# Patient Record
Sex: Male | Born: 1955
Health system: Southern US, Community
[De-identification: ages and names within clinical notes are randomized; demographics above are authoritative.]

## PROBLEM LIST (undated history)

## (undated) DIAGNOSIS — Z298 Encounter for other specified prophylactic measures: Secondary | ICD-10-CM

## (undated) DIAGNOSIS — M5416 Radiculopathy, lumbar region: Secondary | ICD-10-CM

## (undated) DIAGNOSIS — Z944 Liver transplant status: Secondary | ICD-10-CM

## (undated) DIAGNOSIS — R0602 Shortness of breath: Secondary | ICD-10-CM

## (undated) DIAGNOSIS — M5136 Other intervertebral disc degeneration, lumbar region: Secondary | ICD-10-CM

## (undated) DIAGNOSIS — E119 Type 2 diabetes mellitus without complications: Secondary | ICD-10-CM

## (undated) DIAGNOSIS — M4326 Fusion of spine, lumbar region: Secondary | ICD-10-CM

## (undated) DIAGNOSIS — G894 Chronic pain syndrome: Secondary | ICD-10-CM

## (undated) DIAGNOSIS — I10 Essential (primary) hypertension: Secondary | ICD-10-CM

## (undated) DIAGNOSIS — R609 Edema, unspecified: Secondary | ICD-10-CM

## (undated) DIAGNOSIS — E663 Overweight: Secondary | ICD-10-CM

## (undated) DIAGNOSIS — C220 Liver cell carcinoma: Secondary | ICD-10-CM

## (undated) DIAGNOSIS — E876 Hypokalemia: Secondary | ICD-10-CM

## (undated) DIAGNOSIS — H9190 Unspecified hearing loss, unspecified ear: Secondary | ICD-10-CM

## (undated) DIAGNOSIS — K769 Liver disease, unspecified: Secondary | ICD-10-CM

## (undated) DIAGNOSIS — M5410 Radiculopathy, site unspecified: Secondary | ICD-10-CM

## (undated) HISTORY — DX: Chronic pain syndrome: G89.4

## (undated) HISTORY — DX: Type 2 diabetes mellitus without complications: E11.9

## (undated) HISTORY — PX: OTHER SURGICAL HISTORY: SHX169

## (undated) HISTORY — PX: LIVER SURGERY: SHX698

## (undated) HISTORY — PX: HERNIA REPAIR: SHX51

## (undated) HISTORY — PX: EYE SURGERY: SHX253

## (undated) HISTORY — DX: Fusion of spine, lumbar region: M43.26

## (undated) HISTORY — DX: Liver transplant status: Z94.4

## (undated) HISTORY — DX: Essential (primary) hypertension: I10

## (undated) HISTORY — DX: Hypokalemia: E87.6

## (undated) HISTORY — DX: Radiculopathy, site unspecified: M54.10

## (undated) HISTORY — DX: Encounter for other specified prophylactic measures: Z29.8

## (undated) HISTORY — DX: Shortness of breath: R06.02

## (undated) HISTORY — DX: Radiculopathy, lumbar region: M54.16

## (undated) HISTORY — PX: LIVER TRANSPLANT: SHX410

## (undated) HISTORY — DX: Liver cell carcinoma: C22.0

## (undated) HISTORY — PX: TONSILLECTOMY: SUR1361

## (undated) HISTORY — DX: Overweight: E66.3

## (undated) HISTORY — DX: Other intervertebral disc degeneration, lumbar region: M51.36

## (undated) HISTORY — DX: Edema, unspecified: R60.9

## (undated) HISTORY — DX: Unspecified hearing loss, unspecified ear: H91.90

## (undated) HISTORY — PX: BACK SURGERY: SHX140

## (undated) HISTORY — DX: Liver disease, unspecified: K76.9

---

## 2003-07-30 ENCOUNTER — Ambulatory Visit (HOSPITAL_COMMUNITY): Admission: RE | Admit: 2003-07-30 | Discharge: 2003-07-30 | Payer: Self-pay | Admitting: Orthopedic Surgery

## 2003-07-30 ENCOUNTER — Encounter (INDEPENDENT_AMBULATORY_CARE_PROVIDER_SITE_OTHER): Payer: Self-pay | Admitting: Specialist

## 2003-07-30 ENCOUNTER — Ambulatory Visit (HOSPITAL_BASED_OUTPATIENT_CLINIC_OR_DEPARTMENT_OTHER): Admission: RE | Admit: 2003-07-30 | Discharge: 2003-07-30 | Payer: Self-pay | Admitting: Orthopedic Surgery

## 2007-09-20 ENCOUNTER — Other Ambulatory Visit: Admission: RE | Admit: 2007-09-20 | Discharge: 2007-09-20 | Payer: Self-pay | Admitting: Oncology

## 2007-09-20 ENCOUNTER — Encounter (INDEPENDENT_AMBULATORY_CARE_PROVIDER_SITE_OTHER): Payer: Self-pay | Admitting: Oncology

## 2010-07-02 NOTE — Op Note (Signed)
NAME:  Gregory Tanner, HASMAN NO.:  [id number]   MEDICAL RECORD NO.:  [id number]                   PATIENT TYPE:  AMB   LOCATION:  [facility]                                  FACILITY:  [facility]   PHYSICIAN:  Kathalene Frames. Mayer Camel, M.D.                DATE OF BIRTH:  1955-03-19   DATE OF PROCEDURE:  07/30/2003  DATE OF DISCHARGE:                                 OPERATIVE REPORT   PREOPERATIVE DIAGNOSES:  Right knee chondromalacia, lateral meniscal tear  and Baker's cyst.   POSTOPERATIVE DIAGNOSES:  Right knee chondromalacia, lateral meniscal tear  and Baker's cyst.   OPERATION PERFORMED:  Right knee arthroscopic debridement of chondromalacia  from the patella, grade 3 flap tears, trochlea grade 3 flap tears, medial  femoral condyle grade 3 flap tears and debridement of lateral meniscal tear.  This was done in a supine position.  He was then placed prone and we did an  open excision of the Baker's cyst.   SURGEON:  Kathalene Frames. Mayer Camel, M.D.   ASSISTANT:  1. Jodi Geralds, PA student.  2. Lowell Guitar. Mancel Bale, P.A.-C.   ANESTHESIA:  General endotracheal.   ESTIMATED BLOOD LOSS:  Minimal.   FLUIDS REPLACED:  1L crystalloids.   DRAINS:  None.   TOURNIQUET TIME:  30 minutes.   INDICATIONS FOR PROCEDURE:  The patient is a 55 year old man with a rather  large Baker's cyst tracking half way down his calf on MRI scan that has been  symptomatic for pressure and pain for over a year.  He also has some  crepitus and pain in his knee and the MRI scan did show some chondromalacia.  He has failed conservative treatment and desires elective debridement of the  knee and removal of the Baker's cyst open.  He is well aware of the risks  and benefits of surgery.   DESCRIPTION OF PROCEDURE:  The patient was identified by arm band and taken  to the operating room at [facility].  Appropriate  anesthetic monitors were attached.  General endotracheal was induced with  the  patient in the supine position.  Lateral post applied to the table and  the right lower extremity prepped and draped in the usual sterile fashion  from the ankle to the midthigh where a tourniquet was placed.  We began the  procedure by making a standard set of portals inferomedial and inferolateral  to the patella allowing introduction of the arthroscope through the  inferolateral portal and the outflow through the inferomedial portal.  The  pressure was set between 60 and 80 mmHg and diagnostic arthroscopy revealed  grade 3 chondromalacia of the patella with flap tears and trochlea.  It was  debrided with a 3.5 Gator sucker shaver as was chondromalacia of the medial  femoral condyle, likewise fairly global grade 3.  The medial meniscus was  thoroughly probed, no significant tears were noted.  Some small incidental  tears were debrided.  The ACL and the PCL were intact.  Moving to the  lateral side, the patient did have complex tearing of the posterior horn of  the lateral meniscus and this was debrided.  The articular cartilage on the  lateral side was in relatively good shape.  Satisfied with the decompression  of the knee, the knee was irrigated out with normal saline solution and the  arthroscopic instruments were removed.  A temporary dressing of 4 x 4s and  an Ace wrap was then applied.  The patient was undraped, transferred to a  gurney and then placed in a prone position with the tourniquet still in  place and the right lower extremity prepped and draped in the usual sterile  fashion from the ankle to the tourniquet.  The limb was wrapped with an  Esmarch bandage and the tourniquet inflated to 300 mmHg.  We then made a  hockey stick shaped incision starting out a the knee flexion crease  centrally going medially and then distally following the tract of the  Baker's cyst that was clearly seen on the MRI scan.  The incision was  accurate.  We came down right on top of the Baker's cyst  as we cut down  through the subcutaneous tissue and the fascia that invested the medial  gastroc.  We then carefully dissected around the cyst although it did  rupture in a couple of places during the dissection and traced it back to  the hiatus between the semimembranosus and the medial  head of the gastroc.  It was then cut off at its origin and sent as a specimen because there was  some particulate matter inside of the Baker's cyst.  Distally the cyst was  also decompressed and all debris was removed.  The wound was then irrigated  out with normal saline solution and the interval between the semimembranosus  and the posterior capsule was closed with 2-0 Vicryl suture sealing off the  port that the Baker's cyst came out of.  The subcutaneous tissue  was then  closed with running 2-0 Vicryl suture, the skin with running interlocking 3-  0 nylon suture and a dressing of Xeroform, 4 x 4 dressing sponges, Webril  and an Ace wrap applied.  The patient was then rolled supine, awakened and  taken to recovery room without difficulty.   Subcutaneous tissues were closed with 0 and 2-0 undyed Vicryl suture and the  skin with skin staples.  A dressing of Xeroform, 4 x 4 dressing sponges,  Webril and Ace wrap was applied.  The patient was awakened and taken to the  recovery room without difficulty.                                               Kathalene Frames. Mayer Camel, M.D.    Alphonsus Sias  D:  07/30/2003  T:  07/30/2003  Job:  [id number]

## 2010-12-27 DIAGNOSIS — C220 Liver cell carcinoma: Secondary | ICD-10-CM

## 2010-12-27 DIAGNOSIS — E119 Type 2 diabetes mellitus without complications: Secondary | ICD-10-CM

## 2010-12-27 DIAGNOSIS — I10 Essential (primary) hypertension: Secondary | ICD-10-CM

## 2010-12-27 HISTORY — DX: Type 2 diabetes mellitus without complications: E11.9

## 2010-12-27 HISTORY — DX: Liver cell carcinoma: C22.0

## 2010-12-27 HISTORY — DX: Essential (primary) hypertension: I10

## 2011-07-20 DIAGNOSIS — Z944 Liver transplant status: Secondary | ICD-10-CM | POA: Insufficient documentation

## 2011-07-20 HISTORY — DX: Liver transplant status: Z94.4

## 2011-10-10 DIAGNOSIS — Z7189 Other specified counseling: Secondary | ICD-10-CM | POA: Insufficient documentation

## 2011-10-10 DIAGNOSIS — E663 Overweight: Secondary | ICD-10-CM | POA: Insufficient documentation

## 2011-10-10 DIAGNOSIS — Z298 Encounter for other specified prophylactic measures: Secondary | ICD-10-CM | POA: Insufficient documentation

## 2011-10-10 HISTORY — DX: Overweight: E66.3

## 2011-10-10 HISTORY — DX: Encounter for other specified prophylactic measures: Z29.8

## 2012-07-26 DIAGNOSIS — M51369 Other intervertebral disc degeneration, lumbar region without mention of lumbar back pain or lower extremity pain: Secondary | ICD-10-CM | POA: Insufficient documentation

## 2012-07-26 DIAGNOSIS — M5136 Other intervertebral disc degeneration, lumbar region: Secondary | ICD-10-CM

## 2012-07-26 HISTORY — DX: Other intervertebral disc degeneration, lumbar region: M51.36

## 2012-10-10 DIAGNOSIS — M5416 Radiculopathy, lumbar region: Secondary | ICD-10-CM | POA: Insufficient documentation

## 2012-10-10 HISTORY — DX: Radiculopathy, lumbar region: M54.16

## 2013-07-05 DIAGNOSIS — M51369 Other intervertebral disc degeneration, lumbar region without mention of lumbar back pain or lower extremity pain: Secondary | ICD-10-CM

## 2013-07-05 DIAGNOSIS — M5136 Other intervertebral disc degeneration, lumbar region: Secondary | ICD-10-CM | POA: Insufficient documentation

## 2013-07-05 HISTORY — DX: Other intervertebral disc degeneration, lumbar region: M51.36

## 2013-07-05 HISTORY — DX: Other intervertebral disc degeneration, lumbar region without mention of lumbar back pain or lower extremity pain: M51.369

## 2013-08-13 ENCOUNTER — Encounter: Payer: Self-pay | Admitting: Podiatrist

## 2013-08-13 ENCOUNTER — Ambulatory Visit (INDEPENDENT_AMBULATORY_CARE_PROVIDER_SITE_OTHER): Payer: Medicare Other | Admitting: Podiatrist

## 2013-08-13 VITALS — BP 127/76 | HR 76 | Resp 18

## 2013-08-13 DIAGNOSIS — L600 Ingrowing nail: Secondary | ICD-10-CM

## 2013-08-13 MED ORDER — CEPHALEXIN 500 MG PO CAPS: 500.0000 mg | ORAL_CAPSULE | Freq: Three times a day (TID) | ORAL | Status: DC

## 2013-08-13 NOTE — Patient Instructions (Signed)
Soak Instructions    THE DAY AFTER THE PROCEDURE  Place 1/4 cup of epsom salts in a quart of warm tap water.  Submerge your foot or feet with outer bandage intact for the initial soak; this will allow the bandage to become moist and wet for easy lift off.  Once you remove your bandage, continue to soak in the solution for 20 minutes.  This soak should be done twice a day.  Next, remove your foot or feet from solution, blot dry the affected area and cover.  You may use a band aid large enough to cover the area or use gauze and tape.  Apply other medications to the area as directed by the doctor such as polysporin neosporin.  IF YOUR SKIN BECOMES IRRITATED WHILE USING THESE INSTRUCTIONS, IT IS OKAY TO SWITCH TO  WHITE VINEGAR AND WATER. Or you may use antibacterial soap and water to keep the toe clean     ANTIBACTERIAL SOAP INSTRUCTIONS  THE DAY AFTER PROCEDURE   For the first dressing change (soak) Place 3-4 drops of antibacterial liquid soap in a quart of warm tap water.  Submerge foot into water for 20 minutes.  If bandage was applied after your procedure, leave on to allow for easy lift off, then remove and continue with soak for the remaining time.  Next, blot area dry with a soft cloth and apply antibiotic ointment such as polysporin, neosporin, or triple antibiotic ointment.  You may then switch to the instructions below    Shower as usual. Before getting out, place a drop of antibacterial liquid soap (Dial) on a wet, clean washcloth.  Gently wipe washcloth over affected area.  Afterward, rinse the area with warm water.  Blot the area dry with a soft cloth and cover with antibiotic ointment (neosporin, polysporin, bacitracin) and band aid or gauze and tape    Long Term Care Instructions-Post Nail Surgery  You have had your ingrown toenail and root treated with a chemical.  This chemical causes a burn that will drain and ooze like a blister.  1-2 weeks after the procedure you may  leave the area open to air at night to help dry it up.  During the day,  It is important to keep this area clean and covered until the toe dries out and forms a scab. Once the scab forms you no longer need to soak or apply a dressing.  If at any time you experience an increase in pain, redness, swelling, or drainage, you should contact the office as soon as possible.

## 2013-08-13 NOTE — Progress Notes (Signed)
   Subjective:    Patient ID: Gregory Tanner, male    DOB: [birth year], 58 y.o.   MRN: [id number]  HPI I HAVE SOME INGROWNS ON BOTH BIG TOENAILS AND THE ONE ON THE RIGHT I MAY HAVE HIT IT ON SOMETHING DUE TO IT BEING THICK AND DISCOLORED AND THE LEFT BIG TOENAIL MAY BE INGROWN AND HAS BEEN GOING ON FOR A WHILE AND NERVE DAMAGE IN MY LEFT LEG AND SOME NUMBNESS AND I HAVE HAD A LIVER TRANSPLANT IN 2013 AND THERE IS NO DRAINING AND HAVE SOAKED IN EPSOM SALT    Review of Systems  HENT:       RINGING IN EARS   Cardiovascular: Positive for leg swelling.  Musculoskeletal: Positive for back pain.  Neurological: Positive for numbness.  All other systems reviewed and are negative.      Objective:   Physical Exam Patient is awake, alert, and oriented x 3.  In no acute distress.  Vascular status is intact with palpable pedal pulses at 2/4 DP and PT bilateral and capillary refill time within normal limits. Neurological sensation is also intact bilaterally via Semmes Weinstein monofilament at 5/5 sites. Light touch, vibratory sensation, Achilles tendon reflex is intact. Dermatological exam reveals skin color, turger and texture as normal. No open lesions present.  Musculature intact with dorsiflexion, plantarflexion, inversion, eversion.  Right hallux nail is ingrown on the lateral side.  Medial side of the nail is severely thick and has a tubular tooth like appearance to the toenail when excised.  Left hallus nail is slightly ingrown on the medial nail border.  Mildly tender today as well.     Assessment & Plan:  Ingrowing right great toenail,  Left slight paronychia medial side.  Plan:  Treatment options and alternatives discussed.  Recommended permanent phenol matrixectomy and patient agreed.  Right hallux was prepped with alcohol and a 1 to 1 mix of 0.5% marcaine plain and 2% lidocaine plain was administered in a digital block fashion.  The toe was then prepped with betadine solution and  exsanguinated.  The offending nail border was then excised and matrix tissue exposed-- lateral side of the nail is very thick and in a tubular pattern of growth.  Phenol was then applied to the matrix tissue followed by an alcohol wash.  Antibiotic ointment and a dry sterile dressing was applied.  The patient was dispensed instructions for aftercare and antibiotics.  Discussed the lateral part of the nail may grow back some as it is so thick the phenol may not have treated it completely after this procedure.  If that part of the nail starts to grow back he will call.

## 2013-08-14 ENCOUNTER — Ambulatory Visit: Payer: Self-pay

## 2014-02-21 DIAGNOSIS — M4326 Fusion of spine, lumbar region: Secondary | ICD-10-CM | POA: Insufficient documentation

## 2014-02-21 HISTORY — DX: Fusion of spine, lumbar region: M43.26

## 2014-06-05 ENCOUNTER — Encounter: Payer: Self-pay | Admitting: Podiatrist

## 2014-06-05 ENCOUNTER — Ambulatory Visit (INDEPENDENT_AMBULATORY_CARE_PROVIDER_SITE_OTHER): Payer: Medicare Other | Admitting: Podiatrist

## 2014-06-05 VITALS — BP 131/69 | HR 72 | Resp 12

## 2014-06-05 DIAGNOSIS — L600 Ingrowing nail: Secondary | ICD-10-CM

## 2014-06-05 MED ORDER — CEPHALEXIN 500 MG PO CAPS: 500.0000 mg | ORAL_CAPSULE | Freq: Three times a day (TID) | ORAL | Status: DC

## 2014-06-05 NOTE — Patient Instructions (Signed)
Soak Instructions    THE DAY AFTER THE PROCEDURE  Place 1/4 cup of epsom salts in a quart of warm tap water.  Submerge your foot or feet with outer bandage intact for the initial soak; this will allow the bandage to become moist and wet for easy lift off.  Once you remove your bandage, continue to soak in the solution for 20 minutes.  This soak should be done twice a day.  Next, remove your foot or feet from solution, blot dry the affected area and cover.   Apply polysporin then You may use a band aid large enough to cover the area or use gauze and tape.   After the first 3 days of epsom salt soaks, you may switch to the directions below.    ANTIBACTERIAL SOAP INSTRUCTIONS   TShower as usual. Before getting out, place a drop of antibacterial liquid soap (Dial) on a wet, clean washcloth.  Gently wipe washcloth over affected area.  Afterward, rinse the area with warm water.  Blot the area dry with a soft cloth and cover with antibiotic ointment (neosporin, polysporin, bacitracin) and band aid or gauze and tape    Long Term Care Instructions-Post Nail Surgery  You have had your ingrown toenail and root treated with a chemical.  This chemical causes a burn that will drain and ooze like a blister.  1-2 weeks after the procedure you may leave the area open to air at night to help dry it up.  During the day,  It is important to keep this area clean and covered until the toe dries out and forms a scab. Once the scab forms you no longer need to soak or apply a dressing.  If at any time you experience an increase in pain, redness, swelling, or drainage, you should contact the office as soon as possible.

## 2014-06-05 NOTE — Progress Notes (Signed)
   Subjective:    Patient ID: Gregory Tanner, male    DOB: 22-Oct-1955, 59 y.o.   MRN: [id number]  HPI PT STATED RT FOOT 1ST AND LT FOOT 2ND TOENAIL BEEN SORE FOR LESS THAN 1 YEAR. THE TOENAILS ARE WORSE AND GET AGGRAVATED BY PRESSURE. TRIED TO SOAK WITH EPSON SALT.   Review of Systems  Cardiovascular: Positive for leg swelling.  All other systems reviewed and are negative.      Objective:   Physical Exam  Neurovascular status is intact and unchanged. Pedal pulses are palpable at 2/4 DP and PT bilateral neurological sensation also and continues to be intact with some evidence of peripheral neuropathy at the very distal digits. Patient's right hallux nail on the lateral nail border is inflamed and irritated. Medial nail border appears to have healed well from the previous permanent phenol matrixectomy. Left second toe lateral nail border is inflamed and irritated as well. Sign of redness and inflammation and ingrown toenail deformity is noted.    Assessment & Plan:  Ingrown toenail right first lateral nail border; left second lateral nail border  Plan:Treatment options and alternatives discussed.  Recommended permanent phenol matrixectomy and patient agreed.  right hallux and left second toe was prepped with alcohol and a 1 to 1 mix of 0.5% marcaine plain and 2% lidocaine plain was administered in a digital block fashion.  The toe was then prepped with betadine solution and exsanguinated.  The offending nail border was then excised and matrix tissue exposed.  Phenol was then applied to the matrix tissue followed by an alcohol wash.  Antibiotic ointment and a dry sterile dressing was applied.  The patient was dispensed instructions for aftercare.  Epsom salt soaks as well as antibiotics soak instructions were dispensed and a prescription for Keflex was also called in. He'll call if any concerns arise otherwise he'll be seen back as needed.

## 2014-06-12 ENCOUNTER — Encounter: Payer: Self-pay | Admitting: Podiatrist

## 2014-06-19 ENCOUNTER — Encounter: Payer: Self-pay | Admitting: Podiatrist

## 2014-06-19 ENCOUNTER — Ambulatory Visit (INDEPENDENT_AMBULATORY_CARE_PROVIDER_SITE_OTHER): Payer: Medicare Other | Admitting: Podiatrist

## 2014-06-19 ENCOUNTER — Telehealth: Payer: Self-pay | Admitting: Podiatrist

## 2014-06-19 VITALS — BP 135/66 | HR 76 | Resp 18

## 2014-06-19 DIAGNOSIS — L600 Ingrowing nail: Secondary | ICD-10-CM

## 2014-06-19 NOTE — Telephone Encounter (Signed)
I scheduled him to come in at 1:15 today Dr. Valentina Lucks.

## 2014-06-19 NOTE — Telephone Encounter (Signed)
Make him an appt.  I'd like to see his toe.  Thanks!  E

## 2014-06-19 NOTE — Progress Notes (Signed)
Subjective: Patient presents today for follow-up of left second toenail status post permanent phenol matrixectomy on 06/05/2014. He states overall it is doing pretty good however it still bleeding on the lateral side. Denies any pain or drainage to the toe. Denies any pus or purulence.  Objective: Neurovascular status is intact and unchanged with some peripheral neuropathy noted distally. Lateral aspect of the left second toenail is a very superficial area of redness present no active bleeding is noted. No pus or purulence is seen. No redness or swelling is noted. Normal postoperative appearance status post phenol matrixectomy is noted. Right hallux however does have a nail spicule on the medial aspect which was unresolved.  Assessment: Status post phenol matrixectomy  Plan: Applied salinocaine to lateral aspect left second toe and recommended that he keep open to air and allow the toe to dry out. Also removed the spicule on the medial side of the right hallux without complication. He'll call if any concerns or problems arise otherwise he'll be seen back as needed.

## 2014-06-19 NOTE — Telephone Encounter (Signed)
Patient stated he had an ingrown removed a few weeks ago and that it is still seeping blood. He also stated that it has never started to form a scab. Patient is diabetic.

## 2014-09-23 ENCOUNTER — Other Ambulatory Visit: Payer: Self-pay | Admitting: *Deleted

## 2014-09-23 DIAGNOSIS — I83893 Varicose veins of bilateral lower extremities with other complications: Secondary | ICD-10-CM

## 2014-11-13 ENCOUNTER — Encounter: Payer: Self-pay | Admitting: Surgery

## 2014-11-17 ENCOUNTER — Ambulatory Visit (HOSPITAL_COMMUNITY)
Admission: RE | Admit: 2014-11-17 | Discharge: 2014-11-17 | Disposition: A | Discharge status: Home / Self Care | Payer: Medicare Other | Source: Ambulatory Visit | Attending: Surgery | Admitting: Surgery

## 2014-11-17 ENCOUNTER — Encounter: Payer: Self-pay | Admitting: Surgery

## 2014-11-17 ENCOUNTER — Ambulatory Visit (INDEPENDENT_AMBULATORY_CARE_PROVIDER_SITE_OTHER): Payer: Medicare Other | Admitting: Surgery

## 2014-11-17 VITALS — BP 134/69 | HR 68 | Ht 70.0 in | Wt 232.9 lb

## 2014-11-17 DIAGNOSIS — E119 Type 2 diabetes mellitus without complications: Secondary | ICD-10-CM | POA: Diagnosis not present

## 2014-11-17 DIAGNOSIS — I83893 Varicose veins of bilateral lower extremities with other complications: Secondary | ICD-10-CM

## 2014-11-17 DIAGNOSIS — I872 Venous insufficiency (chronic) (peripheral): Secondary | ICD-10-CM

## 2014-11-17 DIAGNOSIS — I10 Essential (primary) hypertension: Secondary | ICD-10-CM | POA: Insufficient documentation

## 2014-11-17 DIAGNOSIS — I8393 Asymptomatic varicose veins of bilateral lower extremities: Secondary | ICD-10-CM | POA: Diagnosis not present

## 2014-11-17 NOTE — Progress Notes (Signed)
Referred by:  Ronita Hipps, MD [address],[place] [id number],  Reason for referral: Bilateral swollen legs.   History of Present Illness  Gregory Tanner is a 59 y.o. (Apr 29, 1955) male who presents with chief complaint: bilateral swollen leg.  Patient notes, onset of swelling for "years." He has noticed progressive worsening of his swelling in the past 2 years. The swelling is worse at the end of the day. The patient denies any pain in his legs. He denies any wounds or ulcers.  The patient has had no history of DVT. He recently underwent DVT and cardiac workup, both of which were negative. He has no known family history of venous disorders. He has worn compression stockings intermittently in the past.   He has a pat medical history of diabetes managed on insulin. He is s/p liver transplant for liver cancer and cirrhosis. He has hypertension managed on a beta blocker. He takes a daily diuretic.   Past Medical History  Diagnosis Date  . Hearing loss   . Swelling   . Diabetes mellitus without complication ([place])   . Hypertension   . Liver disease     Past Surgical History  Procedure Laterality Date  . Liver surgery      IMPLANT  . Back surgery    . Baker cyst surgery      RIGHT LEG  . Hernia repair    . Tonsill surgery    . Liver transplant      Social History   Social History  . Marital Status: Married    Spouse Name: N/A  . Number of Children: N/A  . Years of Education: N/A   Occupational History  . Not on file.   Social History Main Topics  . Smoking status: Never Smoker   . Smokeless tobacco: Never Used  . Alcohol Use: No  . Drug Use: No  . Sexual Activity: Not on file   Other Topics Concern  . Not on file   Social History Narrative    Family History  Problem Relation Age of Onset  . Cancer Mother   . Diabetes Father   . Heart attack Father   . Hypertension Brother     Current Outpatient Prescriptions on File Prior to Visit    Medication Sig Dispense Refill  . aspirin 81 MG tablet Take 81 mg by mouth daily.    . calcium citrate-vitamin D (CITRACAL+D) 315-200 MG-UNIT per tablet Take 1 tablet by mouth 2 (two) times daily. MG IS 250-200MG  UNIT TABLET. LISA    . furosemide (LASIX) 40 MG tablet Take 60 mg by mouth.    Marland Kitchen HUMALOG KWIKPEN 100 UNIT/ML KiwkPen   4  . Insulin Glargine (LANTUS SOLOSTAR) 100 UNIT/ML Solostar Pen Inject into the skin daily at 10 pm.    . magnesium oxide (MAG-OX) 400 MG tablet Take 400 mg by mouth daily.    . Multiple Vitamin (MULTIVITAMIN) tablet Take 1 tablet by mouth daily.    Marland Kitchen omeprazole (PRILOSEC) 20 MG capsule Take 20 mg by mouth daily.    . cephALEXin (KEFLEX) 500 MG capsule Take 1 capsule (500 mg total) by mouth 3 (three) times daily. (Patient not taking: Reported on 11/17/2014) 21 capsule 0  . insulin aspart (NOVOLOG) 100 UNIT/ML injection Inject 100 Units into the skin 3 (three) times daily before meals.    . metFORMIN (GLUCOPHAGE) 500 MG tablet   0  . metoprolol succinate (TOPROL-XL) 25 MG 24 hr tablet  0  . metoprolol tartrate (LOPRESSOR) 25 MG tablet Take 25 mg by mouth 2 (two) times daily.     No current facility-administered medications on file prior to visit.    Allergies  Allergen Reactions  . Iodinated Diagnostic Agents     Contrast dye used before liver transplant cause hives, not sure which dye this was but is able to use others   REVIEW OF SYSTEMS:  (Positives checked otherwise negative)  CARDIOVASCULAR:  []  chest pain, []  chest pressure, []  palpitations, []  shortness of breath when laying flat, []  shortness of breath with exertion,  []  pain in feet when walking, []  pain in feet when laying flat, []  history of blood clot in veins (DVT), []  history of phlebitis, [x]  swelling in legs, [x]  varicose veins  PULMONARY:  []  productive cough, []  asthma, []  wheezing  NEUROLOGIC:  []  weakness in arms or legs, []  numbness in arms or legs, []  difficulty speaking or slurred  speech, []  temporary loss of vision in one eye, []  dizziness  HEMATOLOGIC:  []  bleeding problems, []  problems with blood clotting too easily  MUSCULOSKEL:  []  joint pain, []  joint swelling  GASTROINTEST:  []  vomiting blood, []  blood in stool     GENITOURINARY:  []  burning with urination, []  blood in urine  PSYCHIATRIC:  []  history of major depression  INTEGUMENTARY:  []  rashes, []  ulcers  CONSTITUTIONAL:  []  fever, []  chills   Physical Examination Filed Vitals:   11/17/14 1412  BP: 134/69  Pulse: 68  Height: 5\' 10"  (1.778 m)  Weight: 232 lb 14.4 oz (105.643 kg)  SpO2: 95%   Body mass index is 33.42 kg/(m^2).  General: A&O x 3, WDWN male in NAD  Head: Home/AT  Neck: Supple  Pulmonary: Sym exp, good air movt, CTAB, no rales, rhonchi, & wheezing  Cardiac: RRR, Nl S1, S2, no Murmurs, rubs or gallops  Vascular: 3+ dorsalis pedis pulses bilaterally, 3+ pitting edema lower legs bilaterally, mild hemosiderin staining of lower legs b/l, diffuse tortuous varicosities of legs bilaterally, no ulcers.   Gastrointestinal: soft, NTND  Musculoskeletal: Extremities without ischemic changes  Neurologic: Pain and light touch intact in extremities   Psychiatric: Judgment intact, Mood & affect appropriate for pt's clinical situation  Dermatologic: See M/S exam for extremity exam, no rashes otherwise noted  Non-Invasive Vascular Imaging  BLE Venous Insufficiency Duplex (Date: 11/17/2014):   RLE: negative for  DVT, positive deep venous reflux, positive great saphenous reflux with diameters of 0.58 cm at knee to 1.3 cm at SFJ  LLE: negative for DVT, positive deep venous reflux, positive great saphenous reflux with diameter of 0.57 cm at knee and  1.1 at [facility].   Outside Studies/Documentation 3 pages of outside documents were reviewed including: medical records from PCP.  Medical Decision Making  Gregory Tanner is a 59 y.o. male who presents with: bilateral lower extremity chronic  venous insufficiency    Recommended leg elevation and compression therapy. Believe he is a candidate for laser ablation bilaterally. Given that he does have deep venous reflux as well, ablation would not be curative, but could significantly improve his symptoms.   I discussed with the patient the use of his 20-30 mm thigh high compression stockings and need for 3 month trial of such.  The patient will follow up in 3 months with Dr. Kellie Simmering or Dr. Donnetta Hutching for evaluation for endovenous laser ablation.    Virgina Jock, PA-C [facility] Office: [phone] Pager: [phone]  11/17/2014, 3:21 PM  This patient was seen and examined in conjunction with Dr. Trula Slade   I agree with the above.  I have seen and evaluated the patient.  The patient was placed in 20-30 thigh-high compression stockings with a follow-up appointment in 3 months to discuss possible endovenous laser ablation.  Annamarie Major

## 2015-02-17 ENCOUNTER — Ambulatory Visit: Payer: Medicare Other | Admitting: Vascular Surgery

## 2015-03-05 ENCOUNTER — Telehealth: Payer: Self-pay | Admitting: *Deleted

## 2015-03-05 ENCOUNTER — Encounter: Payer: Self-pay | Admitting: Sports Medicine

## 2015-03-05 ENCOUNTER — Ambulatory Visit (INDEPENDENT_AMBULATORY_CARE_PROVIDER_SITE_OTHER): Payer: Medicare Other | Admitting: Sports Medicine

## 2015-03-05 DIAGNOSIS — L97529 Non-pressure chronic ulcer of other part of left foot with unspecified severity: Secondary | ICD-10-CM | POA: Diagnosis not present

## 2015-03-05 DIAGNOSIS — L03039 Cellulitis of unspecified toe: Secondary | ICD-10-CM | POA: Diagnosis not present

## 2015-03-05 DIAGNOSIS — E11621 Type 2 diabetes mellitus with foot ulcer: Secondary | ICD-10-CM | POA: Diagnosis not present

## 2015-03-05 DIAGNOSIS — E1142 Type 2 diabetes mellitus with diabetic polyneuropathy: Secondary | ICD-10-CM | POA: Diagnosis not present

## 2015-03-05 DIAGNOSIS — L600 Ingrowing nail: Secondary | ICD-10-CM | POA: Diagnosis not present

## 2015-03-05 DIAGNOSIS — IMO0002 Reserved for concepts with insufficient information to code with codable children: Secondary | ICD-10-CM

## 2015-03-05 DIAGNOSIS — M79674 Pain in right toe(s): Secondary | ICD-10-CM

## 2015-03-05 DIAGNOSIS — M79675 Pain in left toe(s): Secondary | ICD-10-CM | POA: Diagnosis not present

## 2015-03-05 MED ORDER — CEPHALEXIN 500 MG PO CAPS: 500.0000 mg | ORAL_CAPSULE | Freq: Three times a day (TID) | ORAL | Status: DC

## 2015-03-05 NOTE — Progress Notes (Signed)
Patient ID: Gregory Tanner, male   DOB: [birth year], 60 y.o.   MRN: [id number] Subjective: Gregory Tanner is a 60 y.o. diabetic male patient presents to office today complaining of a painful incurvated, swollen left medial hallux nail border that he has tried to trim out. Patient also complains of  pus draining from the right medial hallux nail border at site of previous PNA that he try to clear by trimming; patient has tried  dressing with Neosporin with no improvement. Patient denies fever/chills/nausea/vomitting/any other related constitutional symptoms at this time.  FBS ~200  There are no active problems to display for this patient.  Current Outpatient Prescriptions on File Prior to Visit  Medication Sig Dispense Refill  . aspirin 81 MG tablet Take 81 mg by mouth daily.    . calcium citrate-vitamin D (CITRACAL+D) 315-200 MG-UNIT per tablet Take 1 tablet by mouth 2 (two) times daily. MG IS 250-200MG  UNIT TABLET. LISA    . furosemide (LASIX) 20 MG tablet Take by mouth.    . furosemide (LASIX) 40 MG tablet Take 60 mg by mouth.    Marland Kitchen HUMALOG KWIKPEN 100 UNIT/ML KiwkPen   4  . insulin aspart (NOVOLOG) 100 UNIT/ML injection Inject 100 Units into the skin 3 (three) times daily before meals.    . Insulin Glargine (LANTUS SOLOSTAR) 100 UNIT/ML Solostar Pen Inject into the skin daily at 10 pm.    . magnesium oxide (MAG-OX) 400 MG tablet Take 400 mg by mouth daily.    . metFORMIN (GLUCOPHAGE) 500 MG tablet   0  . metoprolol succinate (TOPROL-XL) 25 MG 24 hr tablet   0  . metoprolol tartrate (LOPRESSOR) 25 MG tablet Take 25 mg by mouth 2 (two) times daily.    . Multiple Vitamin (MULTIVITAMIN) tablet Take 1 tablet by mouth daily.    Marland Kitchen omeprazole (PRILOSEC) 20 MG capsule Take 20 mg by mouth daily.    . tacrolimus (PROGRAF) 1 MG capsule Take by mouth.     No current facility-administered medications on file prior to visit.   Allergies  Allergen Reactions  . Iodinated Diagnostic Agents Hives and  Other (See Comments)    Allergy is not to all contrast - but patient is unsure of which particular one his allergy is to. Allergy is not to all contrast - but patient is unsure of which particular one his allergy is to. Contrast dye used before liver transplant cause hives, not sure which dye this was but is able to use others     Objective:   General: Well developed, nourished, in no acute distress, alert and oriented x3   Dermatology: Skin is warm, dry and supple bilateral. Left hallux nail appears to be  moderately incurvated with hyperkeratosis formation at the distal aspects of  the medial  nail border. (+) Erythema. (+) Edema. (-) serosanguous  drainage present.  A the right hallux nail medial margin, There is soft, friable skin and mild focal erythema, Upon the debridement of the soft tissue at  Medial nail fold. There was no active pus or drainage expressed. However, due to Patient subjective complaint of pus over the last 2 days, antibiotic therapy is warranted. The remaining nails appear unremarkable at this time. There are no open sores, lesions or other signs of infection  present.  Vascular: Dorsalis Pedis artery and Posterior Tibial artery pedal pulses are 2/4 bilateral with immedate capillary fill time. Pedal hair growth present. No lower extremity edema.   Neruologic: Grossly intact via light  touch bilateral. Protective and epicritic intact with SWMF. Subjective tingling to toes.   Musculoskeletal: Tenderness to palpation of the bilateral medial hallux nail fold(s). Muscular strength within normal limits in all groups bilateral.   Assesement and Plan: Problem List Items Addressed This Visit    None    Visit Diagnoses    Paronychia, unspecified laterality    -  Primary    Right medial hallux border at site of previous PNA    Relevant Medications    cephALEXin (KEFLEX) 500 MG capsule    Ingrown nail        Left medial hallux border    Toe pain, bilateral         Diabetic peripheral neuropathy associated with type 2 diabetes mellitus (HCC)        Early, subjective tingling in toes       -Discussed treatment alternatives and plan of care - Using a small currette and tissue nipper the right hallux medial nail fold was evacuated and debrided; no active puss or drainage was expressed; dressed area with neosporin and bandaid. Patient was instructed to soak with epsom salt and cover with bandaid daily. - Explained permanent/temporary nail avulsion and post procedure course to patient for Left hallux medial margin;  Patient opted for PNA; After a verbal consent, injected 3 ml of a 50:50 mixture of 2% plain  lidocaine and 0.5% plain marcaine in a normal hallux block fashion on left. Next, a  betadine prep was performed. Anesthesia was tested and found to be appropriate.  The offending Left hallux medial nail border was then incised from the hyponychium to the epinychium. The offending nail border was removed and cleared from the field. The area was curretted for any remaining nail or spicules. Phenol application performed and the area was then flushed with alcohol and dressed with antibiotic cream and a dry sterile dressing. -Patient was instructed to leave the dressing intact for today and begin soaking  in a weak solution of epsom and water tomorrow. Patient was instructed to  soak for 15 minutes each day and apply neosporin and a gauze or bandaid dressing each day. -Patient was instructed to monitor the toe for signs of infection and return to office if toe becomes red, hot or swollen. Rx  Keflex 500 mg 3 times a day for preventative measures in the setting of immunocompromise state patient is a liver transplant patient. -Patient is to return in 1 week for follow up care or sooner if problems arise.  Landis Martins, DPM

## 2015-03-05 NOTE — Telephone Encounter (Signed)
Pt's wife, Olegario Shearer states pt has an infected toe and is diabetic, request appt in [place].

## 2015-03-05 NOTE — Patient Instructions (Signed)
Soak Instructions   THE DAY AFTER THE PROCEDURE  Place 1/4 cup of epsom salt in a quart of warm tap water.  Submerge your foot or feet with outer bandage intact for the initial soak; this will allow the bandage to become moist and wet for easy lift off.  Once you remove your bandage, continue to soak in the solution for 20 minutes.  This soak should be done once to twice a day.  Next, remove your foot or feet from solution, blot dry the affected area and cover.  You may use a band aid large enough to cover the area or use gauze and tape.  Apply other medications to the area as directed by the doctor such as cortisporin otic solution (ear drops) or neosporin.  IF YOUR SKIN BECOMES IRRITATED WHILE USING THESE INSTRUCTIONS, IT IS OKAY TO SWITCH TO BETADINE AND WATER OR WHITE VINEGAR AND WATER.  [facility] Instructions-Post Nail Surgery  You have had your ingrown toenail and root treated with a chemical.  This chemical causes a burn that will drain and ooze like a blister.  This can drain for 6-8 weeks or longer.  It is important to keep this area clean, covered, and follow the soaking instructions dispensed at the time of your surgery.  This area will eventually dry and form a scab.  Once the scab forms you no longer need to soak or apply a dressing.  If at any time you experience an increase in pain, redness, swelling, or drainage, you should contact the office as soon as possible.

## 2015-03-12 ENCOUNTER — Ambulatory Visit (INDEPENDENT_AMBULATORY_CARE_PROVIDER_SITE_OTHER): Payer: Medicare Other | Admitting: Sports Medicine

## 2015-03-12 ENCOUNTER — Encounter: Payer: Self-pay | Admitting: Sports Medicine

## 2015-03-12 DIAGNOSIS — M79675 Pain in left toe(s): Secondary | ICD-10-CM

## 2015-03-12 DIAGNOSIS — IMO0002 Reserved for concepts with insufficient information to code with codable children: Secondary | ICD-10-CM

## 2015-03-12 DIAGNOSIS — L03032 Cellulitis of left toe: Secondary | ICD-10-CM

## 2015-03-12 DIAGNOSIS — B351 Tinea unguium: Secondary | ICD-10-CM | POA: Diagnosis not present

## 2015-03-12 DIAGNOSIS — M79674 Pain in right toe(s): Secondary | ICD-10-CM | POA: Diagnosis not present

## 2015-03-12 DIAGNOSIS — E1142 Type 2 diabetes mellitus with diabetic polyneuropathy: Secondary | ICD-10-CM

## 2015-03-12 DIAGNOSIS — L600 Ingrowing nail: Secondary | ICD-10-CM

## 2015-03-12 NOTE — Progress Notes (Signed)
Patient ID: Gregory Tanner, male   DOB: October 28, 1955, 60 y.o.   MRN: [id number]  Subjective: Gregory Tanner is a 60 y.o. diabetic male patient presents to office today for diabetic nail care and for follow-up evaluation status post left medial hallux PNA performed on 03/05/2015 and status post evacuation paronychia at right medial hallux border at site of previous PNA. Patient denies any pedal complaints. Patient denies fever/chills/nausea/vomitting/any other related constitutional symptoms at this time.  FBS ~170  There are no active problems to display for this patient.  Current Outpatient Prescriptions on File Prior to Visit  Medication Sig Dispense Refill  . aspirin 81 MG tablet Take 81 mg by mouth daily.    . calcium citrate-vitamin D (CITRACAL+D) 315-200 MG-UNIT per tablet Take 1 tablet by mouth 2 (two) times daily. MG IS 250-200MG  UNIT TABLET. LISA    . cephALEXin (KEFLEX) 500 MG capsule Take 1 capsule (500 mg total) by mouth 3 (three) times daily. 30 capsule 0  . furosemide (LASIX) 20 MG tablet Take by mouth.    . furosemide (LASIX) 40 MG tablet Take 60 mg by mouth.    Marland Kitchen HUMALOG KWIKPEN 100 UNIT/ML KiwkPen   4  . insulin aspart (NOVOLOG) 100 UNIT/ML injection Inject 100 Units into the skin 3 (three) times daily before meals.    . Insulin Glargine (LANTUS SOLOSTAR) 100 UNIT/ML Solostar Pen Inject into the skin daily at 10 pm.    . magnesium oxide (MAG-OX) 400 MG tablet Take 400 mg by mouth daily.    . metFORMIN (GLUCOPHAGE) 500 MG tablet   0  . metoprolol succinate (TOPROL-XL) 25 MG 24 hr tablet   0  . metoprolol tartrate (LOPRESSOR) 25 MG tablet Take 25 mg by mouth 2 (two) times daily.    . Multiple Vitamin (MULTIVITAMIN) tablet Take 1 tablet by mouth daily.    Marland Kitchen omeprazole (PRILOSEC) 20 MG capsule Take 20 mg by mouth daily.    . tacrolimus (PROGRAF) 1 MG capsule Take by mouth.     No current facility-administered medications on file prior to visit.   Allergies  Allergen  Reactions  . Iodinated Diagnostic Agents Hives and Other (See Comments)    Allergy is not to all contrast - but patient is unsure of which particular one his allergy is to. Allergy is not to all contrast - but patient is unsure of which particular one his allergy is to. Contrast dye used before liver transplant cause hives, not sure which dye this was but is able to use others     Objective:   General: Well developed, nourished, in no acute distress, alert and oriented x3   Dermatology: Skin is warm, dry and supple bilateral. Left medial hallux nail border appears to have dry granular debris with no signs of infection, right medial hallux nail border is mild maceration, granular tissue with no surrounding signs of infection. (-) Erythema. (-) Edema. (-) serosanguous drainage present. All remaining nails are mildly elongated, mildly discolored with mild subungual debris, consistent with mycosis. Remaining integument unremarkable.  There are no open sores, lesions or other signs of infection present.  Vascular: Dorsalis Pedis artery and Posterior Tibial artery pedal pulses are 2/4 bilateral with immedate capillary fill time. Pedal hair growth present. No lower extremity edema.   Neruologic: Grossly intact via light touch bilateral. Protective and epicritic intact with SWMF. Subjective tingling to toes.   Musculoskeletal: No Tenderness to palpation of the bilateral medial hallux nail fold(s). Muscular strength within normal  limits in all groups bilateral. No bony structural deformities noted bilateral.  Assesement and Plan: Problem List Items Addressed This Visit    None    Visit Diagnoses    Paronychia, unspecified laterality    -  Primary    Ingrown nail        Dermatophytosis of nail        Toe pain, bilateral        Diabetic peripheral neuropathy associated with type 2 diabetes mellitus ([place])          -Patient seen and examined -Discussed long term plan of care for nails -Patient  to continue soaking with Epson salt until the area heals or scabs over. To cover during day with Band-Aid and leave open to air at night until area scabs/heals over. Advised patient to watch for recurrent infection or worsening symptoms. Patient also finished Keflex 500 mg as prescribed -Educated patient on diabetic foot care and glycemic control -Mechanicallly debrided nails 2 through 5 bilateral using sterile nail nipper without complications -Patient is to return in 3 months for diabetic foot care or sooner if problems arise.  Landis Martins, DPM

## 2015-03-19 DIAGNOSIS — L97929 Non-pressure chronic ulcer of unspecified part of left lower leg with unspecified severity: Secondary | ICD-10-CM | POA: Diagnosis not present

## 2015-03-19 DIAGNOSIS — Z1389 Encounter for screening for other disorder: Secondary | ICD-10-CM | POA: Diagnosis not present

## 2015-04-03 DIAGNOSIS — K5900 Constipation, unspecified: Secondary | ICD-10-CM | POA: Diagnosis not present

## 2015-04-03 DIAGNOSIS — S80812S Abrasion, left lower leg, sequela: Secondary | ICD-10-CM | POA: Diagnosis not present

## 2015-04-07 DIAGNOSIS — Z944 Liver transplant status: Secondary | ICD-10-CM | POA: Diagnosis not present

## 2015-04-07 DIAGNOSIS — E1165 Type 2 diabetes mellitus with hyperglycemia: Secondary | ICD-10-CM | POA: Diagnosis not present

## 2015-04-07 DIAGNOSIS — Z794 Long term (current) use of insulin: Secondary | ICD-10-CM | POA: Diagnosis not present

## 2015-04-07 DIAGNOSIS — L03116 Cellulitis of left lower limb: Secondary | ICD-10-CM | POA: Diagnosis not present

## 2015-04-07 DIAGNOSIS — M7989 Other specified soft tissue disorders: Secondary | ICD-10-CM | POA: Diagnosis not present

## 2015-04-07 DIAGNOSIS — E049 Nontoxic goiter, unspecified: Secondary | ICD-10-CM | POA: Diagnosis not present

## 2015-04-10 DIAGNOSIS — E042 Nontoxic multinodular goiter: Secondary | ICD-10-CM | POA: Diagnosis not present

## 2015-04-14 DIAGNOSIS — I10 Essential (primary) hypertension: Secondary | ICD-10-CM | POA: Diagnosis not present

## 2015-04-14 DIAGNOSIS — E1165 Type 2 diabetes mellitus with hyperglycemia: Secondary | ICD-10-CM | POA: Diagnosis not present

## 2015-04-14 DIAGNOSIS — Z794 Long term (current) use of insulin: Secondary | ICD-10-CM | POA: Diagnosis not present

## 2015-04-27 DIAGNOSIS — Z794 Long term (current) use of insulin: Secondary | ICD-10-CM | POA: Diagnosis not present

## 2015-04-27 DIAGNOSIS — D899 Disorder involving the immune mechanism, unspecified: Secondary | ICD-10-CM | POA: Diagnosis not present

## 2015-04-27 DIAGNOSIS — K7469 Other cirrhosis of liver: Secondary | ICD-10-CM | POA: Diagnosis not present

## 2015-04-27 DIAGNOSIS — Z944 Liver transplant status: Secondary | ICD-10-CM | POA: Diagnosis not present

## 2015-04-27 DIAGNOSIS — Z4823 Encounter for aftercare following liver transplant: Secondary | ICD-10-CM | POA: Diagnosis not present

## 2015-04-27 DIAGNOSIS — Z23 Encounter for immunization: Secondary | ICD-10-CM | POA: Diagnosis not present

## 2015-04-27 DIAGNOSIS — Z7982 Long term (current) use of aspirin: Secondary | ICD-10-CM | POA: Diagnosis not present

## 2015-05-12 DIAGNOSIS — I10 Essential (primary) hypertension: Secondary | ICD-10-CM | POA: Diagnosis not present

## 2015-05-12 DIAGNOSIS — Z13828 Encounter for screening for other musculoskeletal disorder: Secondary | ICD-10-CM | POA: Diagnosis not present

## 2015-05-12 DIAGNOSIS — Z944 Liver transplant status: Secondary | ICD-10-CM | POA: Diagnosis not present

## 2015-05-18 DIAGNOSIS — Z944 Liver transplant status: Secondary | ICD-10-CM | POA: Diagnosis not present

## 2015-05-18 DIAGNOSIS — Z418 Encounter for other procedures for purposes other than remedying health state: Secondary | ICD-10-CM | POA: Diagnosis not present

## 2015-06-11 ENCOUNTER — Ambulatory Visit: Payer: Medicare Other | Admitting: Sports Medicine

## 2015-06-17 ENCOUNTER — Encounter: Payer: Self-pay | Admitting: Sports Medicine

## 2015-06-17 ENCOUNTER — Ambulatory Visit (INDEPENDENT_AMBULATORY_CARE_PROVIDER_SITE_OTHER): Payer: Medicare Other | Admitting: Sports Medicine

## 2015-06-17 DIAGNOSIS — M5410 Radiculopathy, site unspecified: Secondary | ICD-10-CM

## 2015-06-17 DIAGNOSIS — E1142 Type 2 diabetes mellitus with diabetic polyneuropathy: Secondary | ICD-10-CM

## 2015-06-17 DIAGNOSIS — M79675 Pain in left toe(s): Secondary | ICD-10-CM | POA: Diagnosis not present

## 2015-06-17 DIAGNOSIS — G894 Chronic pain syndrome: Secondary | ICD-10-CM

## 2015-06-17 DIAGNOSIS — L600 Ingrowing nail: Secondary | ICD-10-CM | POA: Diagnosis not present

## 2015-06-17 HISTORY — DX: Chronic pain syndrome: G89.4

## 2015-06-17 HISTORY — DX: Radiculopathy, site unspecified: M54.10

## 2015-06-17 MED ORDER — AMOXICILLIN-POT CLAVULANATE 875-125 MG PO TABS: 1.0000 | ORAL_TABLET | Freq: Two times a day (BID) | ORAL | Status: DC

## 2015-06-17 NOTE — Patient Instructions (Addendum)
Betadine Soak Instructions  Purchase an 8 oz. bottle of BETADINE solution (Povidone)  THE DAY AFTER THE PROCEDURE  Place 1 tablespoon of betadine solution in a quart of warm tap water.  Submerge your foot or feet with outer bandage intact for the initial soak; this will allow the bandage to become moist and wet for easy lift off.  Once you remove your bandage, continue to soak in the solution for 20 minutes.  This soak should be done twice a day.  Next, remove your foot or feet from solution, blot dry the affected area and cover.  You may use a band aid large enough to cover the area or use gauze and tape.  Apply other medications to the area as directed by the doctor such as cortisporin otic solution (ear drops) or neosporin.   IF YOUR SKIN BECOMES IRRITATED WHILE USING THESE INSTRUCTIONS, IT IS OKAY TO SWITCH TO EPSOM SALTS AND WATER.   Place 1/4 cup of epsom salts in a quart of warm tap water.  Submerge your foot or feet with outer bandage intact for the initial soak; this will allow the bandage to become moist and wet for easy lift off.  Once you remove your bandage, continue to soak in the solution for 20 minutes.  This soak should be done twice a day.  Next, remove your foot or feet from solution, blot dry the affected area and cover.  You may use a band aid large enough to cover the area or use gauze and tape.  Apply other medications to the area as directed by the doctor such as polysporin neosporin.  IF YOUR SKIN BECOMES IRRITATED WHILE USING THESE INSTRUCTIONS, IT IS OKAY TO SWITCH TO  WHITE VINEGAR AND WATER.    [facility] Instructions-Post Nail Surgery  You have had your ingrown toenail and root treated with a chemical.  This chemical causes a burn that will drain and ooze like a blister.  This can drain for 6-8 weeks or longer.  It is important to keep this area clean, covered, and follow the soaking instructions dispensed at the time of your surgery.  This area will eventually dry  and form a scab.  Once the scab forms you no longer need to soak or apply a dressing.  If at any time you experience an increase in pain, redness, swelling, or drainage, you should contact the office as soon as possible.   Monitor for any signs/symptoms of infection. Call the office immediately if any occur or go directly to the emergency room. Call with any questions/concerns.

## 2015-06-17 NOTE — Progress Notes (Signed)
Patient ID: Gregory Tanner, male   DOB: [birth year], 60 y.o.   MRN: [id number] Subjective: Gregory Tanner is a 60 y.o.  diabetic  male patient presents to office today complaining of a painful incurvated, red, swollen medial nail border of the 1st toe on the left foot. This has been present for last few weeks with puss; went to PCP who gave bactroban to use to area and antibiotics which cause a little bit of upset stomach. However, he has completed all antibiotics at this time. Patient had a PNA to this site previously on 03-05-15. Patient denies fever/chills/nausea/vomitting/any other related constitutional symptoms at this time.  A1C 6.6  Patient Active Problem List   Diagnosis Date Noted  . Chronic pain associated with significant psychosocial dysfunction 06/17/2015  . Nerve root pain 06/17/2015  . Ankylosis of lumbar spine 02/21/2014  . Degeneration of intervertebral disc of lumbar region 07/05/2013  . Lumbar radiculopathy 10/10/2012  . Encounter for other specified prophylactic measures 10/10/2011  . Excess weight 10/10/2011  . History of liver transplant ([place]) 07/20/2011  . Type 2 diabetes mellitus ([place]) 12/27/2010  . Hepatocellular carcinoma ([place]) 12/27/2010  . BP (high blood pressure) 12/27/2010    Current Outpatient Prescriptions on File Prior to Visit  Medication Sig Dispense Refill  . aspirin 81 MG tablet Take 81 mg by mouth daily.    . calcium citrate-vitamin D (CITRACAL+D) 315-200 MG-UNIT per tablet Take 1 tablet by mouth 2 (two) times daily. MG IS 250-200MG  UNIT TABLET. LISA    . cephALEXin (KEFLEX) 500 MG capsule Take 1 capsule (500 mg total) by mouth 3 (three) times daily. 30 capsule 0  . furosemide (LASIX) 20 MG tablet Take by mouth.    . furosemide (LASIX) 40 MG tablet Take 60 mg by mouth.    Marland Kitchen HUMALOG KWIKPEN 100 UNIT/ML KiwkPen   4  . insulin aspart (NOVOLOG) 100 UNIT/ML injection Inject 100 Units into the skin 3 (three) times daily before meals.    . Insulin  Glargine (LANTUS SOLOSTAR) 100 UNIT/ML Solostar Pen Inject into the skin daily at 10 pm.    . magnesium oxide (MAG-OX) 400 MG tablet Take 400 mg by mouth daily.    . metFORMIN (GLUCOPHAGE) 500 MG tablet   0  . metoprolol succinate (TOPROL-XL) 25 MG 24 hr tablet   0  . metoprolol tartrate (LOPRESSOR) 25 MG tablet Take 25 mg by mouth 2 (two) times daily.    . Multiple Vitamin (MULTIVITAMIN) tablet Take 1 tablet by mouth daily.    Marland Kitchen omeprazole (PRILOSEC) 20 MG capsule Take 20 mg by mouth daily.    . tacrolimus (PROGRAF) 1 MG capsule Take by mouth.     No current facility-administered medications on file prior to visit.    Allergies  Allergen Reactions  . Iodinated Diagnostic Agents Hives and Other (See Comments)    Allergy is not to all contrast - but patient is unsure of which particular one his allergy is to. Allergy is not to all contrast - but patient is unsure of which particular one his allergy is to. Contrast dye used before liver transplant cause hives, not sure which dye this was but is able to use others  . Latex Other (See Comments)    Skin peeling    Objective:  There were no vitals filed for this visit.  General: Well developed, nourished, in no acute distress, alert and oriented x3   Dermatology: Skin is warm, dry and supple bilateral.Left hallux nail appears  to be  severely incurvated with hyperkeratosis formation at the distal aspects of  the medial lateral nail border. (+) Erythema. (+) Edema. (-) serosanguous  drainage present. The remaining nails appear unremarkable at this time free from ingrowing. However, all nails are mildly discolored with mild subungual debris, consistent with mycosis. There are no open sores, lesions or other signs of infection present.  Vascular: Dorsalis Pedis artery and Posterior Tibial artery pedal pulses are 2/4 bilateral with immedate capillary fill time. Pedal hair growth present. No lower extremity edema.   Neruologic: Grossly intact  via light touch bilateral. Protective and epicritic sensation intact with Semmes Weinstein monofilament subjective tingling to toes, unchanged from prior  Musculoskeletal: Tenderness to palpation of the left hallux medial nail fold. Muscular strength within normal limits in all groups bilateral.   Assesement and Plan: Problem List Items Addressed This Visit    None    Visit Diagnoses    Ingrown nail    -  Primary    recurrent at Left medial hallux     Relevant Medications    amoxicillin-clavulanate (AUGMENTIN) 875-125 MG tablet    Toe pain, left        Diabetic peripheral neuropathy associated with type 2 diabetes mellitus ([place])          -Discussed treatment alternatives and plan of care; Explained permanent/temporary nail avulsion and post procedure course to patient. Patient opt for repeat PNA at left hallux medial nail margin. Advised patient in the setting infection, PNA may fail. Thus, possibly requiring a repeat procedure in the future if no resolution of ingrown.  - After a verbal consent, injected 3 ml of a 50:50 mixture of 2% plain  lidocaine and 0.5% plain marcaine in a normal hallux block fashion. Next, a  betadine prep was performed. Anesthesia was tested and found to be appropriate.  The offending left hallux medial nail border was then incised from the hyponychium to the epinychium. The offending nail border was removed and cleared from the field. The area was curretted for any remaining nail or spicules. Phenol application performed and the area was then flushed with alcohol and dressed with antibiotic cream and a dry sterile dressing. -Patient was instructed to leave the dressing intact for today and begin soaking  in a weak solution of betadine and water tomorrow. Patient was instructed to  soak for 15 minutes each day and apply Bactroban and a gauze or bandaid dressing each day. -Patient was instructed to monitor the toe for signs of infection and return to office if toe  becomes red, hot or swollen. -Prescribed Augmentin for preventative measures in the setting of local erythema and swelling to the left hallux medial nail fold -Advised ice, elevation, and tylenol or motrin if needed for pain.  -Advised close inspection and daily monitoring in the setting of diabetes -Patient is to return in 1 week for follow up care/nail check or sooner if problems arise.  Landis Martins, DPM

## 2015-06-25 ENCOUNTER — Ambulatory Visit (INDEPENDENT_AMBULATORY_CARE_PROVIDER_SITE_OTHER): Payer: Medicare Other | Admitting: Sports Medicine

## 2015-06-25 ENCOUNTER — Encounter: Payer: Self-pay | Admitting: Sports Medicine

## 2015-06-25 DIAGNOSIS — Z9889 Other specified postprocedural states: Secondary | ICD-10-CM

## 2015-06-25 DIAGNOSIS — M79675 Pain in left toe(s): Secondary | ICD-10-CM

## 2015-06-25 DIAGNOSIS — E1142 Type 2 diabetes mellitus with diabetic polyneuropathy: Secondary | ICD-10-CM

## 2015-06-25 NOTE — Progress Notes (Signed)
Patient ID: Gregory Tanner, male   DOB: 1955/06/10, 60 y.o.   MRN: [id number] Subjective: Gregory Tanner is a 60 y.o. Diabetic male patient returns to office today for follow up evaluation after having Left Hallux medial permanent nail avulsion performed on 06-17-15. Patient has been soaking using betadine and applying topical antibiotic covered with bandaid daily. Patient deniesfever/chills/nausea/vomitting/any other related constitutional symptoms at this time.  Patient Active Problem List   Diagnosis Date Noted  . Chronic pain associated with significant psychosocial dysfunction 06/17/2015  . Nerve root pain 06/17/2015  . Ankylosis of lumbar spine 02/21/2014  . Degeneration of intervertebral disc of lumbar region 07/05/2013  . Lumbar radiculopathy 10/10/2012  . Encounter for other specified prophylactic measures 10/10/2011  . Excess weight 10/10/2011  . History of liver transplant ([place]) 07/20/2011  . Type 2 diabetes mellitus ([place]) 12/27/2010  . Hepatocellular carcinoma ([place]) 12/27/2010  . BP (high blood pressure) 12/27/2010    Current Outpatient Prescriptions on File Prior to Visit  Medication Sig Dispense Refill  . amoxicillin-clavulanate (AUGMENTIN) 875-125 MG tablet Take 1 tablet by mouth 2 (two) times daily. 20 tablet 0  . aspirin 81 MG tablet Take 81 mg by mouth daily.    . calcium citrate-vitamin D (CITRACAL+D) 315-200 MG-UNIT per tablet Take 1 tablet by mouth 2 (two) times daily. MG IS 250-200MG  UNIT TABLET. LISA    . cephALEXin (KEFLEX) 500 MG capsule Take 1 capsule (500 mg total) by mouth 3 (three) times daily. 30 capsule 0  . furosemide (LASIX) 20 MG tablet Take by mouth.    . furosemide (LASIX) 40 MG tablet Take 60 mg by mouth.    Marland Kitchen HUMALOG KWIKPEN 100 UNIT/ML KiwkPen   4  . insulin aspart (NOVOLOG) 100 UNIT/ML injection Inject 100 Units into the skin 3 (three) times daily before meals.    . Insulin Glargine (LANTUS SOLOSTAR) 100 UNIT/ML Solostar Pen Inject into the skin  daily at 10 pm.    . magnesium oxide (MAG-OX) 400 MG tablet Take 400 mg by mouth daily.    . metFORMIN (GLUCOPHAGE) 500 MG tablet   0  . metoprolol succinate (TOPROL-XL) 25 MG 24 hr tablet   0  . metoprolol tartrate (LOPRESSOR) 25 MG tablet Take 25 mg by mouth 2 (two) times daily.    . Multiple Vitamin (MULTIVITAMIN) tablet Take 1 tablet by mouth daily.    Marland Kitchen omeprazole (PRILOSEC) 20 MG capsule Take 20 mg by mouth daily.    . tacrolimus (PROGRAF) 1 MG capsule Take by mouth.     No current facility-administered medications on file prior to visit.    Allergies  Allergen Reactions  . Iodinated Diagnostic Agents Hives and Other (See Comments)    Allergy is not to all contrast - but patient is unsure of which particular one his allergy is to. Allergy is not to all contrast - but patient is unsure of which particular one his allergy is to. Contrast dye used before liver transplant cause hives, not sure which dye this was but is able to use others  . Latex Other (See Comments)    Skin peeling    Objective:  General: Well developed, nourished, in no acute distress, alert and oriented x3   Dermatology: Skin is warm, dry and supple bilateral. Left hallux medial nail bed appears to be clean, dry, with mild granular tissue and surrounding eschar/scab. (-) Erythema. (-) Edema. (-) serosanguous drainage present. The remaining nails appear unremarkable at this time. There are no other  lesions or other signs of infection present.  Neurovascular status:Unchanged from prior  Musculoskeletal: Decreased tenderness to palpation of the left hallux medial nail fold. Muscular strength within normal limits bilateral.   Assesement and Plan: Problem List Items Addressed This Visit    None    Visit Diagnoses    S/P nail surgery    -  Primary    Repeat PNA at Left medial hallux 06-17-15    Toe pain, left        Diabetic peripheral neuropathy associated with type 2 diabetes mellitus ([place])            -Examined patient  -Cleansed left hallux medial nail fold and gently scrubbed with peroxide and curretted away eschar at site and applied antibiotic cream covered with bandaid.  -Discussed plan of care with patient. -Patient to now begin soaking in a weak solution of Epsom salt and warm water. Encouraged patient to use towel or Q-tip to make sure that excessive scabbing does not buildup at area. Patient was instructed to soak for 15-20 minutes each day until the toe appears normal and there is no drainage, redness, tenderness, or swelling at the procedure site, and apply neosporin and a gauze or bandaid dressing each day as needed. May leave open to air at night. -Educated patient on long term care after nail surgery. -Patient was instructed to monitor the toe for reoccurrence and signs of infection; Patient advised to return to office or go to ER if toe becomes red, hot or swollen. -Patient is to return in 3 months for diabetic foot exam and nail trim or sooner if problems arise.  Landis Martins, DPM

## 2015-07-08 DIAGNOSIS — Z944 Liver transplant status: Secondary | ICD-10-CM | POA: Diagnosis not present

## 2015-07-08 DIAGNOSIS — E1165 Type 2 diabetes mellitus with hyperglycemia: Secondary | ICD-10-CM | POA: Diagnosis not present

## 2015-07-08 DIAGNOSIS — Z298 Encounter for other specified prophylactic measures: Secondary | ICD-10-CM | POA: Diagnosis not present

## 2015-07-08 DIAGNOSIS — Z794 Long term (current) use of insulin: Secondary | ICD-10-CM | POA: Diagnosis not present

## 2015-07-14 DIAGNOSIS — E1165 Type 2 diabetes mellitus with hyperglycemia: Secondary | ICD-10-CM | POA: Diagnosis not present

## 2015-07-14 DIAGNOSIS — Z794 Long term (current) use of insulin: Secondary | ICD-10-CM | POA: Diagnosis not present

## 2015-08-28 DIAGNOSIS — Z944 Liver transplant status: Secondary | ICD-10-CM | POA: Diagnosis not present

## 2015-08-28 DIAGNOSIS — Z418 Encounter for other procedures for purposes other than remedying health state: Secondary | ICD-10-CM | POA: Diagnosis not present

## 2015-09-30 ENCOUNTER — Ambulatory Visit: Payer: Medicare Other | Admitting: Sports Medicine

## 2015-10-07 DIAGNOSIS — R0601 Orthopnea: Secondary | ICD-10-CM | POA: Diagnosis not present

## 2015-10-07 DIAGNOSIS — R0683 Snoring: Secondary | ICD-10-CM | POA: Diagnosis not present

## 2015-10-07 DIAGNOSIS — R0602 Shortness of breath: Secondary | ICD-10-CM | POA: Diagnosis not present

## 2015-10-07 DIAGNOSIS — Z794 Long term (current) use of insulin: Secondary | ICD-10-CM | POA: Diagnosis not present

## 2015-10-07 DIAGNOSIS — Z7982 Long term (current) use of aspirin: Secondary | ICD-10-CM | POA: Diagnosis not present

## 2015-10-07 DIAGNOSIS — E119 Type 2 diabetes mellitus without complications: Secondary | ICD-10-CM | POA: Diagnosis not present

## 2015-10-07 DIAGNOSIS — Z944 Liver transplant status: Secondary | ICD-10-CM | POA: Diagnosis not present

## 2015-10-07 DIAGNOSIS — R918 Other nonspecific abnormal finding of lung field: Secondary | ICD-10-CM | POA: Diagnosis not present

## 2015-10-07 DIAGNOSIS — Z79899 Other long term (current) drug therapy: Secondary | ICD-10-CM | POA: Diagnosis not present

## 2015-10-07 DIAGNOSIS — Z87898 Personal history of other specified conditions: Secondary | ICD-10-CM | POA: Diagnosis not present

## 2015-10-08 DIAGNOSIS — R0601 Orthopnea: Secondary | ICD-10-CM | POA: Diagnosis not present

## 2015-10-08 DIAGNOSIS — E1121 Type 2 diabetes mellitus with diabetic nephropathy: Secondary | ICD-10-CM | POA: Diagnosis not present

## 2015-10-13 DIAGNOSIS — I081 Rheumatic disorders of both mitral and tricuspid valves: Secondary | ICD-10-CM | POA: Diagnosis not present

## 2015-10-13 DIAGNOSIS — I517 Cardiomegaly: Secondary | ICD-10-CM | POA: Diagnosis not present

## 2015-10-13 DIAGNOSIS — R0601 Orthopnea: Secondary | ICD-10-CM | POA: Diagnosis not present

## 2015-10-14 DIAGNOSIS — E1165 Type 2 diabetes mellitus with hyperglycemia: Secondary | ICD-10-CM | POA: Diagnosis not present

## 2015-10-14 DIAGNOSIS — Z794 Long term (current) use of insulin: Secondary | ICD-10-CM | POA: Diagnosis not present

## 2015-10-15 DIAGNOSIS — Z794 Long term (current) use of insulin: Secondary | ICD-10-CM | POA: Diagnosis not present

## 2015-10-15 DIAGNOSIS — E1165 Type 2 diabetes mellitus with hyperglycemia: Secondary | ICD-10-CM | POA: Diagnosis not present

## 2015-10-26 DIAGNOSIS — I110 Hypertensive heart disease with heart failure: Secondary | ICD-10-CM | POA: Insufficient documentation

## 2015-10-26 DIAGNOSIS — I10 Essential (primary) hypertension: Secondary | ICD-10-CM

## 2015-10-26 HISTORY — DX: Hypertensive heart disease with heart failure: I11.0

## 2015-10-26 HISTORY — DX: Essential (primary) hypertension: I10

## 2015-10-28 DIAGNOSIS — G894 Chronic pain syndrome: Secondary | ICD-10-CM

## 2015-10-28 DIAGNOSIS — R0602 Shortness of breath: Secondary | ICD-10-CM | POA: Insufficient documentation

## 2015-10-28 HISTORY — DX: Chronic pain syndrome: G89.4

## 2015-10-28 HISTORY — DX: Shortness of breath: R06.02

## 2015-10-29 DIAGNOSIS — I272 Other secondary pulmonary hypertension: Secondary | ICD-10-CM | POA: Diagnosis not present

## 2015-10-29 DIAGNOSIS — R0602 Shortness of breath: Secondary | ICD-10-CM | POA: Diagnosis not present

## 2015-11-05 DIAGNOSIS — I5032 Chronic diastolic (congestive) heart failure: Secondary | ICD-10-CM | POA: Diagnosis not present

## 2015-11-06 DIAGNOSIS — Z23 Encounter for immunization: Secondary | ICD-10-CM | POA: Diagnosis not present

## 2015-11-30 DIAGNOSIS — Z944 Liver transplant status: Secondary | ICD-10-CM | POA: Diagnosis not present

## 2015-11-30 DIAGNOSIS — Z418 Encounter for other procedures for purposes other than remedying health state: Secondary | ICD-10-CM | POA: Diagnosis not present

## 2015-12-08 DIAGNOSIS — M109 Gout, unspecified: Secondary | ICD-10-CM | POA: Diagnosis not present

## 2015-12-21 DIAGNOSIS — I5032 Chronic diastolic (congestive) heart failure: Secondary | ICD-10-CM | POA: Diagnosis not present

## 2015-12-21 DIAGNOSIS — I10 Essential (primary) hypertension: Secondary | ICD-10-CM | POA: Diagnosis not present

## 2015-12-28 DIAGNOSIS — I5032 Chronic diastolic (congestive) heart failure: Secondary | ICD-10-CM | POA: Diagnosis not present

## 2016-01-13 DIAGNOSIS — E1165 Type 2 diabetes mellitus with hyperglycemia: Secondary | ICD-10-CM | POA: Diagnosis not present

## 2016-01-13 DIAGNOSIS — Z794 Long term (current) use of insulin: Secondary | ICD-10-CM | POA: Diagnosis not present

## 2016-01-14 DIAGNOSIS — Z794 Long term (current) use of insulin: Secondary | ICD-10-CM | POA: Diagnosis not present

## 2016-01-14 DIAGNOSIS — H52213 Irregular astigmatism, bilateral: Secondary | ICD-10-CM | POA: Diagnosis not present

## 2016-01-14 DIAGNOSIS — E1165 Type 2 diabetes mellitus with hyperglycemia: Secondary | ICD-10-CM | POA: Diagnosis not present

## 2016-01-25 DIAGNOSIS — I5032 Chronic diastolic (congestive) heart failure: Secondary | ICD-10-CM | POA: Diagnosis not present

## 2016-02-03 DIAGNOSIS — R29898 Other symptoms and signs involving the musculoskeletal system: Secondary | ICD-10-CM | POA: Diagnosis not present

## 2016-02-03 DIAGNOSIS — E119 Type 2 diabetes mellitus without complications: Secondary | ICD-10-CM | POA: Diagnosis not present

## 2016-02-03 DIAGNOSIS — M4316 Spondylolisthesis, lumbar region: Secondary | ICD-10-CM | POA: Diagnosis not present

## 2016-02-03 DIAGNOSIS — Z944 Liver transplant status: Secondary | ICD-10-CM | POA: Diagnosis not present

## 2016-02-03 DIAGNOSIS — M545 Low back pain: Secondary | ICD-10-CM | POA: Diagnosis not present

## 2016-02-10 DIAGNOSIS — M109 Gout, unspecified: Secondary | ICD-10-CM | POA: Diagnosis not present

## 2016-03-01 DIAGNOSIS — Z79899 Other long term (current) drug therapy: Secondary | ICD-10-CM | POA: Diagnosis not present

## 2016-03-15 DIAGNOSIS — I5032 Chronic diastolic (congestive) heart failure: Secondary | ICD-10-CM | POA: Diagnosis not present

## 2016-03-15 DIAGNOSIS — I10 Essential (primary) hypertension: Secondary | ICD-10-CM | POA: Diagnosis not present

## 2016-03-16 DIAGNOSIS — M109 Gout, unspecified: Secondary | ICD-10-CM | POA: Diagnosis not present

## 2016-03-29 DIAGNOSIS — E119 Type 2 diabetes mellitus without complications: Secondary | ICD-10-CM | POA: Diagnosis not present

## 2016-03-30 DIAGNOSIS — M109 Gout, unspecified: Secondary | ICD-10-CM | POA: Diagnosis not present

## 2016-04-18 DIAGNOSIS — Z298 Encounter for other specified prophylactic measures: Secondary | ICD-10-CM | POA: Diagnosis not present

## 2016-04-18 DIAGNOSIS — R7989 Other specified abnormal findings of blood chemistry: Secondary | ICD-10-CM | POA: Diagnosis not present

## 2016-04-18 DIAGNOSIS — Z944 Liver transplant status: Secondary | ICD-10-CM | POA: Diagnosis not present

## 2016-04-20 DIAGNOSIS — E1165 Type 2 diabetes mellitus with hyperglycemia: Secondary | ICD-10-CM | POA: Diagnosis not present

## 2016-04-20 DIAGNOSIS — Z794 Long term (current) use of insulin: Secondary | ICD-10-CM | POA: Diagnosis not present

## 2016-04-21 DIAGNOSIS — E1165 Type 2 diabetes mellitus with hyperglycemia: Secondary | ICD-10-CM | POA: Diagnosis not present

## 2016-04-21 DIAGNOSIS — Z794 Long term (current) use of insulin: Secondary | ICD-10-CM | POA: Diagnosis not present

## 2016-05-09 DIAGNOSIS — R809 Proteinuria, unspecified: Secondary | ICD-10-CM | POA: Diagnosis not present

## 2016-05-09 DIAGNOSIS — Z794 Long term (current) use of insulin: Secondary | ICD-10-CM | POA: Diagnosis not present

## 2016-05-09 DIAGNOSIS — E1129 Type 2 diabetes mellitus with other diabetic kidney complication: Secondary | ICD-10-CM | POA: Diagnosis not present

## 2016-05-10 DIAGNOSIS — I509 Heart failure, unspecified: Secondary | ICD-10-CM | POA: Diagnosis not present

## 2016-05-16 DIAGNOSIS — I5032 Chronic diastolic (congestive) heart failure: Secondary | ICD-10-CM | POA: Diagnosis not present

## 2016-06-22 DIAGNOSIS — I5032 Chronic diastolic (congestive) heart failure: Secondary | ICD-10-CM | POA: Diagnosis not present

## 2016-06-22 DIAGNOSIS — I10 Essential (primary) hypertension: Secondary | ICD-10-CM | POA: Diagnosis not present

## 2016-07-15 DIAGNOSIS — Z794 Long term (current) use of insulin: Secondary | ICD-10-CM | POA: Diagnosis not present

## 2016-07-15 DIAGNOSIS — E1165 Type 2 diabetes mellitus with hyperglycemia: Secondary | ICD-10-CM | POA: Diagnosis not present

## 2016-08-08 DIAGNOSIS — M62830 Muscle spasm of back: Secondary | ICD-10-CM | POA: Diagnosis not present

## 2016-08-08 DIAGNOSIS — M109 Gout, unspecified: Secondary | ICD-10-CM | POA: Diagnosis not present

## 2016-08-12 DIAGNOSIS — Z6831 Body mass index (BMI) 31.0-31.9, adult: Secondary | ICD-10-CM | POA: Diagnosis not present

## 2016-08-12 DIAGNOSIS — M109 Gout, unspecified: Secondary | ICD-10-CM | POA: Diagnosis not present

## 2016-08-19 DIAGNOSIS — Z794 Long term (current) use of insulin: Secondary | ICD-10-CM | POA: Diagnosis not present

## 2016-08-19 DIAGNOSIS — R809 Proteinuria, unspecified: Secondary | ICD-10-CM | POA: Diagnosis not present

## 2016-08-19 DIAGNOSIS — E1129 Type 2 diabetes mellitus with other diabetic kidney complication: Secondary | ICD-10-CM | POA: Diagnosis not present

## 2016-08-23 DIAGNOSIS — Z6831 Body mass index (BMI) 31.0-31.9, adult: Secondary | ICD-10-CM | POA: Diagnosis not present

## 2016-08-23 DIAGNOSIS — M545 Low back pain: Secondary | ICD-10-CM | POA: Diagnosis not present

## 2016-09-16 ENCOUNTER — Ambulatory Visit (INDEPENDENT_AMBULATORY_CARE_PROVIDER_SITE_OTHER): Payer: Medicare Other | Admitting: Sports Medicine

## 2016-09-16 DIAGNOSIS — L600 Ingrowing nail: Secondary | ICD-10-CM

## 2016-09-16 DIAGNOSIS — M79675 Pain in left toe(s): Secondary | ICD-10-CM

## 2016-09-16 DIAGNOSIS — E1142 Type 2 diabetes mellitus with diabetic polyneuropathy: Secondary | ICD-10-CM

## 2016-09-16 MED ORDER — AMOXICILLIN-POT CLAVULANATE 875-125 MG PO TABS: 1.0000 | ORAL_TABLET | Freq: Two times a day (BID) | ORAL | 0 refills | Status: AC

## 2016-09-16 NOTE — Progress Notes (Signed)
Patient ID: Gregory Tanner, male   DOB: 30-Aug-1955, 61 y.o.   MRN: [id number] Subjective: Gregory Tanner is a 61 y.o.  diabetic  male patient presents to office today complaining of a painful incurvated, red, swollen medial nail border of the 2nd toe on the left foot. This has been present for last few days. Patient denies fever/chills/nausea/vomitting/any other related constitutional symptoms at this time.  A1C 5.8  Patient Active Problem List   Diagnosis Date Noted  . Chronic pain associated with significant psychosocial dysfunction 06/17/2015  . Nerve root pain 06/17/2015  . Ankylosis of lumbar spine 02/21/2014  . Degeneration of intervertebral disc of lumbar region 07/05/2013  . Lumbar radiculopathy 10/10/2012  . Encounter for other specified prophylactic measures 10/10/2011  . Excess weight 10/10/2011  . History of liver transplant ([place]) 07/20/2011  . Type 2 diabetes mellitus ([place]) 12/27/2010  . Hepatocellular carcinoma ([place]) 12/27/2010  . BP (high blood pressure) 12/27/2010    Current Outpatient Prescriptions on File Prior to Visit  Medication Sig Dispense Refill  . aspirin 81 MG tablet Take 81 mg by mouth daily.    . metoprolol succinate (TOPROL-XL) 25 MG 24 hr tablet   0  . Multiple Vitamin (MULTIVITAMIN) tablet Take 1 tablet by mouth daily.    Marland Kitchen amoxicillin-clavulanate (AUGMENTIN) 875-125 MG tablet Take 1 tablet by mouth 2 (two) times daily. (Patient not taking: Reported on 09/16/2016) 20 tablet 0  . calcium citrate-vitamin D (CITRACAL+D) 315-200 MG-UNIT per tablet Take 1 tablet by mouth 2 (two) times daily. MG IS 250-200MG  UNIT TABLET. LISA    . cephALEXin (KEFLEX) 500 MG capsule Take 1 capsule (500 mg total) by mouth 3 (three) times daily. (Patient not taking: Reported on 09/16/2016) 30 capsule 0  . furosemide (LASIX) 20 MG tablet Take by mouth.    . furosemide (LASIX) 40 MG tablet Take 60 mg by mouth.    Marland Kitchen HUMALOG KWIKPEN 100 UNIT/ML KiwkPen   4  . insulin aspart (NOVOLOG)  100 UNIT/ML injection Inject 100 Units into the skin 3 (three) times daily before meals.    . Insulin Glargine (LANTUS SOLOSTAR) 100 UNIT/ML Solostar Pen Inject into the skin daily at 10 pm.    . magnesium oxide (MAG-OX) 400 MG tablet Take 400 mg by mouth daily.    . metFORMIN (GLUCOPHAGE) 500 MG tablet   0  . metoprolol tartrate (LOPRESSOR) 25 MG tablet Take 25 mg by mouth 2 (two) times daily.    Marland Kitchen omeprazole (PRILOSEC) 20 MG capsule Take 20 mg by mouth daily.     No current facility-administered medications on file prior to visit.     Allergies  Allergen Reactions  . Iodinated Diagnostic Agents Hives and Other (See Comments)    Allergy is not to all contrast - but patient is unsure of which particular one his allergy is to. Allergy is not to all contrast - but patient is unsure of which particular one his allergy is to. Contrast dye used before liver transplant cause hives, not sure which dye this was but is able to use others  . Latex Other (See Comments)    Skin peeling  . Rosiglitazone Nausea And Vomiting    GI effects and abdominal pain    Objective:  There were no vitals filed for this visit.  General: Well developed, nourished, in no acute distress, alert and oriented x3   Dermatology: Skin is warm, dry and supple bilateral.Left 2nd toenail appears to be  severely incurvated with hyperkeratosis  formation at the distal aspects of  the medial nail border. (+) Erythema. (+) Edema. (-) serosanguous  drainage present. The remaining nails appear unremarkable at this time free from ingrowing. However, all nails are mildly discolored with mild subungual debris, consistent with mycosis; trimmed left hallux nail for patient. There are no open sores, lesions or other signs of infection present.  Vascular: Dorsalis Pedis artery and Posterior Tibial artery pedal pulses are 2/4 bilateral with immedate capillary fill time. Pedal hair growth present. No lower extremity edema.   Neruologic:  Grossly intact via light touch bilateral. Protective and epicritic sensation intact with Semmes Weinstein monofilament subjective tingling to toes, unchanged from prior  Musculoskeletal: Tenderness to palpation of the left 2nd toe medial nail fold. Muscular strength within normal limits in all groups bilateral.   Assesement and Plan: Problem List Items Addressed This Visit    None    Visit Diagnoses    Ingrown nail    -  Primary   left 2nd toenail medial margin    Relevant Medications   amoxicillin-clavulanate (AUGMENTIN) 875-125 MG tablet   Toe pain, left       Diabetic peripheral neuropathy associated with type 2 diabetes mellitus (HCC)       Relevant Medications   HUMULIN R U-500 KWIKPEN 500 UNIT/ML kwikpen   losartan (COZAAR) 25 MG tablet   metaxalone (SKELAXIN) 800 MG tablet   Ingrown nail       recurrent at Left medial hallux    Relevant Medications   amoxicillin-clavulanate (AUGMENTIN) 875-125 MG tablet     -Discussed treatment alternatives and plan of care; Explained permanent/temporary nail avulsion and post procedure course to patient.  - After a verbal consent, injected 3 ml of a 50:50 mixture of 2% plain  lidocaine and 0.5% plain marcaine in a normal digital block fashion. Next, a  betadine prep was performed. Anesthesia was tested and found to be appropriate.  The offending left 2nd toe medial nail border was then incised from the hyponychium to the epinychium. The offending nail border was removed and cleared from the field. The area was curretted for any remaining nail or spicules. Phenol application performed and the area was then flushed with alcohol and dressed with antibiotic cream and a dry sterile dressing. -Patient was instructed to leave the dressing intact for today and begin soaking  in a weak solution of betadine or epsom salt and water tomorrow. Patient was instructed to  soak for 15 minutes each day and apply antibiotic cream and a gauze or bandaid dressing  each day. -Rx Augmentin -Patient was instructed to monitor the toe for signs of infection and return to office if toe becomes red, hot or swollen. -Advised ice, elevation, and tylenol or motrin if needed for pain.  -Trimmed left hallux toenail for patient without incident  -Advised close inspection and daily monitoring in the setting of diabetes -Patient is to return in 2 weeks for follow up care/nail check or sooner if problems arise.  Landis Martins, DPM

## 2016-09-16 NOTE — Patient Instructions (Signed)

## 2016-09-26 ENCOUNTER — Ambulatory Visit (INDEPENDENT_AMBULATORY_CARE_PROVIDER_SITE_OTHER): Payer: Medicare Other | Admitting: Cardiology

## 2016-09-26 ENCOUNTER — Encounter: Payer: Self-pay | Admitting: Cardiology

## 2016-09-26 VITALS — BP 114/60 | HR 81 | Ht 69.0 in | Wt 223.1 lb

## 2016-09-26 DIAGNOSIS — I5032 Chronic diastolic (congestive) heart failure: Secondary | ICD-10-CM | POA: Diagnosis not present

## 2016-09-26 DIAGNOSIS — I110 Hypertensive heart disease with heart failure: Secondary | ICD-10-CM

## 2016-09-26 HISTORY — DX: Chronic diastolic (congestive) heart failure: I50.32

## 2016-09-26 NOTE — Patient Instructions (Addendum)
Medication Instructions:  Your physician recommends that you continue on your current medications as directed. Please refer to the Current Medication list given to you today.   Labwork: None  Testing/Procedures: None  Follow-Up: Your physician recommends that you schedule a follow-up appointment in: 3 months.   Any Other Special Instructions Will Be Listed Below (If Applicable).     If you need a refill on your cardiac medications before your next appointment, please call your pharmacy.    Gout Gout is painful swelling that can occur in some of your joints. Gout is a type of arthritis. This condition is caused by having too much uric acid in your body. Uric acid is a chemical that forms when your body breaks down substances called purines. Purines are important for building body proteins. When your body has too much uric acid, sharp crystals can form and build up inside your joints. This causes pain and swelling. Gout attacks can happen quickly and be very painful (acute gout). Over time, the attacks can affect more joints and become more frequent (chronic gout). Gout can also cause uric acid to build up under your skin and inside your kidneys. What are the causes? This condition is caused by too much uric acid in your blood. This can occur because:  Your kidneys do not remove enough uric acid from your blood. This is the most common cause.  Your body makes too much uric acid. This can occur with some cancers and cancer treatments. It can also occur if your body is breaking down too many red blood cells (hemolytic anemia).  You eat too many foods that are high in purines. These foods include organ meats and some seafood. Alcohol, especially beer, is also high in purines.  A gout attack may be triggered by trauma or stress. What increases the risk? This condition is more likely to develop in people who:  Have a family history of gout.  Are male and middle-aged.  Are male  and have gone through menopause.  Are obese.  Frequently drink alcohol, especially beer.  Are dehydrated.  Lose weight too quickly.  Have an organ transplant.  Have lead poisoning.  Take certain medicines, including aspirin, cyclosporine, diuretics, levodopa, and niacin.  Have kidney disease or psoriasis.  What are the signs or symptoms? An attack of acute gout happens quickly. It usually occurs in just one joint. The most common place is the big toe. Attacks often start at night. Other joints that may be affected include joints of the feet, ankle, knee, fingers, wrist, or elbow. Symptoms may include:  Severe pain.  Warmth.  Swelling.  Stiffness.  Tenderness. The affected joint may be very painful to touch.  Shiny, red, or purple skin.  Chills and fever.  Chronic gout may cause symptoms more frequently. More joints may be involved. You may also have white or yellow lumps (tophi) on your hands or feet or in other areas near your joints. How is this diagnosed? This condition is diagnosed based on your symptoms, medical history, and physical exam. You may have tests, such as:  Blood tests to measure uric acid levels.  Removal of joint fluid with a needle (aspiration) to look for uric acid crystals.  X-rays to look for joint damage.  How is this treated? Treatment for this condition has two phases: treating an acute attack and preventing future attacks. Acute gout treatment may include medicines to reduce pain and swelling, including:  NSAIDs.  Steroids. These are strong anti-inflammatory  medicines that can be taken by mouth (orally) or injected into a joint.  Colchicine. This medicine relieves pain and swelling when it is taken soon after an attack. It can be given orally or through an IV tube.  Preventive treatment may include:  Daily use of smaller doses of NSAIDs or colchicine.  Use of a medicine that reduces uric acid levels in your blood.  Changes to your  diet. You may need to see a specialist about healthy eating (dietitian).  Follow these instructions at home: During a Gout Attack  If directed, apply ice to the affected area: ? Put ice in a plastic bag. ? Place a towel between your skin and the bag. ? Leave the ice on for 20 minutes, 2-3 times a day.  Rest the joint as much as possible. If the affected joint is in your leg, you may be given crutches to use.  Raise (elevate) the affected joint above the level of your heart as often as possible.  Drink enough fluids to keep your urine clear or pale yellow.  Take over-the-counter and prescription medicines only as told by your health care provider.  Do not drive or operate heavy machinery while taking prescription pain medicine.  Follow instructions from your health care provider about eating or drinking restrictions.  Return to your normal activities as told by your health care provider. Ask your health care provider what activities are safe for you. Avoiding Future Gout Attacks  Follow a low-purine diet as told by your dietitian or health care provider. Avoid foods and drinks that are high in purines, including liver, kidney, anchovies, asparagus, herring, mushrooms, mussels, and beer.  Limit alcohol intake to no more than 1 drink a day for nonpregnant women and 2 drinks a day for men. One drink equals 12 oz of beer, 5 oz of wine, or 1 oz of hard liquor.  Maintain a healthy weight or lose weight if you are overweight. If you want to lose weight, talk with your health care provider. It is important that you do not lose weight too quickly.  Start or maintain an exercise program as told by your health care provider.  Drink enough fluids to keep your urine clear or pale yellow.  Take over-the-counter and prescription medicines only as told by your health care provider.  Keep all follow-up visits as told by your health care provider. This is important. Contact a health care provider  if:  You have another gout attack.  You continue to have symptoms of a gout attack after10 days of treatment.  You have side effects from your medicines.  You have chills or a fever.  You have burning pain when you urinate.  You have pain in your lower back or belly. Get help right away if:  You have severe or uncontrolled pain.  You cannot urinate. This information is not intended to replace advice given to you by your health care provider. Make sure you discuss any questions you have with your health care provider. Document Released: 01/29/2000 Document Revised: 07/09/2015 Document Reviewed: 11/13/2014 Elsevier Interactive Patient Education  2017 [employer].

## 2016-09-26 NOTE — Progress Notes (Signed)
Cardiology Office Note:    Date:  09/26/2016   ID:  Gregory Tanner, DOB [birth year], MRN [id number]  PCP:  Ronita Hipps, MD  Cardiologist:  Shirlee More, MD    Referring MD: Ronita Hipps, MD    ASSESSMENT:    1. Hypertensive heart failure ([place])   2. Chronic diastolic heart failure (HCC)    PLAN:    In order of problems listed above:  1. Stable compensated no volume overload continue current loop diuretic and beta blocker and ARB 2. Stable compensated   Next appointment: 3 months   Medication Adjustments/Labs and Tests Ordered: Current medicines are reviewed at length with the patient today.  Concerns regarding medicines are outlined above.  No orders of the defined types were placed in this encounter.  No orders of the defined types were placed in this encounter.   Chief Complaint  Patient presents with  . Follow-up    Routine flup appt   . Congestive Heart Failure    History of Present Illness:    PARKS CZAJKOWSKI is a 61 y.o. male with a hx of liver transplantation, hypertension and heart failure last seen 3 months ago.Marland Kitchen His weight is been stable without edema or shortness of breath but he has been troubled with gallops under control with allopurinol and colchicine when necessary. Compliance with diet, lifestyle and medications: Yes Past Medical History:  Diagnosis Date  . Ankylosis of lumbar spine 02/21/2014  . Benign essential hypertension 10/26/2015  . BP (high blood pressure) 12/27/2010  . Chronic pain associated with significant psychosocial dysfunction 06/17/2015  . Chronic pain disorder 10/28/2015  . Degeneration of intervertebral disc of lumbar region 07/05/2013  . Degenerative disc disease, lumbar 07/26/2012  . Diabetes mellitus without complication ([place])   . Encounter for other specified prophylactic measures 10/10/2011  . Excess weight 10/10/2011  . Hearing loss   . Hepatocellular carcinoma ([place]) 12/27/2010   Overview:  Embolized 6/12   . History  of liver transplant ([place]) 07/20/2011   Overview:  06/07/2011 for cryptogenic cirrhosis and HCC   . Hypertension   . Liver disease   . Lumbar radiculopathy 10/10/2012  . Nerve root pain 06/17/2015   Overview:  Right   . SOB (shortness of breath) on exertion 10/28/2015  . Swelling   . Type 2 diabetes mellitus ([place]) 12/27/2010   Overview:  H1C 6.5 12/2010     Past Surgical History:  Procedure Laterality Date  . BACK SURGERY    . BAKER CYST SURGERY     RIGHT LEG  . HERNIA REPAIR    . LIVER SURGERY     IMPLANT  . LIVER TRANSPLANT    . TONSILL SURGERY      Current Medications: Current Meds  Medication Sig  . allopurinol (ZYLOPRIM) 300 MG tablet TAKE 1 TABLET BY MOUTH ONCE (1) DAILY  . amoxicillin-clavulanate (AUGMENTIN) 875-125 MG tablet Take 1 tablet by mouth 2 (two) times daily.  Marland Kitchen aspirin 81 MG tablet Take 81 mg by mouth daily.  . bumetanide (BUMEX) 2 MG tablet TAKE 1 AND 1/2 TABLETS BY MOUTH TWICE (2) DAILY UNLESS YOU WEIGH 219 OR LESS THEN TAKE 1 TABLET DAILY  . COLCRYS 0.6 MG tablet TAKE 1 TABLET BY MOUTH ONCE (1) DAILY FOR GOUT. TAKE no more THAN 5 DAYS straight IN an acute flair.  Marland Kitchen HUMULIN R U-500 KWIKPEN 500 UNIT/ML kwikpen Inject 120 units with breakfast, 140 units with dinner, additional 20-40 units with snacks. Needs 8 pens per  month.  . losartan (COZAAR) 25 MG tablet TAKE 1 TABLET BY MOUTH ONCE (1) DAILY  . metaxalone (SKELAXIN) 800 MG tablet TAKE 1 TABLET BY MOUTH THREE (3) TIMES DAILY AS NEEDED FOR MUSCLE SPASMS  . metolazone (ZAROXOLYN) 5 MG tablet Take 5 mg by mouth once a week.  . metoprolol tartrate (LOPRESSOR) 25 MG tablet Take 50 mg by mouth 2 (two) times daily.   . Multiple Vitamin (MULTIVITAMIN) tablet Take 1 tablet by mouth daily.  . mycophenolate (CELLCEPT) 250 MG capsule TAKE 3 CAPSULES BY MOUTH TWICE (2) DAILY  . oxyCODONE (OXY IR/ROXICODONE) 5 MG immediate release tablet Take 5 mg by mouth every 6 (six) hours as needed. for pain  . tacrolimus (PROGRAF) 0.5 MG  capsule Take 0.5 capsules by mouth as directed. Takes 2 capsules in the am, and 1 at night     Allergies:   Iodinated diagnostic agents; Latex; and Rosiglitazone   Social History   Social History  . Marital status: Married    Spouse name: N/A  . Number of children: N/A  . Years of education: N/A   Social History Main Topics  . Smoking status: Never Smoker  . Smokeless tobacco: Never Used  . Alcohol use No  . Drug use: No  . Sexual activity: Not Asked   Other Topics Concern  . None   Social History Narrative  . None     Family History: The patient's family history includes Cancer in his mother; Diabetes in his father; Heart attack in his father; Hypertension in his brother. ROS:   Please see the history of present illness.    All other systems reviewed and are negative.  EKGs/Labs/Other Studies Reviewed:    The following studies were reviewed today:  Recent Labs: 07/28/16 At [facility], CBC normal GFR 59 mL potassium 3.7   Physical Exam:    VS:  BP 114/60 (BP Location: Right Arm, Patient Position: Sitting)   Pulse 81   Ht 5\' 9"  (1.753 m)   Wt 223 lb 1.9 oz (101.2 kg)   SpO2 95%   BMI 32.95 kg/m     Wt Readings from Last 3 Encounters:  09/26/16 223 lb 1.9 oz (101.2 kg)  11/17/14 232 lb 14.4 oz (105.6 kg)     GEN:  Well nourished, well developed in no acute distress HEENT: Normal NECK: No JVD; No carotid bruits LYMPHATICS: No lymphadenopathy CARDIAC: RRR, no murmurs, rubs, gallops RESPIRATORY:  Clear to auscultation without rales, wheezing or rhonchi  ABDOMEN: Soft, non-tender, non-distended MUSCULOSKELETAL:  No edema; No deformity  SKIN: Warm and dry NEUROLOGIC:  Alert and oriented x 3 PSYCHIATRIC:  Normal affect    Signed, Shirlee More, MD  09/26/2016 11:51 AM    [place]

## 2016-09-30 ENCOUNTER — Encounter: Payer: Self-pay | Admitting: Sports Medicine

## 2016-09-30 ENCOUNTER — Ambulatory Visit (INDEPENDENT_AMBULATORY_CARE_PROVIDER_SITE_OTHER): Payer: Medicare Other | Admitting: Sports Medicine

## 2016-09-30 DIAGNOSIS — Z9889 Other specified postprocedural states: Secondary | ICD-10-CM

## 2016-09-30 DIAGNOSIS — M79675 Pain in left toe(s): Secondary | ICD-10-CM

## 2016-09-30 DIAGNOSIS — E1142 Type 2 diabetes mellitus with diabetic polyneuropathy: Secondary | ICD-10-CM

## 2016-09-30 NOTE — Progress Notes (Signed)
Subjective: Gregory Tanner is a 61 y.o. diabetic male patient returns to office today for follow up evaluation after having left 2nd toe medial permanent nail avulsion performed on 09-16-16. Patient has been soaking using epsom salt and applying topical antibiotic covered with bandaid daily. Patient denies fever/chills/nausea/vomitting/any other related constitutional symptoms at this time.  Patient Active Problem List   Diagnosis Date Noted  . Chronic diastolic heart failure ([place]) 09/26/2016  . Chronic pain disorder 10/28/2015  . SOB (shortness of breath) on exertion 10/28/2015  . Hypertensive heart failure ([place]) 10/26/2015  . Chronic pain associated with significant psychosocial dysfunction 06/17/2015  . Nerve root pain 06/17/2015  . Ankylosis of lumbar spine 02/21/2014  . Degeneration of intervertebral disc of lumbar region 07/05/2013  . Lumbar radiculopathy 10/10/2012  . Degenerative disc disease, lumbar 07/26/2012  . Encounter for other specified prophylactic measures 10/10/2011  . Excess weight 10/10/2011  . History of liver transplant ([place]) 07/20/2011  . Type 2 diabetes mellitus ([place]) 12/27/2010  . Hepatocellular carcinoma ([place]) 12/27/2010  . BP (high blood pressure) 12/27/2010    Current Outpatient Prescriptions on File Prior to Visit  Medication Sig Dispense Refill  . allopurinol (ZYLOPRIM) 300 MG tablet TAKE 1 TABLET BY MOUTH ONCE (1) DAILY  3  . amoxicillin-clavulanate (AUGMENTIN) 875-125 MG tablet Take 1 tablet by mouth 2 (two) times daily. 28 tablet 0  . aspirin 81 MG tablet Take 81 mg by mouth daily.    . bumetanide (BUMEX) 2 MG tablet TAKE 1 AND 1/2 TABLETS BY MOUTH TWICE (2) DAILY UNLESS YOU WEIGH 219 OR LESS THEN TAKE 1 TABLET DAILY  3  . COLCRYS 0.6 MG tablet TAKE 1 TABLET BY MOUTH ONCE (1) DAILY FOR GOUT. TAKE no more THAN 5 DAYS straight IN an acute flair.  3  . HUMULIN R U-500 KWIKPEN 500 UNIT/ML kwikpen Inject 120 units with breakfast, 140 units with dinner,  additional 20-40 units with snacks. Needs 8 pens per month.  5  . losartan (COZAAR) 25 MG tablet TAKE 1 TABLET BY MOUTH ONCE (1) DAILY  3  . metaxalone (SKELAXIN) 800 MG tablet TAKE 1 TABLET BY MOUTH THREE (3) TIMES DAILY AS NEEDED FOR MUSCLE SPASMS  2  . metolazone (ZAROXOLYN) 5 MG tablet Take 5 mg by mouth once a week.    . metoprolol tartrate (LOPRESSOR) 25 MG tablet Take 50 mg by mouth 2 (two) times daily.     . Multiple Vitamin (MULTIVITAMIN) tablet Take 1 tablet by mouth daily.    . mycophenolate (CELLCEPT) 250 MG capsule TAKE 3 CAPSULES BY MOUTH TWICE (2) DAILY  3  . oxyCODONE (OXY IR/ROXICODONE) 5 MG immediate release tablet Take 5 mg by mouth every 6 (six) hours as needed. for pain  0  . tacrolimus (PROGRAF) 0.5 MG capsule Take 0.5 capsules by mouth as directed. Takes 2 capsules in the am, and 1 at night     No current facility-administered medications on file prior to visit.     Allergies  Allergen Reactions  . Iodinated Diagnostic Agents Hives and Other (See Comments)    Allergy is not to all contrast - but patient is unsure of which particular one his allergy is to. Allergy is not to all contrast - but patient is unsure of which particular one his allergy is to. Contrast dye used before liver transplant cause hives, not sure which dye this was but is able to use others  . Latex Other (See Comments)    Skin peeling  .  Rosiglitazone Nausea And Vomiting    GI effects and abdominal pain    Objective:  General: Well developed, nourished, in no acute distress, alert and oriented x3   Dermatology: Skin is warm, dry and supple bilateral. Left 2nd medial nail bed appears to be clean, dry, with mild granular tissue and surrounding eschar/scab. (-) Decreased Erythema. (-) Edema. (-) serosanguous drainage present. The remaining nails appear unremarkable at this time. There are no other lesions or other signs of infection present.  Neurovascular status: Intact. No lower extremity  swelling; No pain with calf compression bilateral.  Musculoskeletal: Decreased tenderness to palpation of the left 2nd medial nail fold. Muscular strength within normal limits bilateral.   Assesement and Plan: Problem List Items Addressed This Visit    None    Visit Diagnoses    S/P nail surgery    -  Primary   Toe pain, left       Diabetic peripheral neuropathy associated with type 2 diabetes mellitus ([place])          -Examined patient  -Cleansed left 2nd toe medial nail fold and gently scrubbed with peroxide and q-tip/curetted away eschar at site and applied antibiotic cream covered with bandaid.  -Discussed plan of care with patient. -Patient to continue soaking in a solution of Epsom salt and warm water. Patient was instructed to soak for 15-20 minutes each day until the toe appears normal and there is no drainage, redness, tenderness, or swelling at the procedure site, and apply neosporin and a gauze or bandaid dressing each day as needed. May leave open to air at night. -Educated patient on long term care after nail surgery. -Patient was instructed to monitor the toe for reoccurrence and signs of infection; Patient advised to return to office or go to ER if toe becomes red, hot or swollen. -Patient is to return as needed or sooner if problems arise.  Landis Martins, DPM

## 2016-10-11 DIAGNOSIS — L821 Other seborrheic keratosis: Secondary | ICD-10-CM | POA: Diagnosis not present

## 2016-10-11 DIAGNOSIS — L720 Epidermal cyst: Secondary | ICD-10-CM | POA: Diagnosis not present

## 2016-10-11 DIAGNOSIS — L570 Actinic keratosis: Secondary | ICD-10-CM | POA: Diagnosis not present

## 2016-10-11 DIAGNOSIS — C4441 Basal cell carcinoma of skin of scalp and neck: Secondary | ICD-10-CM | POA: Diagnosis not present

## 2016-10-13 DIAGNOSIS — E1165 Type 2 diabetes mellitus with hyperglycemia: Secondary | ICD-10-CM | POA: Diagnosis not present

## 2016-10-13 DIAGNOSIS — Z794 Long term (current) use of insulin: Secondary | ICD-10-CM | POA: Diagnosis not present

## 2016-10-14 DIAGNOSIS — Z794 Long term (current) use of insulin: Secondary | ICD-10-CM | POA: Diagnosis not present

## 2016-10-14 DIAGNOSIS — E1165 Type 2 diabetes mellitus with hyperglycemia: Secondary | ICD-10-CM | POA: Diagnosis not present

## 2016-11-01 DIAGNOSIS — D045 Carcinoma in situ of skin of trunk: Secondary | ICD-10-CM | POA: Diagnosis not present

## 2016-11-01 DIAGNOSIS — C4441 Basal cell carcinoma of skin of scalp and neck: Secondary | ICD-10-CM | POA: Diagnosis not present

## 2016-11-08 DIAGNOSIS — C44619 Basal cell carcinoma of skin of left upper limb, including shoulder: Secondary | ICD-10-CM | POA: Diagnosis not present

## 2016-11-15 DIAGNOSIS — E1165 Type 2 diabetes mellitus with hyperglycemia: Secondary | ICD-10-CM | POA: Diagnosis not present

## 2016-11-15 DIAGNOSIS — E119 Type 2 diabetes mellitus without complications: Secondary | ICD-10-CM | POA: Diagnosis not present

## 2016-11-15 DIAGNOSIS — Z23 Encounter for immunization: Secondary | ICD-10-CM | POA: Diagnosis not present

## 2016-11-21 DIAGNOSIS — H4302 Vitreous prolapse, left eye: Secondary | ICD-10-CM | POA: Diagnosis not present

## 2016-12-12 DIAGNOSIS — H4302 Vitreous prolapse, left eye: Secondary | ICD-10-CM | POA: Diagnosis not present

## 2016-12-19 ENCOUNTER — Other Ambulatory Visit: Payer: Self-pay

## 2016-12-19 MED ORDER — METOLAZONE 5 MG PO TABS: 5.0000 mg | ORAL_TABLET | ORAL | 3 refills | Status: DC

## 2016-12-27 ENCOUNTER — Ambulatory Visit: Payer: Medicare Other | Admitting: Cardiology

## 2016-12-27 VITALS — BP 122/52 | HR 76 | Ht 70.0 in | Wt 231.1 lb

## 2016-12-27 DIAGNOSIS — I5032 Chronic diastolic (congestive) heart failure: Secondary | ICD-10-CM

## 2016-12-27 DIAGNOSIS — R05 Cough: Secondary | ICD-10-CM

## 2016-12-27 DIAGNOSIS — I110 Hypertensive heart disease with heart failure: Secondary | ICD-10-CM

## 2016-12-27 DIAGNOSIS — R059 Cough, unspecified: Secondary | ICD-10-CM

## 2016-12-27 MED ORDER — METOPROLOL TARTRATE 25 MG PO TABS: 25.0000 mg | ORAL_TABLET | Freq: Two times a day (BID) | ORAL | 0 refills | Status: DC

## 2016-12-27 MED ORDER — LOSARTAN POTASSIUM 25 MG PO TABS: ORAL_TABLET | ORAL | 3 refills | Status: DC

## 2016-12-27 NOTE — Progress Notes (Signed)
Cardiology Office Note:    Date:  12/27/2016   ID:  Gregory Tanner, DOB 18-Oct-1955, MRN [id number]  PCP:  Ronita Hipps, MD  Cardiologist:  Shirlee More, MD    Referring MD: Ronita Hipps, MD    ASSESSMENT:    1. Chronic diastolic heart failure ([place])   2. Hypertensive heart disease with heart failure ([place])   3. Cough    PLAN:    In order of problems listed above:  1. His heart failure is compensated continue his current diuretic 2. Hypertension is stable, he has 2 major concerns the first is a cough that may be related to ARB but the others changes in mood and personality that he thinks is related to beta-blocker.  He will follow heart rate and blood pressure at home and gradually withdraws beta-blocker over 2 weeks to see if he is improved.  I told him in my opinion I think he is having depression related to chronic illness and if unimproved will need to seek attention.  He will continue his ARB for the time being 3. See discussion above, may be due to his ARB however I would not stop all of his antihypertensive at one time for perceived side effects.   Next appointment: 3 months   Medication Adjustments/Labs and Tests Ordered: Current medicines are reviewed at length with the patient today.  Concerns regarding medicines are outlined above.  No orders of the defined types were placed in this encounter.  No orders of the defined types were placed in this encounter.   Chief Complaint  Patient presents with  . Follow-up    History of Present Illness:    Gregory Tanner is a 61 y.o. male with a hx of  liver transplantation, hypertension and heart failure  last seen 3 months ago. Compliance with diet, lifestyle and medications: Yes He does not have shortness of breath chest pain palpitation or syncope but is concerned about a cough that he thinks is related ARB and changes in his personality and mood that he is concerned of side effects of beta-blocker. Past Medical  History:  Diagnosis Date  . Ankylosis of lumbar spine 02/21/2014  . Benign essential hypertension 10/26/2015  . BP (high blood pressure) 12/27/2010  . Chronic pain associated with significant psychosocial dysfunction 06/17/2015  . Chronic pain disorder 10/28/2015  . Degeneration of intervertebral disc of lumbar region 07/05/2013  . Degenerative disc disease, lumbar 07/26/2012  . Diabetes mellitus without complication ([place])   . Encounter for other specified prophylactic measures 10/10/2011  . Excess weight 10/10/2011  . Hearing loss   . Hepatocellular carcinoma ([place]) 12/27/2010   Overview:  Embolized 6/12   . History of liver transplant ([place]) 07/20/2011   Overview:  06/07/2011 for cryptogenic cirrhosis and HCC   . Hypertension   . Liver disease   . Lumbar radiculopathy 10/10/2012  . Nerve root pain 06/17/2015   Overview:  Right   . SOB (shortness of breath) on exertion 10/28/2015  . Swelling   . Type 2 diabetes mellitus ([place]) 12/27/2010   Overview:  H1C 6.5 12/2010     Past Surgical History:  Procedure Laterality Date  . BACK SURGERY    . BAKER CYST SURGERY     RIGHT LEG  . HERNIA REPAIR    . LIVER SURGERY     IMPLANT  . LIVER TRANSPLANT    . TONSILL SURGERY      Current Medications: Current Meds  Medication Sig  .  allopurinol (ZYLOPRIM) 300 MG tablet TAKE 1 TABLET BY MOUTH ONCE (1) DAILY  . aspirin 81 MG tablet Take 81 mg by mouth daily.  . bumetanide (BUMEX) 2 MG tablet TAKE 1 AND 1/2 TABLETS BY MOUTH TWICE (2) DAILY UNLESS YOU WEIGH 219 OR LESS THEN TAKE 1 TABLET DAILY  . COLCRYS 0.6 MG tablet TAKE 1 TABLET BY MOUTH ONCE (1) DAILY FOR GOUT. TAKE no more THAN 5 DAYS straight IN an acute flair.  Marland Kitchen HUMULIN R U-500 KWIKPEN 500 UNIT/ML kwikpen Inject 115 units with breakfast, 115 units with dinner, additional 40 units with snacks. Needs 8 pens per month.  . metaxalone (SKELAXIN) 800 MG tablet TAKE 1 TABLET BY MOUTH THREE (3) TIMES DAILY AS NEEDED FOR MUSCLE SPASMS  . metolazone  (ZAROXOLYN) 5 MG tablet Take 1 tablet (5 mg total) once a week by mouth.  . metoprolol tartrate (LOPRESSOR) 25 MG tablet Take 50 mg by mouth 2 (two) times daily.   . Multiple Vitamin (MULTIVITAMIN) tablet Take 1 tablet by mouth daily.  . mycophenolate (CELLCEPT) 250 MG capsule TAKE 3 CAPSULES BY MOUTH TWICE (2) DAILY  . oxyCODONE (OXY IR/ROXICODONE) 5 MG immediate release tablet Take 5 mg by mouth every 6 (six) hours as needed. for pain  . tacrolimus (PROGRAF) 0.5 MG capsule Take 0.5 capsules by mouth as directed. Takes 2 capsules in the am, and 1 at night  . [DISCONTINUED] losartan (COZAAR) 25 MG tablet TAKE 1 TABLET BY MOUTH ONCE (1) DAILY     Allergies:   Iodinated diagnostic agents; Latex; and Rosiglitazone   Social History   Socioeconomic History  . Marital status: Married    Spouse name: Not on file  . Number of children: Not on file  . Years of education: Not on file  . Highest education level: Not on file  Social Needs  . Financial resource strain: Not on file  . Food insecurity - worry: Not on file  . Food insecurity - inability: Not on file  . Transportation needs - medical: Not on file  . Transportation needs - non-medical: Not on file  Occupational History  . Not on file  Tobacco Use  . Smoking status: Never Smoker  . Smokeless tobacco: Never Used  Substance and Sexual Activity  . Alcohol use: No    Alcohol/week: 0.0 oz  . Drug use: No  . Sexual activity: Not on file  Other Topics Concern  . Not on file  Social History Narrative  . Not on file     Family History: The patient's family history includes Cancer in his mother; Diabetes in his father; Heart attack in his father; Hypertension in his brother. ROS:   Please see the history of present illness.    All other systems reviewed and are negative.  EKGs/Labs/Other Studies Reviewed:    The following studies were reviewed today:   Recent Labs: No results found for requested labs within last 8760 hours.    Recent Lipid Panel No results found for: CHOL, TRIG, HDL, CHOLHDL, VLDL, LDLCALC, LDLDIRECT  Physical Exam:    VS:  BP (!) 122/52   Pulse 76   Ht 5\' 10"  (1.778 m)   Wt 231 lb 1.9 oz (104.8 kg)   SpO2 96%   BMI 33.16 kg/m     Wt Readings from Last 3 Encounters:  12/27/16 231 lb 1.9 oz (104.8 kg)  09/26/16 223 lb 1.9 oz (101.2 kg)  11/17/14 232 lb 14.4 oz (105.6 kg)  GEN:  Well nourished, well developed in no acute distress HEENT: Normal NECK: No JVD; No carotid bruits LYMPHATICS: No lymphadenopathy CARDIAC: RRR, no murmurs, rubs, gallops RESPIRATORY:  Clear to auscultation without rales, wheezing or rhonchi  ABDOMEN: Soft, non-tender, non-distended MUSCULOSKELETAL: 1+ bilateral to the knee edema; No deformity  SKIN: Warm and dry NEUROLOGIC:  Alert and oriented x 3 PSYCHIATRIC:  Normal affect    Signed, Shirlee More, MD  12/27/2016 10:28 AM    [facility]

## 2016-12-27 NOTE — Patient Instructions (Addendum)
Medication Instructions:  Your physician has recommended you make the following change in your medication:  STOP metoprolol. Take 12.5 mg for one week. Then stop completely.  Labwork: None  Testing/Procedures: None  Follow-Up: Your physician wants you to follow-up in: 3 months. You will receive a reminder letter in the mail two months in advance. If you don't receive a letter, please call our office to schedule the follow-up appointment.  Any Other Special Instructions Will Be Listed Below (If Applicable).     If you need a refill on your cardiac medications before your next appointment, please call your pharmacy.

## 2017-01-15 DIAGNOSIS — Z794 Long term (current) use of insulin: Secondary | ICD-10-CM | POA: Diagnosis not present

## 2017-01-15 DIAGNOSIS — E1165 Type 2 diabetes mellitus with hyperglycemia: Secondary | ICD-10-CM | POA: Diagnosis not present

## 2017-01-16 DIAGNOSIS — H5213 Myopia, bilateral: Secondary | ICD-10-CM | POA: Diagnosis not present

## 2017-01-20 DIAGNOSIS — I10 Essential (primary) hypertension: Secondary | ICD-10-CM | POA: Diagnosis not present

## 2017-01-20 DIAGNOSIS — Z944 Liver transplant status: Secondary | ICD-10-CM | POA: Diagnosis not present

## 2017-01-20 DIAGNOSIS — B9689 Other specified bacterial agents as the cause of diseases classified elsewhere: Secondary | ICD-10-CM | POA: Diagnosis not present

## 2017-01-20 DIAGNOSIS — J019 Acute sinusitis, unspecified: Secondary | ICD-10-CM | POA: Diagnosis not present

## 2017-01-27 ENCOUNTER — Other Ambulatory Visit: Payer: Self-pay

## 2017-01-27 MED ORDER — BUMETANIDE 2 MG PO TABS: ORAL_TABLET | ORAL | 3 refills | Status: DC

## 2017-02-28 DIAGNOSIS — L570 Actinic keratosis: Secondary | ICD-10-CM | POA: Diagnosis not present

## 2017-02-28 DIAGNOSIS — D485 Neoplasm of uncertain behavior of skin: Secondary | ICD-10-CM | POA: Diagnosis not present

## 2017-02-28 DIAGNOSIS — C44319 Basal cell carcinoma of skin of other parts of face: Secondary | ICD-10-CM | POA: Diagnosis not present

## 2017-03-14 DIAGNOSIS — D0439 Carcinoma in situ of skin of other parts of face: Secondary | ICD-10-CM | POA: Diagnosis not present

## 2017-03-21 DIAGNOSIS — E119 Type 2 diabetes mellitus without complications: Secondary | ICD-10-CM | POA: Diagnosis not present

## 2017-03-26 NOTE — Progress Notes (Signed)
Cardiology Office Note:    Date:  03/27/2017   ID:  Gregory Tanner, DOB [birth year], MRN [id number]  PCP:  Ronita Hipps, MD  Cardiologist:  Shirlee More, MD    Referring MD: Ronita Hipps, MD    ASSESSMENT:    1. Chronic diastolic heart failure ([place])   2. Hypertensive heart disease with heart failure ([place])   3. CKD (chronic kidney disease) stage 3, GFR 30-59 ml/min ([facility])    PLAN:    In order of problems listed above:  1. stable compensated continue his current diuretics and sodium restriction he is due for labs with the transplant team in the next few weeks.   2. Blood pressure stable continue current treatment including ARB 3. Stable await follow-up labs with  transplant service   Next appointment: July 2019   Medication Adjustments/Labs and Tests Ordered: Current medicines are reviewed at length with the patient today.  Concerns regarding medicines are outlined above.  No orders of the defined types were placed in this encounter.  No orders of the defined types were placed in this encounter.   Chief Complaint  Patient presents with  . Follow-up    3 month flup appt   . Congestive Heart Failure    History of Present Illness:    Gregory Tanner is a 62 y.o. male with a hx of [place] and  liver transplantation, hypertension stable CKD  and heart failure last seen 3 months ago Compliance with diet, lifestyle and medications: Yes  Is stable exertional dyspnea climbing stairs mild degree of edema no orthopnea PND chest pain palpitation or syncope.  He was seen by endocrinology 03/21/17: Assessment:  Patient is a pleasant 62 y.o. male here for follow up of type 2 diabetes, relatively well controlled and complicated by liver transplant. Positive UMA Plan:  Diabetes:  - excellent control of BGs  - continue u500: 115 with breakfast and dinner, 50 u with small lunch or 115 with large lunch, 40 u with snack - DM maintenance UTD (no statin but being managed by  hepatology given s/p liver transplant )    Home BP is in range and weight is stable  Past Medical History:  Diagnosis Date  . Ankylosis of lumbar spine 02/21/2014  . Benign essential hypertension 10/26/2015  . BP (high blood pressure) 12/27/2010  . Chronic pain associated with significant psychosocial dysfunction 06/17/2015  . Chronic pain disorder 10/28/2015  . Degeneration of intervertebral disc of lumbar region 07/05/2013  . Degenerative disc disease, lumbar 07/26/2012  . Diabetes mellitus without complication ([place])   . Encounter for other specified prophylactic measures 10/10/2011  . Excess weight 10/10/2011  . Hearing loss   . Hepatocellular carcinoma ([place]) 12/27/2010   Overview:  Embolized 6/12   . History of liver transplant ([place]) 07/20/2011   Overview:  06/07/2011 for cryptogenic cirrhosis and HCC   . Hypertension   . Liver disease   . Lumbar radiculopathy 10/10/2012  . Nerve root pain 06/17/2015   Overview:  Right   . SOB (shortness of breath) on exertion 10/28/2015  . Swelling   . Type 2 diabetes mellitus ([place]) 12/27/2010   Overview:  H1C 6.5 12/2010     Past Surgical History:  Procedure Laterality Date  . BACK SURGERY    . BAKER CYST SURGERY     RIGHT LEG  . HERNIA REPAIR    . LIVER SURGERY     IMPLANT  . LIVER TRANSPLANT    . TONSILL SURGERY  Current Medications: Current Meds  Medication Sig  . allopurinol (ZYLOPRIM) 300 MG tablet TAKE 1 TABLET BY MOUTH ONCE (1) DAILY  . aspirin 81 MG tablet Take 81 mg by mouth daily.  . bumetanide (BUMEX) 2 MG tablet TAKE 1 AND 1/2 TABLETS BY MOUTH TWICE (2) DAILY UNLESS YOU WEIGH 219 OR LESS THEN TAKE 1 TABLET DAILY  . COLCRYS 0.6 MG tablet TAKE 1 TABLET BY MOUTH ONCE (1) DAILY FOR GOUT. TAKE no more THAN 5 DAYS straight IN an acute flair.  Marland Kitchen HUMULIN R U-500 KWIKPEN 500 UNIT/ML kwikpen Inject 115 units with breakfast, 115 units with dinner, additional 40 units with snacks. Needs 8 pens per month.  . losartan (COZAAR) 25 MG tablet  TAKE 1 TABLET BY MOUTH ONCE (1) DAILY  . metolazone (ZAROXOLYN) 5 MG tablet Take 1 tablet (5 mg total) once a week by mouth.  . Multiple Vitamin (MULTIVITAMIN) tablet Take 1 tablet by mouth daily.  . mycophenolate (CELLCEPT) 250 MG capsule TAKE 3 CAPSULES BY MOUTH TWICE (2) DAILY  . tacrolimus (PROGRAF) 0.5 MG capsule Take 0.5 capsules by mouth as directed. Takes 2 capsules in the am, and 1 at night     Allergies:   Iodinated diagnostic agents; Latex; Metformin and related; and Rosiglitazone   Social History   Socioeconomic History  . Marital status: Married    Spouse name: Not on file  . Number of children: Not on file  . Years of education: Not on file  . Highest education level: Not on file  Social Needs  . Financial resource strain: Not on file  . Food insecurity - worry: Not on file  . Food insecurity - inability: Not on file  . Transportation needs - medical: Not on file  . Transportation needs - non-medical: Not on file  Occupational History  . Not on file  Tobacco Use  . Smoking status: Never Smoker  . Smokeless tobacco: Never Used  Substance and Sexual Activity  . Alcohol use: No    Alcohol/week: 0.0 oz  . Drug use: No  . Sexual activity: Not on file  Other Topics Concern  . Not on file  Social History Narrative  . Not on file     Family History: The patient's family history includes Cancer in his mother; Diabetes in his father; Heart attack in his father; Hypertension in his brother. ROS:   Please see the history of present illness.    All other systems reviewed and are negative.  EKGs/Labs/Other Studies Reviewed:    The following studies were reviewed today  Recent Labs: [facility] 01/25/17: CBC normal, CMP with Cr 1.3 Bili 1.9   TSH T4 are normal 06/02/16: Chol 96, LDL 18.6 HDL 21  Physical Exam:    VS:  Wt 228 lb 12.8 oz (103.8 kg)   BMI 32.83 kg/m     Wt Readings from Last 3 Encounters:  03/27/17 228 lb 12.8 oz (103.8 kg)  12/27/16 231 lb 1.9 oz  (104.8 kg)  09/26/16 223 lb 1.9 oz (101.2 kg)     GEN:  Well nourished, well developed in no acute distress HEENT: Normal NECK: No JVD; No carotid bruits LYMPHATICS: No lymphadenopathy CARDIAC: RRR, no murmurs, rubs, gallops RESPIRATORY:  Clear to auscultation without rales, wheezing or rhonchi  ABDOMEN: Soft, non-tender, non-distended MUSCULOSKELETAL:  No edema; No deformity  SKIN: Warm and dry NEUROLOGIC:  Alert and oriented x 3 PSYCHIATRIC:  Normal affect    Signed, Shirlee More, MD  03/27/2017 10:04 AM  [facility]

## 2017-03-27 ENCOUNTER — Ambulatory Visit: Payer: Medicare Other | Admitting: Cardiology

## 2017-03-27 ENCOUNTER — Encounter: Payer: Self-pay | Admitting: Cardiology

## 2017-03-27 VITALS — BP 130/68 | HR 92 | Ht 69.5 in | Wt 228.8 lb

## 2017-03-27 DIAGNOSIS — N183 Chronic kidney disease, stage 3 unspecified: Secondary | ICD-10-CM

## 2017-03-27 DIAGNOSIS — I5032 Chronic diastolic (congestive) heart failure: Secondary | ICD-10-CM

## 2017-03-27 DIAGNOSIS — I110 Hypertensive heart disease with heart failure: Secondary | ICD-10-CM

## 2017-03-27 DIAGNOSIS — N186 End stage renal disease: Secondary | ICD-10-CM

## 2017-03-27 HISTORY — DX: End stage renal disease: N18.6

## 2017-03-27 HISTORY — DX: Chronic kidney disease, stage 3 unspecified: N18.30

## 2017-03-27 NOTE — Patient Instructions (Signed)
Medication Instructions:  Your physician recommends that you continue on your current medications as directed. Please refer to the Current Medication list given to you today.   Labwork: NONE  Testing/Procedures: NONE  Follow-Up: Your physician wants you to follow-up in: July 2019.   You will receive a reminder letter in the mail two months in advance. If you don't receive a letter, please call our office to schedule the follow-up appointment.   Any Other Special Instructions Will Be Listed Below (If Applicable).     If you need a refill on your cardiac medications before your next appointment, please call your pharmacy.

## 2017-04-17 DIAGNOSIS — Z944 Liver transplant status: Secondary | ICD-10-CM | POA: Diagnosis not present

## 2017-04-17 DIAGNOSIS — D899 Disorder involving the immune mechanism, unspecified: Secondary | ICD-10-CM | POA: Diagnosis not present

## 2017-04-21 DIAGNOSIS — E1165 Type 2 diabetes mellitus with hyperglycemia: Secondary | ICD-10-CM | POA: Diagnosis not present

## 2017-04-21 DIAGNOSIS — Z794 Long term (current) use of insulin: Secondary | ICD-10-CM | POA: Diagnosis not present

## 2017-06-19 DIAGNOSIS — E119 Type 2 diabetes mellitus without complications: Secondary | ICD-10-CM | POA: Diagnosis not present

## 2017-07-31 DIAGNOSIS — Z944 Liver transplant status: Secondary | ICD-10-CM | POA: Diagnosis not present

## 2017-07-31 DIAGNOSIS — Z794 Long term (current) use of insulin: Secondary | ICD-10-CM | POA: Diagnosis not present

## 2017-07-31 DIAGNOSIS — E1165 Type 2 diabetes mellitus with hyperglycemia: Secondary | ICD-10-CM | POA: Diagnosis not present

## 2017-07-31 DIAGNOSIS — Z0000 Encounter for general adult medical examination without abnormal findings: Secondary | ICD-10-CM | POA: Diagnosis not present

## 2017-08-08 DIAGNOSIS — Z1339 Encounter for screening examination for other mental health and behavioral disorders: Secondary | ICD-10-CM | POA: Diagnosis not present

## 2017-08-08 DIAGNOSIS — Z1331 Encounter for screening for depression: Secondary | ICD-10-CM | POA: Diagnosis not present

## 2017-08-08 DIAGNOSIS — Z0000 Encounter for general adult medical examination without abnormal findings: Secondary | ICD-10-CM | POA: Diagnosis not present

## 2017-08-08 DIAGNOSIS — Z6831 Body mass index (BMI) 31.0-31.9, adult: Secondary | ICD-10-CM | POA: Diagnosis not present

## 2017-08-26 ENCOUNTER — Other Ambulatory Visit: Payer: Self-pay | Admitting: Cardiology

## 2017-09-26 DIAGNOSIS — E119 Type 2 diabetes mellitus without complications: Secondary | ICD-10-CM | POA: Diagnosis not present

## 2017-09-26 DIAGNOSIS — Z794 Long term (current) use of insulin: Secondary | ICD-10-CM | POA: Diagnosis not present

## 2017-10-17 NOTE — Progress Notes (Signed)
Cardiology Office Note:    Date:  10/18/2017   ID:  Gregory Tanner, DOB 01/12/1956, MRN [id number]  PCP:  Ronita Hipps, MD  Cardiologist:  Shirlee More, MD    Referring MD: Ronita Hipps, MD    ASSESSMENT:    1. Hypertensive heart disease with heart failure ([place])   2. Chronic diastolic heart failure ([place])   3. CKD (chronic kidney disease) stage 3, GFR 30-59 ml/min (HCC)    PLAN:    In order of problems listed above:  1. Stable he does a great job of caring for himself his weights are stable sodium restricts meticulous with his diuretic and I read it is prescription saying get a full 90 days.  Recent labs reviewed from [facility] and his creatinine is stable 1.4 cholesterol 99 HDL 22 LDL 32.  We will consider echocardiogram at his next visit 2-year duration provided he stable I will see now every 6 months.  He tells me labs are done every 3 months through his PCP and to the [facility] transplant program.  His hypertension is stable he has intermittent cough that could be related to his ARB but at this time does not want to change medications 2. See above stable no fluid overload New York Heart Association class I to class II 3. Stable continue his current diuretic   Next appointment: 6 months   Medication Adjustments/Labs and Tests Ordered: Current medicines are reviewed at length with the patient today.  Concerns regarding medicines are outlined above.  No orders of the defined types were placed in this encounter.  Meds ordered this encounter  Medications  . bumetanide (BUMEX) 2 MG tablet    Sig: Take 1 tablet (2 mg total) by mouth 2 (two) times daily. Take and extra 1/2 = 1 mg in the AM    Dispense:  225 tablet    Refill:  3    Chief Complaint  Patient presents with  . Follow-up  . Congestive Heart Failure    History of Present Illness:    Gregory Tanner is a 62 y.o. male with a hx of liver transplantation, hypertension and heart failure     last seen 12/27/16. Compliance  with diet, lifestyle and medications: Yes  His weights remain stable no edema he has mild shortness of breath when he climbs stairs no orthopnea chest pain palpitation or syncope that is intermittent paroxysms of cough takes an ARB.  Recent labs reviewed and he continues to follow in the [facility] transplant program Past Medical History:  Diagnosis Date  . Ankylosis of lumbar spine 02/21/2014  . Benign essential hypertension 10/26/2015  . BP (high blood pressure) 12/27/2010  . Chronic pain associated with significant psychosocial dysfunction 06/17/2015  . Chronic pain disorder 10/28/2015  . Degeneration of intervertebral disc of lumbar region 07/05/2013  . Degenerative disc disease, lumbar 07/26/2012  . Diabetes mellitus without complication ([place])   . Encounter for other specified prophylactic measures 10/10/2011  . Excess weight 10/10/2011  . Hearing loss   . Hepatocellular carcinoma ([place]) 12/27/2010   Overview:  Embolized 6/12   . History of liver transplant ([place]) 07/20/2011   Overview:  06/07/2011 for cryptogenic cirrhosis and HCC   . Hypertension   . Liver disease   . Lumbar radiculopathy 10/10/2012  . Nerve root pain 06/17/2015   Overview:  Right   . SOB (shortness of breath) on exertion 10/28/2015  . Swelling   . Type 2 diabetes mellitus ([place]) 12/27/2010  Overview:  H1C 6.5 12/2010     Past Surgical History:  Procedure Laterality Date  . BACK SURGERY    . BAKER CYST SURGERY     RIGHT LEG  . HERNIA REPAIR    . LIVER SURGERY     IMPLANT  . LIVER TRANSPLANT    . TONSILL SURGERY      Current Medications: Current Meds  Medication Sig  . allopurinol (ZYLOPRIM) 300 MG tablet TAKE 1 TABLET BY MOUTH ONCE (1) DAILY  . aspirin 81 MG tablet Take 81 mg by mouth daily.  . bumetanide (BUMEX) 2 MG tablet Take 1 tablet (2 mg total) by mouth 2 (two) times daily. Take and extra 1/2 = 1 mg in the AM  . COLCRYS 0.6 MG tablet TAKE 1 TABLET BY MOUTH ONCE (1) DAILY FOR GOUT. TAKE no more THAN 5 DAYS  straight IN an acute flair.  Marland Kitchen HUMULIN R U-500 KWIKPEN 500 UNIT/ML kwikpen Inject 115 units with breakfast, 115 units with dinner, additional 40 units with snacks. Needs 8 pens per month.  . losartan (COZAAR) 25 MG tablet TAKE 1 TABLET BY MOUTH ONCE (1) DAILY  . metolazone (ZAROXOLYN) 5 MG tablet TAKE 1 TABLET BY MOUTH ONCE (1)  A WEEK. TAKE 45 MINUTES prior TO Bumex dose  . Multiple Vitamin (MULTIVITAMIN) tablet Take 1 tablet by mouth daily.  . mycophenolate (CELLCEPT) 250 MG capsule TAKE 3 CAPSULES BY MOUTH TWICE (2) DAILY  . tacrolimus (PROGRAF) 0.5 MG capsule Take 0.5 capsules by mouth as directed. Takes 2 capsules in the am, and 1 at night  . [DISCONTINUED] bumetanide (BUMEX) 2 MG tablet TAKE 1 AND 1/2 TABLETS BY MOUTH TWICE (2) DAILY UNLESS YOU WEIGH 219 OR LESS THEN TAKE 1 TABLET DAILY (Patient taking differently: Take 2 mg by mouth 2 (two) times daily. Take and extra 1/2 = 1 mg in the AM)     Allergies:   Iodinated diagnostic agents; Latex; Metformin and related; and Rosiglitazone   Social History   Socioeconomic History  . Marital status: Married    Spouse name: Not on file  . Number of children: Not on file  . Years of education: Not on file  . Highest education level: Not on file  Occupational History  . Not on file  Social Needs  . Financial resource strain: Not on file  . Food insecurity:    Worry: Not on file    Inability: Not on file  . Transportation needs:    Medical: Not on file    Non-medical: Not on file  Tobacco Use  . Smoking status: Never Smoker  . Smokeless tobacco: Never Used  Substance and Sexual Activity  . Alcohol use: No    Alcohol/week: 0.0 standard drinks  . Drug use: No  . Sexual activity: Not on file  Lifestyle  . Physical activity:    Days per week: Not on file    Minutes per session: Not on file  . Stress: Not on file  Relationships  . Social connections:    Talks on phone: Not on file    Gets together: Not on file    Attends religious  service: Not on file    Active member of club or organization: Not on file    Attends meetings of clubs or organizations: Not on file    Relationship status: Not on file  Other Topics Concern  . Not on file  Social History Narrative  . Not on file  Family History: The patient's family history includes Cancer in his mother; Diabetes in his father; Heart attack in his father; Hypertension in his brother. ROS:   Please see the history of present illness.    All other systems reviewed and are negative.  EKGs/Labs/Other Studies Reviewed:    The following studies were reviewed today:  EKG:  EKG ordered today.  The ekg ordered today demonstrates sinus rhythm right bundle branch block left axis deviation  Recent Labs: No results found for requested labs within last 8760 hours.  Recent Lipid Panel No results found for: CHOL, TRIG, HDL, CHOLHDL, VLDL, LDLCALC, LDLDIRECT  Physical Exam:    VS:  BP (!) 134/58   Pulse 94   Ht 5' 9.5" (1.765 m)   Wt 224 lb 9.6 oz (101.9 kg)   SpO2 95%   BMI 32.69 kg/m     Wt Readings from Last 3 Encounters:  10/18/17 224 lb 9.6 oz (101.9 kg)  03/27/17 228 lb 12.8 oz (103.8 kg)  12/27/16 231 lb 1.9 oz (104.8 kg)     GEN:  Well nourished, well developed in no acute distress HEENT: Normal NECK: No JVD; No carotid bruits LYMPHATICS: No lymphadenopathy CARDIAC: RRR, no murmurs, rubs, gallops RESPIRATORY:  Clear to auscultation without rales, wheezing or rhonchi  ABDOMEN: Soft, non-tender, non-distended MUSCULOSKELETAL:  No edema; No deformity marked varicosities SKIN: Warm and dry NEUROLOGIC:  Alert and oriented x 3 PSYCHIATRIC:  Normal affect    Signed, Shirlee More, MD  10/18/2017 10:52 AM    [place]

## 2017-10-18 ENCOUNTER — Encounter: Payer: Self-pay | Admitting: Cardiology

## 2017-10-18 ENCOUNTER — Ambulatory Visit: Payer: Medicare Other | Admitting: Cardiology

## 2017-10-18 VITALS — BP 134/58 | HR 94 | Ht 69.5 in | Wt 224.6 lb

## 2017-10-18 DIAGNOSIS — I5032 Chronic diastolic (congestive) heart failure: Secondary | ICD-10-CM | POA: Diagnosis not present

## 2017-10-18 DIAGNOSIS — I110 Hypertensive heart disease with heart failure: Secondary | ICD-10-CM

## 2017-10-18 DIAGNOSIS — N183 Chronic kidney disease, stage 3 unspecified: Secondary | ICD-10-CM

## 2017-10-18 MED ORDER — BUMETANIDE 2 MG PO TABS: 2.0000 mg | ORAL_TABLET | Freq: Two times a day (BID) | ORAL | 3 refills | Status: DC

## 2017-10-18 NOTE — Patient Instructions (Signed)

## 2017-11-10 DIAGNOSIS — Z944 Liver transplant status: Secondary | ICD-10-CM | POA: Diagnosis not present

## 2017-11-10 DIAGNOSIS — D899 Disorder involving the immune mechanism, unspecified: Secondary | ICD-10-CM | POA: Diagnosis not present

## 2017-11-15 DIAGNOSIS — Z23 Encounter for immunization: Secondary | ICD-10-CM | POA: Diagnosis not present

## 2017-11-20 DIAGNOSIS — E1165 Type 2 diabetes mellitus with hyperglycemia: Secondary | ICD-10-CM | POA: Diagnosis not present

## 2017-11-20 DIAGNOSIS — Z794 Long term (current) use of insulin: Secondary | ICD-10-CM | POA: Diagnosis not present

## 2017-12-16 DIAGNOSIS — L570 Actinic keratosis: Secondary | ICD-10-CM | POA: Diagnosis not present

## 2017-12-18 DIAGNOSIS — E119 Type 2 diabetes mellitus without complications: Secondary | ICD-10-CM | POA: Diagnosis not present

## 2017-12-18 DIAGNOSIS — G629 Polyneuropathy, unspecified: Secondary | ICD-10-CM | POA: Diagnosis not present

## 2018-01-18 DIAGNOSIS — H524 Presbyopia: Secondary | ICD-10-CM | POA: Diagnosis not present

## 2018-01-18 DIAGNOSIS — H33312 Horseshoe tear of retina without detachment, left eye: Secondary | ICD-10-CM | POA: Diagnosis not present

## 2018-01-18 DIAGNOSIS — H35372 Puckering of macula, left eye: Secondary | ICD-10-CM | POA: Diagnosis not present

## 2018-01-18 DIAGNOSIS — E113293 Type 2 diabetes mellitus with mild nonproliferative diabetic retinopathy without macular edema, bilateral: Secondary | ICD-10-CM | POA: Diagnosis not present

## 2018-01-18 DIAGNOSIS — H33322 Round hole, left eye: Secondary | ICD-10-CM | POA: Diagnosis not present

## 2018-02-01 DIAGNOSIS — H33312 Horseshoe tear of retina without detachment, left eye: Secondary | ICD-10-CM | POA: Diagnosis not present

## 2018-02-01 DIAGNOSIS — H35431 Paving stone degeneration of retina, right eye: Secondary | ICD-10-CM | POA: Diagnosis not present

## 2018-02-01 DIAGNOSIS — H33322 Round hole, left eye: Secondary | ICD-10-CM | POA: Diagnosis not present

## 2018-02-12 DIAGNOSIS — D899 Disorder involving the immune mechanism, unspecified: Secondary | ICD-10-CM | POA: Diagnosis not present

## 2018-03-09 DIAGNOSIS — E109 Type 1 diabetes mellitus without complications: Secondary | ICD-10-CM | POA: Diagnosis not present

## 2018-03-09 DIAGNOSIS — E1165 Type 2 diabetes mellitus with hyperglycemia: Secondary | ICD-10-CM | POA: Diagnosis not present

## 2018-03-09 DIAGNOSIS — Z794 Long term (current) use of insulin: Secondary | ICD-10-CM | POA: Diagnosis not present

## 2018-03-22 DIAGNOSIS — E119 Type 2 diabetes mellitus without complications: Secondary | ICD-10-CM | POA: Diagnosis not present

## 2018-03-22 DIAGNOSIS — Z794 Long term (current) use of insulin: Secondary | ICD-10-CM | POA: Diagnosis not present

## 2018-03-29 ENCOUNTER — Other Ambulatory Visit: Payer: Self-pay | Admitting: Cardiology

## 2018-04-10 ENCOUNTER — Telehealth: Payer: Self-pay | Admitting: Cardiology

## 2018-04-28 NOTE — Progress Notes (Deleted)
Cardiology Office Note:    Date:  04/28/2018   ID:  Gregory Tanner, DOB [birth year], MRN [id number]  PCP:  Ronita Hipps, MD  Cardiologist:  Shirlee More, MD    Referring MD: Ronita Hipps, MD    ASSESSMENT:    No diagnosis found. PLAN:    In order of problems listed above:  1. ***   Next appointment: ***   Medication Adjustments/Labs and Tests Ordered: Current medicines are reviewed at length with the patient today.  Concerns regarding medicines are outlined above.  No orders of the defined types were placed in this encounter.  No orders of the defined types were placed in this encounter.   No chief complaint on file.   History of Present Illness:    Gregory Tanner is a 63 y.o. male with a hx of liver transplantation, hypertension and heart failure last seen 10/18/17. Compliance with diet, lifestyle and medications: *** Past Medical History:  Diagnosis Date  . Ankylosis of lumbar spine 02/21/2014  . Benign essential hypertension 10/26/2015  . BP (high blood pressure) 12/27/2010  . Chronic pain associated with significant psychosocial dysfunction 06/17/2015  . Chronic pain disorder 10/28/2015  . Degeneration of intervertebral disc of lumbar region 07/05/2013  . Degenerative disc disease, lumbar 07/26/2012  . Diabetes mellitus without complication ([place])   . Encounter for other specified prophylactic measures 10/10/2011  . Excess weight 10/10/2011  . Hearing loss   . Hepatocellular carcinoma ([place]) 12/27/2010   Overview:  Embolized 6/12   . History of liver transplant ([place]) 07/20/2011   Overview:  06/07/2011 for cryptogenic cirrhosis and HCC   . Hypertension   . Liver disease   . Lumbar radiculopathy 10/10/2012  . Nerve root pain 06/17/2015   Overview:  Right   . SOB (shortness of breath) on exertion 10/28/2015  . Swelling   . Type 2 diabetes mellitus ([place]) 12/27/2010   Overview:  H1C 6.5 12/2010     Past Surgical History:  Procedure Laterality Date  . BACK  SURGERY    . BAKER CYST SURGERY     RIGHT LEG  . HERNIA REPAIR    . LIVER SURGERY     IMPLANT  . LIVER TRANSPLANT    . TONSILL SURGERY      Current Medications: No outpatient medications have been marked as taking for the 04/30/18 encounter (Appointment) with Richardo Priest, MD.     Allergies:   Iodinated diagnostic agents; Latex; Metformin and related; and Rosiglitazone   Social History   Socioeconomic History  . Marital status: Married    Spouse name: Not on file  . Number of children: Not on file  . Years of education: Not on file  . Highest education level: Not on file  Occupational History  . Not on file  Social Needs  . Financial resource strain: Not on file  . Food insecurity:    Worry: Not on file    Inability: Not on file  . Transportation needs:    Medical: Not on file    Non-medical: Not on file  Tobacco Use  . Smoking status: Never Smoker  . Smokeless tobacco: Never Used  Substance and Sexual Activity  . Alcohol use: No    Alcohol/week: 0.0 standard drinks  . Drug use: No  . Sexual activity: Not on file  Lifestyle  . Physical activity:    Days per week: Not on file    Minutes per session: Not on file  . Stress:  Not on file  Relationships  . Social connections:    Talks on phone: Not on file    Gets together: Not on file    Attends religious service: Not on file    Active member of club or organization: Not on file    Attends meetings of clubs or organizations: Not on file    Relationship status: Not on file  Other Topics Concern  . Not on file  Social History Narrative  . Not on file     Family History: The patient's ***family history includes Cancer in his mother; Diabetes in his father; Heart attack in his father; Hypertension in his brother. ROS:   Please see the history of present illness.    All other systems reviewed and are negative.  EKGs/Labs/Other Studies Reviewed:    The following studies were reviewed today:  EKG:  EKG  ordered today and personally reviewed.  The ekg ordered today demonstrates ***  Recent Labs:   At [facility] 02/12/2018 bilirubin 1.5 potassium 3.6 creatinine 1.3 TSH normal 1.79 free T4 normal 1.00 hemoglobin 15.0 No results found for requested labs within last 8760 hours.  Recent Lipid Panel   at [facility] 07/31/2017 cholesterol 99 LDL 22 No results found for: CHOL, TRIG, HDL, CHOLHDL, VLDL, LDLCALC, LDLDIRECT  Physical Exam:    VS:  There were no vitals taken for this visit.    Wt Readings from Last 3 Encounters:  10/18/17 224 lb 9.6 oz (101.9 kg)  03/27/17 228 lb 12.8 oz (103.8 kg)  12/27/16 231 lb 1.9 oz (104.8 kg)     GEN: *** Well nourished, well developed in no acute distress HEENT: Normal NECK: No JVD; No carotid bruits LYMPHATICS: No lymphadenopathy CARDIAC: ***RRR, no murmurs, rubs, gallops RESPIRATORY:  Clear to auscultation without rales, wheezing or rhonchi  ABDOMEN: Soft, non-tender, non-distended MUSCULOSKELETAL:  No edema; No deformity  SKIN: Warm and dry NEUROLOGIC:  Alert and oriented x 3 PSYCHIATRIC:  Normal affect    Signed, Shirlee More, MD  04/28/2018 1:25 PM    [facility]

## 2018-04-30 ENCOUNTER — Ambulatory Visit: Payer: Medicare Other | Admitting: Cardiology

## 2018-06-19 DIAGNOSIS — E1165 Type 2 diabetes mellitus with hyperglycemia: Secondary | ICD-10-CM | POA: Diagnosis not present

## 2018-06-19 DIAGNOSIS — Z794 Long term (current) use of insulin: Secondary | ICD-10-CM | POA: Diagnosis not present

## 2018-06-21 DIAGNOSIS — E1165 Type 2 diabetes mellitus with hyperglycemia: Secondary | ICD-10-CM

## 2018-06-21 DIAGNOSIS — IMO0002 Reserved for concepts with insufficient information to code with codable children: Secondary | ICD-10-CM

## 2018-06-21 HISTORY — DX: Type 2 diabetes mellitus with hyperglycemia: E11.65

## 2018-06-21 HISTORY — DX: Reserved for concepts with insufficient information to code with codable children: IMO0002

## 2018-06-25 DIAGNOSIS — E109 Type 1 diabetes mellitus without complications: Secondary | ICD-10-CM | POA: Diagnosis not present

## 2018-06-25 DIAGNOSIS — Z794 Long term (current) use of insulin: Secondary | ICD-10-CM | POA: Diagnosis not present

## 2018-06-25 DIAGNOSIS — E1165 Type 2 diabetes mellitus with hyperglycemia: Secondary | ICD-10-CM | POA: Diagnosis not present

## 2018-07-16 ENCOUNTER — Other Ambulatory Visit: Payer: Self-pay | Admitting: Cardiology

## 2018-07-16 ENCOUNTER — Telehealth: Payer: Self-pay | Admitting: Cardiology

## 2018-07-16 NOTE — Telephone Encounter (Signed)
Virtual Visit Pre-Appointment Phone Call  "(Name), I am calling you today to discuss your upcoming appointment. We are currently trying to limit exposure to the virus that causes COVID-19 by seeing patients at home rather than in the office."  1. "What is the BEST phone number to call the day of the visit?" - include this in appointment notes  2. Do you have or have access to (through a family member/friend) a smartphone with video capability that we can use for your visit?" a. If yes - list this number in appt notes as cell (if different from BEST phone #) and list the appointment type as a VIDEO visit in appointment notes b. If no - list the appointment type as a PHONE visit in appointment notes  3. Confirm consent - "In the setting of the current Covid19 crisis, you are scheduled for a (phone or video) visit with your provider on (date) at (time).  Just as we do with many in-office visits, in order for you to participate in this visit, we must obtain consent.  If you'd like, I can send this to your mychart (if signed up) or email for you to review.  Otherwise, I can obtain your verbal consent now.  All virtual visits are billed to your insurance company just like a normal visit would be.  By agreeing to a virtual visit, we'd like you to understand that the technology does not allow for your provider to perform an examination, and thus may limit your provider's ability to fully assess your condition. If your provider identifies any concerns that need to be evaluated in person, we will make arrangements to do so.  Finally, though the technology is pretty good, we cannot assure that it will always work on either your or our end, and in the setting of a video visit, we may have to convert it to a phone-only visit.  In either situation, we cannot ensure that we have a secure connection.  Are you willing to proceed?" STAFF: Did the patient verbally acknowledge consent to telehealth visit? Document  YES/NO here: Yes per pt.  4. Advise patient to be prepared - "Two hours prior to your appointment, go ahead and check your blood pressure, pulse, oxygen saturation, and your weight (if you have the equipment to check those) and write them all down. When your visit starts, your provider will ask you for this information. If you have an Apple Watch or Kardia device, please plan to have heart rate information ready on the day of your appointment. Please have a pen and paper handy nearby the day of the visit as well."  5. Give patient instructions for MyChart download to smartphone OR [email or web address] as below if video visit (depending on what platform provider is using)  6. Inform patient they will receive a phone call 15 minutes prior to their appointment time (may be from unknown caller ID) so they should be prepared to answer    TELEPHONE CALL NOTE  Gregory Tanner has been deemed a candidate for a follow-up tele-health visit to limit community exposure during the Covid-19 pandemic. I spoke with the patient via phone to ensure availability of phone/video source, confirm preferred email & phone number, and discuss instructions and expectations.  I reminded Gregory Tanner to be prepared with any vital sign and/or heart rhythm information that could potentially be obtained via home monitoring, at the time of his visit. I reminded Gregory Tanner to expect a phone call  prior to his visit.  Calla Kicks 07/16/2018 2:48 PM   INSTRUCTIONS FOR DOWNLOADING THE MYCHART APP TO SMARTPHONE  - The patient must first make sure to have activated MyChart and know their login information - If Apple, go to [employer] and type in MyChart in the search bar and download the app. If Android, ask patient to go to [employer] and type in [place] in the search bar and download the app. The app is free but as with any other app downloads, their phone may require them to verify saved payment information or  Apple/Android password.  - The patient will need to then log into the app with their MyChart username and password, and select [facility] as their healthcare provider to link the account. When it is time for your visit, go to the MyChart app, find appointments, and click Begin Video Visit. Be sure to Select Allow for your device to access the Microphone and Camera for your visit. You will then be connected, and your provider will be with you shortly.  **If they have any issues connecting, or need assistance please contact MyChart service desk [phone])**  **If using a computer, in order to ensure the best quality for their visit they will need to use either of the following Internet Browsers: [employer], or Google Chrome**  IF USING DOXIMITY or DOXY.ME - The patient will receive a link just prior to their visit by text.     FULL LENGTH CONSENT FOR TELE-HEALTH VISIT   I hereby voluntarily request, consent and authorize [facility] and its employed or contracted physicians, physician assistants, nurse practitioners or other licensed health care professionals (the Practitioner), to provide me with telemedicine health care services (the Services") as deemed necessary by the treating Practitioner. I acknowledge and consent to receive the Services by the Practitioner via telemedicine. I understand that the telemedicine visit will involve communicating with the Practitioner through live audiovisual communication technology and the disclosure of certain medical information by electronic transmission. I acknowledge that I have been given the opportunity to request an in-person assessment or other available alternative prior to the telemedicine visit and am voluntarily participating in the telemedicine visit.  I understand that I have the right to withhold or withdraw my consent to the use of telemedicine in the course of my care at any time, without affecting my right to future care  or treatment, and that the Practitioner or I may terminate the telemedicine visit at any time. I understand that I have the right to inspect all information obtained and/or recorded in the course of the telemedicine visit and may receive copies of available information for a reasonable fee.  I understand that some of the potential risks of receiving the Services via telemedicine include:   Delay or interruption in medical evaluation due to technological equipment failure or disruption;  Information transmitted may not be sufficient (e.g. poor resolution of images) to allow for appropriate medical decision making by the Practitioner; and/or   In rare instances, security protocols could fail, causing a breach of personal health information.  Furthermore, I acknowledge that it is my responsibility to provide information about my medical history, conditions and care that is complete and accurate to the best of my ability. I acknowledge that Practitioner's advice, recommendations, and/or decision may be based on factors not within their control, such as incomplete or inaccurate data provided by me or distortions of diagnostic images or specimens that may result from electronic  transmissions. I understand that the practice of medicine is not an exact science and that Practitioner makes no warranties or guarantees regarding treatment outcomes. I acknowledge that I will receive a copy of this consent concurrently upon execution via email to the email address I last provided but may also request a printed copy by calling the office of [facility].    I understand that my insurance will be billed for this visit.   I have read or had this consent read to me.  I understand the contents of this consent, which adequately explains the benefits and risks of the Services being provided via telemedicine.   I have been provided ample opportunity to ask questions regarding this consent and the Services and have had  my questions answered to my satisfaction.  I give my informed consent for the services to be provided through the use of telemedicine in my medical care  By participating in this telemedicine visit I agree to the above.

## 2018-07-17 NOTE — Progress Notes (Signed)
Virtual Visit via Video Note   This visit type was conducted due to national recommendations for restrictions regarding the COVID-19 Pandemic (e.g. social distancing) in an effort to limit this patient's exposure and mitigate transmission in our community.  Due to his co-morbid illnesses, this patient is at least at moderate risk for complications without adequate follow up.  This format is felt to be most appropriate for this patient at this time.  All issues noted in this document were discussed and addressed.  A limited physical exam was performed with this format.  Please refer to the patient's chart for his consent to telehealth for [facility].   Date:  07/18/2018   ID:  Gregory Tanner, DOB Mar 15, 1955, MRN [id number]  Patient Location: Home Provider Location: Office  PCP:  Gregory Hipps, MD  Cardiologist:  Gregory More, MD  Electrophysiologist:  None   Evaluation Performed:  Follow-Up Visit  Chief Complaint:  Heart failure  History of Present Illness:    Gregory Tanner is a 63 y.o. male with history of  a liver transplantation, hypertension with CKD and heart failure last seen 10/18/17.      The patient does not have symptoms concerning for COVID-19 infection (fever, chills, cough, or new shortness of breath).   Fortunately Gregory Tanner has very high healthcare literacy his weights are stable at home 215 pounds today highest 221 lowest to 13.6 he has minimal edema and is not having shortness of breath orthopnea.  Unfortunately his blood pressure cuff is broken and will send a prescription for him to get a new one he needs lab work done is overdue and because of following up with endocrinology at [facility] I will go ahead and check a comprehensive panel A1c along with a proBNP and a lipid level and I told him to alert the physicians at [facility] that they can look in care everywhere.  I will plan to see him back in my office face-to-face in 3 months and continue his good home self-management  with sodium restriction compliance with medications and daily weights.  If he were to deteriorate we take advantage of the home heart failure program but we have to come out and measure lung water and I think it is necessary at this time Past Medical History:  Diagnosis Date  . Ankylosis of lumbar spine 02/21/2014  . Benign essential hypertension 10/26/2015  . BP (high blood pressure) 12/27/2010  . Chronic pain associated with significant psychosocial dysfunction 06/17/2015  . Chronic pain disorder 10/28/2015  . Degeneration of intervertebral disc of lumbar region 07/05/2013  . Degenerative disc disease, lumbar 07/26/2012  . Diabetes mellitus without complication ([place])   . Encounter for other specified prophylactic measures 10/10/2011  . Excess weight 10/10/2011  . Hearing loss   . Hepatocellular carcinoma ([place]) 12/27/2010   Overview:  Embolized 6/12   . History of liver transplant ([place]) 07/20/2011   Overview:  06/07/2011 for cryptogenic cirrhosis and HCC   . Hypertension   . Liver disease   . Lumbar radiculopathy 10/10/2012  . Nerve root pain 06/17/2015   Overview:  Right   . SOB (shortness of breath) on exertion 10/28/2015  . Swelling   . Type 2 diabetes mellitus ([place]) 12/27/2010   Overview:  H1C 6.5 12/2010    Past Surgical History:  Procedure Laterality Date  . BACK SURGERY    . BAKER CYST SURGERY     RIGHT LEG  . HERNIA REPAIR    . LIVER SURGERY  IMPLANT  . LIVER TRANSPLANT    . TONSILL SURGERY       Current Meds  Medication Sig  . allopurinol (ZYLOPRIM) 300 MG tablet TAKE 1 TABLET BY MOUTH ONCE (1) DAILY  . aspirin 81 MG tablet Take 81 mg by mouth daily.  . bumetanide (BUMEX) 2 MG tablet Take 1.5 tablets in the morning and 1 tablet in the evening  . COLCRYS 0.6 MG tablet TAKE 1 TABLET BY MOUTH ONCE (1) DAILY FOR GOUT. TAKE no Tanner THAN 5 DAYS straight IN an acute flair.  Marland Kitchen HUMULIN R U-500 KWIKPEN 500 UNIT/ML kwikpen Inject 115 units with breakfast, 115 units with dinner,  additional 40 units with snacks. Needs 8 pens per month.  . losartan (COZAAR) 25 MG tablet TAKE 1 TABLET BY MOUTH ONCE (1) DAILY  . metolazone (ZAROXOLYN) 5 MG tablet TAKE 1 TABLET BY MOUTH ONCE a WEEK. TAKE 45 MINUTES prior TO bumex dose  . Multiple Vitamin (MULTIVITAMIN) tablet Take 1 tablet by mouth daily.  . mycophenolate (CELLCEPT) 250 MG capsule TAKE 3 CAPSULES BY MOUTH TWICE (2) DAILY  . tacrolimus (PROGRAF) 0.5 MG capsule Take 0.5 capsules by mouth as directed. Takes 1 capsule in the am, and 1 capsule at night     Allergies:   Iodinated diagnostic agents; Latex; Metformin and related; and Rosiglitazone   Social History   Tobacco Use  . Smoking status: Never Smoker  . Smokeless tobacco: Never Used  Substance Use Topics  . Alcohol use: No    Alcohol/week: 0.0 standard drinks  . Drug use: No     Family Hx: The patient's family history includes Cancer in his mother; Diabetes in his father; Heart attack in his father; Hypertension in his brother.  ROS:   Please see the history of present illness.     All other systems reviewed and are negative.   Prior CV studies:   The following studies were reviewed today:    Labs/Other Tests and Data Reviewed:    EKG:  No ECG reviewed.  Recent Labs: 02/12/18:  Hgb 15.0 Bili 1.5 Cr 1.3 K 3.6 GFR 57 cc/min 07/31/17:   Chol 99 HDL 22 LDL 33  Wt Readings from Last 3 Encounters:  07/18/18 215 lb (97.5 kg)  10/18/17 224 lb 9.6 oz (101.9 kg)  03/27/17 228 lb 12.8 oz (103.8 kg)     Objective:    Vital Signs:  Wt 215 lb (97.5 kg)   BMI 31.29 kg/m    VITAL SIGNS:  reviewed his mood affect thought and cognition are normal no respiratory distress  ASSESSMENT & PLAN:    1. Heart failure chronic diastolic compensated he will continue his current combination of loop diuretic and metolazone as well as home self-management 2. Hypertensive heart disease continue current treatment he needs to purchase a cuff for home self measurements  3. CKD recheck renal function potassium and proBNP level 4. I reinforced this social distancing wearing a mask in public handwashing technique as he is at high risk with immunosuppression after liver transplantation  COVID-19 Education: The signs and symptoms of COVID-19 were discussed with the patient and how to seek care for testing (follow up with PCP or arrange E-visit).  The importance of social distancing was discussed today.  Time:   Today, I have spent 20 minutes with the patient with telehealth technology discussing the above problems.     Medication Adjustments/Labs and Tests Ordered: Current medicines are reviewed at length with the patient today.  Concerns  regarding medicines are outlined above.   Tests Ordered: No orders of the defined types were placed in this encounter.   Medication Changes: No orders of the defined types were placed in this encounter.   Disposition:  Follow up 3 mos  Signed, Gregory More, MD  07/18/2018 9:02 AM    [place]

## 2018-07-18 ENCOUNTER — Other Ambulatory Visit: Payer: Self-pay

## 2018-07-18 ENCOUNTER — Encounter: Payer: Self-pay | Admitting: Cardiology

## 2018-07-18 ENCOUNTER — Other Ambulatory Visit: Payer: Self-pay | Admitting: Cardiology

## 2018-07-18 ENCOUNTER — Telehealth (INDEPENDENT_AMBULATORY_CARE_PROVIDER_SITE_OTHER): Payer: Medicare Other | Admitting: Cardiology

## 2018-07-18 VITALS — Wt 215.0 lb

## 2018-07-18 DIAGNOSIS — I110 Hypertensive heart disease with heart failure: Secondary | ICD-10-CM

## 2018-07-18 DIAGNOSIS — Z131 Encounter for screening for diabetes mellitus: Secondary | ICD-10-CM

## 2018-07-18 DIAGNOSIS — N183 Chronic kidney disease, stage 3 unspecified: Secondary | ICD-10-CM

## 2018-07-18 DIAGNOSIS — I5032 Chronic diastolic (congestive) heart failure: Secondary | ICD-10-CM

## 2018-07-18 DIAGNOSIS — Z7189 Other specified counseling: Secondary | ICD-10-CM

## 2018-07-18 NOTE — Patient Instructions (Signed)
Medication Instructions:  Your physician recommends that you continue on your current medications as directed. Please refer to the Current Medication list given to you today.  If you need a refill on your cardiac medications before your next appointment, please call your pharmacy.   Lab work: Your physician recommends that you return for lab work in: NEXT 1-2 DAYS LIPID,CBC,A1C,CMP,Pro BNP  If you have labs (blood work) drawn today and your tests are completely normal, you will receive your results only by: Marland Kitchen MyChart Message (if you have MyChart) OR . A paper copy in the mail If you have any lab test that is abnormal or we need to change your treatment, we will call you to review the results.  Testing/Procedures: None  Follow-Up: At [facility], you and your health needs are our priority.  As part of our continuing mission to provide you with exceptional heart care, we have created designated Provider Care Teams.  These Care Teams include your primary Cardiologist (physician) and Advanced Practice Providers (APPs -  Physician Assistants and Nurse Practitioners) who all work together to provide you with the care you need, when you need it. You will need a follow up appointment in 3 months.   Any Other Special Instructions Will Be Listed Below (If Applicable).

## 2018-07-19 DIAGNOSIS — I110 Hypertensive heart disease with heart failure: Secondary | ICD-10-CM | POA: Diagnosis not present

## 2018-07-19 DIAGNOSIS — Z131 Encounter for screening for diabetes mellitus: Secondary | ICD-10-CM | POA: Diagnosis not present

## 2018-07-19 DIAGNOSIS — I5032 Chronic diastolic (congestive) heart failure: Secondary | ICD-10-CM | POA: Diagnosis not present

## 2018-07-20 ENCOUNTER — Telehealth: Payer: Self-pay | Admitting: *Deleted

## 2018-07-20 LAB — CBC
Hematocrit: 45 % (ref 37.5–51.0)
Hemoglobin: 16 g/dL (ref 13.0–17.7)
MCH: 32.5 pg (ref 26.6–33.0)
MCHC: 35.6 g/dL (ref 31.5–35.7)
MCV: 92 fL (ref 79–97)
Platelets: 125 10*3/uL — ABNORMAL LOW (ref 150–450)
RBC: 4.92 x10E6/uL (ref 4.14–5.80)
RDW: 15.7 % — ABNORMAL HIGH (ref 11.6–15.4)
WBC: 7.9 10*3/uL (ref 3.4–10.8)

## 2018-07-20 LAB — COMPREHENSIVE METABOLIC PANEL
ALT: 23 IU/L (ref 0–44)
AST: 30 IU/L (ref 0–40)
Albumin/Globulin Ratio: 2.3 — ABNORMAL HIGH (ref 1.2–2.2)
Albumin: 5.1 g/dL — ABNORMAL HIGH (ref 3.8–4.8)
Alkaline Phosphatase: 88 IU/L (ref 39–117)
BUN/Creatinine Ratio: 48 — ABNORMAL HIGH (ref 10–24)
BUN: 80 mg/dL (ref 8–27)
Bilirubin Total: 1.3 mg/dL — ABNORMAL HIGH (ref 0.0–1.2)
CO2: 26 mmol/L (ref 20–29)
Calcium: 9.7 mg/dL (ref 8.6–10.2)
Chloride: 88 mmol/L — ABNORMAL LOW (ref 96–106)
Creatinine, Ser: 1.68 mg/dL — ABNORMAL HIGH (ref 0.76–1.27)
GFR calc Af Amer: 49 mL/min/{1.73_m2} — ABNORMAL LOW (ref >59)
GFR calc non Af Amer: 42 mL/min/{1.73_m2} — ABNORMAL LOW (ref >59)
Globulin, Total: 2.2 g/dL (ref 1.5–4.5)
Glucose: 157 mg/dL — ABNORMAL HIGH (ref 65–99)
Potassium: 3.2 mmol/L — ABNORMAL LOW (ref 3.5–5.2)
Sodium: 135 mmol/L (ref 134–144)
Total Protein: 7.3 g/dL (ref 6.0–8.5)

## 2018-07-20 LAB — LIPID PANEL W/O CHOL/HDL RATIO
Cholesterol, Total: 125 mg/dL (ref 100–199)
HDL: 19 mg/dL — ABNORMAL LOW (ref >39)
Triglycerides: 708 mg/dL (ref 0–149)

## 2018-07-20 LAB — HEMOGLOBIN A1C
Est. average glucose Bld gHb Est-mCnc: 131 mg/dL
Hgb A1c MFr Bld: 6.2 % — ABNORMAL HIGH (ref 4.8–5.6)

## 2018-07-20 LAB — PRO B NATRIURETIC PEPTIDE: NT-Pro BNP: 673 pg/mL — ABNORMAL HIGH (ref 0–210)

## 2018-07-20 MED ORDER — GEMFIBROZIL 600 MG PO TABS: 300.0000 mg | ORAL_TABLET | Freq: Two times a day (BID) | ORAL | 2 refills | Status: DC

## 2018-07-20 NOTE — Telephone Encounter (Signed)
Telephone call to patient Informed of  Lab results and need to  Start Lopid 300 mg twice daily. Recheck lipids in 1 month. Patient verbalized understanding.

## 2018-07-20 NOTE — Telephone Encounter (Signed)
-----   Message from Richardo Priest, MD sent at 07/20/2018  8:16 AM EDT ----- Normal or stable result  His TG are very elevated  I advise lopid 300 bid and recheck 1 month

## 2018-07-20 NOTE — Addendum Note (Signed)
Addended by: Particia Nearing B on: 07/20/2018 11:32 AM   Modules accepted: Orders

## 2018-08-09 ENCOUNTER — Telehealth: Payer: Self-pay | Admitting: Cardiology

## 2018-08-09 NOTE — Telephone Encounter (Signed)
Patient is requesting a precription for a blood pressure monitor be faxed to his insurance [employer] Fax [phone]   [place] sent one to pharmacy but they would not process it so they discarded the prescription

## 2018-08-09 NOTE — Telephone Encounter (Signed)
Prescription for blood pressure cuff has been successfully faxed as requested.

## 2018-08-14 DIAGNOSIS — Z0000 Encounter for general adult medical examination without abnormal findings: Secondary | ICD-10-CM | POA: Diagnosis not present

## 2018-08-14 DIAGNOSIS — Z6829 Body mass index (BMI) 29.0-29.9, adult: Secondary | ICD-10-CM | POA: Diagnosis not present

## 2018-08-30 DIAGNOSIS — I110 Hypertensive heart disease with heart failure: Secondary | ICD-10-CM | POA: Diagnosis not present

## 2018-08-30 DIAGNOSIS — I5032 Chronic diastolic (congestive) heart failure: Secondary | ICD-10-CM | POA: Diagnosis not present

## 2018-08-30 LAB — LIPID PANEL
Chol/HDL Ratio: 3.6 ratio (ref 0.0–5.0)
Cholesterol, Total: 87 mg/dL — ABNORMAL LOW (ref 100–199)
HDL: 24 mg/dL — ABNORMAL LOW (ref >39)
LDL Calculated: 25 mg/dL (ref 0–99)
Triglycerides: 192 mg/dL — ABNORMAL HIGH (ref 0–149)
VLDL Cholesterol Cal: 38 mg/dL (ref 5–40)

## 2018-09-03 DIAGNOSIS — Z298 Encounter for other specified prophylactic measures: Secondary | ICD-10-CM | POA: Diagnosis not present

## 2018-09-03 DIAGNOSIS — Z944 Liver transplant status: Secondary | ICD-10-CM | POA: Diagnosis not present

## 2018-09-03 DIAGNOSIS — R601 Generalized edema: Secondary | ICD-10-CM | POA: Diagnosis not present

## 2018-09-20 DIAGNOSIS — E109 Type 1 diabetes mellitus without complications: Secondary | ICD-10-CM | POA: Diagnosis not present

## 2018-09-20 DIAGNOSIS — Z794 Long term (current) use of insulin: Secondary | ICD-10-CM | POA: Diagnosis not present

## 2018-09-20 DIAGNOSIS — E1165 Type 2 diabetes mellitus with hyperglycemia: Secondary | ICD-10-CM | POA: Diagnosis not present

## 2018-09-24 DIAGNOSIS — E109 Type 1 diabetes mellitus without complications: Secondary | ICD-10-CM | POA: Diagnosis not present

## 2018-09-24 DIAGNOSIS — Z794 Long term (current) use of insulin: Secondary | ICD-10-CM | POA: Diagnosis not present

## 2018-09-24 DIAGNOSIS — E1165 Type 2 diabetes mellitus with hyperglycemia: Secondary | ICD-10-CM | POA: Diagnosis not present

## 2018-10-08 DIAGNOSIS — H4312 Vitreous hemorrhage, left eye: Secondary | ICD-10-CM | POA: Diagnosis not present

## 2018-10-09 DIAGNOSIS — E113291 Type 2 diabetes mellitus with mild nonproliferative diabetic retinopathy without macular edema, right eye: Secondary | ICD-10-CM | POA: Diagnosis not present

## 2018-10-09 DIAGNOSIS — H4312 Vitreous hemorrhage, left eye: Secondary | ICD-10-CM | POA: Diagnosis not present

## 2018-10-09 DIAGNOSIS — H33312 Horseshoe tear of retina without detachment, left eye: Secondary | ICD-10-CM | POA: Diagnosis not present

## 2018-10-09 DIAGNOSIS — H43811 Vitreous degeneration, right eye: Secondary | ICD-10-CM | POA: Diagnosis not present

## 2018-10-11 DIAGNOSIS — H4312 Vitreous hemorrhage, left eye: Secondary | ICD-10-CM | POA: Diagnosis not present

## 2018-10-11 DIAGNOSIS — H33312 Horseshoe tear of retina without detachment, left eye: Secondary | ICD-10-CM | POA: Diagnosis not present

## 2018-10-12 DIAGNOSIS — H33312 Horseshoe tear of retina without detachment, left eye: Secondary | ICD-10-CM | POA: Diagnosis not present

## 2018-10-15 ENCOUNTER — Ambulatory Visit: Payer: Medicare Other | Admitting: Cardiology

## 2018-10-18 ENCOUNTER — Other Ambulatory Visit: Payer: Self-pay | Admitting: Cardiology

## 2018-10-19 ENCOUNTER — Telehealth: Payer: Self-pay | Admitting: Cardiology

## 2018-10-19 NOTE — Telephone Encounter (Signed)
Was already sent to the pharmacy

## 2018-10-19 NOTE — Telephone Encounter (Signed)
°  Patient unable to come to [facility], has appt set up in [place].   1. Which medications need to be refilled? (please list name of each medication and dose if known) bumetanide 2mg  tablets  2. Which pharmacy/location (including street and city if local pharmacy) is medication to be sent to?[facility]  3. Do they need a 30 day or 90 day supply? [place]

## 2018-10-24 ENCOUNTER — Ambulatory Visit: Payer: Medicare Other | Admitting: Cardiology

## 2018-10-24 DIAGNOSIS — Z0000 Encounter for general adult medical examination without abnormal findings: Secondary | ICD-10-CM | POA: Diagnosis not present

## 2018-11-05 DIAGNOSIS — Z298 Encounter for other specified prophylactic measures: Secondary | ICD-10-CM | POA: Diagnosis not present

## 2018-11-05 DIAGNOSIS — Z944 Liver transplant status: Secondary | ICD-10-CM | POA: Diagnosis not present

## 2018-11-07 DIAGNOSIS — H35372 Puckering of macula, left eye: Secondary | ICD-10-CM | POA: Diagnosis not present

## 2018-11-14 ENCOUNTER — Other Ambulatory Visit: Payer: Self-pay | Admitting: Cardiology

## 2018-11-26 DIAGNOSIS — Z944 Liver transplant status: Secondary | ICD-10-CM | POA: Diagnosis not present

## 2018-12-12 DIAGNOSIS — E1165 Type 2 diabetes mellitus with hyperglycemia: Secondary | ICD-10-CM | POA: Diagnosis not present

## 2018-12-12 DIAGNOSIS — E109 Type 1 diabetes mellitus without complications: Secondary | ICD-10-CM | POA: Diagnosis not present

## 2018-12-12 DIAGNOSIS — Z794 Long term (current) use of insulin: Secondary | ICD-10-CM | POA: Diagnosis not present

## 2018-12-18 NOTE — Progress Notes (Signed)
Cardiology Office Note:    Date:  12/19/2018   ID:  Gregory Tanner, DOB 03-25-1955, MRN [id number]  PCP:  Ronita Hipps, MD  Cardiologist:  Shirlee More, MD    Referring MD: Ronita Hipps, MD    ASSESSMENT:    1. Chronic diastolic heart failure ([place])   2. Hypertensive heart disease with heart failure (HCC)   3. Stage 3a chronic kidney disease    PLAN:    In order of problems listed above:  1. Compensated continue his current diuretic recent labs reviewed from [place] I will plan to see in the office in 6 months.  At this time I do not think he requires a repeat echocardiogram 2. Stable BP at target he complains of loss of taste and smell chronic not acute likely related to ARB will discontinue monitor blood pressure and if systolics are read in 939 start low-dose hydralazine.  Asked him to let me know a few weeks if he is improved.  He will continue his current medical regimen which includes his loop diuretic and intermittent Zaroxolyn 3. Stable recent labs are actually improved.   Next appointment: 6 months   Medication Adjustments/Labs and Tests Ordered: Current medicines are reviewed at length with the patient today.  Concerns regarding medicines are outlined above.  No orders of the defined types were placed in this encounter.  No orders of the defined types were placed in this encounter.   Chief Complaint  Patient presents with   Follow-up   Congestive Heart Failure    History of Present Illness:     Gregory Tanner is a 63 y.o. male with a hx of a liver transplantation, hypertension with CKD and heart failure   last seen 07/18/2018 virtual video visit. Compliance with diet, lifestyle and medications: Yes  He still has peripheral edema his diuretic doses are fixed and he takes once weekly Zaroxolyn and his weights wax and wane above and below 210 pounds.  He is not short of breath no orthopnea chest pain or syncope.  Recent labs show stable renal function.   He complains of a chronic loss of taste and smell likely related to ARB although it is much less common than ACE inhibitor.  At his request to discontinue and if needed transition to hydralazine Past Medical History:  Diagnosis Date   Ankylosis of lumbar spine 02/21/2014   Benign essential hypertension 10/26/2015   BP (high blood pressure) 12/27/2010   Chronic pain associated with significant psychosocial dysfunction 06/17/2015   Chronic pain disorder 10/28/2015   Degeneration of intervertebral disc of lumbar region 07/05/2013   Degenerative disc disease, lumbar 07/26/2012   Diabetes mellitus without complication ([place])    Encounter for other specified prophylactic measures 10/10/2011   Excess weight 10/10/2011   Hearing loss    Hepatocellular carcinoma ([place]) 12/27/2010   Overview:  Embolized 6/12    History of liver transplant ([place]) 07/20/2011   Overview:  06/07/2011 for cryptogenic cirrhosis and HCC    Hypertension    Liver disease    Lumbar radiculopathy 10/10/2012   Nerve root pain 06/17/2015   Overview:  Right    SOB (shortness of breath) on exertion 10/28/2015   Swelling    Type 2 diabetes mellitus ([place]) 12/27/2010   Overview:  H1C 6.5 12/2010     Past Surgical History:  Procedure Laterality Date   BACK SURGERY     BAKER CYST SURGERY     RIGHT LEG   EYE SURGERY  Left    HERNIA REPAIR     LIVER SURGERY     IMPLANT   LIVER TRANSPLANT     TONSILL SURGERY      Current Medications: Current Meds  Medication Sig   allopurinol (ZYLOPRIM) 300 MG tablet TAKE 1 TABLET BY MOUTH ONCE (1) DAILY   aspirin 81 MG tablet Take 81 mg by mouth daily.   bumetanide (BUMEX) 2 MG tablet Take 1.5 tablets by mouth 2 (two) times daily.   COLCRYS 0.6 MG tablet TAKE 1 TABLET BY MOUTH ONCE (1) DAILY FOR GOUT. TAKE no more THAN 5 DAYS straight IN an acute flair.   gemfibrozil (LOPID) 600 MG tablet TAKE 1/2 TABLET BY MOUTH TWICE (2) DAILY BEFORE A MEAL   insulin regular human  CONCENTRATED (HUMULIN R U-500 KWIKPEN) 500 UNIT/ML kwikpen Inject into the skin. 115 in the morning, 120 at night, 40-50 units with snack   losartan (COZAAR) 25 MG tablet TAKE 1 TABLET BY MOUTH ONCE (1) DAILY   metolazone (ZAROXOLYN) 5 MG tablet TAKE 1 TABLET BY MOUTH ONCE a WEEK. TAKE 45 MINUTES prior TO bumex dose   Multiple Vitamin (MULTIVITAMIN) tablet Take 1 tablet by mouth daily.   mycophenolate (CELLCEPT) 250 MG capsule TAKE 3 CAPSULES BY MOUTH TWICE (2) DAILY   tacrolimus (PROGRAF) 0.5 MG capsule Take 0.5 capsules by mouth as directed. Takes 1 capsule in the am, and 1 capsule at night every other night     Allergies:   Iodinated diagnostic agents, Latex, Metformin and related, and Rosiglitazone   Social History   Socioeconomic History   Marital status: Married    Spouse name: Not on file   Number of children: Not on file   Years of education: Not on file   Highest education level: Not on file  Occupational History   Not on file  Social Needs   Financial resource strain: Not on file   Food insecurity    Worry: Not on file    Inability: Not on file   Transportation needs    Medical: Not on file    Non-medical: Not on file  Tobacco Use   Smoking status: Never Smoker   Smokeless tobacco: Never Used  Substance and Sexual Activity   Alcohol use: No    Alcohol/week: 0.0 standard drinks   Drug use: No   Sexual activity: Not on file  Lifestyle   Physical activity    Days per week: Not on file    Minutes per session: Not on file   Stress: Not on file  Relationships   Social connections    Talks on phone: Not on file    Gets together: Not on file    Attends religious service: Not on file    Active member of club or organization: Not on file    Attends meetings of clubs or organizations: Not on file    Relationship status: Not on file  Other Topics Concern   Not on file  Social History Narrative   Not on file     Family History: The patient's  family history includes Cancer in his mother; Diabetes in his father; Heart attack in his father; Hypertension in his brother. ROS:   Please see the history of present illness.    All other systems reviewed and are negative.  EKGs/Labs/Other Studies Reviewed:    The following studies were reviewed today:  EKG:  EKG ordered today and personally reviewed.  The ekg ordered today demonstrates sinus rhythm right  bundle branch block  Recent Labs:  11/26/2018, hemoglobin 14.5 platelets chronically low 115,000 11/26/2018 bilirubin 1.4 creatinine 1.4 BUN 53 potassium 3.1 07/19/2018: ALT 23; BUN 80; Creatinine, Ser 1.68; Hemoglobin 16.0; NT-Pro BNP 673; Platelets 125; Potassium 3.2; Sodium 135  Recent Lipid Panel    Component Value Date/Time   CHOL 87 (L) 08/30/2018 0842   TRIG 192 (H) 08/30/2018 0842   HDL 24 (L) 08/30/2018 0842   CHOLHDL 3.6 08/30/2018 0842   LDLCALC 25 08/30/2018 0842    Physical Exam:    VS:  BP (!) 124/58 (BP Location: Right Arm, Patient Position: Sitting, Cuff Size: Normal)    Pulse 98    Ht 5\' 10"  (1.778 m)    Wt 216 lb (98 kg)    SpO2 95%    BMI 30.99 kg/m     Wt Readings from Last 3 Encounters:  12/19/18 216 lb (98 kg)  07/18/18 215 lb (97.5 kg)  10/18/17 224 lb 9.6 oz (101.9 kg)     GEN:  Well nourished, well developed in no acute distress HEENT: Normal NECK: No JVD; No carotid bruits LYMPHATICS: No lymphadenopathy CARDIAC: RRR, no murmurs, rubs, gallops RESPIRATORY:  Clear to auscultation without rales, wheezing or rhonchi  ABDOMEN: Soft, non-tender, non-distended MUSCULOSKELETAL: 2+ lower extremity to the knee laterally edema; No deformity  SKIN: Warm and dry NEUROLOGIC:  Alert and oriented x 3 PSYCHIATRIC:  Normal affect    Signed, Shirlee More, MD  12/19/2018 2:47 PM    [place]

## 2018-12-19 ENCOUNTER — Other Ambulatory Visit: Payer: Self-pay

## 2018-12-19 ENCOUNTER — Encounter: Payer: Self-pay | Admitting: Cardiology

## 2018-12-19 ENCOUNTER — Ambulatory Visit: Payer: Medicare Other | Admitting: Cardiology

## 2018-12-19 VITALS — BP 124/58 | HR 98 | Ht 70.0 in | Wt 216.0 lb

## 2018-12-19 DIAGNOSIS — I5032 Chronic diastolic (congestive) heart failure: Secondary | ICD-10-CM | POA: Diagnosis not present

## 2018-12-19 DIAGNOSIS — I110 Hypertensive heart disease with heart failure: Secondary | ICD-10-CM

## 2018-12-19 DIAGNOSIS — N1831 Chronic kidney disease, stage 3a: Secondary | ICD-10-CM | POA: Diagnosis not present

## 2018-12-19 MED ORDER — HYDRALAZINE HCL 25 MG PO TABS: ORAL_TABLET | ORAL | 5 refills | Status: DC

## 2018-12-19 NOTE — Addendum Note (Signed)
Addended by: Austin Miles on: 12/19/2018 02:57 PM   Modules accepted: Orders

## 2018-12-19 NOTE — Patient Instructions (Signed)
Medication Instructions:  Your physician has recommended you make the following change in your medication:   STOP losartan  START hydralazine (apresoline) 25 mg: Take 0.5 tablet (12.5 mg) twice daily if your systolic blood pressure (top number) is greater than 140.   *If you need a refill on your cardiac medications before your next appointment, please call your pharmacy*  Lab Work: None  If you have labs (blood work) drawn today and your tests are completely normal, you will receive your results only by: Marland Kitchen MyChart Message (if you have MyChart) OR . A paper copy in the mail If you have any lab test that is abnormal or we need to change your treatment, we will call you to review the results.  Testing/Procedures: You had an EKG today.   Follow-Up: At [facility], you and your health needs are our priority.  As part of our continuing mission to provide you with exceptional heart care, we have created designated Provider Care Teams.  These Care Teams include your primary Cardiologist (physician) and Advanced Practice Providers (APPs -  Physician Assistants and Nurse Practitioners) who all work together to provide you with the care you need, when you need it.  Your next appointment:   6 months  The format for your next appointment:   In Person  Provider:   Shirlee More, MD    Hydralazine tablets What is this medicine? HYDRALAZINE (hye DRAL a zeen) is a type of vasodilator. It relaxes blood vessels, increasing the blood and oxygen supply to your heart. This medicine is used to treat high blood pressure. This medicine may be used for other purposes; ask your health care provider or pharmacist if you have questions. COMMON BRAND NAME(S): Apresoline What should I tell my health care provider before I take this medicine? They need to know if you have any of these conditions:  blood vessel disease  heart disease including angina or history of heart attack  kidney or liver  disease  systemic lupus erythematosus (SLE)  an unusual or allergic reaction to hydralazine, tartrazine dye, other medicines, foods, dyes, or preservatives  pregnant or trying to get pregnant  breast-feeding How should I use this medicine? Take this medicine by mouth with a glass of water. Follow the directions on the prescription label. Take your doses at regular intervals. Do not take your medicine more often than directed. Do not stop taking except on the advice of your doctor or health care professional. Talk to your pediatrician regarding the use of this medicine in children. Special care may be needed. While this drug may be prescribed for children for selected conditions, precautions do apply. Overdosage: If you think you have taken too much of this medicine contact a poison control center or emergency room at once. NOTE: This medicine is only for you. Do not share this medicine with others. What if I miss a dose? If you miss a dose, take it as soon as you can. If it is almost time for your next dose, take only that dose. Do not take double or extra doses. What may interact with this medicine?  medicines for high blood pressure  medicines for mental depression This list may not describe all possible interactions. Give your health care provider a list of all the medicines, herbs, non-prescription drugs, or dietary supplements you use. Also tell them if you smoke, drink alcohol, or use illegal drugs. Some items may interact with your medicine. What should I watch for while using this medicine?  Visit your doctor or health care professional for regular checks on your progress. Check your blood pressure and pulse rate regularly. Ask your doctor or health care professional what your blood pressure and pulse rate should be and when you should contact him or her. You may get drowsy or dizzy. Do not drive, use machinery, or do anything that needs mental alertness until you know how this  medicine affects you. Do not stand or sit up quickly, especially if you are an older patient. This reduces the risk of dizzy or fainting spells. Alcohol may interfere with the effect of this medicine. Avoid alcoholic drinks. Do not treat yourself for coughs, colds, or pain while you are taking this medicine without asking your doctor or health care professional for advice. Some ingredients may increase your blood pressure. What side effects may I notice from receiving this medicine? Side effects that you should report to your doctor or health care professional as soon as possible:  chest pain, or fast or irregular heartbeat  fever, chills, or sore throat  numbness or tingling in the hands or feet  shortness of breath  skin rash, redness, blisters or itching  stiff or swollen joints  sudden weight gain  swelling of the feet or legs  swollen lymph glands  unusual weakness Side effects that usually do not require medical attention (report to your doctor or health care professional if they continue or are bothersome):  diarrhea, or constipation  headache  loss of appetite  nausea, vomiting This list may not describe all possible side effects. Call your doctor for medical advice about side effects. You may report side effects to FDA at 1-800-FDA-1088. Where should I keep my medicine? Keep out of the reach of children. Store at room temperature between 15 and 30 degrees C (59 and 86 degrees F). Throw away any unused medicine after the expiration date. NOTE: This sheet is a summary. It may not cover all possible information. If you have questions about this medicine, talk to your doctor, pharmacist, or health care provider.  2020 Elsevier/Gold Standard (2007-06-15 15:44:58)

## 2018-12-25 DIAGNOSIS — Z79899 Other long term (current) drug therapy: Secondary | ICD-10-CM | POA: Diagnosis not present

## 2018-12-25 DIAGNOSIS — Z298 Encounter for other specified prophylactic measures: Secondary | ICD-10-CM | POA: Diagnosis not present

## 2019-01-01 DIAGNOSIS — E1165 Type 2 diabetes mellitus with hyperglycemia: Secondary | ICD-10-CM | POA: Diagnosis not present

## 2019-01-01 DIAGNOSIS — Z794 Long term (current) use of insulin: Secondary | ICD-10-CM | POA: Diagnosis not present

## 2019-01-01 DIAGNOSIS — E119 Type 2 diabetes mellitus without complications: Secondary | ICD-10-CM | POA: Diagnosis not present

## 2019-01-03 DIAGNOSIS — Z794 Long term (current) use of insulin: Secondary | ICD-10-CM | POA: Diagnosis not present

## 2019-01-03 DIAGNOSIS — E109 Type 1 diabetes mellitus without complications: Secondary | ICD-10-CM | POA: Diagnosis not present

## 2019-01-03 DIAGNOSIS — E1165 Type 2 diabetes mellitus with hyperglycemia: Secondary | ICD-10-CM | POA: Diagnosis not present

## 2019-01-08 DIAGNOSIS — L821 Other seborrheic keratosis: Secondary | ICD-10-CM | POA: Diagnosis not present

## 2019-01-08 DIAGNOSIS — L570 Actinic keratosis: Secondary | ICD-10-CM | POA: Diagnosis not present

## 2019-01-08 DIAGNOSIS — D485 Neoplasm of uncertain behavior of skin: Secondary | ICD-10-CM | POA: Diagnosis not present

## 2019-01-08 DIAGNOSIS — D1801 Hemangioma of skin and subcutaneous tissue: Secondary | ICD-10-CM | POA: Diagnosis not present

## 2019-01-09 ENCOUNTER — Other Ambulatory Visit: Payer: Self-pay | Admitting: Cardiology

## 2019-01-09 NOTE — Telephone Encounter (Signed)
Yes

## 2019-01-14 ENCOUNTER — Telehealth: Payer: Self-pay | Admitting: Cardiology

## 2019-01-14 NOTE — Telephone Encounter (Signed)
Has questions about BP medication

## 2019-01-15 NOTE — Telephone Encounter (Signed)
Patient called to see if any further medication changes need to be made to his regimen. He reports checking his BP twice daily before taking hydralazine 25 mg. Patient states he has taken this medication twice daily as directed due to his systolic BP being greater than 140 (typically systolic BP is in the 308'F) except for a couple of days since his office visit on 12/19/2018. Patient has no complaints at this time. Please advise if any further medication changes need to be made. Thanks!

## 2019-01-15 NOTE — Telephone Encounter (Signed)
Take hydralazine 25 tid

## 2019-01-16 MED ORDER — HYDRALAZINE HCL 25 MG PO TABS: ORAL_TABLET | ORAL | 4 refills | Status: DC

## 2019-01-16 NOTE — Addendum Note (Signed)
Addended by: Austin Miles on: 01/16/2019 12:19 PM   Modules accepted: Orders

## 2019-01-16 NOTE — Telephone Encounter (Signed)
Patient informed to increase hydralazine from twice daily to three times daily as needed if his systolic BP is greater than 140. Patient is agreeable and will continue to monitor his BP. He will contact our office with any further questions or concerns. Refill for hydralazine sent to [facility] in [place] as requested. No further questions.

## 2019-02-04 DIAGNOSIS — Z9181 History of falling: Secondary | ICD-10-CM | POA: Diagnosis not present

## 2019-02-04 DIAGNOSIS — Z6830 Body mass index (BMI) 30.0-30.9, adult: Secondary | ICD-10-CM | POA: Diagnosis not present

## 2019-02-04 DIAGNOSIS — Z1211 Encounter for screening for malignant neoplasm of colon: Secondary | ICD-10-CM | POA: Diagnosis not present

## 2019-02-04 DIAGNOSIS — Z1331 Encounter for screening for depression: Secondary | ICD-10-CM | POA: Diagnosis not present

## 2019-02-27 ENCOUNTER — Other Ambulatory Visit: Payer: Self-pay | Admitting: Cardiology

## 2019-03-06 DIAGNOSIS — H4312 Vitreous hemorrhage, left eye: Secondary | ICD-10-CM | POA: Diagnosis not present

## 2019-03-06 DIAGNOSIS — H2513 Age-related nuclear cataract, bilateral: Secondary | ICD-10-CM | POA: Diagnosis not present

## 2019-03-11 DIAGNOSIS — I509 Heart failure, unspecified: Secondary | ICD-10-CM | POA: Diagnosis not present

## 2019-03-11 DIAGNOSIS — J309 Allergic rhinitis, unspecified: Secondary | ICD-10-CM | POA: Diagnosis not present

## 2019-03-12 DIAGNOSIS — H2513 Age-related nuclear cataract, bilateral: Secondary | ICD-10-CM | POA: Diagnosis not present

## 2019-03-12 DIAGNOSIS — E119 Type 2 diabetes mellitus without complications: Secondary | ICD-10-CM | POA: Diagnosis not present

## 2019-03-12 DIAGNOSIS — H25043 Posterior subcapsular polar age-related cataract, bilateral: Secondary | ICD-10-CM | POA: Diagnosis not present

## 2019-03-12 DIAGNOSIS — H18413 Arcus senilis, bilateral: Secondary | ICD-10-CM | POA: Diagnosis not present

## 2019-03-15 DIAGNOSIS — H2512 Age-related nuclear cataract, left eye: Secondary | ICD-10-CM | POA: Diagnosis not present

## 2019-03-15 DIAGNOSIS — Z961 Presence of intraocular lens: Secondary | ICD-10-CM | POA: Diagnosis not present

## 2019-03-19 DIAGNOSIS — E109 Type 1 diabetes mellitus without complications: Secondary | ICD-10-CM | POA: Diagnosis not present

## 2019-03-19 DIAGNOSIS — Z794 Long term (current) use of insulin: Secondary | ICD-10-CM | POA: Diagnosis not present

## 2019-03-20 DIAGNOSIS — Z794 Long term (current) use of insulin: Secondary | ICD-10-CM | POA: Diagnosis not present

## 2019-03-20 DIAGNOSIS — E109 Type 1 diabetes mellitus without complications: Secondary | ICD-10-CM | POA: Diagnosis not present

## 2019-03-26 DIAGNOSIS — Z944 Liver transplant status: Secondary | ICD-10-CM | POA: Diagnosis not present

## 2019-04-04 DIAGNOSIS — J329 Chronic sinusitis, unspecified: Secondary | ICD-10-CM | POA: Diagnosis not present

## 2019-04-04 DIAGNOSIS — J40 Bronchitis, not specified as acute or chronic: Secondary | ICD-10-CM | POA: Diagnosis not present

## 2019-04-09 DIAGNOSIS — E876 Hypokalemia: Secondary | ICD-10-CM | POA: Diagnosis not present

## 2019-04-09 DIAGNOSIS — E119 Type 2 diabetes mellitus without complications: Secondary | ICD-10-CM | POA: Diagnosis not present

## 2019-04-11 DIAGNOSIS — R69 Illness, unspecified: Secondary | ICD-10-CM | POA: Diagnosis not present

## 2019-04-23 DIAGNOSIS — Z944 Liver transplant status: Secondary | ICD-10-CM | POA: Diagnosis not present

## 2019-05-07 DIAGNOSIS — J329 Chronic sinusitis, unspecified: Secondary | ICD-10-CM | POA: Diagnosis not present

## 2019-05-07 DIAGNOSIS — J40 Bronchitis, not specified as acute or chronic: Secondary | ICD-10-CM | POA: Diagnosis not present

## 2019-05-08 ENCOUNTER — Other Ambulatory Visit: Payer: Self-pay | Admitting: Cardiology

## 2019-05-22 ENCOUNTER — Other Ambulatory Visit: Payer: Self-pay | Admitting: Cardiology

## 2019-06-06 DIAGNOSIS — I10 Essential (primary) hypertension: Secondary | ICD-10-CM | POA: Diagnosis not present

## 2019-06-14 DIAGNOSIS — I10 Essential (primary) hypertension: Secondary | ICD-10-CM | POA: Diagnosis not present

## 2019-06-17 ENCOUNTER — Other Ambulatory Visit: Payer: Self-pay

## 2019-06-17 ENCOUNTER — Encounter: Payer: Self-pay | Admitting: Cardiology

## 2019-06-17 ENCOUNTER — Ambulatory Visit: Payer: Medicare Other | Admitting: Cardiology

## 2019-06-17 VITALS — BP 152/62 | HR 102 | Temp 97.5°F | Ht 70.0 in | Wt 219.8 lb

## 2019-06-17 DIAGNOSIS — I110 Hypertensive heart disease with heart failure: Secondary | ICD-10-CM

## 2019-06-17 DIAGNOSIS — I5032 Chronic diastolic (congestive) heart failure: Secondary | ICD-10-CM | POA: Diagnosis not present

## 2019-06-17 DIAGNOSIS — Z944 Liver transplant status: Secondary | ICD-10-CM

## 2019-06-17 DIAGNOSIS — E876 Hypokalemia: Secondary | ICD-10-CM

## 2019-06-17 DIAGNOSIS — N1831 Chronic kidney disease, stage 3a: Secondary | ICD-10-CM | POA: Diagnosis not present

## 2019-06-17 MED ORDER — BUMETANIDE 2 MG PO TABS: 2.0000 mg | ORAL_TABLET | Freq: Two times a day (BID) | ORAL | 3 refills | Status: DC

## 2019-06-17 NOTE — Progress Notes (Signed)
Cardiology Office Note:    Date:  06/17/2019   ID:  MATTIS FEATHERLY, DOB 1956-02-07, MRN [id number]  PCP:  Ronita Hipps, MD  Cardiologist:  Shirlee More, MD    Referring MD: Ronita Hipps, MD    ASSESSMENT:    1. Chronic diastolic heart failure ([place])   2. Hypertensive heart disease with heart failure (HCC)   3. Stage 3a chronic kidney disease   4. History of liver transplant ([place])   5. Hypokalemia    PLAN:    In order of problems listed above:  1. I think his edema is multifactorial liver disease CKD and diastolic heart failure.  He will continue Bumex I asked him to only take metolazone once a week given CKD I would not put him on a distal diuretic.  Recheck echocardiogram. 2. See above 3. Repeat labs this Friday with magnesium level with severe hypokalemia suspect he is severely depleted and that is part of his hypokalemia 4. Followed at [facility]   Next appointment: 3 months   Medication Adjustments/Labs and Tests Ordered: Current medicines are reviewed at length with the patient today.  Concerns regarding medicines are outlined above.  No orders of the defined types were placed in this encounter.  No orders of the defined types were placed in this encounter.   Chief Complaint  Patient presents with  . Follow-up  . Congestive Heart Failure    History of Present Illness:    SANDRO BURGO is a 64 y.o. male with a hx of liver transplantation, hypertension with CKD and heart failure  last seen 12/19/2018. Compliance with diet, lifestyle and medications: Yes  Is taking higher doses of Bumex because developed severe hypokalemia repeat on Friday was 3.3 normal is chronic.  In the past he had a low magnesium and of asked him to have it drawn at his PCP office he may require magnesium supplements.  He has edema but no shortness of breath chest pain palpitation or syncope.  Labs performed at [facility] 06/06/2019: Mild elevation of bilirubin 1.5 creatinine 1.7 potassium 2.7.   Hemoglobin 14.7 Past Medical History:  Diagnosis Date  . Ankylosis of lumbar spine 02/21/2014  . Benign essential hypertension 10/26/2015  . BP (high blood pressure) 12/27/2010  . Chronic pain associated with significant psychosocial dysfunction 06/17/2015  . Chronic pain disorder 10/28/2015  . Degeneration of intervertebral disc of lumbar region 07/05/2013  . Degenerative disc disease, lumbar 07/26/2012  . Diabetes mellitus without complication ([place])   . Encounter for other specified prophylactic measures 10/10/2011  . Excess weight 10/10/2011  . Hearing loss   . Hepatocellular carcinoma ([place]) 12/27/2010   Overview:  Embolized 6/12   . History of liver transplant ([place]) 07/20/2011   Overview:  06/07/2011 for cryptogenic cirrhosis and HCC   . Hypertension   . Liver disease   . Lumbar radiculopathy 10/10/2012  . Nerve root pain 06/17/2015   Overview:  Right   . SOB (shortness of breath) on exertion 10/28/2015  . Swelling   . Type 2 diabetes mellitus ([place]) 12/27/2010   Overview:  H1C 6.5 12/2010     Past Surgical History:  Procedure Laterality Date  . BACK SURGERY    . BAKER CYST SURGERY     RIGHT LEG  . EYE SURGERY Left   . HERNIA REPAIR    . LIVER SURGERY     IMPLANT  . LIVER TRANSPLANT    . TONSILL SURGERY      Current Medications: Current  Meds  Medication Sig  . allopurinol (ZYLOPRIM) 300 MG tablet TAKE 1 TABLET BY MOUTH ONCE (1) DAILY  . aspirin 81 MG tablet Take 81 mg by mouth daily.  . bumetanide (BUMEX) 2 MG tablet TAKE 1 TABLET BY MOUTH TWICE (2) DAILY. TAKE an extra 1/2 TABLET IN THE MORNING.  . cefdinir (OMNICEF) 300 MG capsule Take 300 mg by mouth 2 (two) times daily.  Marland Kitchen gemfibrozil (LOPID) 600 MG tablet TAKE 1/2 TABLET BY MOUTH TWICE (2) DAILY BEFORE A MEAL  . insulin regular (NOVOLIN R) 100 units/mL injection Inject 115 Units into the skin daily with breakfast. Then 20-40 with snacks, and 120 units with supper  . metolazone (ZAROXOLYN) 5 MG tablet TAKE 1 TABLET BY MOUTH  ONCE a WEEK. TAKE 45 MINUTES prior TO bumex dose  . Multiple Vitamin (MULTIVITAMIN) tablet Take 1 tablet by mouth daily.  . potassium chloride SA (KLOR-CON) 20 MEQ tablet Take 20 mEq by mouth 2 (two) times daily.  . tacrolimus (PROGRAF) 0.5 MG capsule Take 0.5 capsules by mouth as directed. Takes 1 capsule in the am, and 1 capsule at night every other night     Allergies:   Iodinated diagnostic agents, Latex, Metformin and related, and Rosiglitazone   Social History   Socioeconomic History  . Marital status: Married    Spouse name: Not on file  . Number of children: Not on file  . Years of education: Not on file  . Highest education level: Not on file  Occupational History  . Not on file  Tobacco Use  . Smoking status: Never Smoker  . Smokeless tobacco: Never Used  Substance and Sexual Activity  . Alcohol use: No    Alcohol/week: 0.0 standard drinks  . Drug use: No  . Sexual activity: Not on file  Other Topics Concern  . Not on file  Social History Narrative  . Not on file   Social Determinants of Health   Financial Resource Strain:   . Difficulty of Paying Living Expenses:   Food Insecurity:   . Worried About Charity fundraiser in the Last Year:   . Arboriculturist in the Last Year:   Transportation Needs:   . Film/video editor (Medical):   Marland Kitchen Lack of Transportation (Non-Medical):   Physical Activity:   . Days of Exercise per Week:   . Minutes of Exercise per Session:   Stress:   . Feeling of Stress :   Social Connections:   . Frequency of Communication with Friends and Family:   . Frequency of Social Gatherings with Friends and Family:   . Attends Religious Services:   . Active Member of Clubs or Organizations:   . Attends Archivist Meetings:   Marland Kitchen Marital Status:      Family History: The patient's family history includes Cancer in his mother; Diabetes in his father; Heart attack in his father; Hypertension in his brother. ROS:   Please see  the history of present illness.    All other systems reviewed and are negative.  EKGs/Labs/Other Studies Reviewed:    The following studies were reviewed today:  EKG:  EKG ordered today and personally reviewed.  The ekg ordered today demonstrates sinus rhythm left atrial enlargement right bundle branch block  Recent Labs: 07/19/2018: ALT 23; BUN 80; Creatinine, Ser 1.68; Hemoglobin 16.0; NT-Pro BNP 673; Platelets 125; Potassium 3.2; Sodium 135  Recent Lipid Panel    Component Value Date/Time   CHOL 87 (L) 08/30/2018  0842   TRIG 192 (H) 08/30/2018 0842   HDL 24 (L) 08/30/2018 0842   CHOLHDL 3.6 08/30/2018 0842   LDLCALC 25 08/30/2018 0842    Physical Exam:    VS:  BP (!) 152/62   Pulse (!) 102   Temp (!) 97.5 F (36.4 C)   Ht 5\' 10"  (1.778 m)   Wt 219 lb 12.8 oz (99.7 kg)   SpO2 96%   BMI 31.54 kg/m     Wt Readings from Last 3 Encounters:  06/17/19 219 lb 12.8 oz (99.7 kg)  12/19/18 216 lb (98 kg)  07/18/18 215 lb (97.5 kg)     GEN:  Well nourished, well developed in no acute distress HEENT: Normal NECK: No JVD; No carotid bruits LYMPHATICS: No lymphadenopathy CARDIAC: RRR, no murmurs, rubs, gallops RESPIRATORY:  Clear to auscultation without rales, wheezing or rhonchi  ABDOMEN: Soft, non-tender, non-distended MUSCULOSKELETAL: 1-2+ bilateral lower extremity pitting edema; No deformity  SKIN: Warm and dry NEUROLOGIC:  Alert and oriented x 3 PSYCHIATRIC:  Normal affect    Signed, Shirlee More, MD  06/17/2019 10:14 AM    [place]

## 2019-06-17 NOTE — Patient Instructions (Signed)
Medication Instructions:  Your physician recommends that you continue on your current medications as directed. Please refer to the Current Medication list given to you today.  *If you need a refill on your cardiac medications before your next appointment, please call your pharmacy*   Lab Work: None If you have labs (blood work) drawn today and your tests are completely normal, you will receive your results only by: Marland Kitchen MyChart Message (if you have MyChart) OR . A paper copy in the mail If you have any lab test that is abnormal or we need to change your treatment, we will call you to review the results.   Testing/Procedures: Your physician has requested that you have an echocardiogram. Echocardiography is a painless test that uses sound waves to create images of your heart. It provides your doctor with information about the size and shape of your heart and how well your heart's chambers and valves are working. This procedure takes approximately one hour. There are no restrictions for this procedure.     Follow-Up: At [facility], you and your health needs are our priority.  As part of our continuing mission to provide you with exceptional heart care, we have created designated Provider Care Teams.  These Care Teams include your primary Cardiologist (physician) and Advanced Practice Providers (APPs -  Physician Assistants and Nurse Practitioners) who all work together to provide you with the care you need, when you need it.  We recommend signing up for the patient portal called "MyChart".  Sign up information is provided on this After Visit Summary.  MyChart is used to connect with patients for Virtual Visits (Telemedicine).  Patients are able to view lab/test results, encounter notes, upcoming appointments, etc.  Non-urgent messages can be sent to your provider as well.   To learn more about what you can do with MyChart, go to [email or web address].    Your next appointment:   3  month(s)  The format for your next appointment:   In Person  Provider:   Shirlee More, MD   Other Instructions

## 2019-06-21 DIAGNOSIS — I10 Essential (primary) hypertension: Secondary | ICD-10-CM | POA: Diagnosis not present

## 2019-06-28 DIAGNOSIS — E876 Hypokalemia: Secondary | ICD-10-CM | POA: Diagnosis not present

## 2019-07-03 ENCOUNTER — Other Ambulatory Visit: Payer: Self-pay

## 2019-07-03 ENCOUNTER — Ambulatory Visit (INDEPENDENT_AMBULATORY_CARE_PROVIDER_SITE_OTHER): Payer: Medicare Other

## 2019-07-03 DIAGNOSIS — I110 Hypertensive heart disease with heart failure: Secondary | ICD-10-CM

## 2019-07-03 DIAGNOSIS — I5032 Chronic diastolic (congestive) heart failure: Secondary | ICD-10-CM | POA: Diagnosis not present

## 2019-07-03 NOTE — Progress Notes (Signed)
Complete echocardiogram has been performed.  Jimmy Kareena Arrambide RDCS, RVT 

## 2019-07-05 DIAGNOSIS — E876 Hypokalemia: Secondary | ICD-10-CM | POA: Diagnosis not present

## 2019-07-22 ENCOUNTER — Other Ambulatory Visit: Payer: Self-pay | Admitting: Cardiology

## 2019-07-23 ENCOUNTER — Other Ambulatory Visit: Payer: Self-pay | Admitting: Cardiology

## 2019-07-23 ENCOUNTER — Telehealth: Payer: Self-pay | Admitting: Cardiology

## 2019-07-23 DIAGNOSIS — E876 Hypokalemia: Secondary | ICD-10-CM | POA: Diagnosis not present

## 2019-07-23 DIAGNOSIS — E119 Type 2 diabetes mellitus without complications: Secondary | ICD-10-CM | POA: Diagnosis not present

## 2019-07-23 NOTE — Telephone Encounter (Signed)
Patient's wife calling stating that the patient's endocrinologist at [facility] is requesting the patient be seen tomorrow by Dr. Bettina Gavia. I did not see anything available tomorrow with Dr. Bettina Gavia.

## 2019-07-23 NOTE — Telephone Encounter (Signed)
Spoke to patients wife just now and she let me know that the endocrinologist stated that the patient needed to be seen tomorrow due to SOB and fluid buildup. I go the patient scheduled with Dr. Harriet Masson in [place] tomorrow at 9:40 AM. She verbalizes understanding of this appointment and does not have any other issues or concerns at this time.

## 2019-07-24 ENCOUNTER — Ambulatory Visit: Payer: Medicare Other | Admitting: Cardiology

## 2019-07-24 ENCOUNTER — Emergency Department (HOSPITAL_COMMUNITY): Payer: Medicare Other

## 2019-07-24 ENCOUNTER — Encounter (HOSPITAL_COMMUNITY): Payer: Self-pay | Admitting: Emergency Medicine

## 2019-07-24 ENCOUNTER — Other Ambulatory Visit: Payer: Self-pay

## 2019-07-24 ENCOUNTER — Inpatient Hospital Stay (HOSPITAL_COMMUNITY)
Admission: EM | Admit: 2019-07-24 | Discharge: 2019-07-31 | DRG: 291 | Disposition: A | Discharge status: Home / Self Care | Payer: Medicare Other | Attending: Internal Medicine | Admitting: Internal Medicine

## 2019-07-24 ENCOUNTER — Encounter: Payer: Self-pay | Admitting: Cardiology

## 2019-07-24 VITALS — BP 120/56 | HR 94 | Ht 70.0 in | Wt 223.2 lb

## 2019-07-24 DIAGNOSIS — J811 Chronic pulmonary edema: Secondary | ICD-10-CM | POA: Diagnosis not present

## 2019-07-24 DIAGNOSIS — I10 Essential (primary) hypertension: Secondary | ICD-10-CM | POA: Diagnosis not present

## 2019-07-24 DIAGNOSIS — I8393 Asymptomatic varicose veins of bilateral lower extremities: Secondary | ICD-10-CM | POA: Diagnosis not present

## 2019-07-24 DIAGNOSIS — Z944 Liver transplant status: Secondary | ICD-10-CM

## 2019-07-24 DIAGNOSIS — I509 Heart failure, unspecified: Secondary | ICD-10-CM

## 2019-07-24 DIAGNOSIS — Z79899 Other long term (current) drug therapy: Secondary | ICD-10-CM

## 2019-07-24 DIAGNOSIS — E876 Hypokalemia: Secondary | ICD-10-CM

## 2019-07-24 DIAGNOSIS — E1122 Type 2 diabetes mellitus with diabetic chronic kidney disease: Secondary | ICD-10-CM | POA: Diagnosis present

## 2019-07-24 DIAGNOSIS — E119 Type 2 diabetes mellitus without complications: Secondary | ICD-10-CM

## 2019-07-24 DIAGNOSIS — J90 Pleural effusion, not elsewhere classified: Secondary | ICD-10-CM | POA: Diagnosis not present

## 2019-07-24 DIAGNOSIS — T502X5A Adverse effect of carbonic-anhydrase inhibitors, benzothiadiazides and other diuretics, initial encounter: Secondary | ICD-10-CM | POA: Diagnosis not present

## 2019-07-24 DIAGNOSIS — I5033 Acute on chronic diastolic (congestive) heart failure: Secondary | ICD-10-CM | POA: Diagnosis not present

## 2019-07-24 DIAGNOSIS — Z8505 Personal history of malignant neoplasm of liver: Secondary | ICD-10-CM | POA: Diagnosis not present

## 2019-07-24 DIAGNOSIS — I071 Rheumatic tricuspid insufficiency: Secondary | ICD-10-CM | POA: Diagnosis not present

## 2019-07-24 DIAGNOSIS — Z7982 Long term (current) use of aspirin: Secondary | ICD-10-CM | POA: Diagnosis not present

## 2019-07-24 DIAGNOSIS — N183 Chronic kidney disease, stage 3 unspecified: Secondary | ICD-10-CM | POA: Diagnosis present

## 2019-07-24 DIAGNOSIS — Z20822 Contact with and (suspected) exposure to covid-19: Secondary | ICD-10-CM | POA: Diagnosis not present

## 2019-07-24 DIAGNOSIS — Z794 Long term (current) use of insulin: Secondary | ICD-10-CM

## 2019-07-24 DIAGNOSIS — I130 Hypertensive heart and chronic kidney disease with heart failure and stage 1 through stage 4 chronic kidney disease, or unspecified chronic kidney disease: Principal | ICD-10-CM | POA: Diagnosis present

## 2019-07-24 DIAGNOSIS — R0602 Shortness of breath: Secondary | ICD-10-CM | POA: Diagnosis present

## 2019-07-24 DIAGNOSIS — I110 Hypertensive heart disease with heart failure: Secondary | ICD-10-CM | POA: Diagnosis not present

## 2019-07-24 HISTORY — DX: Acute on chronic diastolic (congestive) heart failure: I50.33

## 2019-07-24 LAB — BASIC METABOLIC PANEL
Anion gap: 13 (ref 5–15)
Anion gap: 17 — ABNORMAL HIGH (ref 5–15)
BUN: 64 mg/dL — ABNORMAL HIGH (ref 8–23)
BUN: 70 mg/dL — ABNORMAL HIGH (ref 8–23)
CO2: 29 mmol/L (ref 22–32)
CO2: 27 mmol/L (ref 22–32)
Calcium: 9.7 mg/dL (ref 8.9–10.3)
Calcium: 9.4 mg/dL (ref 8.9–10.3)
Chloride: 100 mmol/L (ref 98–111)
Chloride: 93 mmol/L — ABNORMAL LOW (ref 98–111)
Creatinine, Ser: 1.83 mg/dL — ABNORMAL HIGH (ref 0.61–1.24)
Creatinine, Ser: 2.35 mg/dL — ABNORMAL HIGH (ref 0.61–1.24)
GFR calc Af Amer: 44 mL/min — ABNORMAL LOW (ref >60)
GFR calc Af Amer: 32 mL/min — ABNORMAL LOW (ref >60)
GFR calc non Af Amer: 38 mL/min — ABNORMAL LOW (ref >60)
GFR calc non Af Amer: 28 mL/min — ABNORMAL LOW (ref >60)
Glucose, Bld: 105 mg/dL — ABNORMAL HIGH (ref 70–99)
Glucose, Bld: 133 mg/dL — ABNORMAL HIGH (ref 70–99)
Potassium: 3 mmol/L — ABNORMAL LOW (ref 3.5–5.1)
Potassium: 3.7 mmol/L (ref 3.5–5.1)
Sodium: 142 mmol/L (ref 135–145)
Sodium: 137 mmol/L (ref 135–145)

## 2019-07-24 LAB — CBC
HCT: 47.3 % (ref 39.0–52.0)
Hemoglobin: 14.9 g/dL (ref 13.0–17.0)
MCH: 29.6 pg (ref 26.0–34.0)
MCHC: 31.5 g/dL (ref 30.0–36.0)
MCV: 94 fL (ref 80.0–100.0)
Platelets: 107 K/uL — ABNORMAL LOW (ref 150–400)
RBC: 5.03 MIL/uL (ref 4.22–5.81)
RDW: 16.9 % — ABNORMAL HIGH (ref 11.5–15.5)
WBC: 6.2 K/uL (ref 4.0–10.5)
nRBC: 0 % (ref 0.0–0.2)

## 2019-07-24 LAB — HEPATIC FUNCTION PANEL
ALT: 17 U/L (ref 0–44)
AST: 30 U/L (ref 15–41)
Albumin: 4.3 g/dL (ref 3.5–5.0)
Alkaline Phosphatase: 80 U/L (ref 38–126)
Bilirubin, Direct: 0.5 mg/dL — ABNORMAL HIGH (ref 0.0–0.2)
Indirect Bilirubin: 1.3 mg/dL — ABNORMAL HIGH (ref 0.3–0.9)
Total Bilirubin: 1.8 mg/dL — ABNORMAL HIGH (ref 0.3–1.2)
Total Protein: 6.7 g/dL (ref 6.5–8.1)

## 2019-07-24 LAB — HIV ANTIBODY (ROUTINE TESTING W REFLEX): HIV Screen 4th Generation wRfx: NONREACTIVE

## 2019-07-24 LAB — BRAIN NATRIURETIC PEPTIDE: B Natriuretic Peptide: 292.1 pg/mL — ABNORMAL HIGH (ref 0.0–100.0)

## 2019-07-24 LAB — MAGNESIUM: Magnesium: 2.2 mg/dL (ref 1.7–2.4)

## 2019-07-24 LAB — TROPONIN I (HIGH SENSITIVITY): Troponin I (High Sensitivity): 38 ng/L — ABNORMAL HIGH

## 2019-07-24 LAB — CBG MONITORING, ED: Glucose-Capillary: 154 mg/dL — ABNORMAL HIGH (ref 70–99)

## 2019-07-24 LAB — PROTIME-INR
INR: 1.2 (ref 0.8–1.2)
Prothrombin Time: 15 seconds (ref 11.4–15.2)

## 2019-07-24 LAB — GLUCOSE, CAPILLARY: Glucose-Capillary: 154 mg/dL — ABNORMAL HIGH (ref 70–99)

## 2019-07-24 LAB — SARS CORONAVIRUS 2 BY RT PCR (HOSPITAL ORDER, PERFORMED IN ~~LOC~~ HOSPITAL LAB): SARS Coronavirus 2: NEGATIVE

## 2019-07-24 LAB — HEMOGLOBIN A1C
Hgb A1c MFr Bld: 6 % — ABNORMAL HIGH (ref 4.8–5.6)
Mean Plasma Glucose: 125.5 mg/dL

## 2019-07-24 MED ORDER — POTASSIUM CHLORIDE 10 MEQ/100ML IV SOLN
10.0000 meq | Freq: Once | INTRAVENOUS | Status: AC
  Administered 2019-07-24: 10 meq via INTRAVENOUS
  Filled 2019-07-24: qty 100

## 2019-07-24 MED ORDER — POTASSIUM CHLORIDE CRYS ER 20 MEQ PO TBCR
40.0000 meq | EXTENDED_RELEASE_TABLET | Freq: Once | ORAL | Status: AC
  Administered 2019-07-24: 40 meq via ORAL
  Filled 2019-07-24: qty 2

## 2019-07-24 MED ORDER — ASPIRIN 81 MG PO CHEW
81.0000 mg | CHEWABLE_TABLET | Freq: Every day | ORAL | Status: DC
  Administered 2019-07-25 – 2019-07-31 (×7): 81 mg via ORAL
  Filled 2019-07-24 (×7): qty 1

## 2019-07-24 MED ORDER — HYDRALAZINE HCL 25 MG PO TABS
12.5000 mg | ORAL_TABLET | Freq: Every day | ORAL | Status: DC
  Administered 2019-07-24 – 2019-07-30 (×7): 12.5 mg via ORAL
  Filled 2019-07-24 (×7): qty 1

## 2019-07-24 MED ORDER — TACROLIMUS 0.5 MG PO CAPS: 0.5000 mg | ORAL_CAPSULE | ORAL | Status: DC

## 2019-07-24 MED ORDER — ALLOPURINOL 300 MG PO TABS
300.0000 mg | ORAL_TABLET | Freq: Every day | ORAL | Status: DC
  Administered 2019-07-25 – 2019-07-31 (×7): 300 mg via ORAL
  Filled 2019-07-24 (×7): qty 1

## 2019-07-24 MED ORDER — SODIUM CHLORIDE 0.9% FLUSH: 3.0000 mL | Freq: Once | INTRAVENOUS | Status: DC

## 2019-07-24 MED ORDER — FUROSEMIDE 10 MG/ML IJ SOLN
80.0000 mg | Freq: Once | INTRAMUSCULAR | Status: AC
  Administered 2019-07-24: 80 mg via INTRAVENOUS
  Filled 2019-07-24: qty 8

## 2019-07-24 MED ORDER — GEMFIBROZIL 600 MG PO TABS
300.0000 mg | ORAL_TABLET | Freq: Two times a day (BID) | ORAL | Status: DC
  Administered 2019-07-24 – 2019-07-31 (×14): 300 mg via ORAL
  Filled 2019-07-24 (×16): qty 0.5

## 2019-07-24 MED ORDER — HYDRALAZINE HCL 25 MG PO TABS
25.0000 mg | ORAL_TABLET | Freq: Every day | ORAL | Status: DC
  Administered 2019-07-25 – 2019-07-31 (×7): 25 mg via ORAL
  Filled 2019-07-24 (×7): qty 1

## 2019-07-24 MED ORDER — TACROLIMUS 0.5 MG PO CAPS
0.5000 mg | ORAL_CAPSULE | Freq: Every day | ORAL | Status: DC
  Administered 2019-07-25 – 2019-07-31 (×7): 0.5 mg via ORAL
  Filled 2019-07-24 (×7): qty 1

## 2019-07-24 MED ORDER — HYDRALAZINE HCL 25 MG PO TABS: 12.5000 mg | ORAL_TABLET | Freq: Two times a day (BID) | ORAL | Status: DC

## 2019-07-24 MED ORDER — INSULIN ASPART 100 UNIT/ML ~~LOC~~ SOLN
0.0000 [IU] | Freq: Three times a day (TID) | SUBCUTANEOUS | Status: DC
  Administered 2019-07-24: 4 [IU] via SUBCUTANEOUS
  Administered 2019-07-26 – 2019-07-30 (×2): 3 [IU] via SUBCUTANEOUS

## 2019-07-24 MED ORDER — POTASSIUM CHLORIDE 10 MEQ/100ML IV SOLN: 10.0000 meq | INTRAVENOUS | Status: DC

## 2019-07-24 MED ORDER — MYCOPHENOLATE MOFETIL 250 MG PO CAPS
750.0000 mg | ORAL_CAPSULE | Freq: Two times a day (BID) | ORAL | Status: DC
  Administered 2019-07-24 – 2019-07-31 (×14): 750 mg via ORAL
  Filled 2019-07-24 (×15): qty 3

## 2019-07-24 MED ORDER — INSULIN GLARGINE 100 UNIT/ML ~~LOC~~ SOLN
75.0000 [IU] | Freq: Every day | SUBCUTANEOUS | Status: DC
  Administered 2019-07-24 – 2019-07-30 (×7): 75 [IU] via SUBCUTANEOUS
  Filled 2019-07-24 (×8): qty 0.75

## 2019-07-24 MED ORDER — SODIUM CHLORIDE 0.9% FLUSH
3.0000 mL | Freq: Two times a day (BID) | INTRAVENOUS | Status: DC
  Administered 2019-07-24 – 2019-07-30 (×11): 3 mL via INTRAVENOUS

## 2019-07-24 MED ORDER — ENOXAPARIN SODIUM 40 MG/0.4ML ~~LOC~~ SOLN
40.0000 mg | SUBCUTANEOUS | Status: DC
  Administered 2019-07-24 – 2019-07-30 (×7): 40 mg via SUBCUTANEOUS
  Filled 2019-07-24 (×7): qty 0.4

## 2019-07-24 MED ORDER — INSULIN ASPART 100 UNIT/ML ~~LOC~~ SOLN: 0.0000 [IU] | Freq: Every day | SUBCUTANEOUS | Status: DC

## 2019-07-24 MED ORDER — TACROLIMUS 0.5 MG PO CAPS
0.5000 mg | ORAL_CAPSULE | ORAL | Status: DC
  Administered 2019-07-25 – 2019-07-29 (×3): 0.5 mg via ORAL
  Filled 2019-07-24 (×4): qty 1

## 2019-07-24 MED ORDER — FUROSEMIDE 10 MG/ML IJ SOLN
80.0000 mg | Freq: Two times a day (BID) | INTRAMUSCULAR | Status: DC
  Filled 2019-07-24: qty 8

## 2019-07-24 MED ORDER — ADULT MULTIVITAMIN W/MINERALS CH
1.0000 | ORAL_TABLET | Freq: Every day | ORAL | Status: DC
  Administered 2019-07-25 – 2019-07-31 (×7): 1 via ORAL
  Filled 2019-07-24 (×7): qty 1

## 2019-07-24 NOTE — Progress Notes (Signed)
Cardiology Office Note:    Date:  07/24/2019   ID:  AYDYN TESTERMAN, DOB 20-Jun-1955, MRN [id number]  PCP:  Ronita Hipps, MD  Cardiologist:  Shirlee More, MD  Electrophysiologist:  None   Referring MD: Ronita Hipps, MD   " I am not doing too well"  History of Present Illness:    Gregory Tanner is a 64 y.o. male with a hx of liver transplantation, hypertension chronic kidney disease with diastolic heart failure was last seen by Dr. Bettina Gavia on 06/17/2019 at that time he did have edema which was thought to be multifactorial in the setting of his liver disease, CKD and diastolic heart failure.  He is here today for follow-up visit.  As well.  He also tells me that he had not been able to lay flat, he is short of breath on exertion and has had significant vomiting leg edema.  Past Medical History:  Diagnosis Date  . Ankylosis of lumbar spine 02/21/2014  . Benign essential hypertension 10/26/2015  . BP (high blood pressure) 12/27/2010  . Chronic pain associated with significant psychosocial dysfunction 06/17/2015  . Chronic pain disorder 10/28/2015  . Degeneration of intervertebral disc of lumbar region 07/05/2013  . Degenerative disc disease, lumbar 07/26/2012  . Diabetes mellitus without complication ([place])   . Encounter for other specified prophylactic measures 10/10/2011  . Excess weight 10/10/2011  . Hearing loss   . Hepatocellular carcinoma ([place]) 12/27/2010   Overview:  Embolized 6/12   . History of liver transplant ([place]) 07/20/2011   Overview:  06/07/2011 for cryptogenic cirrhosis and HCC   . Hypertension   . Liver disease   . Lumbar radiculopathy 10/10/2012  . Nerve root pain 06/17/2015   Overview:  Right   . SOB (shortness of breath) on exertion 10/28/2015  . Swelling   . Type 2 diabetes mellitus ([place]) 12/27/2010   Overview:  H1C 6.5 12/2010     Past Surgical History:  Procedure Laterality Date  . BACK SURGERY    . BAKER CYST SURGERY     RIGHT LEG  . EYE SURGERY Left   .  HERNIA REPAIR    . LIVER SURGERY     IMPLANT  . LIVER TRANSPLANT    . TONSILL SURGERY      Current Medications: Current Meds  Medication Sig  . allopurinol (ZYLOPRIM) 300 MG tablet TAKE 1 TABLET BY MOUTH ONCE (1) DAILY  . aspirin 81 MG tablet Take 81 mg by mouth daily.  . bumetanide (BUMEX) 2 MG tablet Take 1 tablet (2 mg total) by mouth 2 (two) times daily.  Marland Kitchen gemfibrozil (LOPID) 600 MG tablet TAKE 1/2 TABLET BY MOUTH TWICE (2) DAILY BEFORE A MEAL  . hydrALAZINE (APRESOLINE) 25 MG tablet Take by mouth 25 mg in the AM and 12.5 mg in the PM.  . insulin regular (NOVOLIN R) 100 units/mL injection Inject 115 Units into the skin daily with breakfast. Then 20-40 with snacks, and 120 units with supper  . metolazone (ZAROXOLYN) 5 MG tablet TAKE 1 TABLET BY MOUTH ONCE a WEEK. TAKE 45 MINUTES prior TO bumex dose  . Multiple Vitamin (MULTIVITAMIN) tablet Take 1 tablet by mouth daily.  . potassium chloride SA (KLOR-CON) 20 MEQ tablet Take 20 mEq by mouth 2 (two) times daily.  . tacrolimus (PROGRAF) 0.5 MG capsule Take 0.5 capsules by mouth as directed. Takes 1 capsule in the am, and 1 capsule at night every other night     Allergies:  Iodinated diagnostic agents, Latex, Metformin and related, and Rosiglitazone   Social History   Socioeconomic History  . Marital status: Married    Spouse name: Not on file  . Number of children: Not on file  . Years of education: Not on file  . Highest education level: Not on file  Occupational History  . Not on file  Tobacco Use  . Smoking status: Never Smoker  . Smokeless tobacco: Never Used  Substance and Sexual Activity  . Alcohol use: No    Alcohol/week: 0.0 standard drinks  . Drug use: No  . Sexual activity: Not on file  Other Topics Concern  . Not on file  Social History Narrative  . Not on file   Social Determinants of Health   Financial Resource Strain:   . Difficulty of Paying Living Expenses:   Food Insecurity:   . Worried About  Charity fundraiser in the Last Year:   . Arboriculturist in the Last Year:   Transportation Needs:   . Film/video editor (Medical):   Marland Kitchen Lack of Transportation (Non-Medical):   Physical Activity:   . Days of Exercise per Week:   . Minutes of Exercise per Session:   Stress:   . Feeling of Stress :   Social Connections:   . Frequency of Communication with Friends and Family:   . Frequency of Social Gatherings with Friends and Family:   . Attends Religious Services:   . Active Member of Clubs or Organizations:   . Attends Archivist Meetings:   Marland Kitchen Marital Status:      Family History: The patient's family history includes Cancer in his mother; Diabetes in his father; Heart attack in his father; Hypertension in his brother.  ROS:   Review of Systems  Constitution: Negative for decreased appetite, fever and weight gain.  HENT: Negative for congestion, ear discharge, hoarse voice and sore throat.   Eyes: Negative for discharge, redness, vision loss in right eye and visual halos.  Cardiovascular: Reports dyspnea on exertion, leg swelling, and orthopnea.  Negative for chest pain, dyspnea on exertion, leg swelling, orthopnea and palpitations.  Respiratory: Reports shortness of breath.  Negative for cough, hemoptysis, and snoring.   Endocrine: Negative for heat intolerance and polyphagia.  Hematologic/Lymphatic: Negative for bleeding problem. Does not bruise/bleed easily.  Skin: Negative for flushing, nail changes, rash and suspicious lesions.  Musculoskeletal: Negative for arthritis, joint pain, muscle cramps, myalgias, neck pain and stiffness.  Gastrointestinal: Negative for abdominal pain, bowel incontinence, diarrhea and excessive appetite.  Genitourinary: Negative for decreased libido, genital sores and incomplete emptying.  Neurological: Negative for brief paralysis, focal weakness, headaches and loss of balance.  Psychiatric/Behavioral: Negative for altered mental  status, depression and suicidal ideas.  Allergic/Immunologic: Negative for HIV exposure and persistent infections.    EKGs/Labs/Other Studies Reviewed:    The following studies were reviewed today:   EKG:  None today   TTE Impression 07/03/2019 1. Left ventricular ejection fraction, by estimation, is 60 to 65%. The left ventricle has normal function. The left ventricle has no regional wall motion abnormalities.  2. Left atrial size was mild to moderately dilated.  3. Tricuspid valve regurgitation is mild to moderate.  4. The aortic valve is normal in structure. Aortic valve regurgitation is not visualized. Mild aortic valve sclerosis is present, with no evidence of aortic valve stenosis.  5. There is mild to moderate dilatation of the ascending aorta measuring 43 mm.  6. The  inferior vena cava is dilated in size with <50% respiratory variability, suggesting right atrial pressure of 15 mmHg.  Recent Labs: No results found for requested labs within last 8760 hours.  Recent Lipid Panel    Component Value Date/Time   CHOL 87 (L) 08/30/2018 0842   TRIG 192 (H) 08/30/2018 0842   HDL 24 (L) 08/30/2018 0842   CHOLHDL 3.6 08/30/2018 0842   LDLCALC 25 08/30/2018 0842    Physical Exam:    VS:  BP (!) 120/56 (BP Location: Left Arm, Patient Position: Sitting, Cuff Size: Normal)   Pulse 94   Ht 5\' 10"  (1.778 m)   Wt 223 lb 3.2 oz (101.2 kg)   BMI 32.03 kg/m     Wt Readings from Last 3 Encounters:  07/24/19 223 lb 3.2 oz (101.2 kg)  06/17/19 219 lb 12.8 oz (99.7 kg)  12/19/18 216 lb (98 kg)     GEN: Generalized anasarca, well nourished, well developed in no acute distress HEENT: Normal NECK: + JVD; No carotid bruits LYMPHATICS: No lymphadenopathy CARDIAC: S1S2 noted,RRR, no murmurs, rubs, gallops RESPIRATORY:  Clear to auscultation without rales, wheezing or rhonchi  ABDOMEN: Soft, non-tender, non-distended, +bowel sounds, no guarding. EXTREMITIES: bilateral +3 up to knee  edema, No cyanosis, no clubbing MUSCULOSKELETAL:  No deformity  SKIN: Warm and dry NEUROLOGIC:  Alert and oriented x 3, non-focal PSYCHIATRIC:  Normal affect, good insight  ASSESSMENT:    1. Acute on chronic diastolic heart failure ([place])   2. Rheumatic tricuspid valve regurgitation   3. Stage 3 chronic kidney disease, unspecified whether stage 3a or 3b CKD   4. History of liver transplant ([place])    PLAN:    Appears to be volume overloaded clinically in the office, he does have a noted JVD, and generalized anasarca with significant bilateral +3 leg edema.  I do not believe that the patient will be clinically with increasing his oral diuretics.  I do believe that he should be admitted for IV diuretics.  The other complicating factor in this is he has significant hypokalemia which also can be addressed in the inpatient setting.  Spoke with the patient is agreeable to go to the emergency department for this treatment.    The patient is in agreement with the above plan. The patient left the office in stable condition.  The patient will follow up with Dr. Bettina Gavia   Medication Adjustments/Labs and Tests Ordered: Current medicines are reviewed at length with the patient today.  Concerns regarding medicines are outlined above.  No orders of the defined types were placed in this encounter.  No orders of the defined types were placed in this encounter.   Patient Instructions  Medication Instructions:  Your physician recommends that you continue on your current medications as directed. Please refer to the Current Medication list given to you today.  *If you need a refill on your cardiac medications before your next appointment, please call your pharmacy*   Lab Work: None.  If you have labs (blood work) drawn today and your tests are completely normal, you will receive your results only by: Marland Kitchen MyChart Message (if you have MyChart) OR . A paper copy in the mail If you have any lab test that  is abnormal or we need to change your treatment, we will call you to review the results.   Testing/Procedures: None.    Follow-Up: At [facility], you and your health needs are our priority.  As part of our continuing mission to  provide you with exceptional heart care, we have created designated Provider Care Teams.  These Care Teams include your primary Cardiologist (physician) and Advanced Practice Providers (APPs -  Physician Assistants and Nurse Practitioners) who all work together to provide you with the care you need, when you need it.  We recommend signing up for the patient portal called "MyChart".  Sign up information is provided on this After Visit Summary.  MyChart is used to connect with patients for Virtual Visits (Telemedicine).  Patients are able to view lab/test results, encounter notes, upcoming appointments, etc.  Non-urgent messages can be sent to your provider as well.   To learn more about what you can do with MyChart, go to [email or web address].    Your next appointment:   2 week(s)  The format for your next appointment:   In Person  Provider:   Shirlee More, MD   Other Instructions     Adopting a Healthy Lifestyle.  Know what a healthy weight is for you (roughly BMI <25) and aim to maintain this   Aim for 7+ servings of fruits and vegetables daily   65-80+ fluid ounces of water or unsweet tea for healthy kidneys   Limit to max 1 drink of alcohol per day; avoid smoking/tobacco   Limit animal fats in diet for cholesterol and heart health - choose grass fed whenever available   Avoid highly processed foods, and foods high in saturated/trans fats   Aim for low stress - take time to unwind and care for your mental health   Aim for 150 min of moderate intensity exercise weekly for heart health, and weights twice weekly for bone health   Aim for 7-9 hours of sleep daily   When it comes to diets, agreement about the perfect plan isnt easy to find,  even among the experts. Experts at the [employer] developed an idea known as the Healthy Eating Plate. Just imagine a plate divided into logical, healthy portions.   The emphasis is on diet quality:   Load up on vegetables and fruits - one-half of your plate: Aim for color and variety, and remember that potatoes dont count.   Go for whole grains - one-quarter of your plate: Whole wheat, barley, wheat berries, quinoa, oats, brown rice, and foods made with them. If you want pasta, go with whole wheat pasta.   Protein power - one-quarter of your plate: Fish, chicken, beans, and nuts are all healthy, versatile protein sources. Limit red meat.   The diet, however, does go beyond the plate, offering a few other suggestions.   Use healthy plant oils, such as olive, canola, soy, corn, sunflower and peanut. Check the labels, and avoid partially hydrogenated oil, which have unhealthy trans fats.   If youre thirsty, drink water. Coffee and tea are good in moderation, but skip sugary drinks and limit milk and dairy products to one or two daily servings.   The type of carbohydrate in the diet is more important than the amount. Some sources of carbohydrates, such as vegetables, fruits, whole grains, and beans-are healthier than others.   Finally, stay active  Signed, Berniece Salines, DO  07/24/2019 10:21 AM    [place]

## 2019-07-24 NOTE — ED Provider Notes (Signed)
[facility]ERG[facility] DEPARTMENT Provider Note   CSN: [id number] Arrival date & time: 07/24/19  1108     History No chief complaint on file.   Gregory Tanner is a 64 y.o. male.  HPI 64 year old male presents with shortness of breath, leg swelling, and low potassium.  The patient states that he has been having issues with fluid buildup for a while.  Has been on Bumex.  Takes it as needed, has been taking it regularly.  Continue to have worsening swelling.  His potassium was checked recently and has been low.  Around 3.  He is also on oral potassium at home.  Has been feeling short of breath.  No chest pain.  Is up-to-date on his Covid vaccines.  Saw his cardiologist today who recommended he come in for IV diuresis and potassium supplementation. Feels like his abdomen has been swollen as well.   Past Medical History:  Diagnosis Date  . Ankylosis of lumbar spine 02/21/2014  . Benign essential hypertension 10/26/2015  . BP (high blood pressure) 12/27/2010  . Chronic pain associated with significant psychosocial dysfunction 06/17/2015  . Chronic pain disorder 10/28/2015  . Degeneration of intervertebral disc of lumbar region 07/05/2013  . Degenerative disc disease, lumbar 07/26/2012  . Diabetes mellitus without complication ([place])   . Encounter for other specified prophylactic measures 10/10/2011  . Excess weight 10/10/2011  . Hearing loss   . Hepatocellular carcinoma ([place]) 12/27/2010   Overview:  Embolized 6/12   . History of liver transplant ([place]) 07/20/2011   Overview:  06/07/2011 for cryptogenic cirrhosis and HCC   . Hypertension   . Liver disease   . Lumbar radiculopathy 10/10/2012  . Nerve root pain 06/17/2015   Overview:  Right   . SOB (shortness of breath) on exertion 10/28/2015  . Swelling   . Type 2 diabetes mellitus ([place]) 12/27/2010   Overview:  H1C 6.5 12/2010     Patient Active Problem List   Diagnosis Date Noted  . CKD (chronic kidney disease) stage 3, GFR  30-59 ml/min 03/27/2017  . Chronic diastolic heart failure ([place]) 09/26/2016  . Chronic pain disorder 10/28/2015  . SOB (shortness of breath) on exertion 10/28/2015  . Hypertensive heart disease with heart failure ([place]) 10/26/2015  . Chronic pain associated with significant psychosocial dysfunction 06/17/2015  . Nerve root pain 06/17/2015  . Ankylosis of lumbar spine 02/21/2014  . Degeneration of intervertebral disc of lumbar region 07/05/2013  . Lumbar radiculopathy 10/10/2012  . Degenerative disc disease, lumbar 07/26/2012  . Encounter for other specified prophylactic measures 10/10/2011  . Excess weight 10/10/2011  . History of liver transplant ([place]) 07/20/2011  . Type 2 diabetes mellitus ([place]) 12/27/2010  . Hepatocellular carcinoma ([place]) 12/27/2010    Past Surgical History:  Procedure Laterality Date  . BACK SURGERY    . BAKER CYST SURGERY     RIGHT LEG  . EYE SURGERY Left   . HERNIA REPAIR    . LIVER SURGERY     IMPLANT  . LIVER TRANSPLANT    . TONSILL SURGERY         Family History  Problem Relation Age of Onset  . Cancer Mother   . Diabetes Father   . Heart attack Father   . Hypertension Brother     Social History   Tobacco Use  . Smoking status: Never Smoker  . Smokeless tobacco: Never Used  Substance Use Topics  . Alcohol use: No    Alcohol/week: 0.0 standard  drinks  . Drug use: No    Home Medications Prior to Admission medications   Medication Sig Start Date End Date Taking? Authorizing Provider  allopurinol (ZYLOPRIM) 300 MG tablet TAKE 1 TABLET BY MOUTH ONCE (1) DAILY 08/12/16   [provider]  aspirin 81 MG tablet Take 81 mg by mouth daily.    [provider]  bumetanide (BUMEX) 2 MG tablet Take 1 tablet (2 mg total) by mouth 2 (two) times daily. 06/17/19   Richardo Priest, MD  gemfibrozil (LOPID) 600 MG tablet TAKE 1/2 TABLET BY MOUTH TWICE (2) DAILY BEFORE A MEAL 02/27/19   Munley, Hilton Cork, MD  hydrALAZINE (APRESOLINE) 25 MG  tablet Take by mouth 25 mg in the AM and 12.5 mg in the PM. 12/19/18   [provider]  insulin regular (NOVOLIN R) 100 units/mL injection Inject 115 Units into the skin daily with breakfast. Then 20-40 with snacks, and 120 units with supper    [provider]  metolazone (ZAROXOLYN) 5 MG tablet TAKE 1 TABLET BY MOUTH ONCE a WEEK. TAKE 45 MINUTES prior TO bumex dose 05/09/19   Richardo Priest, MD  Multiple Vitamin (MULTIVITAMIN) tablet Take 1 tablet by mouth daily.    [provider]  potassium chloride SA (KLOR-CON) 20 MEQ tablet Take 20 mEq by mouth 2 (two) times daily. 06/06/19   [provider]  tacrolimus (PROGRAF) 0.5 MG capsule Take 0.5 capsules by mouth as directed. Takes 1 capsule in the am, and 1 capsule at night every other night 06/12/15   [provider]    Allergies    Iodinated diagnostic agents, Latex, Metformin and related, and Rosiglitazone  Review of Systems   Review of Systems  Constitutional: Negative for fever.  Respiratory: Positive for shortness of breath.   Cardiovascular: Positive for leg swelling. Negative for chest pain.  All other systems reviewed and are negative.   Physical Exam Updated Vital Signs BP (!) 143/62 (BP Location: Left Arm)   Pulse 94   Temp 98.2 F (36.8 C) (Oral)   Resp (!) 22   SpO2 97%   Physical Exam Vitals and nursing note reviewed.  Constitutional:      General: He is not in acute distress.    Appearance: He is well-developed. He is not ill-appearing or diaphoretic.  HENT:     Head: Normocephalic and atraumatic.     Right Ear: External ear normal.     Left Ear: External ear normal.     Nose: Nose normal.  Eyes:     General:        Right eye: No discharge.        Left eye: No discharge.  Cardiovascular:     Rate and Rhythm: Normal rate and regular rhythm.     Heart sounds: Normal heart sounds.  Pulmonary:     Effort: Tachypnea present. No accessory muscle usage.     Breath sounds:  Examination of the right-lower field reveals rales. Examination of the left-lower field reveals rales. Rales present.  Abdominal:     General: There is distension.     Palpations: Abdomen is soft.     Tenderness: There is no abdominal tenderness.  Musculoskeletal:     Cervical back: Neck supple.     Right lower leg: Edema present.     Left lower leg: Edema present.     Comments: Pitting edema from feet to knees bilaterally  Skin:    General: Skin is warm and  dry.  Neurological:     Mental Status: He is alert.  Psychiatric:        Mood and Affect: Mood is not anxious.     ED Results / Procedures / Treatments   Labs (all labs ordered are listed, but only abnormal results are displayed) Labs Reviewed  BASIC METABOLIC PANEL - Abnormal; Notable for the following components:      Result Value   Potassium 3.0 (*)    Glucose, Bld 105 (*)    BUN 64 (*)    Creatinine, Ser 1.83 (*)    GFR calc non Af Amer 38 (*)    GFR calc Af Amer 44 (*)    All other components within normal limits  CBC - Abnormal; Notable for the following components:   RDW 16.9 (*)    Platelets 107 (*)    All other components within normal limits  BRAIN NATRIURETIC PEPTIDE - Abnormal; Notable for the following components:   B Natriuretic Peptide 292.1 (*)    All other components within normal limits  TROPONIN I (HIGH SENSITIVITY) - Abnormal; Notable for the following components:   Troponin I (High Sensitivity) 38 (*)    All other components within normal limits  SARS CORONAVIRUS 2 BY RT PCR (HOSPITAL ORDER, [facility] [facility])  MAGNESIUM    EKG EKG Interpretation  Date/Time:  Wednesday July 24 2019 11:17:37 EDT Ventricular Rate:  92 PR Interval:  148 QRS Duration: 118 QT Interval:  422 QTC Calculation: 521 R Axis:   126 Text Interpretation: Normal sinus rhythm Possible Left atrial enlargement Right axis deviation Incomplete right bundle branch block Right ventricular hypertrophy  Nonspecific ST and T wave abnormality Prolonged QT Interpretation limited secondary to artifact Confirmed by Sherwood Gambler [phone]) on 07/24/2019 1:28:00 PM   Radiology DG Chest 2 View  Result Date: 07/24/2019 CLINICAL DATA:  Shortness of breath EXAM: CHEST - 2 VIEW COMPARISON:  November 23, 2010 FINDINGS: There is cardiomegaly with pulmonary venous hypertension. There is mild interstitial edema. There is a small right pleural effusion. There are scattered tiny calcifications, likely granulomas. No airspace opacity. No adenopathy. No bone lesions. There are surgical clips in the gastroesophageal junction region. IMPRESSION: Cardiomegaly with pulmonary vascular congestion. Small right pleural effusion with probable mild interstitial edema. Overall the appearance is felt to be indicative of a degree of volume congestive heart failure. No consolidation. Tiny calcified granulomas noted. Electronically Signed   By: Lowella Grip III M.D.   On: 07/24/2019 12:34    Procedures Procedures (including critical care time)  Medications Ordered in ED Medications  sodium chloride flush (NS) 0.9 % injection 3 mL (3 mLs Intravenous Not Given 07/24/19 1440)  potassium chloride 10 mEq in 100 mL IVPB (10 mEq Intravenous New Bag/Given 07/24/19 1452)  potassium chloride SA (KLOR-CON) CR tablet 40 mEq (40 mEq Oral Given 07/24/19 1440)  furosemide (LASIX) injection 80 mg (80 mg Intravenous Given 07/24/19 1441)    ED Course  I have reviewed the triage vital signs and the nursing notes.  Pertinent labs & imaging results that were available during my care of the patient were reviewed by me and considered in my medical decision making (see chart for details).    MDM Rules/Calculators/A&P                      Patient sent over from cardiology clinic for IV diuresis.  I agree with this impression given he is having worsening fluid  retention despite being on Bumex.  Also moderately hypokalemic at 3.0.  He will need further  potassium replacement.  Give IV Lasix.  He has a little tachypnea but no distress or accessory muscle use.  His troponin is mildly elevated but he has no anginal symptoms.  Likely this is from CHF.  Discussed with internal medicine teaching service for admission. Final Clinical Impression(s) / ED Diagnoses Final diagnoses:  Acute on chronic congestive heart failure, unspecified heart failure type ([place])  Hypokalemia    Rx / DC Orders ED Discharge Orders    None       Sherwood Gambler, MD 07/24/19 1454

## 2019-07-24 NOTE — Progress Notes (Signed)
Patient arrived to 3E03 from [facility]. A&Ox4. VSS. SR on telemetry. No complaints of pain. No skin break down. CHF plan of care initiated.

## 2019-07-24 NOTE — Plan of Care (Signed)
?  Problem: Activity: ?Goal: Capacity to carry out activities will improve ?Outcome: Progressing ?  ?

## 2019-07-24 NOTE — ED Triage Notes (Signed)
Pt here from home with c/o bil leg swelling and sob , pt had a liver transplant in 2013 and is followed by [facility] , pt told to come over to ED due to a low K

## 2019-07-24 NOTE — Progress Notes (Signed)
Entered in error

## 2019-07-24 NOTE — Patient Instructions (Signed)
Medication Instructions:  Your physician recommends that you continue on your current medications as directed. Please refer to the Current Medication list given to you today.  *If you need a refill on your cardiac medications before your next appointment, please call your pharmacy*   Lab Work: None.  If you have labs (blood work) drawn today and your tests are completely normal, you will receive your results only by: Marland Kitchen MyChart Message (if you have MyChart) OR . A paper copy in the mail If you have any lab test that is abnormal or we need to change your treatment, we will call you to review the results.   Testing/Procedures: None.    Follow-Up: At [facility], you and your health needs are our priority.  As part of our continuing mission to provide you with exceptional heart care, we have created designated Provider Care Teams.  These Care Teams include your primary Cardiologist (physician) and Advanced Practice Providers (APPs -  Physician Assistants and Nurse Practitioners) who all work together to provide you with the care you need, when you need it.  We recommend signing up for the patient portal called "MyChart".  Sign up information is provided on this After Visit Summary.  MyChart is used to connect with patients for Virtual Visits (Telemedicine).  Patients are able to view lab/test results, encounter notes, upcoming appointments, etc.  Non-urgent messages can be sent to your provider as well.   To learn more about what you can do with MyChart, go to [email or web address].    Your next appointment:   2 week(s)  The format for your next appointment:   In Person  Provider:   Shirlee More, MD   Other Instructions

## 2019-07-24 NOTE — H&P (Addendum)
Date: 07/24/2019               Patient Name:  Gregory Tanner MRN: [id number]  DOB: Aug 04, 1955 Age / Sex: 64 y.o., male   PCP: Ronita Hipps, MD              Medical Service: Internal Medicine Teaching Service              Attending Physician: Dr. Aldine Contes, MD    First Contact: Nanetta Batty, [place] 3 Pager: [phone]  Second Contact: Dr. Al Decant Pager: [phone]  Third Contact Dr. Molli Hazard Pager: [phone]       After Hours (After 5p/  First Contact Pager: [phone]  weekends / holidays): Second Contact Pager: [phone]   Chief Complaint: Shortness of breath  History of Present Illness:   Gregory Caldron. Tanner is a 64 year old male with a past medical history significant for insulin-dependent diabetes mellitus type II, s/p liver transplant (07/2011) for hepatocellular carcinoma, hypertension, chronic kidney disease stage III, and chronic diastolic heart failure who presented to the emergency department with progressive shortness of breath and lower extremity edema. History was obtained from the patient and the patient's wife, who was at bedside.  Gregory Tanner states that he first noted edema in his lower extremities 2 weeks ago. He says that the edema has gotten progressively worse over the past 2 weeks and he began to develop shortness of breath. He reports dyspnea on exertion, and describes symptoms consistent with orthopnea with difficulty breathing when laying flat. The patient states that he has had a similar episode to this in the past, particularly orthopnea, that was resolved with diureses. Additionally, he reports that he has had difficulty with retaining fluid since his liver transplant in 2013. He is currently on metolazone and bumetanide outpatient and has not missed any doses. He denies any fevers, chills, chest pain, palpitations.  Patient offered that he has had problems with hypokalemia, especially in the setting of home diuretic therapy. He is taking potassium  chloride outpatient. He denies any recent nausea, vomiting, or diarrhea.  The patient reports that he consistently takes his medications and has no difficulty acquiring his medications.   Past Medical History: - Hepatocellular Carcinoma (12/27/2010): Embolized (06/12) - Chronic Diastolic Heart Failure (15/40/0867) - Chronic Kidney Disease stage III (03/27/2017) - Hypertension (12/27/2010) - Diabetes Mellitus type II (12/27/2010) - Ankylosis of the lumbar spine (02/21/2014) - Chronic pain disorder (10/28/2015)  Past Surgical History: - s/p Liver transplant (07/2011) - s/p Arthrodesis posterior lumbar spine (01/14/2013) - s/p Hernia repair - s/p Tonsillectomy   Meds: Current Meds  Medication Sig  . allopurinol (ZYLOPRIM) 300 MG tablet Take 300 mg by mouth daily.   Marland Kitchen aspirin 81 MG tablet Take 81 mg by mouth daily.  . bumetanide (BUMEX) 2 MG tablet Take 1 tablet (2 mg total) by mouth 2 (two) times daily.  Marland Kitchen gemfibrozil (LOPID) 600 MG tablet TAKE 1/2 TABLET BY MOUTH TWICE (2) DAILY BEFORE A MEAL (Patient taking differently: Take 300 mg by mouth 2 (two) times daily before a meal. )  . hydrALAZINE (APRESOLINE) 25 MG tablet Take 12.5-25 mg by mouth in the morning and at bedtime.   . insulin regular (NOVOLIN R) 100 units/mL injection Inject 115 Units into the skin daily with breakfast. Then 20-40 with snacks, and 120 units with supper  . metolazone (ZAROXOLYN) 5 MG tablet TAKE 1 TABLET BY MOUTH ONCE a WEEK. TAKE 45 MINUTES prior TO bumex dose (Patient  taking differently: Take 5 mg by mouth once a week. Take 45 minutes prior to bumex dose.)  . Multiple Vitamin (MULTIVITAMIN) tablet Take 1 tablet by mouth daily.  . mycophenolate (CELLCEPT) 250 MG capsule Take 750 mg by mouth 2 (two) times daily.  . potassium chloride SA (KLOR-CON) 20 MEQ tablet Take 20 mEq by mouth 2 (two) times daily.  . tacrolimus (PROGRAF) 0.5 MG capsule Take 0.5 capsules by mouth See admin instructions. Takes 1 capsule in  the am, and 1 capsule at night every other night     Allergies: Allergies as of 07/24/2019 - Review Complete 07/24/2019  Allergen Reaction Noted  . Iodinated diagnostic agents Hives and Other (See Comments) 11/17/2014  . Latex Other (See Comments) 06/17/2015  . Metformin and related Nausea And Vomiting 03/27/2017  . Rosiglitazone Nausea And Vomiting 10/26/2015    Family History: Patient's mother had cancer. His father had diabetes and history of MI. Patients brother has hypertension.  Social History: Patient lives at home with his wife. Patient does all ADLs and IADLs with little to no assistance. Patient has never smoked cigarettes or used any tobacco products. Patient drinks a 6 pack of beer every 2 weeks to a month. Patient does not use any illegal or illicit substances.  Review of Systems: Review of Systems  Constitutional: Negative for chills, diaphoresis, fever and malaise/fatigue.       Has gained 10-15 lbs over past 2 weeks.  HENT: Negative for ear pain, hearing loss, sore throat and tinnitus.   Eyes: Negative for blurred vision and double vision.  Respiratory: Negative for cough, sputum production and wheezing.   Cardiovascular:       Per HPI.  Gastrointestinal: Negative for abdominal pain and constipation.  Genitourinary: Negative for dysuria, frequency and urgency.  Musculoskeletal: Negative.   Skin: Negative for itching and rash.  Neurological: Positive for dizziness. Negative for tingling, tremors, loss of consciousness and headaches.  Endo/Heme/Allergies: Negative.   Psychiatric/Behavioral: Negative.      Physical Exam: Vitals: Blood pressure (!) 143/62, pulse 94, temperature 98.2 F (36.8 C), temperature source Oral, resp. rate (!) 22, SpO2 97 %. Physical Exam  Constitutional: He is well-developed, well-nourished, and in no distress.  HENT:  Head: Normocephalic and atraumatic.  Eyes: Pupils are equal, round, and reactive to light. Conjunctivae and EOM are  normal.  Cardiovascular: Normal rate, regular rhythm and normal heart sounds. Exam reveals no gallop and no friction rub.  No murmur heard. Pulmonary/Chest: Effort normal. No respiratory distress. He has no wheezes. He has no rales.  Abdominal: Soft. Bowel sounds are normal. He exhibits no distension. There is no abdominal tenderness.  Musculoskeletal:        General: Edema present. Normal range of motion.     Comments: 3+ bilateral lower extremity pitting edema.  Skin: He is not diaphoretic.   Pertinent Diagnostic Tests:  EKG: personally reviewed my interpretation is normal sinus rhythm.   CXR: personally reviewed my interpretation is cardiomegaly with pulmonary vascular congestion. Small right pleural effusion.  BMP: Na - 142 K - 3.0 Cl - 100 CO2 - 29 BUN - 64 Cr - 1.83 Ca - 9.7 Mg  - 2.2 Glucose - 105  CBC: WBC - 6.2 Hbg - 14.9 Hct - 47.3 Plt - 107  Troponin I: 38 BNP: 292.1 Hbg A1c: 6.0  LFTs: AST - 30 ALT - 17 Alk Phos: 80 Tot Bili: 1.8 Direct Bili: 0.5 Indirect Bili: 1.3  PT: 15.0 INR: 1.2  Assessment &  Plan by Problem: Active Problems:   Heart failure, diastolic, acute on chronic (HCC)  Gregory Caldron. Milks is a 64 year old male with a past medical history significant for insulin-dependent diabetes mellitus type II, s/p liver transplant (07/2011) for hepatocellular carcinoma, hypertension, chronic kidney disease stage III, and chronic diastolic heart failure who presented to the emergency department with progressive shortness of breath and lower extremity edema. With a chest xray illustrating pulmonary vascular congestion and a small right pleural effusion.   # Acute on Chronic Diastolic Heart Failure Possible causes of acute lower extremity edema include acute heart failure, nephrotic syndrome, side-effects from medications (such as dihydropyridine CCB and vasodilators). The most likely etiology of this patients acute bilateral lower extremity edema is  acute decompensated  heart failure. This would also explain the shortness of breath, orthopnea, and findings of pulmonary vascular congestion and pleural effusions on chest xray. Given patients medical history of chronic diastolic heart failure, BNP of 292.1, and episodes similar to this in the past that resolved with IV diuretics, it is likely the patient will improve with a similar treatment plan. It is unlikely that this is caused by nephrotic syndrome as patient albumin and total protein serum levels are within normal limits, as well as his PT and INR. Additionally, the patient does not have anasarca or facial edema that is commonly seen in nephrotic syndrome. Drug-induced lower extremity edema is also unlikely. The patient is currently taking Hydralazine that has been associated with edema and fluid retention due to its vasodilatory effects (Direct-Acting Vasodilators, Cohn 2011). The patient has been tolerating his hydralazine well long-term without any adverse effects. While unlikely, it is worth considering holding hydralazine if patient does not improve clinically with IV diuretics. Plan: - Give patient diuretic to remove fluid in patient who clinically appears to be volume overloaded. Start IV Lasix 80 mg BID. - In the setting of Lasix, a potassium-wasting diuretic, and the patient is currently hypokalemic we will need to supplement potassium. Give patient potassium chloride 40 mEq PO. - The patient was hypokalemic on admission and is getting diuresed. Therefore, electrolytes will need to be closely monitored. Additionally, with the patient having a history of CKD receiving aggressive diuresis the kidney function will need to be monitor closely via BUN and creatinine. Will get routine basic metabolic panels.  # Hypokalemia Possible causes of hypokalemia include vomiting, diarrhea, and drug-induced. The most likely etiology of this patient's hypokalemia is drug-induced. The patient is on bumetanide  and metolazone, both of which have been shown to cause hypokalemia. This has been a chronic problem and the patient is currently taking potassium chloride 20 mEq at home. It is unlikely the cause of the hypokalemia is from vomiting or diarrhea as the patient denies these symptoms recently. Plan: - The patient's potassium is being supplemented with potasium chloride 40 mEq PO for the initial hypokalemia on admission, as well as for the diuretic therapy the patient will be receiving for his hypervolemia. - Potassium will need to be closely monitored as patient has had chronically low potassium and patient is receiving potassium-wasting diuretic therapy. Will obtain routine basic metabolic panels.  # Chronic Kidney Disease stage III - The patient's BUN and creatinine is currently at baseline but will need to be monitored with diuresis. Will obtain routine basic metabolic panels. May need outpatient nephro follow up.  # Hypertension - Patient has chronic history of hypertension that has been managed outpatient with hydralazine. Continue home Hydralazine 25 BID. If patient's  lower extremity edema does not improve, consider holding hydralazine or initiating beta-blocker.  # s/p Liver Transplant 2013 # Hepatocellular Carcinoma - Patient diagnosed with Hepatocellular Carcinoma that embolized in 2012. Underwent liver transplant in 2013. Continue home tacrolimus 0.5 mg PO 1 capsule in the am and 1 capsule every other night. Continue home mycophenolate 750 mg PO BID.  # Insulin-dependent Diabetes mellitus type II - Patient is currently on regular insulin at home taking 105 units in the morning and 100 units at night. We do not carry regular insulin in the hospital. Will start insulin glargine 75 units daily at bedtime. Additionally, will place patient on heavily resistant sliding scale insulin aspart. - Patient will need routine cutaneous blood glucose monitoring.  Code status: Full code Diet: Regular VTE  prophylaxis: Lovenox   Dispo: Admit patient to Inpatient with expected length of stay greater than 2 midnights.  Signed: Nanetta Batty, Medical Student 07/24/2019, 3:32 PM   Attestation for Student Documentation:  I personally was present and performed or re-performed the history, physical exam and medical decision-making activities of this service and have verified that the service and findings are accurately documented in the student's note.  Al Decant, MD 07/24/2019, 7:10 PM

## 2019-07-25 DIAGNOSIS — I5033 Acute on chronic diastolic (congestive) heart failure: Secondary | ICD-10-CM

## 2019-07-25 LAB — GLUCOSE, CAPILLARY
Glucose-Capillary: 112 mg/dL — ABNORMAL HIGH (ref 70–99)
Glucose-Capillary: 102 mg/dL — ABNORMAL HIGH (ref 70–99)
Glucose-Capillary: 107 mg/dL — ABNORMAL HIGH (ref 70–99)
Glucose-Capillary: 107 mg/dL — ABNORMAL HIGH (ref 70–99)

## 2019-07-25 LAB — BASIC METABOLIC PANEL
Anion gap: 13 (ref 5–15)
BUN: 69 mg/dL — ABNORMAL HIGH (ref 8–23)
CO2: 25 mmol/L (ref 22–32)
Calcium: 9.3 mg/dL (ref 8.9–10.3)
Chloride: 100 mmol/L (ref 98–111)
Creatinine, Ser: 1.97 mg/dL — ABNORMAL HIGH (ref 0.61–1.24)
GFR calc Af Amer: 40 mL/min — ABNORMAL LOW (ref >60)
GFR calc non Af Amer: 35 mL/min — ABNORMAL LOW (ref >60)
Glucose, Bld: 99 mg/dL (ref 70–99)
Potassium: 2.8 mmol/L — ABNORMAL LOW (ref 3.5–5.1)
Sodium: 138 mmol/L (ref 135–145)

## 2019-07-25 LAB — CBC
HCT: 46.2 % (ref 39.0–52.0)
Hemoglobin: 14.7 g/dL (ref 13.0–17.0)
MCH: 29.5 pg (ref 26.0–34.0)
MCHC: 31.8 g/dL (ref 30.0–36.0)
MCV: 92.8 fL (ref 80.0–100.0)
Platelets: 99 10*3/uL — ABNORMAL LOW (ref 150–400)
RBC: 4.98 MIL/uL (ref 4.22–5.81)
RDW: 17.1 % — ABNORMAL HIGH (ref 11.5–15.5)
WBC: 6.7 10*3/uL (ref 4.0–10.5)
nRBC: 0 % (ref 0.0–0.2)

## 2019-07-25 MED ORDER — FUROSEMIDE 10 MG/ML IJ SOLN
120.0000 mg | Freq: Two times a day (BID) | INTRAVENOUS | Status: DC
  Administered 2019-07-25 – 2019-07-30 (×10): 120 mg via INTRAVENOUS
  Filled 2019-07-25: qty 2
  Filled 2019-07-25: qty 12
  Filled 2019-07-25: qty 10
  Filled 2019-07-25 (×2): qty 12
  Filled 2019-07-25: qty 10
  Filled 2019-07-25: qty 12
  Filled 2019-07-25: qty 10
  Filled 2019-07-25: qty 2
  Filled 2019-07-25 (×2): qty 12

## 2019-07-25 MED ORDER — SODIUM CHLORIDE 0.9 % IV SOLN
INTRAVENOUS | Status: DC | PRN
  Administered 2019-07-25: 250 mL via INTRAVENOUS

## 2019-07-25 MED ORDER — POTASSIUM CHLORIDE CRYS ER 20 MEQ PO TBCR
40.0000 meq | EXTENDED_RELEASE_TABLET | Freq: Three times a day (TID) | ORAL | Status: DC
  Administered 2019-07-25 (×3): 40 meq via ORAL
  Filled 2019-07-25 (×4): qty 2

## 2019-07-25 MED ORDER — MENTHOL 3 MG MT LOZG
1.0000 | LOZENGE | OROMUCOSAL | Status: DC | PRN
  Administered 2019-07-25 – 2019-07-28 (×3): 3 mg via ORAL
  Filled 2019-07-25: qty 9

## 2019-07-25 NOTE — Progress Notes (Signed)
CRITICAL VALUE ALERT  Critical Value:  Potassium 2.8  Date & Time Notied:  0830 Provider Notified: yes Dr. Madilyn Fireman  Orders Received/Actions taken: Yes Mar for PO K-Dur

## 2019-07-25 NOTE — Progress Notes (Signed)
  Date: 07/25/2019  Patient name: Gregory Tanner  Medical record number: [id number]  Date of birth: 1955/05/21   I have seen and evaluated Gregory Tanner and discussed their care with the Residency Team. In brief, patient is 64 year old male with a past medical history of insulin-dependent type 2 diabetes, hepatocellular carcinoma status post liver transplant in June 2013, hypertension, CKD stage IIIb and chronic diastolic heart failure who presented to the ED with progressive shortness of breath and lower extremity edema over the last couple weeks.  Patient states that approximately 2 weeks ago he noted swelling in his legs which progressively worsened since then. He also had associated shortness of breath with dyspnea on exertion and orthopnea. Patient has had similar episodes to this in the past that required diuresis. Patient is on metolazone bumetanide as an outpatient and has not missed any doses. No fevers or chills, no chest pain, no palpitations, no diaphoresis, no headache, no blurry vision, no focal weakness, no tingling or numbness, no nausea or vomiting, no diarrhea, no abdominal pain.  Today patient states that his breathing is slightly better but that his lower extremity swelling remains unchanged. He is concerned that the current dose of Lasix is not working and we discussed this with him. Patient was also concerned about his current insulin regimen but his sugars have been well controlled currently.  PMHx, Fam Hx, and/or Soc Hx : As per resident admit note  Vitals:   07/25/19 0736 07/25/19 1122  BP: (!) 141/70 (!) 141/68  Pulse: 98 88  Resp: 17 17  Temp: 97.9 F (36.6 C) 98.3 F (36.8 C)  SpO2: 95% 96%   General: Awake, alert, oriented x3, NAD CVS: Regular rhythm, heart sounds Lungs: CTA bilaterally Abdomen: Soft, nontender, nondistended, normoactive bowel sounds Extremities: 2+ bilateral lower extremity pitting edema noted, nontender to palpation Psych: Normal mood  and affect HEENT: Normocephalic, atraumatic Skin: Warm and dry  Assessment and Plan: I have seen and evaluated the patient as outlined above. I agree with the formulated Assessment and Plan as detailed in the residents' note, with the following changes:   1. Acute on chronic diastolic heart failure exacerbation: -Patient presented to the ED with progressive swelling in his lower extremities as well as shortness of breath over the last couple of weeks in setting of a history of chronic diastolic heart failure and failed outpatient diuresis and is found to have an elevated BNP and pulmonary vascular congestion on his chest x-ray as well as 3+ bilateral lower extremity pitting edema on exam consistent with an acute on chronic diastolic heart failure exacerbation. -Patient started on Lasix 80 mg IV twice daily but was only net -280 mL. Will increase Lasix to 120 mg IV twice daily today -Patient had a recent echo in May 2021 which showed an EF of 60 to 65% and normal LV systolic function -Patient also noted to be hypokalemic likely secondary to diuresis. We will supplement his potassium aggressively -We will continue to monitor his BMP closely while diuresing him (creatinine mildly worse today than on admission) -Continue with strict I's and O's and daily weights -No further work-up at this time.  Aldine Contes, MD 6/10/202112:54 PM

## 2019-07-25 NOTE — Progress Notes (Addendum)
Inpatient Diabetes Program Recommendations  AACE/ADA: New Consensus Statement on Inpatient Glycemic Control (2015)  Target Ranges:  Prepandial:   less than 140 mg/dL      Peak postprandial:   less than 180 mg/dL (1-2 hours)      Critically ill patients:  140 - 180 mg/dL   Lab Results  Component Value Date   GLUCAP 102 (H) 07/25/2019   HGBA1C 6.0 (H) 07/24/2019    Review of Glycemic Control  Diabetes history: DM 2 Outpatient Diabetes medications: humulin R U-500 105 units breakfast, 50-60 units at lunch (if eaten), 100 units suppertime Current orders for Inpatient glycemic control:  Lantus 75 units qhs Novolog 0-20 units tid + hs  Inpatient Diabetes Program Recommendations:    Recommend reordering Humulin R U-500 insulin from PTA home meds list when listed right and order 80% of doses.  Humulin R U-500 85 units breakfast, 60 units lunch, 80 units at supper.  Spoke with pt at bedside. Pt reports seeing Dr. Solon Palm, Endocrinology at [facility]. Pt's last A1c was 5.7% per report in computer showing 6% this admission. Pt reports taking Humulin R U-500 105 units breakfast, 50-60 units at lunch (if eaten), 100 units suppertime.  Pt reports not eating lunch at home at times. And does not restrict what he eats while on U-500.  Pt ordered regular diet currently. Spoke with Maylon Cos 3rd year MS about plan of care and that we have Humulin R U-500 on formulary here.  Thanks,  Tama Headings RN, MSN, BC-ADM Inpatient Diabetes Coordinator Team Pager [phone] (8a-5p)

## 2019-07-25 NOTE — Progress Notes (Signed)
Brief History:  Gregory Tanner. Reaser is a 64 year old male with a past medical history significant for insulin-dependent diabetes mellitus type II, s/p liver transplant (07/2011) for hepatocellular carcinoma, hypertension, chronic kidney disease stage III, and chronic diastolic heart failure who presented to the emergency department with progressive shortness of breath and lower extremity edema.  Subjective:  Gregory Tanner was evaluated and examined this morning bedside. He was sitting upright at the edge of the bed. He reports that his shortness of breath is improving. Patient states that the Lasix was not making him urinate. He denies any difficulty urinating.  Patient was sightly concerned about his hyperglycemia medications. We discussed the changes in his hyperglycemia medications while in the hospital and that we are constantly monitoring his blood glucose levels.   Objective:  Vital signs in last 24 hours: Vitals:   07/25/19 0247 07/25/19 0346 07/25/19 0736 07/25/19 1122  BP:  130/65 (!) 141/70 (!) 141/68  Pulse:  88 98 88  Resp:  17 17 17   Temp:  97.7 F (36.5 C) 97.9 F (36.6 C) 98.3 F (36.8 C)  TempSrc:  Oral  Oral  SpO2:  94% 95% 96%  Weight: 100.2 kg     Height:       Weight change:   Intake/Output Summary (Last 24 hours) at 07/25/2019 1128 Last data filed at 07/25/2019 0850 Gross per 24 hour  Intake 1240 ml  Output 1000 ml  Net 240 ml   Physical Exam Constitutional:      General: He is not in acute distress. HENT:     Head: Normocephalic and atraumatic.     Mouth/Throat:     Mouth: Mucous membranes are moist.  Eyes:     Extraocular Movements: Extraocular movements intact.  Cardiovascular:     Rate and Rhythm: Normal rate and regular rhythm.     Heart sounds: No murmur heard.  No friction rub. No gallop.   Pulmonary:     Effort: Pulmonary effort is normal. No respiratory distress.     Breath sounds: No stridor. Examination of the right-lower field  reveals decreased breath sounds. Decreased breath sounds present. No wheezing or rales.  Abdominal:     General: Bowel sounds are normal.     Tenderness: There is no abdominal tenderness.  Musculoskeletal:        General: Normal range of motion.     Right lower leg: 3+ Pitting Edema present.     Left lower leg: 3+ Pitting Edema present.  Skin:    General: Skin is warm and dry.  Neurological:     Mental Status: He is alert and oriented to person, place, and time.  Psychiatric:        Mood and Affect: Mood and affect normal.     Assessment/Plan:  Active Problems:   Heart failure, diastolic, acute on chronic (HCC)  Gregory Tanner. Gregory Tanner is a 64 year old male with a past medical history significant for insulin-dependent diabetes mellitus type II, s/p liver transplant (07/2011) for hepatocellular carcinoma, hypertension, chronic kidney disease stage III, and chronic diastolic heart failure who presented to the emergency department with progressive shortness of breath and lower extremity edema. With a chest xray illustrating pulmonary vascular congestion and a small right pleural effusion.  # Acute on Chronic Diastolic Heart Failure Patient with bilateral lower extremity edema and shortness of breath that was though to be secondary to chronic diastolic heart failure. He is on bumetanide and metolazone at home. Patient started  on IV Lasix 80 mg BID yesterday for hypervolemia. Patient reported Lasix is not making him urinate like it should/has in the past. Per chart, I/O - 230 mL yesterday. Patient has 3+ bilateral lower extremity pitting edema and imaging revealed pulmonary vascular congestion and mild right sided pleural effusion. Will need to increase Lasix dosage and reevaluate patient to ensure proper diuresis. Plan: - Increase IV lasix to 120 mg BID as the original 80 mg BID dosage was not adequately diuresing. Will reassess volume status tomorrow. - The patient was hypokalemic on admission and  is getting diuresed. Therefore, electrolytes will need to be closely monitored. Additionally, with the patient having a history of CKD receiving aggressive diuresis the kidney function will need to be monitor closely via BUN and creatinine. Will get routine basic metabolic panels.  # Hypokalemia This is most likely induced from diuretic therapy as patient is on metolazone and bumetanide at home and is currently receiving IV Lasix. Patient's potassium was 2.8 this morning. Plan: - The patient's potassium is being supplemented with potasium chloride 40 mEq PO. - Potassium will need to be closely monitored as patient has had chronically low potassium and patient is receiving potassium-wasting diuretic therapy. Will obtain routine basic metabolic panels.  # Chronic Kidney Disease stage III - The patient's BUN and creatinine is currently at baseline but will need to be monitored with diuresis. Will obtain routine basic metabolic panels. May need outpatient nephro follow up.  # Hypertension - Patient has chronic history of hypertension that has been managed outpatient with hydralazine. Continue home Hydralazine 25 BID. If patient's lower extremity edema does not improve, consider holding hydralazine or initiating beta-blocker.  # s/p Liver Transplant 2013 # Hepatocellular Carcinoma - Patient diagnosed with Hepatocellular Carcinoma that embolized in 2012. Underwent liver transplant in 2013. Continue home tacrolimus 0.5 mg PO 1 capsule in the am and 1 capsule every other night. Continue home mycophenolate 750 mg PO BID.  # Insulin-dependent Diabetes mellitus type II - Patient is currently on regular insulin at home taking 105 units in the morning and 100 units at night. Patient's glucose has been well controlled inpatient, was 112 this morning. Spoke with Diabetes Coordinator, Larene Beach, who proposed option to switch to Humulin R U-500 insulin at 80% of home dosing. However, glucose levels have been  well controlled inpatient. Will continue Lantus 75 units qhs and Novolog SSI at his time. - Routine CBG monitoring.  Code status: Full code Diet: Regular VTE prophylaxis: Lovenox   Dispo: Admit patient to Inpatient with expected length of stay greater than 2 midnights.    LOS: 1 day   Nanetta Batty, Medical Student 07/25/2019, 11:28 AM

## 2019-07-25 NOTE — Plan of Care (Signed)

## 2019-07-26 DIAGNOSIS — E876 Hypokalemia: Secondary | ICD-10-CM

## 2019-07-26 LAB — GLUCOSE, CAPILLARY
Glucose-Capillary: 107 mg/dL — ABNORMAL HIGH (ref 70–99)
Glucose-Capillary: 96 mg/dL (ref 70–99)
Glucose-Capillary: 140 mg/dL — ABNORMAL HIGH (ref 70–99)
Glucose-Capillary: 99 mg/dL (ref 70–99)

## 2019-07-26 LAB — BASIC METABOLIC PANEL
Anion gap: 14 (ref 5–15)
BUN: 77 mg/dL — ABNORMAL HIGH (ref 8–23)
CO2: 27 mmol/L (ref 22–32)
Calcium: 9.6 mg/dL (ref 8.9–10.3)
Chloride: 97 mmol/L — ABNORMAL LOW (ref 98–111)
Creatinine, Ser: 2.04 mg/dL — ABNORMAL HIGH (ref 0.61–1.24)
GFR calc Af Amer: 38 mL/min — ABNORMAL LOW (ref >60)
GFR calc non Af Amer: 33 mL/min — ABNORMAL LOW (ref >60)
Glucose, Bld: 89 mg/dL (ref 70–99)
Potassium: 3.4 mmol/L — ABNORMAL LOW (ref 3.5–5.1)
Sodium: 138 mmol/L (ref 135–145)

## 2019-07-26 MED ORDER — POTASSIUM CHLORIDE CRYS ER 20 MEQ PO TBCR
30.0000 meq | EXTENDED_RELEASE_TABLET | Freq: Three times a day (TID) | ORAL | Status: DC
  Administered 2019-07-26 – 2019-07-27 (×4): 30 meq via ORAL
  Filled 2019-07-26 (×4): qty 1

## 2019-07-26 MED ORDER — METOLAZONE 2.5 MG PO TABS
2.5000 mg | ORAL_TABLET | Freq: Every morning | ORAL | Status: DC
  Administered 2019-07-26 – 2019-07-31 (×6): 2.5 mg via ORAL
  Filled 2019-07-26 (×6): qty 1

## 2019-07-26 NOTE — Progress Notes (Signed)
Brief History:  Gregory Tanner is a 64 year old male with a past medical history significant for insulin-dependent diabetes mellitus type II, s/p liver transplant (07/2011) for hepatocellular carcinoma, hypertension, chronic kidney disease stage III, and chronic diastolic heart failure who presented to the emergency department with progressive shortness of breath and lower extremity edema.  Subjective:  Gregory Tanner was evaluated and examined this morning bedside. He was sitting upright in a chair this morning. He reports that his shortness of breath is getting better, however, he is still experiencing orthopnea. Patient reports increased urine production since increasing dose of Lasix yesterday.   Objective:  Vital signs in last 24 hours: Vitals:   07/25/19 1632 07/25/19 1946 07/26/19 0034 07/26/19 0404  BP: 133/66 (!) 144/67 126/63 127/67  Pulse: 88 90 91 89  Resp: 17 17 19 17   Temp: (!) 97.4 F (36.3 C) 97.8 F (36.6 C) 97.7 F (36.5 C) 98.4 F (36.9 C)  TempSrc: Oral Oral Oral Oral  SpO2: 94% 95% 93% 94%  Weight:    101 kg  Height:       Weight change: 0.499 kg  Intake/Output Summary (Last 24 hours) at 07/26/2019 0900 Last data filed at 07/26/2019 2025 Gross per 24 hour  Intake 1602 ml  Output 2575 ml  Net -973 ml   Physical Exam Constitutional:      General: He is not in acute distress. HENT:     Head: Normocephalic and atraumatic.     Mouth/Throat:     Mouth: Mucous membranes are moist.  Eyes:     Extraocular Movements: Extraocular movements intact.  Cardiovascular:     Rate and Rhythm: Normal rate and regular rhythm.     Heart sounds: No friction rub. No gallop.   Pulmonary:     Effort: Pulmonary effort is normal. No respiratory distress.     Breath sounds: No stridor. Examination of the right-lower field reveals decreased breath sounds. Decreased breath sounds present. No wheezing or rales.  Abdominal:     General: Bowel sounds are normal.      Tenderness: There is no abdominal tenderness.  Musculoskeletal:        General: Normal range of motion.     Right lower leg: 3+ Pitting Edema present.     Left lower leg: 3+ Pitting Edema present.  Skin:    General: Skin is warm and dry.  Neurological:     Mental Status: He is alert and oriented to person, place, and time.  Psychiatric:        Mood and Affect: Mood and affect normal.     Assessment/Plan:  Active Problems:   Heart failure, diastolic, acute on chronic (HCC)  Gregory Tanner. Gregory Tanner is a 64 year old male with a past medical history significant for insulin-dependent diabetes mellitus type II, s/p liver transplant (07/2011) for hepatocellular carcinoma, hypertension, chronic kidney disease stage III, and chronic diastolic heart failure who presented to the emergency department with progressive shortness of breath and lower extremity edema. With a chest xray illustrating pulmonary vascular congestion and a small right pleural effusion.  # Acute on Chronic Diastolic Heart Failure Patient with bilateral lower extremity edema and shortness of breath that was though to be secondary to chronic diastolic heart failure. He is on bumetanide and metolazone at home. IV Lasix dose increased to 120 mg BID yesterday and patient reports increased urinary volume. Per chart, I/O -973 over the last 24 hours. Patient still endorsing orthopnea with 3+ bilateral  lower extremity pitting edema indicates he is still volume overloaded. Will add metolazone to increase diuresis. BUN and creatinine have remained stable since admission. Plan: - Continue IV Lasix 120 mg BID. - Start metolazone 2.5 mg PO once daily in the morning to increase diuresis. - Initiating fluid restricted (1500 mL) diet. Patient's fluid input yesterday was 2122 mL which mitigated the effects of diuresis. - The patient was hypokalemic on admission and is getting diuresed. Therefore, electrolytes will need to be closely monitored.  Additionally, with the patient having a history of CKD receiving aggressive diuresis the kidney function will need to be monitor closely via BUN and creatinine. Will get routine basic metabolic panels. - Daily weights and strict I/Os to monitor fluid status as we continue to diurese patient.  # Hypokalemia This is most likely induced from diuretic therapy as patient is on metolazone and bumetanide at home and is currently receiving IV Lasix. Patient's potassium was 2.8 yesterday and received potassium chloride 40 mEq PO TID. Potassium this morning is 3.4. Plan: - Give supplemental potassium chloride 30 mEq TID today. - Potassium will need to be closely monitored as patient has had chronically low potassium and patient is receiving potassium-wasting diuretic therapy. Will obtain routine basic metabolic panels.  # Chronic Kidney Disease stage III - The patient's BUN and creatinine is currently at baseline but will need to be monitored with diuresis. Will obtain routine basic metabolic panels. May need outpatient nephrology follow up.  # Hypertension - Patient has chronic history of hypertension that has been managed outpatient with hydralazine. Continue home Hydralazine 25 BID. If patient's lower extremity edema does not improve, consider holding hydralazine or initiating beta-blocker.  # s/p Liver Transplant 2013 # Hepatocellular Carcinoma - Patient diagnosed with Hepatocellular Carcinoma that embolized in 2012. Underwent liver transplant in 2013. Continue home tacrolimus 0.5 mg PO 1 capsule in the am and 1 capsule every other night. Continue home mycophenolate 750 mg PO BID.  # Insulin-dependent Diabetes mellitus type II - Patient's glycemia has been well controlled with inpatient medication regimen. CBG this morning was 107. Will continue Lantus 75 units qhs and Novolog SSI at his time. - Routine CBG monitoring.  Code status: Full code Diet: Fluid restricted: 1500 mL VTE prophylaxis:  Lovenox   Dispo: Admit patient to Inpatient with expected length of stay greater than 2 midnights.    LOS: 2 days   Nanetta Batty, Medical Student 07/26/2019, 9:00 AM

## 2019-07-26 NOTE — Progress Notes (Signed)
The patient is a 64 year old male presenting due to acute extremity edema. His past medical history includes chronic diastolic heart failure (37/85/8850), hypertension, diabetes mellitus type 2, chronic kidney disease stage III, liver transplant (07/2011). The diuretics he is taking at home include bumetanide (Bumex), 2mg  tablet BID, and metolazone (Zaroxolyn) 5mg  once a week. On admission date (07/24/2019), the team decided to hold these medications and started Furosemide IV 80mg  BID. The team increased the Furosemide to 120mg  BID on the next day to improve fluid diuresis. Net volume on 07/25/2019 is -281mL. On rounds today (07/26/2019), the patient has not improved clinically with IV diuretics at the current dose. Net volume today is +134mL. I suggested two possible options to the team. The first option is increasing the frequency of Furosemide IV to three times a day. According to UpToDate, the maximum recommended daily dose is 600mg , so 120 mg TID is still in the safe dosage range. The second option is to recommence metolazone on his home medication list. However, the metolazone dose should be 2.5mg  daily (30 minutes before furosemide administration in the morning). The team agreed on the second option and talked to the patient about this change.

## 2019-07-27 LAB — CBC
HCT: 45.7 % (ref 39.0–52.0)
Hemoglobin: 14.3 g/dL (ref 13.0–17.0)
MCH: 29.3 pg (ref 26.0–34.0)
MCHC: 31.3 g/dL (ref 30.0–36.0)
MCV: 93.6 fL (ref 80.0–100.0)
Platelets: 103 10*3/uL — ABNORMAL LOW (ref 150–400)
RBC: 4.88 MIL/uL (ref 4.22–5.81)
RDW: 16.8 % — ABNORMAL HIGH (ref 11.5–15.5)
WBC: 5.9 10*3/uL (ref 4.0–10.5)
nRBC: 0 % (ref 0.0–0.2)

## 2019-07-27 LAB — BASIC METABOLIC PANEL
Anion gap: 14 (ref 5–15)
BUN: 78 mg/dL — ABNORMAL HIGH (ref 8–23)
CO2: 26 mmol/L (ref 22–32)
Calcium: 9.2 mg/dL (ref 8.9–10.3)
Chloride: 100 mmol/L (ref 98–111)
Creatinine, Ser: 1.95 mg/dL — ABNORMAL HIGH (ref 0.61–1.24)
GFR calc Af Amer: 41 mL/min — ABNORMAL LOW (ref >60)
GFR calc non Af Amer: 35 mL/min — ABNORMAL LOW (ref >60)
Glucose, Bld: 75 mg/dL (ref 70–99)
Potassium: 3.2 mmol/L — ABNORMAL LOW (ref 3.5–5.1)
Sodium: 140 mmol/L (ref 135–145)

## 2019-07-27 LAB — GLUCOSE, CAPILLARY
Glucose-Capillary: 77 mg/dL (ref 70–99)
Glucose-Capillary: 91 mg/dL (ref 70–99)
Glucose-Capillary: 98 mg/dL (ref 70–99)
Glucose-Capillary: 121 mg/dL — ABNORMAL HIGH (ref 70–99)

## 2019-07-27 MED ORDER — POTASSIUM CHLORIDE 10 MEQ/100ML IV SOLN
10.0000 meq | INTRAVENOUS | Status: DC
  Administered 2019-07-27 (×2): 10 meq via INTRAVENOUS
  Filled 2019-07-27 (×2): qty 100

## 2019-07-27 MED ORDER — POTASSIUM CHLORIDE CRYS ER 20 MEQ PO TBCR
40.0000 meq | EXTENDED_RELEASE_TABLET | Freq: Three times a day (TID) | ORAL | Status: DC
  Administered 2019-07-27 – 2019-07-28 (×5): 40 meq via ORAL
  Filled 2019-07-27 (×5): qty 2

## 2019-07-27 NOTE — Progress Notes (Signed)
  Date: 07/27/2019  Patient name: Gregory Tanner  Medical record number: [id number]  Date of birth: 06/08/1955        I have seen and evaluated this patient and I have discussed the plan of care with the house staff. Please see their note for complete details. I concur with their findings with the following additions/corrections: [name] was seen this morning on team rounds.  He is net -2 L over the past 24 hours.  Creatinine remains stable as does his CO2.  He remains volume overloaded and diuresis will be continued with the current diuretic regimen.  [name], MD 07/27/2019, 2:00 PM

## 2019-07-27 NOTE — Progress Notes (Signed)
   Subjective: No acute events overnight.  Patient complaining of some cramping pain along the left lower extremity this morning that resolved spontaneously.  Discussed that this may be due to issues with electrolytes in setting of diuresis.  He continues to report orthopnea, but dyspnea is improving.  Has been able to ambulate in the hallway.  Objective:  Vital signs in last 24 hours: Vitals:   07/26/19 1924 07/27/19 0146 07/27/19 0333 07/27/19 0834  BP: (!) 150/74  131/61 (!) 129/57  Pulse: 92  88 92  Resp: 18  20 20   Temp: 97.7 F (36.5 C)  97.6 F (36.4 C) 98.3 F (36.8 C)  TempSrc: Oral  Oral Oral  SpO2: 96%  94% 96%  Weight:  100 kg    Height:       General: well-appearing elderly male, sitting up in bed, in NAD  Cardiac: regular rate and rhythm, nl T3/S2, III/VI systolic murmur  Pulm: CTAB, no wheezes or crackles, no increased work of breathing on RA  Ext: warm and well perfused, 2+ pitting edema bilateral lower extremities Derm: Several varicose veins on bilateral lower extremities   Assessment/Plan:  Principal Problem:   Heart failure, diastolic, acute on chronic (HCC) Active Problems:   Type 2 diabetes mellitus (HCC)   History of liver transplant ([place])   Hypokalemia   # AoC dCHF: Patient presented to the hospital with signs and symptoms of volume overload and was admitted for diuresis.  On Bumex and metolazone at home but was not responding.  IV Lasix was increased to 120 mg BID 2 days ago with inadequate urine output and metolazone 2.5 mg QD was added yesterday. Responded well to this, UOP -2L and weight is down 2 lbs. He also reports improvement in dyspnea and LE edema, though continues to report orthopnea. Will continue diuresing.  - Continue Lasix 120 mg twice daily and metolazone 2.5 mg daily - Continue fluid restriction  - Strict I's and O's plus daily weights - Replete electrolytes as needed  # CKD Stage III # HTN  SCr slightly improved from yesterday.  Monitoring while diuresing. Will continue routine BMP to assess renal function and lytes.  -Strict I's and O's + daily weights -Continue home hydralazine for BP control - Continue K supplementation, increased dose today 90 Eq--> 120 mEq which is his home dose  - Avoid nephrotoxins.   # Insulin-dependent T2DM: CBG at goal. Continue current regimen with Lantus 75 units + SSI-R + HS scale.   # Overly s/p liver transplant 2013: Continue home tacrolimus and MMF.    Prior to Admission Living Arrangement: Anticipated Discharge Location: Barriers to Discharge: Dispo: Pending improvement in volume status.  Welford Roche, MD 07/27/2019, 11:19 AM Pager: [phone] After 5pm on weekdays and 1pm on weekends: On Call pager [phone]

## 2019-07-28 LAB — BASIC METABOLIC PANEL
Anion gap: 13 (ref 5–15)
BUN: 75 mg/dL — ABNORMAL HIGH (ref 8–23)
CO2: 28 mmol/L (ref 22–32)
Calcium: 9.6 mg/dL (ref 8.9–10.3)
Chloride: 99 mmol/L (ref 98–111)
Creatinine, Ser: 1.98 mg/dL — ABNORMAL HIGH (ref 0.61–1.24)
GFR calc Af Amer: 40 mL/min — ABNORMAL LOW (ref >60)
GFR calc non Af Amer: 34 mL/min — ABNORMAL LOW (ref >60)
Glucose, Bld: 172 mg/dL — ABNORMAL HIGH (ref 70–99)
Potassium: 3.5 mmol/L (ref 3.5–5.1)
Sodium: 140 mmol/L (ref 135–145)

## 2019-07-28 LAB — GLUCOSE, CAPILLARY
Glucose-Capillary: 97 mg/dL (ref 70–99)
Glucose-Capillary: 99 mg/dL (ref 70–99)
Glucose-Capillary: 98 mg/dL (ref 70–99)
Glucose-Capillary: 134 mg/dL — ABNORMAL HIGH (ref 70–99)

## 2019-07-28 LAB — CBC
HCT: 46.7 % (ref 39.0–52.0)
Hemoglobin: 14.5 g/dL (ref 13.0–17.0)
MCH: 29.3 pg (ref 26.0–34.0)
MCHC: 31 g/dL (ref 30.0–36.0)
MCV: 94.3 fL (ref 80.0–100.0)
Platelets: 100 10*3/uL — ABNORMAL LOW (ref 150–400)
RBC: 4.95 MIL/uL (ref 4.22–5.81)
RDW: 16.9 % — ABNORMAL HIGH (ref 11.5–15.5)
WBC: 5.2 10*3/uL (ref 4.0–10.5)
nRBC: 0 % (ref 0.0–0.2)

## 2019-07-28 LAB — MAGNESIUM: Magnesium: 2.4 mg/dL (ref 1.7–2.4)

## 2019-07-28 LAB — POTASSIUM: Potassium: 3.7 mmol/L (ref 3.5–5.1)

## 2019-07-28 MED ORDER — METHOCARBAMOL 500 MG PO TABS: 500.0000 mg | ORAL_TABLET | Freq: Every day | ORAL | Status: DC | PRN

## 2019-07-28 NOTE — Plan of Care (Signed)
  Problem: Activity: Goal: Risk for activity intolerance will decrease Outcome: Completed/Met   Problem: Nutrition: Goal: Adequate nutrition will be maintained Outcome: Completed/Met   Problem: Coping: Goal: Level of anxiety will decrease Outcome: Completed/Met   Problem: Elimination: Goal: Will not experience complications related to bowel motility Outcome: Completed/Met Goal: Will not experience complications related to urinary retention Outcome: Completed/Met   Problem: Pain Managment: Goal: General experience of comfort will improve Outcome: Completed/Met   Problem: Safety: Goal: Ability to remain free from injury will improve Outcome: Completed/Met   Problem: Skin Integrity: Goal: Risk for impaired skin integrity will decrease Outcome: Completed/Met

## 2019-07-28 NOTE — Progress Notes (Addendum)
Brief History:  Zylan Almquist. Tullis is a 64 year old male with a past medical history significant for insulin-dependent diabetes mellitus type II, s/p liver transplant (07/2011) for hepatocellular carcinoma, hypertension, chronic kidney disease stage III, and chronic diastolic heart failure who presented to the emergency department with progressive shortness of breath and lower extremity edema.  Subjective:  Mr. Platner was evaluated and examined this morning bedside. No acute overnight events. He states that he is doing much better. Patient still reports orthopnea, but states that it has significantly improved.  Objective:  Vital signs in last 24 hours: Vitals:   07/28/19 0116 07/28/19 0119 07/28/19 0500 07/28/19 0500  BP: 130/62   132/65  Pulse: 90   88  Resp: 18   18  Temp: (!) 97.5 F (36.4 C)   97.6 F (36.4 C)  TempSrc: Oral   Oral  SpO2: 94%   96%  Weight:  98.9 kg 98.5 kg   Height:       Weight change: -1.089 kg  Intake/Output Summary (Last 24 hours) at 07/28/2019 0653 Last data filed at 07/28/2019 0600 Gross per 24 hour  Intake 1224 ml  Output 3500 ml  Net -2276 ml   Physical Exam Constitutional:      General: He is not in acute distress. HENT:     Head: Normocephalic and atraumatic.     Mouth/Throat:     Mouth: Mucous membranes are moist.  Eyes:     Extraocular Movements: Extraocular movements intact.  Cardiovascular:     Rate and Rhythm: Normal rate and regular rhythm.     Heart sounds: Murmur heard.  Systolic murmur is present with a grade of 2/6.  No friction rub. No gallop.   Pulmonary:     Effort: Pulmonary effort is normal. No respiratory distress.     Breath sounds: No stridor. Examination of the right-lower field reveals decreased breath sounds. Decreased breath sounds present. No wheezing or rales.  Abdominal:     General: Bowel sounds are normal.     Tenderness: There is no abdominal tenderness.  Musculoskeletal:        General: Normal range  of motion.     Right lower leg: 3+ Pitting Edema present.     Left lower leg: 3+ Pitting Edema present.  Skin:    General: Skin is warm and dry.  Neurological:     Mental Status: He is alert and oriented to person, place, and time.  Psychiatric:        Mood and Affect: Mood and affect normal.    Assessment/Plan:  Principal Problem:   Heart failure, diastolic, acute on chronic (HCC) Active Problems:   Type 2 diabetes mellitus ([place])   History of liver transplant ([place])   Hypokalemia  Freeman Caldron. Currier is a 64 year old male with a past medical history significant for insulin-dependent diabetes mellitus type II, s/p liver transplant (07/2011) for hepatocellular carcinoma, hypertension, chronic kidney disease stage III, and chronic diastolic heart failure who presented to the emergency department with progressive shortness of breath and lower extremity edema. With a chest xray illustrating pulmonary vascular congestion and a small right pleural effusion.  # Acute on Chronic Diastolic Heart Failure Patient with bilateral lower extremity edema and shortness of breath likely secondary to chronic diastolic heart failure. He is on bumetanide and metolazone at home. Initially started on IV lasix 80 mg BID for diuresis. Urine output was not adequate so increase IV lasix to 120 mg BID. Added  Metolazone 2.5 mg once a daily in the morning. Per chart, patient had a urine output volume of 2.5 L yesterday. Net I/O yesterday was - 2276 mL. Patient reports improvement in orthopnea and his volume status appears to be improving. BUN and creatinine have remained stable since admission. Plan: - Continue IV Lasix 120 mg BID. - Continue metolazone 2.5 mg PO once daily in the morning to increase diuresis. - Continue fluid restricted diet (1500 mL). - The patient was hypokalemic on admission and is getting diuresed. Therefore, electrolytes will need to be closely monitored. Additionally, with the patient having a  history of CKD receiving aggressive diuresis the kidney function will need to be monitor closely via BUN and creatinine. Will get routine basic metabolic panels. - Daily weights and strict I/Os to monitor fluid status as we continue to diurese patient.  # Hypokalemia This is most likely induced from diuretic therapy as patient is on metolazone and bumetanide at home and is currently receiving IV Lasix. Patient's potassium was 3.2 yesterday and received potassium chloride 40 mEq PO TID. Potassium this morning is 3.5. Plan: - Give supplemental potassium chloride 40 mEq TID today. - Potassium will need to be closely monitored as patient has had chronically low potassium and patient is receiving potassium-wasting diuretic therapy. Will obtain routine basic metabolic panels.  # Chronic Kidney Disease stage III - The patient's BUN and creatinine is currently at baseline but will need to be monitored with diuresis. Will obtain routine basic metabolic panels. May need outpatient nephrology follow up.  # Hypertension - Patient has chronic history of hypertension that has been managed outpatient with hydralazine. Continue home Hydralazine 25 BID.  # s/p Liver Transplant 2013 # Hepatocellular Carcinoma - Patient diagnosed with Hepatocellular Carcinoma that embolized in 2012. Underwent liver transplant in 2013. Continue home tacrolimus 0.5 mg PO 1 capsule in the am and 1 capsule every other night. Continue home mycophenolate 750 mg PO BID.  # Insulin-dependent Diabetes mellitus type II - Patient's glycemia has been well controlled with inpatient medication regimen. CBG this morning was 97. Will continue Lantus 75 units qhs + SSI-R + HS scale. - Routine CBG monitoring.  Code status: Full code Diet: Fluid restricted: 1500 mL VTE prophylaxis: Lovenox   Dispo: Pending improvement in volume status.    LOS: 4 days   Nanetta Batty, Medical Student 07/28/2019, 6:53 AM   Attestation for  Student Documentation:  I personally was present and performed or re-performed the history, physical exam and medical decision-making activities of this service and have verified that the service and findings are accurately documented in the student's note.  Mical Brun A, DO 07/28/2019, 10:52 AM Pager [phone] On call pager after 5pm on weekdays and 1pm on weekends: [phone]

## 2019-07-29 LAB — BASIC METABOLIC PANEL
Anion gap: 15 (ref 5–15)
BUN: 77 mg/dL — ABNORMAL HIGH (ref 8–23)
CO2: 27 mmol/L (ref 22–32)
Calcium: 9.5 mg/dL (ref 8.9–10.3)
Chloride: 98 mmol/L (ref 98–111)
Creatinine, Ser: 1.98 mg/dL — ABNORMAL HIGH (ref 0.61–1.24)
GFR calc Af Amer: 40 mL/min — ABNORMAL LOW (ref >60)
GFR calc non Af Amer: 34 mL/min — ABNORMAL LOW (ref >60)
Glucose, Bld: 89 mg/dL (ref 70–99)
Potassium: 3.2 mmol/L — ABNORMAL LOW (ref 3.5–5.1)
Sodium: 140 mmol/L (ref 135–145)

## 2019-07-29 LAB — GLUCOSE, CAPILLARY
Glucose-Capillary: 90 mg/dL (ref 70–99)
Glucose-Capillary: 115 mg/dL — ABNORMAL HIGH (ref 70–99)
Glucose-Capillary: 99 mg/dL (ref 70–99)
Glucose-Capillary: 124 mg/dL — ABNORMAL HIGH (ref 70–99)

## 2019-07-29 LAB — MAGNESIUM: Magnesium: 2.4 mg/dL (ref 1.7–2.4)

## 2019-07-29 MED ORDER — POTASSIUM CHLORIDE CRYS ER 20 MEQ PO TBCR
40.0000 meq | EXTENDED_RELEASE_TABLET | Freq: Four times a day (QID) | ORAL | Status: DC
  Administered 2019-07-29 – 2019-07-31 (×10): 40 meq via ORAL
  Filled 2019-07-29 (×10): qty 2

## 2019-07-29 MED ORDER — POTASSIUM CHLORIDE 10 MEQ/50ML IV SOLN: 10.0000 meq | INTRAVENOUS | Status: DC

## 2019-07-29 NOTE — Progress Notes (Signed)
Internal Medicine Attending:   I saw and examined the patient. I reviewed the resident's note and I agree with the resident's findings and plan as documented in the resident's note.  Patient's shortness of breath is improving but he still has some mild orthopnea.  Patient was concerned about his potassium levels and we discussed this with him today.  Patient was initially admitted to the hospital with acute on chronic diastolic heart failure exacerbation.  Patient was net negative approximately 2.7 L over the last 24 hours.  Patient's overall volume status appears to be improving but he still has 1-2+ lower extremity pitting edema.  We will continue with IV Lasix 120 mg twice daily as well as metolazone 2.5 mg daily for now.  We will supplement potassium as needed. Continue daily weights and strict I's and O's.  Patient's creatinine has remained stable.  We will continue to monitor his BMP closely.  Patient with likely CKD stage IIIb and should follow up with nephrology as an outpatient.  No further work-up at this time.

## 2019-07-29 NOTE — Plan of Care (Signed)
°  Problem: Education: °Goal: Ability to demonstrate management of disease process will improve °Outcome: Progressing °Goal: Ability to verbalize understanding of medication therapies will improve °Outcome: Progressing °Goal: Individualized Educational Video(s) °Outcome: Progressing °  °

## 2019-07-29 NOTE — Plan of Care (Signed)
  Problem: Clinical Measurements: Goal: Will remain free from infection Outcome: Completed/Met Goal: Respiratory complications will improve Outcome: Completed/Met

## 2019-07-29 NOTE — Progress Notes (Signed)
Brief History:  Gregory Tanner is a 64 year old male with a past medical history significant for insulin-dependent diabetes mellitus type II, s/p liver transplant (07/2011) for hepatocellular carcinoma, hypertension, chronic kidney disease stage III, and chronic diastolic heart failure who presented to the emergency department with progressive shortness of breath and lower extremity edema.  Subjective:  Patient states that his breathing has improved, but he still has mild SHOB when laying down. He is very concerned about his potassium and has a difficult time understanding how often we are giving the potassium. Otherwise, he denies new symptoms. All questions were thoroughly answered.   Objective:  Vital signs in last 24 hours: Vitals:   07/29/19 0100 07/29/19 0419 07/29/19 0916 07/29/19 0917  BP: 118/71 116/68 137/63 137/63  Pulse: 88 89 92 95  Resp: 18 18  18   Temp: 97.7 F (36.5 C) (!) 97.4 F (36.3 C) 97.9 F (36.6 C) 97.8 F (36.6 C)  TempSrc: Oral Oral Oral Oral  SpO2: 91% 92% 95% 96%  Weight:  98.2 kg    Height:       Weight change: -0.726 kg  Intake/Output Summary (Last 24 hours) at 07/29/2019 1106 Last data filed at 07/29/2019 0600 Gross per 24 hour  Intake 762 ml  Output 3450 ml  Net -2688 ml   Physical Exam Vitals and nursing note reviewed.  Constitutional:      General: He is not in acute distress. HENT:     Head: Normocephalic and atraumatic.  Cardiovascular:     Rate and Rhythm: Normal rate and regular rhythm.     Comments: 1+ LE edema Pulmonary:     Effort: Pulmonary effort is normal.     Breath sounds: Rales (basilar) present.  Abdominal:     General: Bowel sounds are normal.     Tenderness: There is no abdominal tenderness.  Skin:    General: Skin is warm and dry.  Neurological:     General: No focal deficit present.     Mental Status: He is alert and oriented to person, place, and time.    Assessment/Plan:  Principal Problem:   Heart  failure, diastolic, acute on chronic (HCC) Active Problems:   Type 2 diabetes mellitus ([place])   History of liver transplant ([place])   Hypokalemia  Gregory Tanner is a 64 year old male with a past medical history significant for insulin-dependent diabetes mellitus type II, s/p liver transplant (07/2011) for hepatocellular carcinoma, hypertension, chronic kidney disease stage III, and chronic diastolic heart failure who presented to the emergency department with progressive shortness of breath and lower extremity edema. With a chest xray illustrating pulmonary vascular congestion and a small right pleural effusion.    # Acute on Chronic Diastolic Heart Failure # Hypokalemia Patient with bilateral lower extremity edema and shortness of breath likely secondary to chronic diastolic heart failure. He is on bumetanide and metolazone at home. Initially started on IV lasix 80 mg BID for diuresis. Urine output was not adequate so increased IV lasix to 120 mg BID. Added Metolazone 2.5 mg once a daily in the morning. Out 2.7 L in the last 24 hours. Patient reports improvement in orthopnea and his volume status appears to be improving. BUN and creatinine have remained stable since admission. K 3.2 this AM.   Plan: - Continue IV Lasix 120 mg BID. - Continue metolazone 2.5 mg PO once daily in the morning to increase diuresis. - Continue fluid restricted diet (1500 mL). - increase  K to 40 QID.  - Daily weights and strict I/Os to monitor fluid status as we continue to diurese patient. - daily BMP  # Chronic Kidney Disease stage III - The patient's BUN and creatinine is currently at baseline but will need to be monitored with diuresis. Will obtain routine basic metabolic panels. May need outpatient nephrology follow up.  # Hypertension - Patient has chronic history of hypertension that has been managed outpatient with hydralazine. Continue home Hydralazine 25 BID.  # s/p Liver Transplant 2013 #  Hepatocellular Carcinoma - Patient diagnosed with Hepatocellular Carcinoma that embolized in 2012. Underwent liver transplant in 2013. Continue home tacrolimus 0.5 mg PO 1 capsule in the am and 1 capsule every other night. Continue home mycophenolate 750 mg PO BID.  # Insulin-dependent Diabetes mellitus type II - Patient's glycemia has been well controlled with inpatient medication regimen. CBG this morning was 90. Will continue Lantus 75 units qhs + SSI-R + HS scale. - Routine CBG monitoring.  Code status: Full code Diet: Fluid restricted: 1500 mL VTE prophylaxis: Lovenox   Dispo: Pending improvement in volume status.    LOS: 5 days   Al Decant, MD 07/29/2019, 11:06 AM   Attestation for Student Documentation:  I personally was present and performed or re-performed the history, physical exam and medical decision-making activities of this service and have verified that the service and findings are accurately documented in the student's note.  Al Decant, MD 07/29/2019, 11:06 AM Pager [phone] On call pager after 5pm on weekdays and 1pm on weekends: [phone]

## 2019-07-30 LAB — BASIC METABOLIC PANEL
Anion gap: 15 (ref 5–15)
BUN: 84 mg/dL — ABNORMAL HIGH (ref 8–23)
CO2: 27 mmol/L (ref 22–32)
Calcium: 9.6 mg/dL (ref 8.9–10.3)
Chloride: 99 mmol/L (ref 98–111)
Creatinine, Ser: 2.07 mg/dL — ABNORMAL HIGH (ref 0.61–1.24)
GFR calc Af Amer: 38 mL/min — ABNORMAL LOW (ref >60)
GFR calc non Af Amer: 33 mL/min — ABNORMAL LOW (ref >60)
Glucose, Bld: 89 mg/dL (ref 70–99)
Potassium: 3.3 mmol/L — ABNORMAL LOW (ref 3.5–5.1)
Sodium: 141 mmol/L (ref 135–145)

## 2019-07-30 LAB — GLUCOSE, CAPILLARY
Glucose-Capillary: 100 mg/dL — ABNORMAL HIGH (ref 70–99)
Glucose-Capillary: 129 mg/dL — ABNORMAL HIGH (ref 70–99)
Glucose-Capillary: 117 mg/dL — ABNORMAL HIGH (ref 70–99)
Glucose-Capillary: 127 mg/dL — ABNORMAL HIGH (ref 70–99)

## 2019-07-30 LAB — MAGNESIUM: Magnesium: 2.5 mg/dL — ABNORMAL HIGH (ref 1.7–2.4)

## 2019-07-30 MED ORDER — BUMETANIDE 2 MG PO TABS
2.0000 mg | ORAL_TABLET | Freq: Two times a day (BID) | ORAL | Status: DC
  Administered 2019-07-30 – 2019-07-31 (×2): 2 mg via ORAL
  Filled 2019-07-30 (×3): qty 1

## 2019-07-30 MED ORDER — POTASSIUM CHLORIDE 10 MEQ/100ML IV SOLN
10.0000 meq | INTRAVENOUS | Status: AC
  Administered 2019-07-30 (×2): 10 meq via INTRAVENOUS
  Filled 2019-07-30 (×2): qty 100

## 2019-07-30 NOTE — Care Management Important Message (Signed)
Important Message  Patient Details  Name: Gregory Tanner MRN: [id number] Date of Birth: 1955-10-03   Medicare Important Message Given:  Yes     [name] 07/30/2019, 8:54 AM

## 2019-07-30 NOTE — Progress Notes (Signed)
Brief History:  Gregory Tanner is a 64 year old male with a past medical history significant for insulin-dependent diabetes mellitus type II, s/p liver transplant (07/2011) for hepatocellular carcinoma, hypertension, chronic kidney disease stage III, and chronic diastolic heart failure who presented to the emergency department with progressive shortness of breath and lower extremity edema.  Subjective:  Gregory Tanner was evaluated and examined this morning bedside. No acute overnight events. Patient was sitting upright in a chair beside the bed. He reports that he is feeling better today. He says that his shortness of breath has improved. He had some difficulty sleeping last night, and attributes this to the bed position causing some low back discomfort.  We discussed with Gregory Tanner that we would be switching him back to bumetanide 2 mg BID PO daily and metolazone 2.5 mg twice weekly.  Objective:  Vital signs in last 24 hours: Vitals:   07/29/19 0917 07/29/19 1133 07/29/19 1925 07/30/19 0423  BP: 137/63 139/65 (!) 136/59 (!) 125/56  Pulse: 95 90 90 85  Resp: 18 18 18 18   Temp: 97.8 F (36.6 C) 97.9 F (36.6 C)  (!) 97.5 F (36.4 C)  TempSrc: Oral Oral  Oral  SpO2: 96% 90% 93% 95%  Weight:    97.3 kg  Height:       Weight change: -0.907 kg  Intake/Output Summary (Last 24 hours) at 07/30/2019 8413 Last data filed at 07/29/2019 2250 Gross per 24 hour  Intake 810 ml  Output 2425 ml  Net -1615 ml   Physical Exam Constitutional:      General: He is not in acute distress. HENT:     Head: Normocephalic and atraumatic.     Mouth/Throat:     Mouth: Mucous membranes are moist.  Eyes:     Extraocular Movements: Extraocular movements intact.  Cardiovascular:     Rate and Rhythm: Normal rate and regular rhythm.     Heart sounds: Murmur heard.  Systolic murmur is present with a grade of 2/6.  No friction rub. No gallop.   Pulmonary:     Effort: Pulmonary effort is normal.  No respiratory distress.     Breath sounds: No stridor. No wheezing or rales.  Abdominal:     General: Bowel sounds are normal.     Tenderness: There is no abdominal tenderness.  Musculoskeletal:        General: Normal range of motion.     Right lower leg: 2+ Pitting Edema present.     Left lower leg: 2+ Pitting Edema present.  Skin:    General: Skin is warm and dry.  Neurological:     Mental Status: He is alert and oriented to person, place, and time.  Psychiatric:        Mood and Affect: Mood and affect normal.    Assessment/Plan:  Principal Problem:   Heart failure, diastolic, acute on chronic (HCC) Active Problems:   Type 2 diabetes mellitus ([place])   History of liver transplant ([place])   Hypokalemia  Gregory Tanner is a 64 year old male with a past medical history significant for insulin-dependent diabetes mellitus type II, s/p liver transplant (07/2011) for hepatocellular carcinoma, hypertension, chronic kidney disease stage III, and chronic diastolic heart failure who presented to the emergency department with progressive shortness of breath and lower extremity edema. With a chest xray illustrating pulmonary vascular congestion and a small right pleural effusion.  # Acute on Chronic Diastolic Heart Failure Patient with bilateral lower extremity  edema and shortness of breath likely secondary to chronic diastolic heart failure. He is on bumetanide and metolazone at home. Diuresing with IV lasix to 120 mg BID, and Metolazone 2.5 mg once a daily in the morning. Per chart, patient had a urine output volume of 2.4 L yesterday. Net I/O yesterday was - 1.6 L. Patient is progressing well and is down - 9 L since admission, with improvement in shortness of breath, orthopnea, and lower extremity edema. Will switch to home diuretics and monitor patient's progression today with goals towards discharge tomorrow. Plan: - Switching to bumetanide 2 mg BID PO to see how patient diuresis with PO  medications. - Switch metolazone 2.5 mg PO to twice weekly. Was originally taking once weekly at home, titrated up as patient is still volume overloaded. - Continue fluid restricted diet (1500 mL). - The patient was hypokalemic on admission and is getting diuresed. Therefore, electrolytes will need to be closely monitored. Additionally, with the patient having a history of CKD receiving aggressive diuresis the kidney function will need to be monitor closely via BUN and creatinine. Will get routine basic metabolic panels. - Daily weights and strict I/Os to monitor fluid status as we continue to diurese patient.  # Hypokalemia This is most likely induced from diuretic therapy as patient is on metolazone and bumetanide at home and is currently receiving IV Lasix. Patient's potassium was 3.2 yesterday and received potassium chloride 40 mEq PO TID. Potassium this morning is 3.3. Plan: - Give to supplement potassium. Give potassium chloride 40 mEq QID PO and IV potassium chloride 10 mEq BID. - Potassium will need to be closely monitored as patient has had chronically low potassium and patient is receiving potassium-wasting diuretic therapy. Will obtain routine basic metabolic panels.  # Chronic Kidney Disease stage III - The patient's BUN and creatinine is currently at baseline but will need to be monitored with diuresis. Will obtain routine basic metabolic panels. Patient does not see a nephrologist outpatient. Will need to establish care with Nephrologist following discharge.  # Hypertension - Patient has chronic history of hypertension that has been managed outpatient with hydralazine. Continue home Hydralazine 25 BID.  # s/p Liver Transplant 2013 # Hepatocellular Carcinoma - Patient diagnosed with Hepatocellular Carcinoma that embolized in 2012. Underwent liver transplant in 2013. Continue home tacrolimus 0.5 mg PO 1 capsule in the am and 1 capsule every other night. Continue home mycophenolate  750 mg PO BID.  # Insulin-dependent Diabetes mellitus type II - Patient's glycemia has been well controlled with inpatient medication regimen. CBG this morning was 100. Will continue Lantus 75 units qhs + SSI-R + HS scale. - Routine CBG monitoring.  Code status: Full code Diet: Fluid restricted: 1500 mL VTE prophylaxis: Lovenox   Dispo: Discharge home in 1-2 days.   LOS: 6 days   Nanetta Batty, Medical Student 07/30/2019, 6:26 AM  Pager [phone] On call pager after 5pm on weekdays and 1pm on weekends: [phone]

## 2019-07-31 LAB — BASIC METABOLIC PANEL
Anion gap: 14 (ref 5–15)
BUN: 83 mg/dL — ABNORMAL HIGH (ref 8–23)
CO2: 29 mmol/L (ref 22–32)
Calcium: 9.5 mg/dL (ref 8.9–10.3)
Chloride: 98 mmol/L (ref 98–111)
Creatinine, Ser: 2 mg/dL — ABNORMAL HIGH (ref 0.61–1.24)
GFR calc Af Amer: 39 mL/min — ABNORMAL LOW (ref >60)
GFR calc non Af Amer: 34 mL/min — ABNORMAL LOW (ref >60)
Glucose, Bld: 175 mg/dL — ABNORMAL HIGH (ref 70–99)
Potassium: 3.4 mmol/L — ABNORMAL LOW (ref 3.5–5.1)
Sodium: 141 mmol/L (ref 135–145)

## 2019-07-31 LAB — GLUCOSE, CAPILLARY
Glucose-Capillary: 84 mg/dL (ref 70–99)
Glucose-Capillary: 101 mg/dL — ABNORMAL HIGH (ref 70–99)

## 2019-07-31 LAB — CREATININE, SERUM
Creatinine, Ser: 2.01 mg/dL — ABNORMAL HIGH (ref 0.61–1.24)
GFR calc Af Amer: 39 mL/min — ABNORMAL LOW (ref >60)
GFR calc non Af Amer: 34 mL/min — ABNORMAL LOW (ref >60)

## 2019-07-31 MED ORDER — POTASSIUM CHLORIDE CRYS ER 20 MEQ PO TBCR: 40.0000 meq | EXTENDED_RELEASE_TABLET | Freq: Two times a day (BID) | ORAL | 0 refills | Status: DC

## 2019-07-31 MED ORDER — METOLAZONE 5 MG PO TABS: 5.0000 mg | ORAL_TABLET | ORAL | 0 refills | Status: DC

## 2019-07-31 NOTE — Progress Notes (Signed)
Brief History:  Gregory Tanner is a 64 year old male with a past medical history significant for insulin-dependent diabetes mellitus type II, s/p liver transplant (07/2011) for hepatocellular carcinoma, hypertension, chronic kidney disease stage III, and chronic diastolic heart failure who presented to the emergency department with progressive shortness of breath and lower extremity edema.  Subjective:  Gregory Tanner was evaluated and examined this morning bedside. No acute overnight events. Patient was sitting upright in a chair beside the bed. He states that he was able to sleep well last night. Patient denies orthopnea.  We discussed with the patient the changes in his home diuretics dosing and increasing home potassium supplementation. We also discussed the need for the patient to follow up with his PCP within the next few weeks after he has been discharged. Additionally, he will need to be seen by an Nephrologist which we discussed with the patient can be arranged by his PCP.  Objective:  Vital signs in last 24 hours: Vitals:   07/30/19 0423 07/30/19 1059 07/30/19 1941 07/31/19 0618  BP: (!) 125/56 (!) 122/57 124/61 122/66  Pulse: 85 87 88 87  Resp: 18 20 17 17   Temp: (!) 97.5 F (36.4 C) (!) 97.5 F (36.4 C) (!) 97.5 F (36.4 C) (!) 97.3 F (36.3 C)  TempSrc: Oral Oral Oral Oral  SpO2: 95% 96% 96% 96%  Weight: 97.3 kg   96.5 kg  Height:       Weight change: -0.726 kg  Intake/Output Summary (Last 24 hours) at 07/31/2019 1044 Last data filed at 07/31/2019 1020 Gross per 24 hour  Intake 240 ml  Output 4275 ml  Net -4035 ml   Physical Exam Constitutional:      General: He is not in acute distress. HENT:     Head: Normocephalic and atraumatic.     Mouth/Throat:     Mouth: Mucous membranes are moist.  Eyes:     Extraocular Movements: Extraocular movements intact.  Cardiovascular:     Rate and Rhythm: Normal rate and regular rhythm.     Heart sounds: Murmur heard.   Systolic murmur is present with a grade of 2/6.  No friction rub. No gallop.   Pulmonary:     Effort: Pulmonary effort is normal. No respiratory distress.     Breath sounds: No stridor. No wheezing or rales.  Abdominal:     General: Bowel sounds are normal.     Tenderness: There is no abdominal tenderness.  Musculoskeletal:        General: Normal range of motion.     Right lower leg: 1+ Pitting Edema present.     Left lower leg: 1+ Pitting Edema present.  Skin:    General: Skin is warm and dry.  Neurological:     Mental Status: He is alert and oriented to person, place, and time.  Psychiatric:        Mood and Affect: Mood and affect normal.    Assessment/Plan:  Principal Problem:   Heart failure, diastolic, acute on chronic (HCC) Active Problems:   Type 2 diabetes mellitus ([place])   History of liver transplant ([place])   Hypokalemia  Gregory Tanner is a 64 year old male with a past medical history significant for insulin-dependent diabetes mellitus type II, s/p liver transplant (07/2011) for hepatocellular carcinoma, hypertension, chronic kidney disease stage III, and chronic diastolic heart failure who presented to the emergency department with progressive shortness of breath and lower extremity edema. With a chest xray  illustrating pulmonary vascular congestion and a small right pleural effusion.  # Acute on Chronic Diastolic Heart Failure Patient with bilateral lower extremity edema and shortness of breath likely secondary to chronic diastolic heart failure. He is on bumetanide and metolazone at home. Switched patient from IV Lasix to home Bumetanide and Metolazone. Per chart, patient had a urine output volume of 4.3 L yesterday. Net I/O yesterday was - 4.0 L. Patient is progressing well and is down - 13.6 L since admission, with improvement in shortness of breath and lower extremity edema. Patient no longer endorsing orthopnea. Goal is discharge home today. Plan: - Discharge home  today. - Continuing bumetanide 2 mg BID PO. Will discharge home with this dose. - Switch metolazone 2.5 mg PO to twice weekly. Was originally taking once weekly at home, patient will be discharged home on with this dose and schedule. - Patient will need to follow up with PCP outpatient to assess volume and electrolyte status. - Continue fluid restricted diet (1500 mL). - The patient was hypokalemic on admission and is getting diuresed. Therefore, electrolytes will need to be closely monitored. Additionally, with the patient having a history of CKD receiving aggressive diuresis the kidney function will need to be monitor closely via BUN and creatinine. Will get routine basic metabolic panels. - Daily weights and strict I/Os to monitor fluid status as we continue to diurese patient.  # Hypokalemia This is most likely induced from diuretic therapy as patient is on metolazone and bumetanide at home and is currently receiving IV Lasix. Patient's potassium was 3.3 yesterday and received potassium chloride 40 mEq PO TID. Potassium this morning is 3.4. Patient taking 20 mEq potassium chloride BID PO at home. Potassium 3.0 on admission. Plan: - Discharge with 40 mEq potassium chloride BID PO. With patient having potassium of 3.0 on admission patient was not receiving enough supplemental potassium at home. - Potassium will need to be closely monitored as patient has had chronically low potassium and patient is receiving potassium-wasting diuretic therapy. Will need to follow up within the next week with PCP to assess electrolyte status.  # Chronic Kidney Disease stage III - The patient's BUN and creatinine is currently at baseline but will need to be monitored with diuresis. Will obtain routine basic metabolic panels. Patient does not see a nephrologist outpatient. Will need to establish care with Nephrologist following discharge.  # Hypertension - Patient has chronic history of hypertension that has been  managed outpatient with hydralazine. Continue home Hydralazine 25 BID.  # s/p Liver Transplant 2013 # Hepatocellular Carcinoma - Patient diagnosed with Hepatocellular Carcinoma that embolized in 2012. Underwent liver transplant in 2013. Continue home tacrolimus 0.5 mg PO 1 capsule in the am and 1 capsule every other night. Continue home mycophenolate 750 mg PO BID.  # Insulin-dependent Diabetes mellitus type II - Patient's glycemia has been well controlled with inpatient medication regimen. CBG this morning was 100. Will continue Lantus 75 units qhs + SSI-R + HS scale. - Routine CBG monitoring.  Code status: Full code Diet: Fluid restricted: 1500 mL VTE prophylaxis: Lovenox   Dispo: Discharge home today.   LOS: 7 days   Nanetta Batty, Medical Student 07/31/2019, 10:44 AM  Pager [phone] On call pager after 5pm on weekdays and 1pm on weekends: [phone]

## 2019-07-31 NOTE — Discharge Summary (Signed)
Name: Gregory Tanner MRN: [id number] DOB: [birth year] 64 y.o. PCP: Ronita Hipps, MD  Date of Admission: 07/24/2019 11:16 AM Date of Discharge: 07/31/2019 Attending Physician: Aldine Contes MD Discharge Diagnosis: 1. Acute on Chronic Diastolic Heart Failure 2. Hypokalemia 3. Chronic Kidney Disease stage III 4. Hypertension  5. Hepatocellular Carcinoma  6. Insulin-Dependent Diabetes Mellitis Type II   Discharge Medications: Allergies as of 07/31/2019      Reactions   Iodinated Diagnostic Agents Hives, Other (See Comments)   Allergy is not to all contrast - but patient is unsure of which particular one his allergy is to. Allergy is not to all contrast - but patient is unsure of which particular one his allergy is to. Contrast dye used before liver transplant cause hives, not sure which dye this was but is able to use others   Latex Other (See Comments)   Skin peeling   Metformin And Related Nausea And Vomiting   Rosiglitazone Nausea And Vomiting   GI effects and abdominal pain      Medication List    TAKE these medications   allopurinol 300 MG tablet Commonly known as: ZYLOPRIM Take 300 mg by mouth daily.   aspirin 81 MG tablet Take 81 mg by mouth daily.   bumetanide 2 MG tablet Commonly known as: Bumex Take 1 tablet (2 mg total) by mouth 2 (two) times daily.   gemfibrozil 600 MG tablet Commonly known as: LOPID TAKE 1/2 TABLET BY MOUTH TWICE (2) DAILY BEFORE A MEAL What changed: See the new instructions.   HumuLIN R U-500 KwikPen 500 UNIT/ML kwikpen Generic drug: insulin regular human CONCENTRATED Inject 50-110 Units into the skin 3 (three) times daily with meals. Inject 105 units with breakfast, 50-60 units with midday meal depending on the meal, and 100 units with dinner What changed: Another medication with the same name was removed. Continue taking this medication, and follow the directions you see here.   hydrALAZINE 25 MG tablet Commonly known as:  APRESOLINE Take 12.5-25 mg by mouth in the morning and at bedtime.   metolazone 5 MG tablet Commonly known as: ZAROXOLYN Take 1 tablet (5 mg total) by mouth 2 (two) times a week. What changed: See the new instructions.   multivitamin tablet Take 1 tablet by mouth daily.   mycophenolate 250 MG capsule Commonly known as: CELLCEPT Take 750 mg by mouth 2 (two) times daily.   potassium chloride SA 20 MEQ tablet Commonly known as: KLOR-CON Take 2 tablets (40 mEq total) by mouth in the morning and at bedtime. What changed:   how much to take  when to take this   tacrolimus 0.5 MG capsule Commonly known as: PROGRAF Take 0.5 capsules by mouth See admin instructions. Takes 1 capsule in the am, and 1 capsule at night every other night       Disposition and follow-up:   Gregory Tanner was discharged from [facility] in Stable condition.  At the hospital follow up visit please address:  1. Acute on Chronic Diastolic Heart Failure - Optimization of his diuretics, compliance with new dosing of metolazone  2. Hypokalemia - Monitor potassium, supplement as needed   3. Chronic Kidney Disease stage III - Monitor kidney function or consider referral to nephrology   2.  Labs / imaging needed at time of follow-up: BMP  3.  Pending labs/ test needing follow-up: None  Follow-up Appointments:  Follow-up Information    Ronita Hipps, MD. Schedule an appointment  as soon as possible for a visit in 1 week(s).   Specialty: Family Medicine Why: Please make an appointment with your PCP for follow up on your potassium and to discuss referral to nephrology.  Contact information: [address],[place] [address] [phone]        Richardo Priest, MD.   Specialty: Cardiology Contact information: [place] [phone] [phone]        Ronita Hipps, MD. Go on 08/12/2019.   Specialty: Family Medicine Why: @11 :00am Contact  information: [place] by problem list: 1. Acute on Chronic Diastolic Heart Failure Patient, with a past medical history of diastolic heart failure, arrived with bilateral lower extremity edema, dyspnea, orthopnea, and endorsed 10-15 pounds. On physical examination he had 3+ bilateral lower pitting edema and a BNP of 292 and was admitted for an acute on chronic diastolic heart failure exacerbation. He was started on IV lasix 80 mg and potassium replenishment due to his hypokalemia, with fluid restrictions and his in and outs were monitored. His IV lasix was increased to 120 mg twice daily and metolazone 2.5mg  daily. Ultimately patient diuresed -13.6 liters during his hospital course with a dry weight of 96.5 kg. His shortness of breath and orthopnea resolved. He was discharged home with an increase in metolazone 5 mg two times weekly, his home dose of 2 mg two times daily, and an increase in his home potassium supplementation to 40 mEq two times daily.   2. Hypokalemia Patient with a history of diastolic heart failure, on home diuretics, presents to the emergency department with a potassium of 3.0. He was diuresed with IV lasix during his hospital course and his potassium was monitored closely with supplementation. He was discharged with a potassium of 3.4 and his home potassium supplementation was increased to 40 mEq two times daily.   3. Chronic Kidney Disease stage III Patient presented with chronic kidney disease stage III. His kidney function was monitored during his hospital course was discharged with a creatinine of 2.00 and a GFR of 34. He was discharged in stable condition with instructions to establish with nephrology.   4. Hypertension  Patient presented with a history of hypertension. Patient managed on his home dose of hydralazine 25 two times daily. He was discharged in stable condition on his home  medications.   5. Hepatocellular Carcinoma Status Post Liver Transplant in 2013 Patient diagnosed with Hepatocellular Carcinoma that embolized in 2012. Underwent liver transplant in 2013. He was continued on his home dose of tacrolimus 0.5 mg 1 capsule in the am and 1 capsule every other night, and his home mycophenolate 750 mg two times daily. Patient discharged in stable condition.  6. Insulin-Dependent Diabetes Mellitis Type II  Patient with history of Type II diabetes presented to the emergency department of an acute on chronic diastolic congestive heart failure exacerbation. He was managed with sliding scale insulin and 75 units of Lantus daily. He was discharged in stable condition on his home medications.   Discharge Vitals:   BP 128/62 (BP Location: Right Arm)   Pulse 89   Temp 98.2 F (36.8 C) (Oral)   Resp 18   Ht 5\' 10"  (1.778 m)   Wt 96.5 kg   SpO2 95%   BMI 30.53 kg/m   Pertinent Labs, Studies, and Procedures:  CMP Latest Ref Rng & Units 07/31/2019  07/31/2019 07/30/2019  Glucose 70 - 99 mg/dL 175(H) - 89  BUN 8 - 23 mg/dL 83(H) - 84(H)  Creatinine 0.61 - 1.24 mg/dL 2.00(H) 2.01(H) 2.07(H)  Sodium 135 - 145 mmol/L 141 - 141  Potassium 3.5 - 5.1 mmol/L 3.4(L) - 3.3(L)  Chloride 98 - 111 mmol/L 98 - 99  CO2 22 - 32 mmol/L 29 - 27  Calcium 8.9 - 10.3 mg/dL 9.5 - 9.6  Total Protein 6.5 - 8.1 g/dL - - -  Total Bilirubin 0.3 - 1.2 mg/dL - - -  Alkaline Phos 38 - 126 U/L - - -  AST 15 - 41 U/L - - -  ALT 0 - 44 U/L - - -   CBC Latest Ref Rng & Units 07/28/2019 07/27/2019 07/25/2019  WBC 4.0 - 10.5 K/uL 5.2 5.9 6.7  Hemoglobin 13.0 - 17.0 g/dL 14.5 14.3 14.7  Hematocrit 39 - 52 % 46.7 45.7 46.2  Platelets 150 - 400 K/uL 100(L) 103(L) 99(L)    Discharge Instructions: Discharge Instructions    (HEART FAILURE PATIENTS) Call MD:  Anytime you have any of the following symptoms: 1) 3 pound weight gain in 24 hours or 5 pounds in 1 week 2) shortness of breath, with or without a dry  hacking cough 3) swelling in the hands, feet or stomach 4) if you have to sleep on extra pillows at night in order to breathe.   Complete by: As directed    Call MD for:  severe uncontrolled pain   Complete by: As directed    Diet - low sodium heart healthy   Complete by: As directed    Increase activity slowly   Complete by: As directed       Signed: Maudie Mercury, MD 08/01/2019, 1:23 PM   Pager: [phone]

## 2019-07-31 NOTE — Discharge Instructions (Signed)
To Gregory Tanner,  It was a pleasure working with you during your hospital stay. During your stay you were diagnosed with your acute heart failure exacerbation. You were given IV diuretics and urinated 10 pounds of fluid. Your potassium medication was changed to 40 MEQ (2 tabs) two times a day. Secondly, your metolazone (zaroxolyn) 5 mg two times a week instead of one time a week. Please follow up with your primary care provider to monitor your potassium and your cardiologist to optimize your heart failure medications.     Heart Failure, Diagnosis  Heart failure is a condition in which the heart has trouble pumping blood because it has become weak or stiff. This means that the heart does not pump blood well enough for the body to stay healthy. For some people with heart failure, fluid may back up into the lungs. There may also be swelling (edema) in the lower legs. Heart failure is usually a long-term (chronic) condition. It is important for you to take good care of yourself and follow the treatment plan from your health care provider. What are the causes? This condition may be caused by:  High blood pressure (hypertension). Hypertension causes the heart muscle to work harder than normal. This makes the heart stiff or weak.  Coronary artery disease, or CAD. CAD is the buildup of cholesterol and fat (plaque) in the arteries of the heart.  Heart attack, also called myocardial infarction. This injures the heart muscle, making it hard for the heart to pump blood.  Abnormal heart valves. The valves do not open and close properly, forcing the heart to pump harder to keep the blood flowing.  Heart muscle disease (cardiomyopathy or myocarditis). This is damage to the heart muscle. It can increase the risk of heart failure.  Lung disease. The heart works harder when the lungs are not healthy.  Abnormal heart rhythms. These can lead to heart failure. What increases the risk? The risk of heart failure  increases as a person ages. This condition is also more likely to develop in people who:  Are overweight.  Are male.  Smoke or chew tobacco.  Abuse alcohol or illegal drugs.  Have taken medicines that can damage the heart, such as chemotherapy drugs.  Have diabetes.  Have abnormal heart rhythms.  Have thyroid problems.  Have low blood counts (anemia). What are the signs or symptoms? Symptoms of this condition include:  Shortness of breath with activity, such as when climbing stairs.  A cough that does not go away.  Swelling of the feet, ankles, legs, or abdomen.  Losing weight for no reason.  Trouble breathing when lying flat (orthopnea).  Waking from sleep because of the need to sit up and get more air.  Rapid heartbeat.  Tiredness (fatigue) and loss of energy.  Feeling light-headed, dizzy, or close to fainting.  Loss of appetite.  Nausea.  Waking up more often during the night to urinate (nocturia).  Confusion. How is this diagnosed? This condition is diagnosed based on:  Your medical history, symptoms, and a physical exam.  Diagnostic tests, which may include: ? Echocardiogram. ? Electrocardiogram (ECG). ? Chest X-ray. ? Blood tests. ? Exercise stress test. ? Radionuclide scans. ? Cardiac catheterization and angiogram. How is this treated? Treatment for this condition is aimed at managing the symptoms of heart failure. Medicines Treatment may include medicines that:  Help lower blood pressure by relaxing (dilating) the blood vessels. These medicines are called ACE inhibitors (angiotensin-converting enzyme) and ARBs (angiotensin receptor  blockers).  Cause the kidneys to remove salt and water from the blood through urination (diuretics).  Improve heart muscle strength and prevent the heart from beating too fast (beta blockers).  Increase the force of the heartbeat (digoxin). Healthy behavior changes     Treatment may also include making  healthy lifestyle changes, such as:  Reaching and staying at a healthy weight.  Quitting smoking or chewing tobacco.  Eating heart-healthy foods.  Limiting or avoiding alcohol.  Stopping the use of illegal drugs.  Being physically active.  Other treatments Other treatments may include:  Procedures to open blocked arteries or repair damaged valves.  Placing a pacemaker to improve heart function (cardiac resynchronization therapy).  Placing a device to treat serious abnormal heart rhythms (implantable cardioverter defibrillator, or ICD).  Placing a device to improve the pumping ability of the heart (left ventricular assist device, or LVAD).  Receiving a healthy heart from a donor (heart transplant). This is done when other treatments have not helped. Follow these instructions at home:  Manage other health conditions as told by your health care provider. These may include hypertension, diabetes, thyroid disease, or abnormal heart rhythms.  Get ongoing education and support as needed. Learn as much as you can about heart failure.  Keep all follow-up visits as told by your health care provider. This is important. Summary  Heart failure is a condition in which the heart has trouble pumping blood because it has become weak or stiff.  This condition is caused by high blood pressure and other diseases of the heart and lungs.  Symptoms of this condition include shortness of breath, tiredness (fatigue), nausea, and swelling of the feet, ankles, legs, or abdomen.  Treatments for this condition may include medicines, lifestyle changes, and surgery.  Manage other health conditions as told by your health care provider. This information is not intended to replace advice given to you by your health care provider. Make sure you discuss any questions you have with your health care provider. Document Revised: 04/20/2018 Document Reviewed: 04/20/2018 Elsevier Patient Education  [place].     Heart Failure, Self Care Heart failure is a serious condition. This document explains the things you need to do to take care of yourself after a heart failure diagnosis. You may be asked to change your diet, take certain medicines, and make other lifestyle changes in order to stay as healthy as possible. Your health care provider may also give you more specific instructions. If you have problems or questions, contact your health care provider. What are the risks? Having heart failure puts you at higher risk for certain problems. These problems can get worse if you do not take good care of yourself. Problems may include:  Blood clotting problems. This may cause a stroke.  Damage to the kidneys, liver, or lungs.  Abnormal heart rhythms. Supplies needed:  Scale for monitoring weight.  Blood pressure monitor.  Notebook.  Medicines. How to care for yourself when you have heart failure Medicines Take over-the-counter and prescription medicines only as told by your health care provider. Medicines reduce the workload of your heart, slow the progression of heart failure, and improve symptoms. Take your medicines every day.  Do not stop taking your medicine unless your health care provider tells you to do so.  Do not skip any dose of medicine.  Refill your prescriptions before you run out of medicine. Eating and drinking   Eat heart-healthy foods. Talk with a dietitian to make an  eating plan that is right for you. ? Choose foods that contain no trans fat and are low in saturated fat and cholesterol. Healthy choices include fresh or frozen fruits and vegetables, fish, lean meats, legumes, fat-free or low-fat dairy products, and whole-grain or high-fiber foods. ? Limit salt (sodium) if told by your health care provider. Sodium restriction may reduce symptoms of heart failure. Ask a dietitian to recommend heart-healthy seasonings. ? Use healthy cooking methods instead of  frying. Healthy methods include roasting, grilling, broiling, baking, poaching, steaming, and stir-frying.  Limit your fluid intake, if directed by your health care provider. Fluid restriction may reduce symptoms of heart failure. Alcohol use  Do not drink alcohol if: ? Your health care provider tells you not to drink. ? Your heart was damaged by alcohol, or you have severe heart failure. ? You are pregnant, may be pregnant, or are planning to become pregnant.  If you drink alcohol: ? Limit how much you use to:  0-1 drink a day for women.  0-2 drinks a day for men. ? Be aware of how much alcohol is in your drink. In the [place] one drink equals one 12 oz bottle of beer (355 mL), one 5 oz glass of wine (148 mL), or one 1 oz glass of hard liquor (44 mL). Lifestyle   Do not use any products that contain nicotine or tobacco, such as cigarettes, e-cigarettes, and chewing tobacco. If you need help quitting, ask your health care provider. ? Do not use nicotine gum or patches before talking to your health care provider.  Do not use illegal drugs.  Work with your health care provider to safely reach the right body weight.  Do physical activity if told by your health care provider. Talk to your health care provider before you begin an exercise if: ? You are an older adult. ? You have severe heart failure.  Learn to manage stress. If you need help to do this, ask your health care provider.  Participate in or seek rehabilitation as needed to keep or improve your independence and quality of life.  Plan rest periods when you get tired. Monitoring important information   Weigh yourself every day. This will help you to notice if too much fluid is building up in your body. ? Weigh yourself every morning after you urinate and before you eat breakfast. ? Wear the same amount of clothing each time you weigh yourself. ? Record your daily weight. Provide your health care provider with your weight  record.  Monitor and record your pulse and blood pressure as told by your health care provider. Dealing with extreme temperatures  If the weather is extremely hot: ? Avoid vigorous physical activity. ? Use air conditioning or fans, or find a cooler location. ? Avoid caffeine and alcohol. ? Wear loose-fitting, lightweight, and light-colored clothing.  If the weather is extremely cold: ? Avoid vigorous activity. ? Layer your clothes. ? Wear mittens or gloves, a hat, and a scarf when you go outside. ? Avoid alcohol. Follow these instructions at home:  Stay up to date with vaccines. Pneumococcal and flu (influenza) vaccines are especially important in preventing infections of the airways.  Keep all follow-up visits as told by your health care provider. This is important. Contact a health care provider if you:  Have a rapid weight gain.  Have increasing shortness of breath.  Are unable to participate in your usual physical activities.  Get tired easily.  Cough more than normal, especially  with physical activity.  Lose your appetite or feel nauseous.  Have any swelling or more swelling in areas such as your hands, feet, ankles, or abdomen.  Are unable to sleep because it is hard to breathe.  Feel like your heart is beating quickly (palpitations).  Become dizzy or light-headed when you stand up. Get help right away if you:  Have trouble breathing.  Notice or your family notices a change in your awareness, such as having trouble staying awake or concentrating.  Have pain or discomfort in your chest.  Have an episode of fainting (syncope). These symptoms may represent a serious problem that is an emergency. Do not wait to see if the symptoms will go away. Get medical help right away. Call your local emergency services (911 in the [place] Do not drive yourself to the hospital. Summary  Heart failure is a serious condition. To care for yourself, you may be asked to change  your diet, take certain medicines, and make other lifestyle changes.  Take your medicines every day. Do not stop taking them unless your health care provider tells you to do so.  Eat heart-healthy foods, such as fresh or frozen fruits and vegetables, fish, lean meats, legumes, fat-free or low-fat dairy products, and whole-grain or high-fiber foods.  Ask your health care provider if you have any alcohol restrictions. You may have to stop drinking alcohol if you have severe heart failure.  Contact your health care provider if you notice problems, such as rapid weight gain or a fast heartbeat. Get help right away if you faint, or have chest pain or trouble breathing. This information is not intended to replace advice given to you by your health care provider. Make sure you discuss any questions you have with your health care provider. Document Revised: 05/15/2018 Document Reviewed: 05/16/2018 Elsevier Patient Education  [place].    Heart Failure Eating Plan Heart failure, also called congestive heart failure, occurs when your heart does not pump blood well enough to meet your body's needs for oxygen-rich blood. Heart failure is a long-term (chronic) condition. Living with heart failure can be challenging. However, following your health care provider's instructions about a healthy lifestyle and working with a diet and nutrition specialist (dietitian) to choose the right foods may help to improve your symptoms. What are tips for following this plan? Reading food labels  Check food labels for the amount of sodium per serving. Choose foods that have less than 140 mg (milligrams) of sodium in each serving.  Check food labels for the number of calories per serving. This is important if you need to limit your daily calorie intake to lose weight.  Check food labels for the serving size. If you eat more than one serving, you will be eating more sodium and calories than what is listed on the  label.  Look for foods that are labeled as "sodium-free," "very low sodium," or "low sodium." ? Foods labeled as "reduced sodium" or "lightly salted" may still have more sodium than what is recommended for you. Cooking  Avoid adding salt when cooking. Ask your health care provider or dietitian before using salt substitutes.  Season food with salt-free seasonings, spices, or herbs. Check the label of seasoning mixes to make sure they do not contain salt.  Cook with heart-healthy oils, such as olive, canola, soybean, or sunflower oil.  Do not fry foods. Cook foods using low-fat methods, such as baking, boiling, grilling, and broiling.  Limit unhealthy fats when cooking  by: ? Removing the skin from poultry, such as chicken. ? Removing all visible fats from meats. ? Skimming the fat off from stews, soups, and gravies before serving them. Meal planning   Limit your intake of: ? Processed, canned, or pre-packaged foods. ? Foods that are high in trans fat, such as fried foods. ? Sweets, desserts, sugary drinks, and other foods with added sugar. ? Full-fat dairy products, such as whole milk.  Eat a balanced diet that includes: ? 4-5 servings of fruit each day and 4-5 servings of vegetables each day. At each meal, try to fill half of your plate with fruits and vegetables. ? Up to 6-8 servings of whole grains each day. ? Up to 2 servings of lean meat, poultry, or fish each day. One serving of meat is equal to 3 oz. This is about the same size as a deck of cards. ? 2 servings of low-fat dairy each day. ? Heart-healthy fats. Healthy fats called omega-3 fatty acids are found in foods such as flaxseed and cold-water fish like sardines, salmon, and mackerel.  Aim to eat 25-35 g (grams) of fiber a day. Foods that are high in fiber include apples, broccoli, carrots, beans, peas, and whole grains.  Do not add salt or condiments that contain salt (such as soy sauce) to foods before eating.  When  eating at a restaurant, ask that your food be prepared with less salt or no salt, if possible.  Try to eat 2 or more vegetarian meals each week.  Eat more home-cooked food and eat less restaurant, buffet, and fast food. General information  Do not eat more than 2,300 mg of salt (sodium) a day. The amount of sodium that is recommended for you may be lower, depending on your condition.  Maintain a healthy body weight as directed. Ask your health care provider what a healthy weight is for you. ? Check your weight every day. ? Work with your health care provider and dietitian to make a plan that is right for you to lose weight or maintain your current weight.  Limit how much fluid you drink. Ask your health care provider or dietitian how much fluid you can have each day.  Limit or avoid alcohol as told by your health care provider or dietitian. Recommended foods The items listed may not be a complete list. Talk with your dietitian about what dietary choices are best for you. Fruits All fresh, frozen, and canned fruits. Dried fruits, such as raisins, prunes, and cranberries. Vegetables All fresh vegetables. Vegetables that are frozen without sauce or added salt. Low-sodium or sodium-free canned vegetables. Grains Bread with less than 80 mg of sodium per slice. Whole-wheat pasta, quinoa, and brown rice. Oats and oatmeal. Barley. [place]. Grits and cream of wheat. Whole-grain and whole-wheat cold cereal. Meats and other protein foods Lean cuts of meat. Skinless chicken and [place]. Fish with high omega-3 fatty acids, such as salmon, sardines, and other cold-water fishes. Eggs. Dried beans, peas, and edamame. Unsalted nuts and nut butters. Dairy Low-fat or nonfat (skim) milk and dried milk. Rice milk, soy milk, and almond milk. Low-fat or nonfat yogurt. Small amounts of reduced-sodium block cheese. Low-sodium cottage cheese. Fats and oils Olive, canola, soybean, flaxseed, or sunflower oil.  Avocado. Sweets and desserts Apple sauce. Granola bars. Sugar-free pudding and gelatin. Frozen fruit bars. Seasoning and other foods Fresh and dried herbs. Lemon or lime juice. Vinegar. Low-sodium ketchup. Salt-free marinades, salad dressings, sauces, and seasonings. The items listed above  may not be a complete list of foods and beverages you can eat. Contact a dietitian for more information. Foods to avoid The items listed may not be a complete list. Talk with your dietitian about what dietary choices are best for you. Fruits Fruits that are dried with sodium-containing preservatives. Vegetables Canned vegetables. Frozen vegetables with sauce or seasonings. Creamed vegetables. [place] fries. Onion rings. Pickled vegetables and sauerkraut. Grains Bread with more than 80 mg of sodium per slice. Hot or cold cereal with more than 140 mg sodium per serving. Salted pretzels and crackers. Pre-packaged breadcrumbs. Bagels, croissants, and biscuits. Meats and other protein foods Ribs and chicken wings. Bacon, ham, pepperoni, bologna, salami, and packaged luncheon meats. Hot dogs, bratwurst, and sausage. Canned meat. Smoked meat and fish. Salted nuts and seeds. Dairy Whole milk, half-and-half, and cream. Buttermilk. Processed cheese, cheese spreads, and cheese curds. Regular cottage cheese. Feta cheese. Shredded cheese. String cheese. Fats and oils Butter, lard, shortening, ghee, and bacon fat. Canned and packaged gravies. Seasoning and other foods Onion salt, garlic salt, table salt, and sea salt. Marinades. Regular salad dressings. Relishes, pickles, and olives. Meat flavorings and tenderizers, and bouillon cubes. Horseradish, ketchup, and mustard. Worcestershire sauce. Teriyaki sauce, soy sauce (including reduced sodium). Hot sauce and Tabasco sauce. Steak sauce, fish sauce, oyster sauce, and cocktail sauce. Taco seasonings. Barbecue sauce. Tartar sauce. The items listed above may not be a complete  list of foods and beverages you should avoid. Contact a dietitian for more information. Summary  A heart failure eating plan includes changes that limit your intake of sodium and unhealthy fat, and it may help you lose weight or maintain a healthy weight. Your health care provider may also recommend limiting how much fluid you drink.  Most people with heart failure should eat no more than 2,300 mg of salt (sodium) a day. The amount of sodium that is recommended for you may be lower, depending on your condition.  Contact your health care provider or dietitian before making any major changes to your diet. This information is not intended to replace advice given to you by your health care provider. Make sure you discuss any questions you have with your health care provider. Document Revised: 03/29/2018 Document Reviewed: 06/17/2016 Elsevier Patient Education  [place].

## 2019-08-09 DIAGNOSIS — E876 Hypokalemia: Secondary | ICD-10-CM | POA: Diagnosis not present

## 2019-08-12 DIAGNOSIS — Z09 Encounter for follow-up examination after completed treatment for conditions other than malignant neoplasm: Secondary | ICD-10-CM | POA: Diagnosis not present

## 2019-08-12 DIAGNOSIS — N183 Chronic kidney disease, stage 3 unspecified: Secondary | ICD-10-CM | POA: Diagnosis not present

## 2019-08-12 DIAGNOSIS — E876 Hypokalemia: Secondary | ICD-10-CM | POA: Diagnosis not present

## 2019-08-12 DIAGNOSIS — Z6829 Body mass index (BMI) 29.0-29.9, adult: Secondary | ICD-10-CM | POA: Diagnosis not present

## 2019-08-28 NOTE — Progress Notes (Signed)
Cardiology Office Note:    Date:  08/29/2019   ID:  Gregory Tanner, DOB [birth year], MRN [id number]  PCP:  Gregory Hipps, MD  Cardiologist:  Gregory More, MD    Referring MD: Gregory Hipps, MD    ASSESSMENT:    1. Hypertensive heart disease with heart failure ([place])   2. Chronic diastolic heart failure (HCC)   3. Heart failure, diastolic, acute on chronic ([place])   4. Shortness of breath    PLAN:    In order of problems listed above:  1. His heart failure is improved but still is fluid overloaded I will keep him on higher dose of Bumex we will recheck his BMP and proBNP level.  His echocardiogram shows findings of mild mitral stenosis diastolic dysfunction and clinically his heart failure is predominantly right-sided.  We will recheck his potassium as well as magnesium.  If he continues to be hypokalemic I will place him on a minimum dose of MRA to see if we can attenuate his potassium loss taking high-dose loop diuretic and metolazone.   Next appointment: 6 weeks if not improved I will discuss with him referral to advanced heart failure to do for hemodynamic cath   Medication Adjustments/Labs and Tests Ordered: Current medicines are reviewed at length with the patient today.  Concerns regarding medicines are outlined above.  Orders Placed This Encounter  Procedures  . Magnesium  . Pro b natriuretic peptide (BNP)  . Basic metabolic panel   Meds ordered this encounter  Medications  . bumetanide (BUMEX) 2 MG tablet    Sig: Take 1.5 tablets (3 mg total ) by mouth daily in the morning, and one tablet (2 mg total) by mouth daily in the evening.    Dispense:  120 tablet    Refill:  3    Chief Complaint  Patient presents with  . Follow-up  . Congestive Heart Failure    History of Present Illness:    Gregory Tanner is a 64 y.o. male with a hx of liver transplantation, hypertension with CKD and heart failure   last seen by me 06/17/2019 at that time he had severe  hypokalemia potassium 2.7..  He was admitted to [facility] with heart failure 07/24/2019 discharge 07/31/2019 had an echocardiogram performed my office 07/03/2019 which showed ejection fraction 60 to 65% moderate left atrial enlargement right ventricle is normal in size and function with moderate elevation of pulmonary artery systolic pressure.  Although not mentioned in report is diastolic function was pseudonormal elevated left ventricular end-diastolic pressure and left atrial pressure and he had nonrheumatic mild mitral stenosis with a mean gradient of 6 mm. Compliance with diet, lifestyle and medications: Yes he is meticulous in his self-care   4 wk ago  (07/31/19) 4 wk ago  (07/31/19) 4 wk ago  (07/30/19)   Sodium 135 - 145 mmol/L 141   141   Potassium 3.5 - 5.1 mmol/L 3.4Low   3.3Low   Chloride 98 - 111 mmol/L 98   99   CO2 22 - 32 mmol/L 29   27   Glucose, Bld 70 - 99 mg/dL 175High   89 CM   Comment: Glucose reference range applies only to samples taken after fasting for at least 8 hours.  BUN 8 - 23 mg/dL 83High   84High   Creatinine, Ser 0.61 - 1.24 mg/dL 2.00High  2.01High  2.07High   Calcium 8.9 - 10.3 mg/dL 9.5   9.6   GFR calc  non Af Amer >60 mL/min 34Low  34Low  33Low    Ref Range & Units 1 mo ago  B Natriuretic Peptide 0.0 - 100.0 pg/mL 292   I reviewed the chest x-ray 07/24/2019 which showed findings of small right pleural effusion and pulmonary vascular congestion.  I reviewed the EKG independently 07/26/2019 showing sinus rhythm left atrial enlargement right bundle branch block  Is improved his weight is down from 2 21-211.4 at discharge and to 8.6 but still has edema.  He has no orthopnea or shortness of breath bending over but he short of breath walking outdoors and still has lower extremity edema.  No chest pain palpitation or syncope.  He had a recent sinus infection and is concerned that he is not cleared as of yet. Past Medical History:    Diagnosis Date  . Ankylosis of lumbar spine 02/21/2014  . Benign essential hypertension 10/26/2015  . BP (high blood pressure) 12/27/2010  . Chronic pain associated with significant psychosocial dysfunction 06/17/2015  . Chronic pain disorder 10/28/2015  . Degeneration of intervertebral disc of lumbar region 07/05/2013  . Degenerative disc disease, lumbar 07/26/2012  . Diabetes mellitus without complication ([place])   . Encounter for other specified prophylactic measures 10/10/2011  . Excess weight 10/10/2011  . Hearing loss   . Hepatocellular carcinoma ([place]) 12/27/2010   Overview:  Embolized 6/12   . History of liver transplant ([place]) 07/20/2011   Overview:  06/07/2011 for cryptogenic cirrhosis and HCC   . Hypertension   . Liver disease   . Lumbar radiculopathy 10/10/2012  . Nerve root pain 06/17/2015   Overview:  Right   . SOB (shortness of breath) on exertion 10/28/2015  . Swelling   . Type 2 diabetes mellitus ([place]) 12/27/2010   Overview:  H1C 6.5 12/2010     Past Surgical History:  Procedure Laterality Date  . BACK SURGERY    . BAKER CYST SURGERY     RIGHT LEG  . EYE SURGERY Left   . HERNIA REPAIR    . LIVER SURGERY     IMPLANT  . LIVER TRANSPLANT    . TONSILL SURGERY      Current Medications: Current Meds  Medication Sig  . allopurinol (ZYLOPRIM) 300 MG tablet Take 300 mg by mouth daily.   Marland Kitchen aspirin 81 MG tablet Take 81 mg by mouth daily.  . bumetanide (BUMEX) 2 MG tablet Take 1.5 tablets (3 mg total ) by mouth daily in the morning, and one tablet (2 mg total) by mouth daily in the evening.  . cefdinir (OMNICEF) 300 MG capsule Take 300 mg by mouth 2 (two) times daily.  Marland Kitchen gemfibrozil (LOPID) 600 MG tablet TAKE 1/2 TABLET BY MOUTH TWICE (2) DAILY BEFORE A MEAL (Patient taking differently: Take 300 mg by mouth 2 (two) times daily before a meal. )  . hydrALAZINE (APRESOLINE) 25 MG tablet Take 12.5-25 mg by mouth in the morning and at bedtime.   . insulin regular human CONCENTRATED  (HUMULIN R U-500 KWIKPEN) 500 UNIT/ML kwikpen Inject 50-110 Units into the skin 3 (three) times daily with meals. Inject 105 units with breakfast, 50-60 units with midday meal depending on the meal, and 100 units with dinner  . metolazone (ZAROXOLYN) 5 MG tablet Take 1 tablet (5 mg total) by mouth 2 (two) times a week.  . Multiple Vitamin (MULTIVITAMIN) tablet Take 1 tablet by mouth daily.  . mycophenolate (CELLCEPT) 250 MG capsule Take 750 mg by mouth 2 (two) times daily.  Marland Kitchen  potassium chloride SA (KLOR-CON) 20 MEQ tablet Take 2 tablets (40 mEq total) by mouth in the morning and at bedtime.  . tacrolimus (PROGRAF) 0.5 MG capsule Take 0.5 capsules by mouth See admin instructions. Takes 1 capsule in the am, and 1 capsule at night every other night  . [DISCONTINUED] bumetanide (BUMEX) 2 MG tablet Take 1 tablet (2 mg total) by mouth 2 (two) times daily.     Allergies:   Iodinated diagnostic agents, Latex, Metformin and related, and Rosiglitazone   Social History   Socioeconomic History  . Marital status: Married    Spouse name: Not on file  . Number of children: Not on file  . Years of education: Not on file  . Highest education level: Not on file  Occupational History  . Not on file  Tobacco Use  . Smoking status: Never Smoker  . Smokeless tobacco: Never Used  Vaping Use  . Vaping Use: Never used  Substance and Sexual Activity  . Alcohol use: No    Alcohol/week: 0.0 standard drinks  . Drug use: No  . Sexual activity: Not on file  Other Topics Concern  . Not on file  Social History Narrative  . Not on file   Social Determinants of Health   Financial Resource Strain:   . Difficulty of Paying Living Expenses:   Food Insecurity:   . Worried About Charity fundraiser in the Last Year:   . Arboriculturist in the Last Year:   Transportation Needs:   . Film/video editor (Medical):   Marland Kitchen Lack of Transportation (Non-Medical):   Physical Activity:   . Days of Exercise per Week:     . Minutes of Exercise per Session:   Stress:   . Feeling of Stress :   Social Connections:   . Frequency of Communication with Friends and Family:   . Frequency of Social Gatherings with Friends and Family:   . Attends Religious Services:   . Active Member of Clubs or Organizations:   . Attends Archivist Meetings:   Marland Kitchen Marital Status:      Family History: The patient's family history includes Cancer in his mother; Diabetes in his father; Heart attack in his father; Hypertension in his brother. ROS:   Please see the history of present illness.    All other systems reviewed and are negative.  EKGs/Labs/Other Studies Reviewed:    The following studies were reviewed today  Recent Labs: 07/24/2019: ALT 17; B Natriuretic Peptide 292.1 07/28/2019: Hemoglobin 14.5; Platelets 100 07/30/2019: Magnesium 2.5 07/31/2019: BUN 83; Creatinine, Ser 2.00; Potassium 3.4; Sodium 141  Recent Lipid Panel    Component Value Date/Time   CHOL 87 (L) 08/30/2018 0842   TRIG 192 (H) 08/30/2018 0842   HDL 24 (L) 08/30/2018 0842   CHOLHDL 3.6 08/30/2018 0842   LDLCALC 25 08/30/2018 0842    Physical Exam:    VS:  BP (!) 128/58   Pulse 88   Ht 5\' 10"  (1.778 m)   Wt 212 lb 9.6 oz (96.4 kg)   SpO2 95%   BMI 30.50 kg/m     Wt Readings from Last 3 Encounters:  08/29/19 212 lb 9.6 oz (96.4 kg)  07/31/19 212 lb 12.8 oz (96.5 kg)  07/24/19 223 lb 3.2 oz (101.2 kg)     GEN:  Well nourished, well developed in no acute distress HEENT: Normal NECK: He has moderate pulsatile neck vein distention with a steep descent JVD; No carotid  bruits LYMPHATICS: No lymphadenopathy CARDIAC: P2 is accentuated he has an RV heave RRR, no murmurs, rubs, gallops RESPIRATORY:  Clear to auscultation without rales, wheezing or rhonchi  ABDOMEN: Soft, non-tender, non-distended MUSCULOSKELETAL: Still 2-3+ lower extremity pitting edema to the knees edema; No deformity  SKIN: Warm and dry NEUROLOGIC:  Alert and  oriented x 3 PSYCHIATRIC:  Normal affect    Signed, Gregory More, MD  08/29/2019 11:08 AM    [place]

## 2019-08-29 ENCOUNTER — Other Ambulatory Visit: Payer: Self-pay

## 2019-08-29 ENCOUNTER — Ambulatory Visit: Payer: Medicare Other | Admitting: Cardiology

## 2019-08-29 ENCOUNTER — Encounter: Payer: Self-pay | Admitting: Cardiology

## 2019-08-29 VITALS — BP 128/58 | HR 88 | Ht 70.0 in | Wt 212.6 lb

## 2019-08-29 DIAGNOSIS — R0602 Shortness of breath: Secondary | ICD-10-CM

## 2019-08-29 DIAGNOSIS — I5032 Chronic diastolic (congestive) heart failure: Secondary | ICD-10-CM

## 2019-08-29 DIAGNOSIS — I5033 Acute on chronic diastolic (congestive) heart failure: Secondary | ICD-10-CM

## 2019-08-29 DIAGNOSIS — I110 Hypertensive heart disease with heart failure: Secondary | ICD-10-CM

## 2019-08-29 MED ORDER — BUMETANIDE 2 MG PO TABS: ORAL_TABLET | ORAL | 3 refills | Status: DC

## 2019-08-29 NOTE — Patient Instructions (Signed)
Medication Instructions:  Your physician has recommended you make the following change in your medication:  CHANGE: Bumex 3 mg by mouth daily in the morning, then 2 mg daily in the evening.  *If you need a refill on your cardiac medications before your next appointment, please call your pharmacy*   Lab Work: Your physician recommends that you return for lab work in: TODAY BMP, ProBNP, [place] If you have labs (blood work) drawn today and your tests are completely normal, you will receive your results only by: Marland Kitchen MyChart Message (if you have MyChart) OR . A paper copy in the mail If you have any lab test that is abnormal or we need to change your treatment, we will call you to review the results.   Testing/Procedures: None   Follow-Up: At [facility], you and your health needs are our priority.  As part of our continuing mission to provide you with exceptional heart care, we have created designated Provider Care Teams.  These Care Teams include your primary Cardiologist (physician) and Advanced Practice Providers (APPs -  Physician Assistants and Nurse Practitioners) who all work together to provide you with the care you need, when you need it.  We recommend signing up for the patient portal called "MyChart".  Sign up information is provided on this After Visit Summary.  MyChart is used to connect with patients for Virtual Visits (Telemedicine).  Patients are able to view lab/test results, encounter notes, upcoming appointments, etc.  Non-urgent messages can be sent to your provider as well.   To learn more about what you can do with MyChart, go to [email or web address].    Your next appointment:   6 week(s)  The format for your next appointment:   In Person  Provider:   Shirlee More, MD   Other Instructions

## 2019-08-30 ENCOUNTER — Telehealth: Payer: Self-pay

## 2019-08-30 LAB — BASIC METABOLIC PANEL
BUN/Creatinine Ratio: 40 — ABNORMAL HIGH (ref 10–24)
BUN: 82 mg/dL (ref 8–27)
CO2: 25 mmol/L (ref 20–29)
Calcium: 9.7 mg/dL (ref 8.6–10.2)
Chloride: 94 mmol/L — ABNORMAL LOW (ref 96–106)
Creatinine, Ser: 2.04 mg/dL — ABNORMAL HIGH (ref 0.76–1.27)
GFR calc Af Amer: 38 mL/min/{1.73_m2} — ABNORMAL LOW (ref >59)
GFR calc non Af Amer: 33 mL/min/{1.73_m2} — ABNORMAL LOW (ref >59)
Glucose: 133 mg/dL — ABNORMAL HIGH (ref 65–99)
Potassium: 3.3 mmol/L — ABNORMAL LOW (ref 3.5–5.2)
Sodium: 137 mmol/L (ref 134–144)

## 2019-08-30 LAB — PRO B NATRIURETIC PEPTIDE: NT-Pro BNP: 1897 pg/mL — ABNORMAL HIGH (ref 0–376)

## 2019-08-30 LAB — MAGNESIUM: Magnesium: 2.3 mg/dL (ref 1.6–2.3)

## 2019-08-30 MED ORDER — POTASSIUM CHLORIDE CRYS ER 20 MEQ PO TBCR: 40.0000 meq | EXTENDED_RELEASE_TABLET | Freq: Three times a day (TID) | ORAL | 3 refills | Status: DC

## 2019-08-30 NOTE — Telephone Encounter (Signed)
-----   Message from Gregory Priest, MD sent at 08/30/2019  9:51 AM EDT ----- His CKD is stable his potassium remains low  In general the distal diuretics like Inspra helped to maintain his serum potassium.  Generally though it is a concern with his degree of kidney function.  1 option would be to take a minimal dose 1 to 2 days a week following his kidney function closely if we choose this he needs to have a BMP done every week for 3 weeks I will place him on 12-1/2 mg 2 days a week.  I am concerned because his potassium remains depleted despite IV and oral preparations.  If he does not want to try taking this minimal dose of the distal diuretic I would take his 40 mEq of potassium 3 times a day rather than 2 and I just reviewed his medications.

## 2019-08-30 NOTE — Telephone Encounter (Signed)
Spoke with the patient just now. I let him know these different options and he states that he would like to try increasing the potassium before trying a low-dose diuretic. I let him know that he should take this medication three times daily now instead of twice and he verbalizes understanding. No other issues or concerns were noted at this time.    Encouraged patient to call back with any questions or concerns.

## 2019-09-03 ENCOUNTER — Other Ambulatory Visit: Payer: Self-pay | Admitting: Cardiology

## 2019-09-03 NOTE — Telephone Encounter (Signed)
Rx refill sent to pharmacy. 

## 2019-09-10 ENCOUNTER — Other Ambulatory Visit: Payer: Self-pay | Admitting: Cardiology

## 2019-09-10 MED ORDER — METOLAZONE 5 MG PO TABS: 5.0000 mg | ORAL_TABLET | ORAL | 3 refills | Status: DC

## 2019-09-10 NOTE — Telephone Encounter (Signed)
*  STAT* If patient is at the pharmacy, call can be transferred to refill team.   1. Which medications need to be refilled? (please list name of each medication and dose if known)   metolazone (ZAROXOLYN) 5 MG tablet     2. Which pharmacy/location (including street and city if local pharmacy) is medication to be sent to? [place]  3. Do they need a 30 day or 90 day supply? 90 day supply

## 2019-09-10 NOTE — Telephone Encounter (Signed)
Refill sent in per request.  

## 2019-09-12 DIAGNOSIS — J329 Chronic sinusitis, unspecified: Secondary | ICD-10-CM | POA: Diagnosis not present

## 2019-09-12 DIAGNOSIS — Z20828 Contact with and (suspected) exposure to other viral communicable diseases: Secondary | ICD-10-CM | POA: Diagnosis not present

## 2019-09-12 DIAGNOSIS — Z6829 Body mass index (BMI) 29.0-29.9, adult: Secondary | ICD-10-CM | POA: Diagnosis not present

## 2019-09-12 DIAGNOSIS — J40 Bronchitis, not specified as acute or chronic: Secondary | ICD-10-CM | POA: Diagnosis not present

## 2019-09-17 ENCOUNTER — Ambulatory Visit: Payer: Medicare Other | Admitting: Cardiology

## 2019-09-20 DIAGNOSIS — Z944 Liver transplant status: Secondary | ICD-10-CM | POA: Diagnosis not present

## 2019-09-26 DIAGNOSIS — I129 Hypertensive chronic kidney disease with stage 1 through stage 4 chronic kidney disease, or unspecified chronic kidney disease: Secondary | ICD-10-CM | POA: Diagnosis not present

## 2019-09-26 DIAGNOSIS — E1122 Type 2 diabetes mellitus with diabetic chronic kidney disease: Secondary | ICD-10-CM | POA: Diagnosis not present

## 2019-09-26 DIAGNOSIS — I509 Heart failure, unspecified: Secondary | ICD-10-CM | POA: Diagnosis not present

## 2019-09-26 DIAGNOSIS — N1832 Chronic kidney disease, stage 3b: Secondary | ICD-10-CM | POA: Diagnosis not present

## 2019-09-30 DIAGNOSIS — J309 Allergic rhinitis, unspecified: Secondary | ICD-10-CM | POA: Diagnosis not present

## 2019-10-10 DIAGNOSIS — H524 Presbyopia: Secondary | ICD-10-CM | POA: Diagnosis not present

## 2019-10-15 DIAGNOSIS — H9190 Unspecified hearing loss, unspecified ear: Secondary | ICD-10-CM | POA: Insufficient documentation

## 2019-10-15 DIAGNOSIS — E119 Type 2 diabetes mellitus without complications: Secondary | ICD-10-CM | POA: Insufficient documentation

## 2019-10-15 DIAGNOSIS — K769 Liver disease, unspecified: Secondary | ICD-10-CM | POA: Insufficient documentation

## 2019-10-15 DIAGNOSIS — R609 Edema, unspecified: Secondary | ICD-10-CM | POA: Insufficient documentation

## 2019-10-15 DIAGNOSIS — I10 Essential (primary) hypertension: Secondary | ICD-10-CM | POA: Insufficient documentation

## 2019-10-16 ENCOUNTER — Other Ambulatory Visit: Payer: Self-pay

## 2019-10-16 ENCOUNTER — Encounter: Payer: Self-pay | Admitting: Cardiology

## 2019-10-16 ENCOUNTER — Ambulatory Visit: Payer: Medicare Other | Admitting: Cardiology

## 2019-10-16 VITALS — BP 134/62 | HR 92 | Ht 70.0 in | Wt 212.0 lb

## 2019-10-16 DIAGNOSIS — N183 Chronic kidney disease, stage 3 unspecified: Secondary | ICD-10-CM | POA: Diagnosis not present

## 2019-10-16 DIAGNOSIS — Z944 Liver transplant status: Secondary | ICD-10-CM

## 2019-10-16 DIAGNOSIS — I110 Hypertensive heart disease with heart failure: Secondary | ICD-10-CM | POA: Diagnosis not present

## 2019-10-16 DIAGNOSIS — I5032 Chronic diastolic (congestive) heart failure: Secondary | ICD-10-CM

## 2019-10-16 MED ORDER — METOLAZONE 5 MG PO TABS: 5.0000 mg | ORAL_TABLET | ORAL | 3 refills | Status: DC

## 2019-10-16 NOTE — Progress Notes (Signed)
Cardiology Office Note:    Date:  10/16/2019   ID:  Gregory Tanner, DOB August 14, 1955, MRN [id number]  PCP:  Ronita Hipps, MD  Cardiologist:  Shirlee More, MD    Referring MD: Ronita Hipps, MD    ASSESSMENT:    1. Hypertensive heart disease with heart failure ([place])   2. Chronic diastolic heart failure (HCC)   3. Stage 3 chronic kidney disease, unspecified whether stage 3a or 3b CKD   4. History of liver transplant ([place])    PLAN:    In order of problems listed above:  1. Very difficult to manage his diastolic heart failure with there is superimposed CKD.  He is followed by nephrology tells me he is stable.  I will increase his metolazone from 2 to 3 days a week and continue his current loop diuretic recheck renal function in 2 weeks.  BP is at target continue treatment including his diuretics and hydralazine.  Continues to follow at [facility] for his liver transplantation had his third dose of vaccine as he is immunocompromised.   Next appointment: 3 months   Medication Adjustments/Labs and Tests Ordered: Current medicines are reviewed at length with the patient today.  Concerns regarding medicines are outlined above.  Orders Placed This Encounter  Procedures  . Pro b natriuretic peptide (BNP)  . Basic metabolic panel   No orders of the defined types were placed in this encounter.   Chief Complaint  Patient presents with  . Follow-up  . Congestive Heart Failure    History of Present Illness:    Gregory Tanner is a 64 y.o. male with a hx of  liver transplantation, hypertension with CKD and heart failure    last seen 08/29/2019.  He was admitted to [facility] 07/24/2019 with decompensated heart failure and severe hypokalemia.  Last ejection fraction May 2021 EF 60 to 65% with findings of decompensated diastolic heart failure. Compliance with diet, lifestyle and medications: Yes  Initially we increased thousand to 3 times a week and a nice response or weight of  less than 200 pounds but is pinpointed now.  He tells me he was seen by one of the nephrologist CKD in [place] does not know the name and their advice was to take metolazone every day if needed.  1 going to do is increase it to 3 days a week 2 weeks we will check his renal function follow-up in the office in 3 months.  He has edema but no shortness of breath chest pain palpitation or syncope and EKG shows that he maintains sinus rhythm. Past Medical History:  Diagnosis Date  . Ankylosis of lumbar spine 02/21/2014  . Benign essential hypertension 10/26/2015  . BP (high blood pressure) 12/27/2010  . Chronic diastolic heart failure ([place]) 09/26/2016  . Chronic pain associated with significant psychosocial dysfunction 06/17/2015  . Chronic pain disorder 10/28/2015  . CKD (chronic kidney disease) stage 3, GFR 30-59 ml/min 03/27/2017  . Degeneration of intervertebral disc of lumbar region 07/05/2013  . Degenerative disc disease, lumbar 07/26/2012  . Diabetes mellitus without complication ([place])   . Encounter for other specified prophylactic measures 10/10/2011  . Excess weight 10/10/2011  . Hearing loss   . Heart failure, diastolic, acute on chronic ([place]) 07/24/2019  . Hepatocellular carcinoma ([place]) 12/27/2010   Overview:  Embolized 6/12   . History of liver transplant ([place]) 07/20/2011   Overview:  06/07/2011 for cryptogenic cirrhosis and HCC   . Hypertension   .  Hypertensive heart disease with heart failure ([place]) 10/26/2015  . Liver disease   . Lumbar radiculopathy 10/10/2012  . Nerve root pain 06/17/2015   Overview:  Right   . SOB (shortness of breath) on exertion 10/28/2015  . Swelling   . Type 2 diabetes mellitus ([place]) 12/27/2010   Overview:  H1C 6.5 12/2010     Past Surgical History:  Procedure Laterality Date  . BACK SURGERY    . BAKER CYST SURGERY     RIGHT LEG  . EYE SURGERY Left   . HERNIA REPAIR    . LIVER SURGERY     IMPLANT  . LIVER TRANSPLANT    . TONSILL SURGERY      Current  Medications: Current Meds  Medication Sig  . allopurinol (ZYLOPRIM) 300 MG tablet Take 300 mg by mouth daily.   Marland Kitchen aspirin 81 MG tablet Take 81 mg by mouth daily.  . bumetanide (BUMEX) 2 MG tablet Take 1.5 tablets (3 mg total ) by mouth daily in the morning, and one tablet (2 mg total) by mouth daily in the evening.  . cefdinir (OMNICEF) 300 MG capsule Take 300 mg by mouth 2 (two) times daily.  Marland Kitchen gemfibrozil (LOPID) 600 MG tablet TAKE 1/2 TABLET BY MOUTH TWICE (2) DAILY BEFORE A MEAL (Patient taking differently: Take 300 mg by mouth 2 (two) times daily before a meal. )  . hydrALAZINE (APRESOLINE) 25 MG tablet TAKE 1/2 TABLET BY MOUTH 3 TIMES DAILY IF YOUR SYSTOLIC BLOOD PRESSURE (TOP NUMBER) IS GREATER THAN 140  . insulin regular human CONCENTRATED (HUMULIN R U-500 KWIKPEN) 500 UNIT/ML kwikpen Inject 50-110 Units into the skin 3 (three) times daily with meals. Inject 105 units with breakfast, 50-60 units with midday meal depending on the meal, and 100 units with dinner  . metolazone (ZAROXOLYN) 5 MG tablet Take 1 tablet (5 mg total) by mouth 2 (two) times a week.  . Multiple Vitamin (MULTIVITAMIN) tablet Take 1 tablet by mouth daily.  . mycophenolate (CELLCEPT) 250 MG capsule Take 750 mg by mouth 2 (two) times daily.  . potassium chloride SA (KLOR-CON) 20 MEQ tablet Take 2 tablets (40 mEq total) by mouth 3 (three) times daily.  . tacrolimus (PROGRAF) 0.5 MG capsule Take 0.5 capsules by mouth See admin instructions. Takes 1 capsule in the am, and 1 capsule at night every other night     Allergies:   Iodinated diagnostic agents, Latex, Metformin and related, and Rosiglitazone   Social History   Socioeconomic History  . Marital status: Married    Spouse name: Not on file  . Number of children: Not on file  . Years of education: Not on file  . Highest education level: Not on file  Occupational History  . Not on file  Tobacco Use  . Smoking status: Never Smoker  . Smokeless tobacco: Never  Used  Vaping Use  . Vaping Use: Never used  Substance and Sexual Activity  . Alcohol use: No    Alcohol/week: 0.0 standard drinks  . Drug use: No  . Sexual activity: Not on file  Other Topics Concern  . Not on file  Social History Narrative  . Not on file   Social Determinants of Health   Financial Resource Strain:   . Difficulty of Paying Living Expenses: Not on file  Food Insecurity:   . Worried About Charity fundraiser in the Last Year: Not on file  . Ran Out of Food in the Last Year: Not on  file  Transportation Needs:   . Film/video editor (Medical): Not on file  . Lack of Transportation (Non-Medical): Not on file  Physical Activity:   . Days of Exercise per Week: Not on file  . Minutes of Exercise per Session: Not on file  Stress:   . Feeling of Stress : Not on file  Social Connections:   . Frequency of Communication with Friends and Family: Not on file  . Frequency of Social Gatherings with Friends and Family: Not on file  . Attends Religious Services: Not on file  . Active Member of Clubs or Organizations: Not on file  . Attends Archivist Meetings: Not on file  . Marital Status: Not on file     Family History: The patient's family history includes Cancer in his mother; Diabetes in his father; Heart attack in his father; Hypertension in his brother. ROS:   Please see the history of present illness.    All other systems reviewed and are negative.  EKGs/Labs/Other Studies Reviewed:    The following studies were reviewed today:  EKG:  EKG ordered today and personally reviewed.  The ekg ordered today demonstrates sinus rhythm right axis deviation otherwise normal.  Recent Labs: 07/24/2019: ALT 17; B Natriuretic Peptide 292.1 07/28/2019: Hemoglobin 14.5; Platelets 100 08/29/2019: BUN 82; Creatinine, Ser 2.04; Magnesium 2.3; NT-Pro BNP 1,897; Potassium 3.3; Sodium 137  Recent Lipid Panel    Component Value Date/Time   CHOL 87 (L) 08/30/2018 0842    TRIG 192 (H) 08/30/2018 0842   HDL 24 (L) 08/30/2018 0842   CHOLHDL 3.6 08/30/2018 0842   LDLCALC 25 08/30/2018 0842    Physical Exam:    VS:  BP 134/62   Pulse 92   Ht 5\' 10"  (1.778 m)   Wt 212 lb (96.2 kg)   BMI 30.42 kg/m     Wt Readings from Last 3 Encounters:  10/16/19 212 lb (96.2 kg)  08/29/19 212 lb 9.6 oz (96.4 kg)  07/31/19 212 lb 12.8 oz (96.5 kg)     GEN:  Well nourished, well developed in no acute distress HEENT: Normal NECK: No JVD; No carotid bruits LYMPHATICS: No lymphadenopathy CARDIAC: RRR, no murmurs, rubs, gallops RESPIRATORY:  Clear to auscultation without rales, wheezing or rhonchi  ABDOMEN: Soft, non-tender, non-distended MUSCULOSKELETAL: 2+ bilateral lower extremity pitting edema; No deformity  SKIN: Warm and dry NEUROLOGIC:  Alert and oriented x 3 PSYCHIATRIC:  Normal affect    Signed, Shirlee More, MD  10/16/2019 10:42 AM    [place]

## 2019-10-16 NOTE — Patient Instructions (Signed)
Medication Instructions:  Your physician has recommended you make the following change in your medication:  INCRASE: Metalazone 5 mg take one tablet by mouth to Monday, Wednesday, and Friday *If you need a refill on your cardiac medications before your next appointment, please call your pharmacy*   Lab Work: Your physician recommends that you return for lab work in: 2 weeks BMP, ProBNP If you have labs (blood work) drawn today and your tests are completely normal, you will receive your results only by: Marland Kitchen MyChart Message (if you have MyChart) OR . A paper copy in the mail If you have any lab test that is abnormal or we need to change your treatment, we will call you to review the results.   Testing/Procedures: None   Follow-Up: At [facility], you and your health needs are our priority.  As part of our continuing mission to provide you with exceptional heart care, we have created designated Provider Care Teams.  These Care Teams include your primary Cardiologist (physician) and Advanced Practice Providers (APPs -  Physician Assistants and Nurse Practitioners) who all work together to provide you with the care you need, when you need it.  We recommend signing up for the patient portal called "MyChart".  Sign up information is provided on this After Visit Summary.  MyChart is used to connect with patients for Virtual Visits (Telemedicine).  Patients are able to view lab/test results, encounter notes, upcoming appointments, etc.  Non-urgent messages can be sent to your provider as well.   To learn more about what you can do with MyChart, go to [email or web address].    Your next appointment:   3 month(s)  The format for your next appointment:   In Person  Provider:   Shirlee More, MD   Other Instructions

## 2019-10-22 DIAGNOSIS — Z79899 Other long term (current) drug therapy: Secondary | ICD-10-CM | POA: Diagnosis not present

## 2019-10-22 DIAGNOSIS — E876 Hypokalemia: Secondary | ICD-10-CM | POA: Diagnosis not present

## 2019-10-24 DIAGNOSIS — Z87898 Personal history of other specified conditions: Secondary | ICD-10-CM | POA: Diagnosis not present

## 2019-10-24 DIAGNOSIS — J342 Deviated nasal septum: Secondary | ICD-10-CM | POA: Diagnosis not present

## 2019-10-24 DIAGNOSIS — J3489 Other specified disorders of nose and nasal sinuses: Secondary | ICD-10-CM | POA: Diagnosis not present

## 2019-11-08 DIAGNOSIS — R600 Localized edema: Secondary | ICD-10-CM | POA: Diagnosis not present

## 2019-11-08 DIAGNOSIS — D849 Immunodeficiency, unspecified: Secondary | ICD-10-CM | POA: Diagnosis not present

## 2019-11-08 DIAGNOSIS — Z944 Liver transplant status: Secondary | ICD-10-CM | POA: Diagnosis not present

## 2019-11-08 DIAGNOSIS — R198 Other specified symptoms and signs involving the digestive system and abdomen: Secondary | ICD-10-CM | POA: Diagnosis not present

## 2019-11-09 DIAGNOSIS — Z23 Encounter for immunization: Secondary | ICD-10-CM | POA: Diagnosis not present

## 2019-11-12 DIAGNOSIS — E119 Type 2 diabetes mellitus without complications: Secondary | ICD-10-CM | POA: Diagnosis not present

## 2019-11-12 DIAGNOSIS — E876 Hypokalemia: Secondary | ICD-10-CM | POA: Diagnosis not present

## 2019-11-12 DIAGNOSIS — E1165 Type 2 diabetes mellitus with hyperglycemia: Secondary | ICD-10-CM | POA: Diagnosis not present

## 2019-11-12 DIAGNOSIS — Z794 Long term (current) use of insulin: Secondary | ICD-10-CM | POA: Diagnosis not present

## 2019-11-18 DIAGNOSIS — L97919 Non-pressure chronic ulcer of unspecified part of right lower leg with unspecified severity: Secondary | ICD-10-CM | POA: Diagnosis not present

## 2019-11-18 DIAGNOSIS — Z6829 Body mass index (BMI) 29.0-29.9, adult: Secondary | ICD-10-CM | POA: Diagnosis not present

## 2019-11-18 DIAGNOSIS — I83019 Varicose veins of right lower extremity with ulcer of unspecified site: Secondary | ICD-10-CM | POA: Diagnosis not present

## 2019-12-04 DIAGNOSIS — E876 Hypokalemia: Secondary | ICD-10-CM | POA: Diagnosis not present

## 2019-12-04 DIAGNOSIS — Z944 Liver transplant status: Secondary | ICD-10-CM | POA: Diagnosis not present

## 2019-12-04 DIAGNOSIS — R7989 Other specified abnormal findings of blood chemistry: Secondary | ICD-10-CM | POA: Diagnosis not present

## 2019-12-04 DIAGNOSIS — Z4823 Encounter for aftercare following liver transplant: Secondary | ICD-10-CM | POA: Diagnosis not present

## 2019-12-04 DIAGNOSIS — R188 Other ascites: Secondary | ICD-10-CM | POA: Diagnosis not present

## 2019-12-04 DIAGNOSIS — R198 Other specified symptoms and signs involving the digestive system and abdomen: Secondary | ICD-10-CM | POA: Diagnosis not present

## 2019-12-04 DIAGNOSIS — J90 Pleural effusion, not elsewhere classified: Secondary | ICD-10-CM | POA: Diagnosis not present

## 2019-12-09 ENCOUNTER — Telehealth: Payer: Self-pay | Admitting: Cardiology

## 2019-12-09 NOTE — Telephone Encounter (Signed)
Pt will see Dr. Geraldo Pitter on Friday 12/13/19.

## 2019-12-09 NOTE — Telephone Encounter (Signed)
Anders is calling requesting an appointment with Dr. Harriet Masson so he can be seen sooner than December. Please advise.

## 2019-12-10 DIAGNOSIS — Z794 Long term (current) use of insulin: Secondary | ICD-10-CM | POA: Diagnosis not present

## 2019-12-10 DIAGNOSIS — Z79899 Other long term (current) drug therapy: Secondary | ICD-10-CM | POA: Diagnosis not present

## 2019-12-10 DIAGNOSIS — R600 Localized edema: Secondary | ICD-10-CM | POA: Diagnosis not present

## 2019-12-10 DIAGNOSIS — E1165 Type 2 diabetes mellitus with hyperglycemia: Secondary | ICD-10-CM | POA: Diagnosis not present

## 2019-12-10 DIAGNOSIS — R188 Other ascites: Secondary | ICD-10-CM | POA: Diagnosis not present

## 2019-12-10 DIAGNOSIS — D84821 Immunodeficiency due to drugs: Secondary | ICD-10-CM | POA: Diagnosis not present

## 2019-12-10 DIAGNOSIS — I5032 Chronic diastolic (congestive) heart failure: Secondary | ICD-10-CM | POA: Diagnosis not present

## 2019-12-10 DIAGNOSIS — I071 Rheumatic tricuspid insufficiency: Secondary | ICD-10-CM | POA: Diagnosis not present

## 2019-12-10 DIAGNOSIS — Z944 Liver transplant status: Secondary | ICD-10-CM | POA: Diagnosis not present

## 2019-12-10 DIAGNOSIS — Z538 Procedure and treatment not carried out for other reasons: Secondary | ICD-10-CM | POA: Diagnosis not present

## 2019-12-10 DIAGNOSIS — I110 Hypertensive heart disease with heart failure: Secondary | ICD-10-CM | POA: Diagnosis not present

## 2019-12-11 ENCOUNTER — Other Ambulatory Visit: Payer: Self-pay

## 2019-12-11 DIAGNOSIS — R188 Other ascites: Secondary | ICD-10-CM | POA: Diagnosis not present

## 2019-12-13 ENCOUNTER — Ambulatory Visit: Payer: Medicare Other | Admitting: Cardiology

## 2019-12-13 ENCOUNTER — Encounter: Payer: Self-pay | Admitting: Cardiology

## 2019-12-13 ENCOUNTER — Other Ambulatory Visit: Payer: Self-pay

## 2019-12-13 VITALS — BP 117/53 | HR 100 | Ht 70.5 in | Wt 209.6 lb

## 2019-12-13 DIAGNOSIS — N183 Chronic kidney disease, stage 3 unspecified: Secondary | ICD-10-CM

## 2019-12-13 DIAGNOSIS — E876 Hypokalemia: Secondary | ICD-10-CM

## 2019-12-13 DIAGNOSIS — Z944 Liver transplant status: Secondary | ICD-10-CM

## 2019-12-13 DIAGNOSIS — I10 Essential (primary) hypertension: Secondary | ICD-10-CM

## 2019-12-13 DIAGNOSIS — I5032 Chronic diastolic (congestive) heart failure: Secondary | ICD-10-CM

## 2019-12-13 NOTE — Addendum Note (Signed)
Addended by: Truddie Hidden on: 12/13/2019 01:45 PM   Modules accepted: Orders

## 2019-12-13 NOTE — Patient Instructions (Addendum)
Medication Instructions:  Your physician has recommended you make the following change in your medication:   Increase your potassium to 4 times daily until labwork on Monday 12/16/19.  *If you need a refill on your cardiac medications before your next appointment, please call your pharmacy*   Lab Work: None ordered If you have labs (blood work) drawn today and your tests are completely normal, you will receive your results only by: Marland Kitchen MyChart Message (if you have MyChart) OR . A paper copy in the mail If you have any lab test that is abnormal or we need to change your treatment, we will call you to review the results.   Testing/Procedures: None ordered   Follow-Up: At [facility], you and your health needs are our priority.  As part of our continuing mission to provide you with exceptional heart care, we have created designated Provider Care Teams.  These Care Teams include your primary Cardiologist (physician) and Advanced Practice Providers (APPs -  Physician Assistants and Nurse Practitioners) who all work together to provide you with the care you need, when you need it.  We recommend signing up for the patient portal called "MyChart".  Sign up information is provided on this After Visit Summary.  MyChart is used to connect with patients for Virtual Visits (Telemedicine).  Patients are able to view lab/test results, encounter notes, upcoming appointments, etc.  Non-urgent messages can be sent to your provider as well.   To learn more about what you can do with MyChart, go to [email or web address].    Your next appointment:   Keep your appointment with Dr. Bettina Gavia as scheduled.  The format for your next appointment:   In Person  Provider:   Shirlee More, MD   Other Instructions NA

## 2019-12-13 NOTE — Progress Notes (Signed)
Cardiology Office Note:    Date:  12/13/2019   ID:  Gregory Tanner, DOB 1955/12/11, MRN [id number]  PCP:  Ronita Hipps, MD  Cardiologist:  Jenean Lindau, MD   Referring MD: Ronita Hipps, MD    ASSESSMENT:    1. Benign essential hypertension   2. Stage 3 chronic kidney disease, unspecified whether stage 3a or 3b CKD ([place])   3. Chronic diastolic heart failure ([place])   4. History of liver transplant ([place])    PLAN:    In order of problems listed above:  1. Congestive diastolic heart failure: I discussed my findings with the patient extensively.  Echocardiogram was reviewed with the patient extensively.  Patient will continue current medications.  I do not see need to change any of his medications at this time. 2. Hypokalemia: The patient has significant hypokalemia.  He is wondering whether his hydralazine can be switched to spironolactone.  I think it is a good idea.  I told him to talk to his liver doctors to see if spironolactone would be okay medicine for him.  If it is then we will switch his medications and follow his electrolytes.  At this point he was advised to take potassium 20 mEq 4 times a day.  Currently he is doing it 3 times a day and his potassium is 3.0.  He will be back on Monday for a potassium check. 3. Essential hypertension: Blood pressure stable and he is doing well monitoring his weight. 4. He is post liver transplant and sees his doctors at [facility] on a regular basis and is very meticulous about compliance with advice. 5. He has an appointment with Dr. Bettina Gavia early December and he will keep that appointment.  Patient had multiple questions which were answered to his satisfaction.   Medication Adjustments/Labs and Tests Ordered: Current medicines are reviewed at length with the patient today.  Concerns regarding medicines are outlined above.  No orders of the defined types were placed in this encounter.  No orders of the defined types were placed in this  encounter.    No chief complaint on file.    History of Present Illness:    Gregory Tanner is a 64 y.o. male.  Patient has history of essential hypertension and liver transplant.  She has diastolic heart failure.  The patient went to [place] recently.  According to the history provided by the patient patient had significant ascites and underwent and underwent tapping.  He subsequently felt fine.  At that point he was told told to see his cardiologist.  Dr. Bettina Gavia is on vacation and so I got an opportunity to take care of him.  Patient denies any chest pain orthopnea or PND.  At the time of my evaluation, the patient is alert awake oriented and in no distress  Past Medical History:  Diagnosis Date  . Ankylosis of lumbar spine 02/21/2014  . Benign essential hypertension 10/26/2015  . BP (high blood pressure) 12/27/2010  . Chronic diastolic heart failure ([place]) 09/26/2016  . Chronic pain associated with significant psychosocial dysfunction 06/17/2015  . Chronic pain disorder 10/28/2015  . CKD (chronic kidney disease) stage 3, GFR 30-59 ml/min (HCC) 03/27/2017  . Degeneration of intervertebral disc of lumbar region 07/05/2013  . Degenerative disc disease, lumbar 07/26/2012  . Diabetes mellitus without complication ([place])   . Encounter for other specified prophylactic measures 10/10/2011  . Excess weight 10/10/2011  . Hearing loss   . Heart failure, diastolic, acute on  chronic ([place]) 07/24/2019  . Hepatocellular carcinoma ([place]) 12/27/2010   Overview:  Embolized 6/12   . History of liver transplant ([place]) 07/20/2011   Overview:  06/07/2011 for cryptogenic cirrhosis and HCC   . Hypertension   . Hypertensive heart disease with heart failure ([place]) 10/26/2015  . Hypokalemia   . Liver disease   . Lumbar radiculopathy 10/10/2012  . Nerve root pain 06/17/2015   Overview:  Right   . SOB (shortness of breath) on exertion 10/28/2015  . Swelling   . Type 2 diabetes mellitus ([place]) 12/27/2010   Overview:  H1C 6.5 12/2010    . Uncontrolled type 2 diabetes mellitus with insulin therapy ([place]) 06/21/2018    Past Surgical History:  Procedure Laterality Date  . BACK SURGERY    . BAKER CYST SURGERY     RIGHT LEG  . EYE SURGERY Left   . HERNIA REPAIR    . LIVER SURGERY     IMPLANT  . LIVER TRANSPLANT    . TONSILL SURGERY      Current Medications: Current Meds  Medication Sig  . allopurinol (ZYLOPRIM) 300 MG tablet Take 150 mg by mouth daily.   Marland Kitchen aspirin 81 MG tablet Take 81 mg by mouth daily.  . bumetanide (BUMEX) 2 MG tablet Take 1.5 tablets (3 mg total ) by mouth daily in the morning, and one tablet (2 mg total) by mouth daily in the evening.  Marland Kitchen gemfibrozil (LOPID) 600 MG tablet TAKE 1/2 TABLET BY MOUTH TWICE (2) DAILY BEFORE A MEAL  . hydrALAZINE (APRESOLINE) 25 MG tablet TAKE 1/2 TABLET BY MOUTH 3 TIMES DAILY IF YOUR SYSTOLIC BLOOD PRESSURE (TOP NUMBER) IS GREATER THAN 140  . insulin regular human CONCENTRATED (HUMULIN R U-500 KWIKPEN) 500 UNIT/ML kwikpen Inject 50-110 Units into the skin 3 (three) times daily with meals. Inject 105 units with breakfast, 50-60 units with midday meal depending on the meal, and 100 units with dinner  . metolazone (ZAROXOLYN) 5 MG tablet Take 5 mg by mouth 2 (two) times a week.  . Multiple Vitamin (MULTIVITAMIN) tablet Take 1 tablet by mouth daily.  . mycophenolate (CELLCEPT) 250 MG capsule Take 250 mg by mouth 3 (three) times daily.  . tacrolimus (PROGRAF) 0.5 MG capsule Take 0.5 capsules by mouth See admin instructions. Takes 1 capsule in the am, and 1 capsule at night every other night     Allergies:   Iodinated diagnostic agents, Latex, Metformin and related, and Rosiglitazone   Social History   Socioeconomic History  . Marital status: Married    Spouse name: Not on file  . Number of children: Not on file  . Years of education: Not on file  . Highest education level: Not on file  Occupational History  . Not on file  Tobacco Use  . Smoking status: Never Smoker    . Smokeless tobacco: Never Used  Vaping Use  . Vaping Use: Never used  Substance and Sexual Activity  . Alcohol use: No    Alcohol/week: 0.0 standard drinks  . Drug use: No  . Sexual activity: Not on file  Other Topics Concern  . Not on file  Social History Narrative  . Not on file   Social Determinants of Health   Financial Resource Strain:   . Difficulty of Paying Living Expenses: Not on file  Food Insecurity:   . Worried About Charity fundraiser in the Last Year: Not on file  . Ran Out of Food in the Last Year:  Not on file  Transportation Needs:   . Lack of Transportation (Medical): Not on file  . Lack of Transportation (Non-Medical): Not on file  Physical Activity:   . Days of Exercise per Week: Not on file  . Minutes of Exercise per Session: Not on file  Stress:   . Feeling of Stress : Not on file  Social Connections:   . Frequency of Communication with Friends and Family: Not on file  . Frequency of Social Gatherings with Friends and Family: Not on file  . Attends Religious Services: Not on file  . Active Member of Clubs or Organizations: Not on file  . Attends Archivist Meetings: Not on file  . Marital Status: Not on file     Family History: The patient's family history includes Cancer in his mother; Diabetes in his father; Heart attack in his father; Hypertension in his brother.  ROS:   Please see the history of present illness.    All other systems reviewed and are negative.  EKGs/Labs/Other Studies Reviewed:    The following studies were reviewed today: INTERPRETATION --------------------------------------------------------------- NORMAL LEFT VENTRICULAR SYSTOLIC FUNCTION NORMAL LA PRESSURES WITH NORMAL DIASTOLIC FUNCTION MODERATE RV SYSTOLIC DYSFUNCTION (See above) VALVULAR REGURGITATION: TRIVIAL AR, TRIVIAL MR, MODERATE TR VALVULAR STENOSIS: TRIVIAL MS  Compared with prior Echo study on 12/10/2010: MODERATELY REDUCED RV FUNCTION.  ESTIMATED RVSP = 53mmHg.    (Report version 3.0) Interpreted and Electronically signed Perform. by: Marilynn Latino, RDCS by: Ignatius Specking, MD Resp.Person: Marilynn Latino, RDCS On: 12/10/2019 17:07:07   Recent Labs: 07/24/2019: ALT 17; B Natriuretic Peptide 292.1 07/28/2019: Hemoglobin 14.5; Platelets 100 08/29/2019: BUN 82; Creatinine, Ser 2.04; Magnesium 2.3; NT-Pro BNP 1,897; Potassium 3.3; Sodium 137  Recent Lipid Panel    Component Value Date/Time   CHOL 87 (L) 08/30/2018 0842   TRIG 192 (H) 08/30/2018 0842   HDL 24 (L) 08/30/2018 0842   CHOLHDL 3.6 08/30/2018 0842   LDLCALC 25 08/30/2018 0842    Physical Exam:    VS:  BP (!) 117/53   Pulse 100   Ht 5' 10.5" (1.791 m)   Wt 209 lb 9.6 oz (95.1 kg)   SpO2 96%   BMI 29.65 kg/m     Wt Readings from Last 3 Encounters:  12/13/19 209 lb 9.6 oz (95.1 kg)  10/16/19 212 lb (96.2 kg)  08/29/19 212 lb 9.6 oz (96.4 kg)     GEN: Patient is in no acute distress HEENT: Normal NECK: No JVD; No carotid bruits LYMPHATICS: No lymphadenopathy CARDIAC: Hear sounds regular, 2/6 systolic murmur at the apex. RESPIRATORY:  Clear to auscultation without rales, wheezing or rhonchi  ABDOMEN: Soft, non-tender, non-distended MUSCULOSKELETAL:  No edema; No deformity  SKIN: Warm and dry NEUROLOGIC:  Alert and oriented x 3 PSYCHIATRIC:  Normal affect   Signed, Jenean Lindau, MD  12/13/2019 1:26 PM    [facility]

## 2019-12-16 DIAGNOSIS — N183 Chronic kidney disease, stage 3 unspecified: Secondary | ICD-10-CM | POA: Diagnosis not present

## 2019-12-16 DIAGNOSIS — E876 Hypokalemia: Secondary | ICD-10-CM | POA: Diagnosis not present

## 2019-12-16 DIAGNOSIS — I5032 Chronic diastolic (congestive) heart failure: Secondary | ICD-10-CM | POA: Diagnosis not present

## 2019-12-16 LAB — BASIC METABOLIC PANEL
BUN/Creatinine Ratio: 39 — ABNORMAL HIGH (ref 10–24)
BUN: 75 mg/dL — ABNORMAL HIGH (ref 8–27)
CO2: 26 mmol/L (ref 20–29)
Calcium: 9.6 mg/dL (ref 8.6–10.2)
Chloride: 95 mmol/L — ABNORMAL LOW (ref 96–106)
Creatinine, Ser: 1.92 mg/dL — ABNORMAL HIGH (ref 0.76–1.27)
GFR calc Af Amer: 41 mL/min/{1.73_m2} — ABNORMAL LOW (ref >59)
GFR calc non Af Amer: 35 mL/min/{1.73_m2} — ABNORMAL LOW (ref >59)
Glucose: 104 mg/dL — ABNORMAL HIGH (ref 65–99)
Potassium: 3.6 mmol/L (ref 3.5–5.2)
Sodium: 138 mmol/L (ref 134–144)

## 2020-01-14 DIAGNOSIS — I50813 Acute on chronic right heart failure: Secondary | ICD-10-CM | POA: Diagnosis not present

## 2020-01-14 DIAGNOSIS — R9431 Abnormal electrocardiogram [ECG] [EKG]: Secondary | ICD-10-CM | POA: Diagnosis not present

## 2020-01-14 DIAGNOSIS — E8779 Other fluid overload: Secondary | ICD-10-CM | POA: Diagnosis not present

## 2020-01-14 DIAGNOSIS — R188 Other ascites: Secondary | ICD-10-CM | POA: Diagnosis not present

## 2020-01-14 DIAGNOSIS — I50810 Right heart failure, unspecified: Secondary | ICD-10-CM | POA: Diagnosis not present

## 2020-01-14 DIAGNOSIS — D849 Immunodeficiency, unspecified: Secondary | ICD-10-CM | POA: Diagnosis not present

## 2020-01-14 DIAGNOSIS — E1165 Type 2 diabetes mellitus with hyperglycemia: Secondary | ICD-10-CM | POA: Diagnosis not present

## 2020-01-14 DIAGNOSIS — M109 Gout, unspecified: Secondary | ICD-10-CM | POA: Diagnosis not present

## 2020-01-14 DIAGNOSIS — E119 Type 2 diabetes mellitus without complications: Secondary | ICD-10-CM | POA: Diagnosis not present

## 2020-01-14 DIAGNOSIS — I071 Rheumatic tricuspid insufficiency: Secondary | ICD-10-CM | POA: Diagnosis not present

## 2020-01-14 DIAGNOSIS — I5033 Acute on chronic diastolic (congestive) heart failure: Secondary | ICD-10-CM | POA: Diagnosis not present

## 2020-01-14 DIAGNOSIS — I10 Essential (primary) hypertension: Secondary | ICD-10-CM | POA: Diagnosis not present

## 2020-01-14 DIAGNOSIS — Z20822 Contact with and (suspected) exposure to covid-19: Secondary | ICD-10-CM | POA: Diagnosis not present

## 2020-01-14 DIAGNOSIS — Q211 Atrial septal defect: Secondary | ICD-10-CM | POA: Diagnosis not present

## 2020-01-14 DIAGNOSIS — I5082 Biventricular heart failure: Secondary | ICD-10-CM | POA: Diagnosis not present

## 2020-01-14 DIAGNOSIS — N183 Chronic kidney disease, stage 3 unspecified: Secondary | ICD-10-CM | POA: Diagnosis not present

## 2020-01-14 DIAGNOSIS — I2729 Other secondary pulmonary hypertension: Secondary | ICD-10-CM | POA: Diagnosis not present

## 2020-01-14 DIAGNOSIS — I272 Pulmonary hypertension, unspecified: Secondary | ICD-10-CM | POA: Diagnosis not present

## 2020-01-14 DIAGNOSIS — I130 Hypertensive heart and chronic kidney disease with heart failure and stage 1 through stage 4 chronic kidney disease, or unspecified chronic kidney disease: Secondary | ICD-10-CM | POA: Diagnosis not present

## 2020-01-14 DIAGNOSIS — E876 Hypokalemia: Secondary | ICD-10-CM | POA: Diagnosis not present

## 2020-01-14 DIAGNOSIS — I872 Venous insufficiency (chronic) (peripheral): Secondary | ICD-10-CM | POA: Diagnosis not present

## 2020-01-14 DIAGNOSIS — I110 Hypertensive heart disease with heart failure: Secondary | ICD-10-CM | POA: Diagnosis not present

## 2020-01-14 DIAGNOSIS — Z944 Liver transplant status: Secondary | ICD-10-CM | POA: Diagnosis not present

## 2020-01-14 DIAGNOSIS — E785 Hyperlipidemia, unspecified: Secondary | ICD-10-CM | POA: Diagnosis not present

## 2020-01-14 DIAGNOSIS — E1122 Type 2 diabetes mellitus with diabetic chronic kidney disease: Secondary | ICD-10-CM | POA: Diagnosis not present

## 2020-01-16 ENCOUNTER — Ambulatory Visit: Payer: Medicare Other | Admitting: Cardiology

## 2020-01-19 DIAGNOSIS — I272 Pulmonary hypertension, unspecified: Secondary | ICD-10-CM | POA: Insufficient documentation

## 2020-01-22 DIAGNOSIS — Z79899 Other long term (current) drug therapy: Secondary | ICD-10-CM | POA: Diagnosis not present

## 2020-01-22 DIAGNOSIS — N289 Disorder of kidney and ureter, unspecified: Secondary | ICD-10-CM | POA: Insufficient documentation

## 2020-01-22 DIAGNOSIS — E876 Hypokalemia: Secondary | ICD-10-CM | POA: Diagnosis not present

## 2020-01-22 DIAGNOSIS — E8770 Fluid overload, unspecified: Secondary | ICD-10-CM | POA: Insufficient documentation

## 2020-01-22 DIAGNOSIS — Z944 Liver transplant status: Secondary | ICD-10-CM | POA: Diagnosis not present

## 2020-01-28 DIAGNOSIS — I50810 Right heart failure, unspecified: Secondary | ICD-10-CM | POA: Diagnosis not present

## 2020-01-28 DIAGNOSIS — I10 Essential (primary) hypertension: Secondary | ICD-10-CM | POA: Diagnosis not present

## 2020-01-30 DIAGNOSIS — I50810 Right heart failure, unspecified: Secondary | ICD-10-CM | POA: Diagnosis not present

## 2020-02-04 ENCOUNTER — Encounter: Payer: Self-pay | Admitting: Allergy

## 2020-02-04 ENCOUNTER — Other Ambulatory Visit: Payer: Self-pay

## 2020-02-04 ENCOUNTER — Ambulatory Visit: Payer: Medicare Other | Admitting: Allergy

## 2020-02-04 VITALS — BP 128/52 | HR 92 | Resp 18 | Ht 70.5 in | Wt 212.0 lb

## 2020-02-04 DIAGNOSIS — H1013 Acute atopic conjunctivitis, bilateral: Secondary | ICD-10-CM | POA: Diagnosis not present

## 2020-02-04 DIAGNOSIS — J301 Allergic rhinitis due to pollen: Secondary | ICD-10-CM | POA: Diagnosis not present

## 2020-02-04 DIAGNOSIS — Z944 Liver transplant status: Secondary | ICD-10-CM | POA: Diagnosis not present

## 2020-02-04 MED ORDER — TRIAMCINOLONE ACETONIDE 0.1 % EX OINT: TOPICAL_OINTMENT | CUTANEOUS | 5 refills | Status: DC

## 2020-02-04 MED ORDER — AZELASTINE HCL 0.1 % NA SOLN: NASAL | 12 refills | Status: DC

## 2020-02-04 NOTE — Patient Instructions (Addendum)
-  Environmental allergy testing is positive to pollens (grass, weed and tree pollen) -Allergen avoidance measures discussed/handouts provided -Start nasal antihistamine, Astelin 2 sprays each nostril twice a day.  Use with proper nasal spray technique by pointing the tip of the nostril toward the eye of the same side nostril -Try oral antihistamine, fexofenadine (Allegra) 180 mg 1 tablet daily -During spring season for itchy or watery eyes can use over-the-counter Pataday 1 drop each eye daily as needed  -For rash on chest can try triamcinolone 0.1% ointment apply thin layer twice a day until the rash is improved do not use more than 2 weeks at a time. -Keep skin moisturized after bathing with a thick emollients like Eucerin, CeraVe, Cetaphil, Aquaphor, Vaseline  Follow-up in 4 months or sooner if needed

## 2020-02-04 NOTE — Progress Notes (Signed)
New Patient Note  RE: AMIIR Tanner MRN: [id number] DOB: [birth year] Date of Office Visit: 02/04/2020  Referring provider: Ronita Hipps, MD Primary care provider: Ronita Hipps, MD  Chief Complaint: runny nose  History of present illness: Gregory Tanner is a 64 y.o. male presenting today for consultation for allergies.  He reports constant post-nasal drip with throat clearing and also reports will gag and sometimes vomit.  This is worse in the mornings and night and levels off during the day.  Symptoms are year-round.  He states the symptoms have been ongoing well before Covid was identified; thus has been at least the past 2 years or so.  He does report some itchy/watery eyes during spring time as well. Has tried zyrtec until the bottle ran out about 6 months ago and states was not helpful.  Has also tried mucinex that wasn't helpful.  Has tried over-the-counter flonase also not helpful.  Has had 2 rounds of antibiotics for this as well that also did not help. He also reports a rash across his chest for the same period of time he's had the drainage.  It doesn't go away.  It itches a little bit.  Has not tried any topical therapies.  He states his liver transplant medications have not changed since he started after the transplant in 2013.  He follows with his transplant doctors yearly.   He states he did have asthma as a child but no issues in adulthood.  No food allergy or eczema.     Review of systems:  Review of Systems  Constitutional: Negative.   HENT:       See HPI  Eyes: Negative.   Respiratory: Negative.   Cardiovascular: Negative.   Gastrointestinal: Negative.   Musculoskeletal: Negative.   Skin: Positive for itching and rash.  Neurological: Negative.     All other systems negative unless noted above in HPI  Past medical history: Past Medical History:  Diagnosis Date  . Ankylosis of lumbar spine 02/21/2014  . Benign essential hypertension 10/26/2015  . BP (high  blood pressure) 12/27/2010  . Chronic diastolic heart failure ([place]) 09/26/2016  . Chronic pain associated with significant psychosocial dysfunction 06/17/2015  . Chronic pain disorder 10/28/2015  . CKD (chronic kidney disease) stage 3, GFR 30-59 ml/min (HCC) 03/27/2017  . Degeneration of intervertebral disc of lumbar region 07/05/2013  . Degenerative disc disease, lumbar 07/26/2012  . Diabetes mellitus without complication ([place])   . Encounter for other specified prophylactic measures 10/10/2011  . Excess weight 10/10/2011  . Hearing loss   . Heart failure, diastolic, acute on chronic ([place]) 07/24/2019  . Hepatocellular carcinoma ([place]) 12/27/2010   Overview:  Embolized 6/12   . History of liver transplant ([place]) 07/20/2011   Overview:  06/07/2011 for cryptogenic cirrhosis and HCC   . Hypertension   . Hypertensive heart disease with heart failure ([place]) 10/26/2015  . Hypokalemia   . Liver disease   . Lumbar radiculopathy 10/10/2012  . Nerve root pain 06/17/2015   Overview:  Right   . SOB (shortness of breath) on exertion 10/28/2015  . Swelling   . Type 2 diabetes mellitus ([place]) 12/27/2010   Overview:  H1C 6.5 12/2010   . Uncontrolled type 2 diabetes mellitus with insulin therapy ([place]) 06/21/2018    Past surgical history: Past Surgical History:  Procedure Laterality Date  . BACK SURGERY    . BAKER CYST SURGERY     RIGHT LEG  . EYE SURGERY Left   .  HERNIA REPAIR    . LIVER SURGERY     IMPLANT  . LIVER TRANSPLANT    . TONSILL SURGERY    . TONSILLECTOMY      Family history:  Family History  Problem Relation Age of Onset  . Cancer Mother   . Diabetes Father   . Heart attack Father   . Schizophrenia Father   . Hypertension Brother     Social history: Lives in a home with carpeting with gas heating and central cooling.  No pets in the home.  There is no concern for water damage, mildew or roaches in the home.  He has been retired for the past 8 years.  He has no smoking history.  Medication  List: Current Outpatient Medications  Medication Sig Dispense Refill  . allopurinol (ZYLOPRIM) 300 MG tablet Take 150 mg by mouth daily.   3  . aspirin 81 MG tablet Take 81 mg by mouth daily.    . bumetanide (BUMEX) 2 MG tablet Take 1.5 tablets (3 mg total ) by mouth daily in the morning, and one tablet (2 mg total) by mouth daily in the evening. 120 tablet 3  . gemfibrozil (LOPID) 600 MG tablet TAKE 1/2 TABLET BY MOUTH TWICE (2) DAILY BEFORE A MEAL 60 tablet 2  . hydrALAZINE (APRESOLINE) 25 MG tablet TAKE 1/2 TABLET BY MOUTH 3 TIMES DAILY IF YOUR SYSTOLIC BLOOD PRESSURE (TOP NUMBER) IS GREATER THAN 140 45 tablet 5  . JARDIANCE 10 MG TABS tablet Take 10 mg by mouth daily.    . metolazone (ZAROXOLYN) 5 MG tablet Take 5 mg by mouth 2 (two) times a week.    . Multiple Vitamin (MULTIVITAMIN) tablet Take 1 tablet by mouth daily.    . mycophenolate (CELLCEPT) 250 MG capsule Take 250 mg by mouth 3 (three) times daily.    . potassium chloride SA (KLOR-CON) 20 MEQ tablet Take 20 mEq by mouth 2 (two) times daily.    Marland Kitchen spironolactone (ALDACTONE) 25 MG tablet Take 12.5 mg by mouth daily.    . tacrolimus (PROGRAF) 0.5 MG capsule Take 0.5 capsules by mouth See admin instructions. Takes 1 capsule in the am, and 1 capsule at night every other night    . azelastine (ASTELIN) 0.1 % nasal spray Use two sprays in each nostril twice daily as directed. 30 mL 12  . insulin regular human CONCENTRATED (HUMULIN R U-500 KWIKPEN) 500 UNIT/ML kwikpen Inject 50-110 Units into the skin 3 (three) times daily with meals. Inject 105 units with breakfast, 50-60 units with midday meal depending on the meal, and 100 units with dinner (Patient not taking: Reported on 02/04/2020)    . triamcinolone ointment (KENALOG) 0.1 % Can apply thin layer to rash on chest twice daily until improved.  Do not use more than 2 weeks at a time. 30 g 5   No current facility-administered medications for this visit.    Known medication  allergies: Allergies  Allergen Reactions  . Iodinated Diagnostic Agents Hives and Other (See Comments)    Allergy is not to all contrast - but patient is unsure of which particular one his allergy is to. Allergy is not to all contrast - but patient is unsure of which particular one his allergy is to. Contrast dye used before liver transplant cause hives, not sure which dye this was but is able to use others  . Latex Other (See Comments)    Skin peeling  . Metformin And Related Nausea And Vomiting  .  Rosiglitazone Nausea And Vomiting    GI effects and abdominal pain     Physical examination: Blood pressure (!) 128/52, pulse 92, resp. rate 18, height 5' 10.5" (1.791 m), weight 212 lb (96.2 kg), SpO2 95 %.  General: Alert, interactive, in no acute distress. HEENT: PERRLA, TMs pearly gray, turbinates minimally edematous with clear discharge, post-pharynx non erythematous with visible mucus drainage. Neck: Supple without lymphadenopathy. Lungs: Clear to auscultation without wheezing, rhonchi or rales. {no increased work of breathing. CV: Normal S1, S2 without murmurs. Abdomen: Nondistended, nontender. Skin: Slightly erythematous rash with slight scale and patches across the upper chest. Extremities:  No clubbing, cyanosis or edema. Neuro:   Grossly intact.  Diagnositics/Labs: Allergy testing: Environmental allergy testing was positive to [place], [place] planting in [place]. Allergy testing results were read and interpreted by provider, documented by clinical staff.   Assessment and plan: Allergic rhinitis with conjunctivitis  -Environmental allergy testing is positive to pollens (grass, weed and tree pollen) -Allergen avoidance measures discussed/handouts provided -Start nasal antihistamine, Astelin 2 sprays each nostril twice a day.  Use with proper nasal spray technique by pointing the tip of the nostril toward the eye of the same side nostril -Try oral antihistamine, fexofenadine  (Allegra) 180 mg 1 tablet daily -During spring season for itchy or watery eyes can use over-the-counter Pataday 1 drop each eye daily as needed  -For rash on chest can try triamcinolone 0.1% ointment apply thin layer twice a day until the rash is improved do not use more than 2 weeks at a time. -Keep skin moisturized after bathing with a thick emollients like Eucerin, CeraVe, Cetaphil, Aquaphor, Vaseline  Follow-up in 4 months or sooner if needed  I appreciate the opportunity to take part in [place]'s care. Please do not hesitate to contact me with questions.  Sincerely,   Prudy Feeler, MD Allergy/Immunology [facility]

## 2020-02-05 DIAGNOSIS — I83019 Varicose veins of right lower extremity with ulcer of unspecified site: Secondary | ICD-10-CM | POA: Diagnosis not present

## 2020-02-05 DIAGNOSIS — I50810 Right heart failure, unspecified: Secondary | ICD-10-CM | POA: Diagnosis not present

## 2020-02-05 DIAGNOSIS — L97919 Non-pressure chronic ulcer of unspecified part of right lower leg with unspecified severity: Secondary | ICD-10-CM | POA: Diagnosis not present

## 2020-02-05 DIAGNOSIS — Z6829 Body mass index (BMI) 29.0-29.9, adult: Secondary | ICD-10-CM | POA: Diagnosis not present

## 2020-02-10 DIAGNOSIS — I50810 Right heart failure, unspecified: Secondary | ICD-10-CM | POA: Diagnosis not present

## 2020-02-18 DIAGNOSIS — I50810 Right heart failure, unspecified: Secondary | ICD-10-CM | POA: Diagnosis not present

## 2020-02-18 DIAGNOSIS — I10 Essential (primary) hypertension: Secondary | ICD-10-CM | POA: Diagnosis not present

## 2020-02-20 DIAGNOSIS — E876 Hypokalemia: Secondary | ICD-10-CM | POA: Diagnosis not present

## 2020-02-21 ENCOUNTER — Other Ambulatory Visit: Payer: Self-pay | Admitting: Cardiology

## 2020-02-26 DIAGNOSIS — Z7982 Long term (current) use of aspirin: Secondary | ICD-10-CM | POA: Diagnosis not present

## 2020-02-26 DIAGNOSIS — L97812 Non-pressure chronic ulcer of other part of right lower leg with fat layer exposed: Secondary | ICD-10-CM | POA: Diagnosis not present

## 2020-02-26 DIAGNOSIS — Z9181 History of falling: Secondary | ICD-10-CM | POA: Diagnosis not present

## 2020-02-26 DIAGNOSIS — Z7984 Long term (current) use of oral hypoglycemic drugs: Secondary | ICD-10-CM | POA: Diagnosis not present

## 2020-02-26 DIAGNOSIS — Z944 Liver transplant status: Secondary | ICD-10-CM | POA: Diagnosis not present

## 2020-02-26 DIAGNOSIS — N183 Chronic kidney disease, stage 3 unspecified: Secondary | ICD-10-CM | POA: Diagnosis not present

## 2020-02-26 DIAGNOSIS — E1122 Type 2 diabetes mellitus with diabetic chronic kidney disease: Secondary | ICD-10-CM | POA: Diagnosis not present

## 2020-02-26 DIAGNOSIS — L97822 Non-pressure chronic ulcer of other part of left lower leg with fat layer exposed: Secondary | ICD-10-CM | POA: Diagnosis not present

## 2020-02-26 DIAGNOSIS — Z79899 Other long term (current) drug therapy: Secondary | ICD-10-CM | POA: Diagnosis not present

## 2020-02-26 DIAGNOSIS — Z794 Long term (current) use of insulin: Secondary | ICD-10-CM | POA: Diagnosis not present

## 2020-02-26 DIAGNOSIS — I50810 Right heart failure, unspecified: Secondary | ICD-10-CM | POA: Diagnosis not present

## 2020-02-26 DIAGNOSIS — I272 Pulmonary hypertension, unspecified: Secondary | ICD-10-CM | POA: Diagnosis not present

## 2020-02-26 DIAGNOSIS — I130 Hypertensive heart and chronic kidney disease with heart failure and stage 1 through stage 4 chronic kidney disease, or unspecified chronic kidney disease: Secondary | ICD-10-CM | POA: Diagnosis not present

## 2020-03-03 DIAGNOSIS — Z9181 History of falling: Secondary | ICD-10-CM | POA: Diagnosis not present

## 2020-03-03 DIAGNOSIS — Z944 Liver transplant status: Secondary | ICD-10-CM | POA: Diagnosis not present

## 2020-03-03 DIAGNOSIS — Z794 Long term (current) use of insulin: Secondary | ICD-10-CM | POA: Diagnosis not present

## 2020-03-03 DIAGNOSIS — I50810 Right heart failure, unspecified: Secondary | ICD-10-CM | POA: Diagnosis not present

## 2020-03-03 DIAGNOSIS — N183 Chronic kidney disease, stage 3 unspecified: Secondary | ICD-10-CM | POA: Diagnosis not present

## 2020-03-03 DIAGNOSIS — Z7982 Long term (current) use of aspirin: Secondary | ICD-10-CM | POA: Diagnosis not present

## 2020-03-03 DIAGNOSIS — Z7984 Long term (current) use of oral hypoglycemic drugs: Secondary | ICD-10-CM | POA: Diagnosis not present

## 2020-03-03 DIAGNOSIS — L97812 Non-pressure chronic ulcer of other part of right lower leg with fat layer exposed: Secondary | ICD-10-CM | POA: Diagnosis not present

## 2020-03-03 DIAGNOSIS — L97822 Non-pressure chronic ulcer of other part of left lower leg with fat layer exposed: Secondary | ICD-10-CM | POA: Diagnosis not present

## 2020-03-03 DIAGNOSIS — I272 Pulmonary hypertension, unspecified: Secondary | ICD-10-CM | POA: Diagnosis not present

## 2020-03-03 DIAGNOSIS — I130 Hypertensive heart and chronic kidney disease with heart failure and stage 1 through stage 4 chronic kidney disease, or unspecified chronic kidney disease: Secondary | ICD-10-CM | POA: Diagnosis not present

## 2020-03-03 DIAGNOSIS — Z79899 Other long term (current) drug therapy: Secondary | ICD-10-CM | POA: Diagnosis not present

## 2020-03-03 DIAGNOSIS — E1122 Type 2 diabetes mellitus with diabetic chronic kidney disease: Secondary | ICD-10-CM | POA: Diagnosis not present

## 2020-03-04 DIAGNOSIS — L97812 Non-pressure chronic ulcer of other part of right lower leg with fat layer exposed: Secondary | ICD-10-CM | POA: Diagnosis not present

## 2020-03-04 DIAGNOSIS — Z9181 History of falling: Secondary | ICD-10-CM | POA: Diagnosis not present

## 2020-03-04 DIAGNOSIS — I272 Pulmonary hypertension, unspecified: Secondary | ICD-10-CM | POA: Diagnosis not present

## 2020-03-04 DIAGNOSIS — Z794 Long term (current) use of insulin: Secondary | ICD-10-CM | POA: Diagnosis not present

## 2020-03-04 DIAGNOSIS — N183 Chronic kidney disease, stage 3 unspecified: Secondary | ICD-10-CM | POA: Diagnosis not present

## 2020-03-04 DIAGNOSIS — E1122 Type 2 diabetes mellitus with diabetic chronic kidney disease: Secondary | ICD-10-CM | POA: Diagnosis not present

## 2020-03-04 DIAGNOSIS — Z7984 Long term (current) use of oral hypoglycemic drugs: Secondary | ICD-10-CM | POA: Diagnosis not present

## 2020-03-04 DIAGNOSIS — I130 Hypertensive heart and chronic kidney disease with heart failure and stage 1 through stage 4 chronic kidney disease, or unspecified chronic kidney disease: Secondary | ICD-10-CM | POA: Diagnosis not present

## 2020-03-04 DIAGNOSIS — Z79899 Other long term (current) drug therapy: Secondary | ICD-10-CM | POA: Diagnosis not present

## 2020-03-04 DIAGNOSIS — Z7982 Long term (current) use of aspirin: Secondary | ICD-10-CM | POA: Diagnosis not present

## 2020-03-04 DIAGNOSIS — Z944 Liver transplant status: Secondary | ICD-10-CM | POA: Diagnosis not present

## 2020-03-04 DIAGNOSIS — L97822 Non-pressure chronic ulcer of other part of left lower leg with fat layer exposed: Secondary | ICD-10-CM | POA: Diagnosis not present

## 2020-03-04 DIAGNOSIS — I50810 Right heart failure, unspecified: Secondary | ICD-10-CM | POA: Diagnosis not present

## 2020-03-05 DIAGNOSIS — I130 Hypertensive heart and chronic kidney disease with heart failure and stage 1 through stage 4 chronic kidney disease, or unspecified chronic kidney disease: Secondary | ICD-10-CM | POA: Diagnosis not present

## 2020-03-09 ENCOUNTER — Other Ambulatory Visit: Payer: Self-pay | Admitting: Cardiology

## 2020-03-09 DIAGNOSIS — N289 Disorder of kidney and ureter, unspecified: Secondary | ICD-10-CM | POA: Diagnosis not present

## 2020-03-09 NOTE — Telephone Encounter (Signed)
Refill sent to pharmacy.   

## 2020-03-09 NOTE — Telephone Encounter (Signed)
Had to call in due to system being down.

## 2020-03-11 DIAGNOSIS — L97812 Non-pressure chronic ulcer of other part of right lower leg with fat layer exposed: Secondary | ICD-10-CM | POA: Diagnosis not present

## 2020-03-11 DIAGNOSIS — Z79899 Other long term (current) drug therapy: Secondary | ICD-10-CM | POA: Diagnosis not present

## 2020-03-11 DIAGNOSIS — L97822 Non-pressure chronic ulcer of other part of left lower leg with fat layer exposed: Secondary | ICD-10-CM | POA: Diagnosis not present

## 2020-03-11 DIAGNOSIS — I50810 Right heart failure, unspecified: Secondary | ICD-10-CM | POA: Diagnosis not present

## 2020-03-11 DIAGNOSIS — I272 Pulmonary hypertension, unspecified: Secondary | ICD-10-CM | POA: Diagnosis not present

## 2020-03-11 DIAGNOSIS — Z7982 Long term (current) use of aspirin: Secondary | ICD-10-CM | POA: Diagnosis not present

## 2020-03-11 DIAGNOSIS — N183 Chronic kidney disease, stage 3 unspecified: Secondary | ICD-10-CM | POA: Diagnosis not present

## 2020-03-11 DIAGNOSIS — I130 Hypertensive heart and chronic kidney disease with heart failure and stage 1 through stage 4 chronic kidney disease, or unspecified chronic kidney disease: Secondary | ICD-10-CM | POA: Diagnosis not present

## 2020-03-11 DIAGNOSIS — Z9181 History of falling: Secondary | ICD-10-CM | POA: Diagnosis not present

## 2020-03-11 DIAGNOSIS — E1122 Type 2 diabetes mellitus with diabetic chronic kidney disease: Secondary | ICD-10-CM | POA: Diagnosis not present

## 2020-03-11 DIAGNOSIS — Z794 Long term (current) use of insulin: Secondary | ICD-10-CM | POA: Diagnosis not present

## 2020-03-11 DIAGNOSIS — Z7984 Long term (current) use of oral hypoglycemic drugs: Secondary | ICD-10-CM | POA: Diagnosis not present

## 2020-03-11 DIAGNOSIS — Z944 Liver transplant status: Secondary | ICD-10-CM | POA: Diagnosis not present

## 2020-03-18 DIAGNOSIS — I2729 Other secondary pulmonary hypertension: Secondary | ICD-10-CM | POA: Diagnosis not present

## 2020-03-18 DIAGNOSIS — I272 Pulmonary hypertension, unspecified: Secondary | ICD-10-CM | POA: Diagnosis not present

## 2020-03-18 DIAGNOSIS — I50810 Right heart failure, unspecified: Secondary | ICD-10-CM | POA: Diagnosis not present

## 2020-03-18 DIAGNOSIS — Z944 Liver transplant status: Secondary | ICD-10-CM | POA: Diagnosis not present

## 2020-03-18 DIAGNOSIS — R188 Other ascites: Secondary | ICD-10-CM | POA: Diagnosis not present

## 2020-03-18 DIAGNOSIS — R161 Splenomegaly, not elsewhere classified: Secondary | ICD-10-CM | POA: Diagnosis not present

## 2020-03-18 DIAGNOSIS — E8771 Transfusion associated circulatory overload: Secondary | ICD-10-CM | POA: Diagnosis not present

## 2020-03-19 DIAGNOSIS — L97822 Non-pressure chronic ulcer of other part of left lower leg with fat layer exposed: Secondary | ICD-10-CM | POA: Diagnosis not present

## 2020-03-19 DIAGNOSIS — E1122 Type 2 diabetes mellitus with diabetic chronic kidney disease: Secondary | ICD-10-CM | POA: Diagnosis not present

## 2020-03-19 DIAGNOSIS — I130 Hypertensive heart and chronic kidney disease with heart failure and stage 1 through stage 4 chronic kidney disease, or unspecified chronic kidney disease: Secondary | ICD-10-CM | POA: Diagnosis not present

## 2020-03-19 DIAGNOSIS — N183 Chronic kidney disease, stage 3 unspecified: Secondary | ICD-10-CM | POA: Diagnosis not present

## 2020-03-19 DIAGNOSIS — Z794 Long term (current) use of insulin: Secondary | ICD-10-CM | POA: Diagnosis not present

## 2020-03-19 DIAGNOSIS — I272 Pulmonary hypertension, unspecified: Secondary | ICD-10-CM | POA: Diagnosis not present

## 2020-03-19 DIAGNOSIS — Z944 Liver transplant status: Secondary | ICD-10-CM | POA: Diagnosis not present

## 2020-03-19 DIAGNOSIS — Z7982 Long term (current) use of aspirin: Secondary | ICD-10-CM | POA: Diagnosis not present

## 2020-03-19 DIAGNOSIS — Z7984 Long term (current) use of oral hypoglycemic drugs: Secondary | ICD-10-CM | POA: Diagnosis not present

## 2020-03-19 DIAGNOSIS — I50810 Right heart failure, unspecified: Secondary | ICD-10-CM | POA: Diagnosis not present

## 2020-03-19 DIAGNOSIS — L97812 Non-pressure chronic ulcer of other part of right lower leg with fat layer exposed: Secondary | ICD-10-CM | POA: Diagnosis not present

## 2020-03-19 DIAGNOSIS — Z9181 History of falling: Secondary | ICD-10-CM | POA: Diagnosis not present

## 2020-03-19 DIAGNOSIS — Z79899 Other long term (current) drug therapy: Secondary | ICD-10-CM | POA: Diagnosis not present

## 2020-03-26 DIAGNOSIS — Z6829 Body mass index (BMI) 29.0-29.9, adult: Secondary | ICD-10-CM | POA: Diagnosis not present

## 2020-03-26 DIAGNOSIS — L97919 Non-pressure chronic ulcer of unspecified part of right lower leg with unspecified severity: Secondary | ICD-10-CM | POA: Diagnosis not present

## 2020-03-26 DIAGNOSIS — I83019 Varicose veins of right lower extremity with ulcer of unspecified site: Secondary | ICD-10-CM | POA: Diagnosis not present

## 2020-03-27 DIAGNOSIS — E1122 Type 2 diabetes mellitus with diabetic chronic kidney disease: Secondary | ICD-10-CM | POA: Diagnosis not present

## 2020-03-27 DIAGNOSIS — I50810 Right heart failure, unspecified: Secondary | ICD-10-CM | POA: Diagnosis not present

## 2020-03-27 DIAGNOSIS — Z944 Liver transplant status: Secondary | ICD-10-CM | POA: Diagnosis not present

## 2020-03-27 DIAGNOSIS — I130 Hypertensive heart and chronic kidney disease with heart failure and stage 1 through stage 4 chronic kidney disease, or unspecified chronic kidney disease: Secondary | ICD-10-CM | POA: Diagnosis not present

## 2020-03-27 DIAGNOSIS — L97812 Non-pressure chronic ulcer of other part of right lower leg with fat layer exposed: Secondary | ICD-10-CM | POA: Diagnosis not present

## 2020-03-27 DIAGNOSIS — Z7984 Long term (current) use of oral hypoglycemic drugs: Secondary | ICD-10-CM | POA: Diagnosis not present

## 2020-03-27 DIAGNOSIS — I272 Pulmonary hypertension, unspecified: Secondary | ICD-10-CM | POA: Diagnosis not present

## 2020-03-27 DIAGNOSIS — Z7982 Long term (current) use of aspirin: Secondary | ICD-10-CM | POA: Diagnosis not present

## 2020-03-27 DIAGNOSIS — Z9181 History of falling: Secondary | ICD-10-CM | POA: Diagnosis not present

## 2020-03-27 DIAGNOSIS — L97822 Non-pressure chronic ulcer of other part of left lower leg with fat layer exposed: Secondary | ICD-10-CM | POA: Diagnosis not present

## 2020-03-27 DIAGNOSIS — N183 Chronic kidney disease, stage 3 unspecified: Secondary | ICD-10-CM | POA: Diagnosis not present

## 2020-03-27 DIAGNOSIS — Z794 Long term (current) use of insulin: Secondary | ICD-10-CM | POA: Diagnosis not present

## 2020-03-27 DIAGNOSIS — Z79899 Other long term (current) drug therapy: Secondary | ICD-10-CM | POA: Diagnosis not present

## 2020-04-06 DIAGNOSIS — N289 Disorder of kidney and ureter, unspecified: Secondary | ICD-10-CM | POA: Diagnosis not present

## 2020-04-17 DIAGNOSIS — I10 Essential (primary) hypertension: Secondary | ICD-10-CM | POA: Diagnosis not present

## 2020-04-17 DIAGNOSIS — I50810 Right heart failure, unspecified: Secondary | ICD-10-CM | POA: Diagnosis not present

## 2020-04-17 DIAGNOSIS — I272 Pulmonary hypertension, unspecified: Secondary | ICD-10-CM | POA: Diagnosis not present

## 2020-04-17 DIAGNOSIS — Z8639 Personal history of other endocrine, nutritional and metabolic disease: Secondary | ICD-10-CM | POA: Diagnosis not present

## 2020-04-21 DIAGNOSIS — I509 Heart failure, unspecified: Secondary | ICD-10-CM | POA: Diagnosis not present

## 2020-04-21 DIAGNOSIS — N1832 Chronic kidney disease, stage 3b: Secondary | ICD-10-CM | POA: Diagnosis not present

## 2020-04-21 DIAGNOSIS — I129 Hypertensive chronic kidney disease with stage 1 through stage 4 chronic kidney disease, or unspecified chronic kidney disease: Secondary | ICD-10-CM | POA: Diagnosis not present

## 2020-04-21 DIAGNOSIS — E1122 Type 2 diabetes mellitus with diabetic chronic kidney disease: Secondary | ICD-10-CM | POA: Diagnosis not present

## 2020-05-04 DIAGNOSIS — Z13220 Encounter for screening for lipoid disorders: Secondary | ICD-10-CM | POA: Diagnosis not present

## 2020-05-04 DIAGNOSIS — Z125 Encounter for screening for malignant neoplasm of prostate: Secondary | ICD-10-CM | POA: Diagnosis not present

## 2020-05-04 DIAGNOSIS — Z0000 Encounter for general adult medical examination without abnormal findings: Secondary | ICD-10-CM | POA: Diagnosis not present

## 2020-05-04 DIAGNOSIS — Z79899 Other long term (current) drug therapy: Secondary | ICD-10-CM | POA: Diagnosis not present

## 2020-05-04 DIAGNOSIS — Z944 Liver transplant status: Secondary | ICD-10-CM | POA: Diagnosis not present

## 2020-05-04 DIAGNOSIS — Z6828 Body mass index (BMI) 28.0-28.9, adult: Secondary | ICD-10-CM | POA: Diagnosis not present

## 2020-05-04 DIAGNOSIS — Z1331 Encounter for screening for depression: Secondary | ICD-10-CM | POA: Diagnosis not present

## 2020-05-11 DIAGNOSIS — Z961 Presence of intraocular lens: Secondary | ICD-10-CM | POA: Diagnosis not present

## 2020-05-11 DIAGNOSIS — H2511 Age-related nuclear cataract, right eye: Secondary | ICD-10-CM | POA: Diagnosis not present

## 2020-05-11 DIAGNOSIS — H26492 Other secondary cataract, left eye: Secondary | ICD-10-CM | POA: Diagnosis not present

## 2020-05-11 DIAGNOSIS — H524 Presbyopia: Secondary | ICD-10-CM | POA: Diagnosis not present

## 2020-05-18 DIAGNOSIS — Z298 Encounter for other specified prophylactic measures: Secondary | ICD-10-CM | POA: Diagnosis not present

## 2020-05-18 DIAGNOSIS — Z944 Liver transplant status: Secondary | ICD-10-CM | POA: Diagnosis not present

## 2020-05-18 DIAGNOSIS — D849 Immunodeficiency, unspecified: Secondary | ICD-10-CM | POA: Diagnosis not present

## 2020-05-25 DIAGNOSIS — Z944 Liver transplant status: Secondary | ICD-10-CM | POA: Diagnosis not present

## 2020-06-01 DIAGNOSIS — Z944 Liver transplant status: Secondary | ICD-10-CM | POA: Diagnosis not present

## 2020-06-09 ENCOUNTER — Encounter: Payer: Self-pay | Admitting: Allergy

## 2020-06-09 ENCOUNTER — Other Ambulatory Visit: Payer: Self-pay

## 2020-06-09 ENCOUNTER — Ambulatory Visit: Payer: Medicare Other | Admitting: Allergy

## 2020-06-09 VITALS — BP 122/58 | HR 100 | Resp 16

## 2020-06-09 DIAGNOSIS — J301 Allergic rhinitis due to pollen: Secondary | ICD-10-CM | POA: Diagnosis not present

## 2020-06-09 DIAGNOSIS — H1013 Acute atopic conjunctivitis, bilateral: Secondary | ICD-10-CM | POA: Diagnosis not present

## 2020-06-09 MED ORDER — IPRATROPIUM BROMIDE 0.06 % NA SOLN: NASAL | 5 refills | Status: DC

## 2020-06-09 NOTE — Patient Instructions (Addendum)
-  Continue avoidance measures for pollens (grass, weed and tree pollen) -Stop Astelin as has not been effective -Start nasal Atrovent 2 sprays each nostril twice a day.  May use up to 4 times a day as needed for nasal drainage control.  If the nose seems to be getting too dry then decrease use and or stop.  Use with proper nasal spray technique by pointing the tip of the nostril toward the eye of the same side nostril -Continue fexofenadine (Allegra) 180 mg 1 tablet daily -During pollen seasons if having itchy or watery eyes can use over-the-counter Pataday 1 drop each eye daily as needed -Allergen immunotherapy (allergy shots) may be beneficial for symptoms that is driven by pollen.  There is a 3 to 5-year therapy that it decreases your sensitivity to pollens.  If you are no longer sensitive to pollens been with exposure you should have decrease in symptoms and decreased need for medication management.  This would not impact symptoms that occur outside of pollen season (during winter).   Follow-up in 4 months or sooner if needed

## 2020-06-09 NOTE — Progress Notes (Signed)
Follow-up Note  RE: Gregory Tanner MRN: [id number] DOB: 1955/07/20 Date of Office Visit: 06/09/2020   History of present illness: Gregory Tanner is a 65 y.o. male presenting today for follow-up of allergic rhinitis with conjunctivitis.  He was last seen in the office for initial visit on 02/04/2020 by myself. He states he has the same degree of nasal drainage that he had at his initial visit.  He states the drainage can be so much especially in the mornings and it is hard to swallow the mucus buildup in the back of the throat.  It is quite frustrating for him to have a lot of nasal drainage.  He is using Astelin 2 sprays twice a day but does not note any improvement in his nasal drainage symptoms.  He has taken Allegra daily and does feel the Allegra helps with his pollen driven allergens but did not help with the nasal drainage that continued to recur through the winter. He states he used the triamcinolone after the last visit that did help the rash and it went away but states the rash came back and he used the triamcinolone again and had an area of skin that seem to slough off and bleed thus he stopped using the triamcinolone.  The rash has not recurred again.  Review of systems in the past 4 weeks: Review of Systems  Constitutional: Negative.   HENT:       See HPI  Eyes: Negative.   Respiratory: Negative.   Cardiovascular: Negative.   Gastrointestinal: Negative.   Musculoskeletal: Negative.   Skin: Negative.   Neurological: Negative.     All other systems negative unless noted above in HPI  Past medical/social/surgical/family history have been reviewed and are unchanged unless specifically indicated below.  No changes  Medication List: Current Outpatient Medications  Medication Sig Dispense Refill  . allopurinol (ZYLOPRIM) 300 MG tablet Take 150 mg by mouth daily.   3  . aspirin 81 MG tablet Take 81 mg by mouth daily.    . bumetanide (BUMEX) 2 MG tablet Take 1.5  tablets (3 mg total ) by mouth daily in the morning, and one tablet (2 mg total) by mouth daily in the evening. 120 tablet 3  . fexofenadine (ALLEGRA) 180 MG tablet Take 180 mg by mouth daily.    Marland Kitchen gemfibrozil (LOPID) 600 MG tablet TAKE 1/2 TABLET BY MOUTH TWICE (2) DAILY BEFORE A MEAL 60 tablet 2  . hydrALAZINE (APRESOLINE) 25 MG tablet TAKE 1/2 TABLET BY MOUTH 3 TIMES DAILY IF YOUR SYSTOLIC BLOOD PRESSURE (TOP NUMBER) IS GREATER THAN 140 270 tablet 2  . insulin regular human CONCENTRATED (HUMULIN R U-500 KWIKPEN) 500 UNIT/ML kwikpen Inject 50-110 Units into the skin 3 (three) times daily with meals. Inject 105 units with breakfast, 50-60 units with midday meal depending on the meal, and 100 units with dinner    . ipratropium (ATROVENT) 0.06 % nasal spray Use two sprays in each nostril twice daily 15 mL 5  . JARDIANCE 10 MG TABS tablet Take 10 mg by mouth daily.    . metolazone (ZAROXOLYN) 5 MG tablet Take 5 mg by mouth 2 (two) times a week.    . Multiple Vitamin (MULTIVITAMIN) tablet Take 1 tablet by mouth daily.    . mycophenolate (CELLCEPT) 250 MG capsule Take 250 mg by mouth 3 (three) times daily.    . potassium chloride SA (KLOR-CON) 20 MEQ tablet Take 20 mEq by mouth 2 (two) times daily.    Marland Kitchen  spironolactone (ALDACTONE) 25 MG tablet Take 12.5 mg by mouth daily.    Marland Kitchen triamcinolone ointment (KENALOG) 0.1 % Can apply thin layer to rash on chest twice daily until improved.  Do not use more than 2 weeks at a time. (Patient not taking: Reported on 06/09/2020) 30 g 5   No current facility-administered medications for this visit.     Known medication allergies: Allergies  Allergen Reactions  . Iodinated Diagnostic Agents Hives and Other (See Comments)    Allergy is not to all contrast - but patient is unsure of which particular one his allergy is to. Allergy is not to all contrast - but patient is unsure of which particular one his allergy is to. Contrast dye used before liver transplant cause  hives, not sure which dye this was but is able to use others  . Latex Other (See Comments)    Skin peeling  . Metformin And Related Nausea And Vomiting  . Rosiglitazone Nausea And Vomiting    GI effects and abdominal pain     Physical examination: Blood pressure (!) 122/58, pulse 100, resp. rate 16.  General: Alert, interactive, in no acute distress. HEENT: PERRLA, TMs pearly gray, turbinates non-edematous without discharge, post-pharynx non erythematous. Neck: Supple without lymphadenopathy. Lungs: Clear to auscultation without wheezing, rhonchi or rales. {no increased work of breathing. CV: Normal S1, S2 without murmurs. Abdomen: Nondistended, nontender. Skin: Warm and dry, without lesions or rashes. Extremities:  No clubbing, cyanosis or edema. Neuro:   Grossly intact.  Diagnositics/Labs: None today  Assessment and plan: Allergic rhinitis with conjunctivitis with significant postnasal drainage component  -Continue avoidance measures for pollens (grass, weed and tree pollen) -Stop Astelin as has not been effective -Start nasal Atrovent 2 sprays each nostril twice a day.  May use up to 4 times a day as needed for nasal drainage control.  If the nose seems to be getting too dry then decrease use and or stop.  Use with proper nasal spray technique by pointing the tip of the nostril toward the eye of the same side nostril -Continue fexofenadine (Allegra) 180 mg 1 tablet daily -During pollen seasons if having itchy or watery eyes can use over-the-counter Pataday 1 drop each eye daily as needed -Allergen immunotherapy (allergy shots) may be beneficial for symptoms that is driven by pollen.  There is a 3 to 5-year therapy that it decreases your sensitivity to pollens.  If you are no longer sensitive to pollens been with exposure you should have decrease in symptoms and decreased need for medication management.  This would not impact symptoms that occur outside of pollen season (during  winter).   Follow-up in 4 months or sooner if needed  I appreciate the opportunity to take part in Miranda's care. Please do not hesitate to contact me with questions.  Sincerely,   Prudy Feeler, MD Allergy/Immunology [facility]

## 2020-06-16 DIAGNOSIS — I129 Hypertensive chronic kidney disease with stage 1 through stage 4 chronic kidney disease, or unspecified chronic kidney disease: Secondary | ICD-10-CM | POA: Diagnosis not present

## 2020-06-16 DIAGNOSIS — I509 Heart failure, unspecified: Secondary | ICD-10-CM | POA: Diagnosis not present

## 2020-06-16 DIAGNOSIS — E1122 Type 2 diabetes mellitus with diabetic chronic kidney disease: Secondary | ICD-10-CM | POA: Diagnosis not present

## 2020-06-16 DIAGNOSIS — N1832 Chronic kidney disease, stage 3b: Secondary | ICD-10-CM | POA: Diagnosis not present

## 2020-06-22 DIAGNOSIS — Z79899 Other long term (current) drug therapy: Secondary | ICD-10-CM | POA: Diagnosis not present

## 2020-06-23 DIAGNOSIS — H2511 Age-related nuclear cataract, right eye: Secondary | ICD-10-CM | POA: Diagnosis not present

## 2020-06-23 DIAGNOSIS — Z961 Presence of intraocular lens: Secondary | ICD-10-CM | POA: Diagnosis not present

## 2020-06-23 DIAGNOSIS — H18413 Arcus senilis, bilateral: Secondary | ICD-10-CM | POA: Diagnosis not present

## 2020-06-23 DIAGNOSIS — H26492 Other secondary cataract, left eye: Secondary | ICD-10-CM | POA: Diagnosis not present

## 2020-07-01 ENCOUNTER — Telehealth: Payer: Self-pay | Admitting: Allergy

## 2020-07-01 ENCOUNTER — Other Ambulatory Visit: Payer: Self-pay | Admitting: *Deleted

## 2020-07-01 MED ORDER — IPRATROPIUM BROMIDE 0.06 % NA SOLN
NASAL | 5 refills | Status: DC
Start: 2020-07-01 — End: 2020-12-03

## 2020-07-01 NOTE — Telephone Encounter (Signed)
Spoke with [name], I sent a new RX dispensing 2 bottles. I did tell him that insurance may not cover this.

## 2020-07-01 NOTE — Telephone Encounter (Signed)
Patient states he was told that if he needed to use his ipratropium more than twice a day he could, which he has been having to use it more. Patient states insurance will not cover a refill because it is too soon. Patient states he will be completely out the next time he uses it.  [facility].  Please advise.

## 2020-07-06 ENCOUNTER — Other Ambulatory Visit: Payer: Self-pay | Admitting: Cardiology

## 2020-07-06 NOTE — Telephone Encounter (Signed)
Lopid approve and sent

## 2020-07-20 DIAGNOSIS — I50810 Right heart failure, unspecified: Secondary | ICD-10-CM | POA: Diagnosis not present

## 2020-07-20 DIAGNOSIS — I5032 Chronic diastolic (congestive) heart failure: Secondary | ICD-10-CM | POA: Diagnosis not present

## 2020-07-20 DIAGNOSIS — I10 Essential (primary) hypertension: Secondary | ICD-10-CM | POA: Diagnosis not present

## 2020-07-20 DIAGNOSIS — Z944 Liver transplant status: Secondary | ICD-10-CM | POA: Diagnosis not present

## 2020-07-21 DIAGNOSIS — H524 Presbyopia: Secondary | ICD-10-CM | POA: Diagnosis not present

## 2020-07-21 DIAGNOSIS — H59032 Cystoid macular edema following cataract surgery, left eye: Secondary | ICD-10-CM | POA: Diagnosis not present

## 2020-07-21 DIAGNOSIS — H5202 Hypermetropia, left eye: Secondary | ICD-10-CM | POA: Diagnosis not present

## 2020-07-21 DIAGNOSIS — H5211 Myopia, right eye: Secondary | ICD-10-CM | POA: Diagnosis not present

## 2020-07-21 DIAGNOSIS — H35372 Puckering of macula, left eye: Secondary | ICD-10-CM | POA: Diagnosis not present

## 2020-07-22 DIAGNOSIS — Z944 Liver transplant status: Secondary | ICD-10-CM | POA: Diagnosis not present

## 2020-08-31 DIAGNOSIS — I50810 Right heart failure, unspecified: Secondary | ICD-10-CM | POA: Diagnosis not present

## 2020-08-31 DIAGNOSIS — E119 Type 2 diabetes mellitus without complications: Secondary | ICD-10-CM | POA: Diagnosis not present

## 2020-09-01 DIAGNOSIS — Z6827 Body mass index (BMI) 27.0-27.9, adult: Secondary | ICD-10-CM | POA: Diagnosis not present

## 2020-09-01 DIAGNOSIS — R300 Dysuria: Secondary | ICD-10-CM | POA: Diagnosis not present

## 2020-09-18 DIAGNOSIS — Z794 Long term (current) use of insulin: Secondary | ICD-10-CM | POA: Diagnosis not present

## 2020-09-18 DIAGNOSIS — E119 Type 2 diabetes mellitus without complications: Secondary | ICD-10-CM | POA: Diagnosis not present

## 2020-09-18 DIAGNOSIS — E1165 Type 2 diabetes mellitus with hyperglycemia: Secondary | ICD-10-CM | POA: Diagnosis not present

## 2020-09-21 DIAGNOSIS — I509 Heart failure, unspecified: Secondary | ICD-10-CM | POA: Diagnosis not present

## 2020-09-21 DIAGNOSIS — N1832 Chronic kidney disease, stage 3b: Secondary | ICD-10-CM | POA: Diagnosis not present

## 2020-09-21 DIAGNOSIS — E1122 Type 2 diabetes mellitus with diabetic chronic kidney disease: Secondary | ICD-10-CM | POA: Diagnosis not present

## 2020-09-21 DIAGNOSIS — I129 Hypertensive chronic kidney disease with stage 1 through stage 4 chronic kidney disease, or unspecified chronic kidney disease: Secondary | ICD-10-CM | POA: Diagnosis not present

## 2020-10-06 ENCOUNTER — Ambulatory Visit: Payer: Medicare Other | Admitting: Allergy

## 2020-10-07 ENCOUNTER — Ambulatory Visit (INDEPENDENT_AMBULATORY_CARE_PROVIDER_SITE_OTHER): Payer: Medicare Other | Admitting: Otolaryngology

## 2020-10-07 ENCOUNTER — Other Ambulatory Visit: Payer: Self-pay

## 2020-10-07 DIAGNOSIS — J310 Chronic rhinitis: Secondary | ICD-10-CM | POA: Diagnosis not present

## 2020-10-07 MED ORDER — TRIAMCINOLONE ACETONIDE 55 MCG/ACT NA AERO: 2.0000 | INHALATION_SPRAY | Freq: Every day | NASAL | 12 refills | Status: DC

## 2020-10-07 NOTE — Progress Notes (Signed)
HPI: Gregory Tanner is a 65 y.o. male who presents is referred by his PCP for evaluation of complaints of chronic postnasal drainage.  He describes thick mucus in his throat with a lot of postnasal drainage.  He has tried Flonase in the past without much benefit.  He has seen an allergist and already had mild allergy on testing.  Most recently was treated with Atrovent 0.06% that he uses 3-4 times a day.  But he complains of chronic postnasal drainage that he has had for over 2 years.  He has tried saline rinses but has difficulty using these.Marland Kitchen  Past Medical History:  Diagnosis Date   Ankylosis of lumbar spine 02/21/2014   Benign essential hypertension 10/26/2015   BP (high blood pressure) 12/27/2010   Chronic diastolic heart failure ([place]) 09/26/2016   Chronic pain associated with significant psychosocial dysfunction 06/17/2015   Chronic pain disorder 10/28/2015   CKD (chronic kidney disease) stage 3, GFR 30-59 ml/min (HCC) 03/27/2017   Degeneration of intervertebral disc of lumbar region 07/05/2013   Degenerative disc disease, lumbar 07/26/2012   Diabetes mellitus without complication ([place])    Encounter for other specified prophylactic measures 10/10/2011   Excess weight 10/10/2011   Hearing loss    Heart failure, diastolic, acute on chronic ([place]) 07/24/2019   Hepatocellular carcinoma ([place]) 12/27/2010   Overview:  Embolized 6/12    History of liver transplant ([place]) 07/20/2011   Overview:  06/07/2011 for cryptogenic cirrhosis and HCC    Hypertension    Hypertensive heart disease with heart failure ([place]) 10/26/2015   Hypokalemia    Liver disease    Lumbar radiculopathy 10/10/2012   Nerve root pain 06/17/2015   Overview:  Right    SOB (shortness of breath) on exertion 10/28/2015   Swelling    Type 2 diabetes mellitus ([place]) 12/27/2010   Overview:  H1C 6.5 12/2010    Uncontrolled type 2 diabetes mellitus with insulin therapy ([place]) 06/21/2018   Past Surgical History:  Procedure Laterality Date   BACK  SURGERY     BAKER CYST SURGERY     RIGHT LEG   EYE SURGERY Left    HERNIA REPAIR     LIVER SURGERY     IMPLANT   LIVER TRANSPLANT     TONSILL SURGERY     TONSILLECTOMY     Social History   Socioeconomic History   Marital status: Married    Spouse name: Not on file   Number of children: Not on file   Years of education: Not on file   Highest education level: Not on file  Occupational History   Not on file  Tobacco Use   Smoking status: Never   Smokeless tobacco: Never  Vaping Use   Vaping Use: Never used  Substance and Sexual Activity   Alcohol use: Yes    Alcohol/week: 0.0 standard drinks    Comment: ocassional   Drug use: Not Currently   Sexual activity: Not on file  Other Topics Concern   Not on file  Social History Narrative   Not on file   Social Determinants of Health   Financial Resource Strain: Not on file  Food Insecurity: Not on file  Transportation Needs: Not on file  Physical Activity: Not on file  Stress: Not on file  Social Connections: Not on file   Family History  Problem Relation Age of Onset   Cancer Mother    Diabetes Father    Heart attack Father    Schizophrenia  Father    Hypertension Brother    Allergies  Allergen Reactions   Iodinated Diagnostic Agents Hives and Other (See Comments)    Allergy is not to all contrast - but patient is unsure of which particular one his allergy is to. Allergy is not to all contrast - but patient is unsure of which particular one his allergy is to. Contrast dye used before liver transplant cause hives, not sure which dye this was but is able to use others   Latex Other (See Comments)    Skin peeling   Metformin And Related Nausea And Vomiting   Rosiglitazone Nausea And Vomiting    GI effects and abdominal pain   Prior to Admission medications   Medication Sig Start Date End Date Taking? Authorizing Provider  allopurinol (ZYLOPRIM) 300 MG tablet Take 150 mg by mouth daily.  08/12/16   [provider]  aspirin 81 MG tablet Take 81 mg by mouth daily.    [provider]  bumetanide (BUMEX) 2 MG tablet Take 1.5 tablets (3 mg total ) by mouth daily in the morning, and one tablet (2 mg total) by mouth daily in the evening. 08/29/19   Richardo Priest, MD  fexofenadine (ALLEGRA) 180 MG tablet Take 180 mg by mouth daily.    [provider]  gemfibrozil (LOPID) 600 MG tablet TAKE 1/2 TABLET BY MOUTH TWICE A DAY BEFORE MEALS 07/06/20   Richardo Priest, MD  hydrALAZINE (APRESOLINE) 25 MG tablet TAKE 1/2 TABLET BY MOUTH 3 TIMES DAILY IF YOUR SYSTOLIC BLOOD PRESSURE (TOP NUMBER) IS GREATER THAN 140 02/21/20   Revankar, Reita Cliche, MD  insulin regular human CONCENTRATED (HUMULIN R U-500 KWIKPEN) 500 UNIT/ML kwikpen Inject 50-110 Units into the skin 3 (three) times daily with meals. Inject 105 units with breakfast, 50-60 units with midday meal depending on the meal, and 100 units with dinner    [provider]  ipratropium (ATROVENT) 0.06 % nasal spray Use 2 sprays in each nostril 2-4 times daily 07/01/20   Kennith Gain, MD  JARDIANCE 10 MG TABS tablet Take 10 mg by mouth daily. 01/20/20   [provider]  metolazone (ZAROXOLYN) 5 MG tablet Take 5 mg by mouth 2 (two) times a week.    [provider]  Multiple Vitamin (MULTIVITAMIN) tablet Take 1 tablet by mouth daily.    [provider]  mycophenolate (CELLCEPT) 250 MG capsule Take 250 mg by mouth 3 (three) times daily.    [provider]  potassium chloride SA (KLOR-CON) 20 MEQ tablet Take 20 mEq by mouth 2 (two) times daily. 01/20/20   [provider]  spironolactone (ALDACTONE) 25 MG tablet Take 12.5 mg by mouth daily. 01/28/20 01/27/21  [provider]  triamcinolone ointment (KENALOG) 0.1 % Can apply thin layer to rash on chest twice daily until improved.  Do not use more than 2 weeks at a time. Patient not taking: Reported on 06/09/2020 02/04/20   Kennith Gain, MD     Positive ROS: Otherwise negative  All other systems have been reviewed and were otherwise negative with the exception of those mentioned in the HPI and as above.  Physical Exam: Constitutional: Alert, well-appearing, no acute distress Ears: External ears without lesions or tenderness. Ear canals are clear bilaterally with intact, clear TMs.  Nasal: External nose without lesions. Septum with mild deformity.  He does not complain of any trouble breathing through his nose.  On nasal endoscopy the middle  meatus regions were clear bilaterally with no mucopurulent discharge or discharge from the middle meatus or from the sphenoid area.  The nasopharynx was clear.  The mucus within the nasal cavity is clear.  No signs of infection or chronic drainage noted on clinical exam.  Oral: Lips and gums without lesions. Tongue and palate mucosa without lesions. Posterior oropharynx clear.  He is status post tonsillectomy.  Indirect laryngoscopy revealed a clear base of tongue vallecula and epiglottis. Neck: No palpable adenopathy or masses Respiratory: Breathing comfortably  Skin: No facial/neck lesions or rash noted.  Procedures  Assessment: Chronic rhinitis with postnasal drainage.  Plan: Reviewed with [name] as well as his wife concerning limited treatment options for this. I think the best treatment would be regular use of nasal steroid spray Nasacort 2 sprays each nostril at night and will send a prescription for Nasacort to use.  Also reviewed with him concerning use of saline irrigations or saline rinses as this will help clear the mucus from his nose as well as help dry the nose some. The Atrovent is okay to use but would not use it as frequently he has is presently using it. Unfortunately because of his problems with water retention he cannot drink an excessive amount of water in the mucus that he produces probably will tend to be thicker.  Suggest that he might try use of  Mucinex to see if this helps at all.  But I think the best treatment for him would be regular use of saline irrigation or spray and suggested trying the [place] brand.   Radene Journey, MD   CC:

## 2020-10-23 DIAGNOSIS — Z944 Liver transplant status: Secondary | ICD-10-CM | POA: Diagnosis not present

## 2020-10-26 DIAGNOSIS — L728 Other follicular cysts of the skin and subcutaneous tissue: Secondary | ICD-10-CM | POA: Diagnosis not present

## 2020-11-09 DIAGNOSIS — S99929A Unspecified injury of unspecified foot, initial encounter: Secondary | ICD-10-CM | POA: Diagnosis not present

## 2020-11-09 DIAGNOSIS — Z6828 Body mass index (BMI) 28.0-28.9, adult: Secondary | ICD-10-CM | POA: Diagnosis not present

## 2020-11-09 DIAGNOSIS — Z23 Encounter for immunization: Secondary | ICD-10-CM | POA: Diagnosis not present

## 2020-11-13 ENCOUNTER — Encounter: Payer: Self-pay | Admitting: Sports Medicine

## 2020-11-13 ENCOUNTER — Other Ambulatory Visit: Payer: Self-pay

## 2020-11-13 ENCOUNTER — Ambulatory Visit: Payer: Medicare Other | Admitting: Sports Medicine

## 2020-11-13 DIAGNOSIS — L601 Onycholysis: Secondary | ICD-10-CM

## 2020-11-13 DIAGNOSIS — M79674 Pain in right toe(s): Secondary | ICD-10-CM

## 2020-11-13 DIAGNOSIS — E119 Type 2 diabetes mellitus without complications: Secondary | ICD-10-CM | POA: Diagnosis not present

## 2020-11-13 DIAGNOSIS — L603 Nail dystrophy: Secondary | ICD-10-CM

## 2020-11-13 NOTE — Progress Notes (Signed)
Subjective: Gregory Tanner is a 65 y.o. diabetic male patient presents to office today complaining of a painful lifting right great toenail.  Patient had an injury to the nail 2 days ago hit the toe in the dark and lifted it up.  Reports that it has been bleeding and he has been using antibiotic cream and Band-Aid to the area.  Went to PCP who started him on Keflex has about 3 days left.  Fasting Blood sugar 146  Patient Active Problem List   Diagnosis Date Noted   Swelling    Liver disease    Hypertension    Hearing loss    Diabetes mellitus without complication ([place])    Hypokalemia    Heart failure, diastolic, acute on chronic (HCC) 07/24/2019   Uncontrolled type 2 diabetes mellitus with insulin therapy ([place]) 06/21/2018   CKD (chronic kidney disease) stage 3, GFR 30-59 ml/min (HCC) 03/27/2017   Chronic diastolic heart failure ([place]) 09/26/2016   Chronic pain disorder 10/28/2015   SOB (shortness of breath) on exertion 10/28/2015   Hypertensive heart disease with heart failure ([place]) 10/26/2015   Benign essential hypertension 10/26/2015   Chronic pain associated with significant psychosocial dysfunction 06/17/2015   Nerve root pain 06/17/2015   Ankylosis of lumbar spine 02/21/2014   Degeneration of intervertebral disc of lumbar region 07/05/2013   Lumbar radiculopathy 10/10/2012   Degenerative disc disease, lumbar 07/26/2012   Encounter for other specified prophylactic measures 10/10/2011   Excess weight 10/10/2011   History of liver transplant ([place]) 07/20/2011   Type 2 diabetes mellitus ([place]) 12/27/2010   Hepatocellular carcinoma ([place]) 12/27/2010   BP (high blood pressure) 12/27/2010    Current Outpatient Medications on File Prior to Visit  Medication Sig Dispense Refill   acetone, urine, test (RELION KETONE) strip Use 1 strip as needed for High Blood Sugar     azelastine (ASTELIN) 0.1 % nasal spray Place into the nose.     Continuous Blood Gluc Receiver (DEXCOM G6 RECEIVER)  DEVI Use 1 each as directed     Continuous Blood Gluc Sensor (DEXCOM G6 SENSOR) MISC Use 1 each every 10 (ten) days     Continuous Blood Gluc Transmit (DEXCOM G6 TRANSMITTER) MISC Use 1 each every 3 (three) months     insulin lispro (HUMALOG) 100 UNIT/ML KwikPen Inject 3 times a day with meals as per carb ratio and correction TDD 90 units     allopurinol (ZYLOPRIM) 300 MG tablet Take 150 mg by mouth daily.   3   aspirin 81 MG tablet Take 81 mg by mouth daily.     bumetanide (BUMEX) 2 MG tablet Take 1.5 tablets (3 mg total ) by mouth daily in the morning, and one tablet (2 mg total) by mouth daily in the evening. 120 tablet 3   cephALEXin (KEFLEX) 500 MG capsule Take 500 mg by mouth 3 (three) times daily.     diclofenac (VOLTAREN) 0.1 % ophthalmic solution Place 1 drop into the left eye 4 (four) times daily.     fexofenadine (ALLEGRA) 180 MG tablet Take 180 mg by mouth daily.     gemfibrozil (LOPID) 600 MG tablet TAKE 1/2 TABLET BY MOUTH TWICE A DAY BEFORE MEALS 60 tablet 2   hydrALAZINE (APRESOLINE) 25 MG tablet TAKE 1/2 TABLET BY MOUTH 3 TIMES DAILY IF YOUR SYSTOLIC BLOOD PRESSURE (TOP NUMBER) IS GREATER THAN 140 270 tablet 2   insulin regular human CONCENTRATED (HUMULIN R U-500 KWIKPEN) 500 UNIT/ML kwikpen Inject 50-110 Units into  the skin 3 (three) times daily with meals. Inject 105 units with breakfast, 50-60 units with midday meal depending on the meal, and 100 units with dinner     ipratropium (ATROVENT) 0.06 % nasal spray Use 2 sprays in each nostril 2-4 times daily 30 mL 5   JARDIANCE 10 MG TABS tablet Take 10 mg by mouth daily.     metolazone (ZAROXOLYN) 5 MG tablet Take 5 mg by mouth 2 (two) times a week.     Multiple Vitamin (MULTIVITAMIN) tablet Take 1 tablet by mouth daily.     mycophenolate (CELLCEPT) 250 MG capsule Take 250 mg by mouth 3 (three) times daily.     potassium chloride SA (KLOR-CON) 20 MEQ tablet Take 20 mEq by mouth 2 (two) times daily.     PREDNISOLONE ACETATE P-F 1 %  ophthalmic suspension 1 drop 4 (four) times daily.     spironolactone (ALDACTONE) 25 MG tablet Take 12.5 mg by mouth daily.     triamcinolone (NASACORT) 55 MCG/ACT AERO nasal inhaler Place 2 sprays into the nose daily. 2 sprays each nostril at night 1 each 12   triamcinolone ointment (KENALOG) 0.1 % Can apply thin layer to rash on chest twice daily until improved.  Do not use more than 2 weeks at a time. (Patient not taking: Reported on 06/09/2020) 30 g 5   No current facility-administered medications on file prior to visit.    Allergies  Allergen Reactions   Iodinated Diagnostic Agents Hives and Other (See Comments)    Allergy is not to all contrast - but patient is unsure of which particular one his allergy is to. Allergy is not to all contrast - but patient is unsure of which particular one his allergy is to. Contrast dye used before liver transplant cause hives, not sure which dye this was but is able to use others   Latex Other (See Comments)    Skin peeling   Metformin And Related Nausea And Vomiting   Rosiglitazone Nausea And Vomiting    GI effects and abdominal pain    Objective:  There were no vitals filed for this visit.  General: Well developed, nourished, in no acute distress, alert and oriented x3   Dermatology: Skin is warm, dry and supple bilateral.  Right hallux nail appears to be partially attached with hyperkeratosis . (+) Erythema. (+) Edema. (+) bloody drainage present. The remaining nails appear unremarkable at this time history of previous nail removals sites at left hallux and 2nd toes are well healed. There are no open sores, lesions or other signs of infection present.  Vascular: Dorsalis Pedis artery and Posterior Tibial artery pedal pulses are 1/4 bilateral with immedate capillary fill time. Scant Pedal hair growth present. + lymphedema bilateral.   Neruologic: Grossly intact via light touch bilateral.  Musculoskeletal: Tenderness to palpation of the right  hallux nail.  Muscular strength within normal limits in all groups bilateral.   Assesement and Plan: Problem List Items Addressed This Visit       Endocrine   Diabetes mellitus without complication (HCC)   Relevant Medications   insulin lispro (HUMALOG) 100 UNIT/ML KwikPen   Other Visit Diagnoses     Nail dystrophy    -  Primary   Onycholysis       Toe pain, right           -Discussed treatment alternatives and plan of care; Explained permanent/temporary nail avulsion and post procedure course to patient. Patient elects for permanent removal  of the right hallux nail - After a verbal and written consent, injected 3 ml of a 50:50 mixture of 2% plain lidocaine and 0.5% plain marcaine in a normal hallux block fashion. Next, a betadine prep was performed. Anesthesia was tested and found to be appropriate.  The offending right hallux nail was then incised from the hyponychium to the epinychium. The right hallux nail completely was removed and cleared from the field. The area was curretted for any remaining nail or spicules. Phenol application performed and the area was then flushed with alcohol and dressed with antibiotic cream and a dry sterile dressing. -Patient was instructed to leave the dressing intact for today and begin soaking in a weak solution of betadine or Epsom salt and water tomorrow. Patient was instructed to soak for 15-20 minutes each day and apply neosporin and a gauze or bandaid dressing each day. -Patient was instructed to monitor the toe for signs of infection and return to office if toe becomes red, hot or swollen. -Advised ice, elevation, and tylenol or motrin if needed for pain.  -Patient is to return in 2-3 weeks for follow up care/nail check or sooner if problems arise.  Landis Martins, DPM

## 2020-11-20 DIAGNOSIS — J9811 Atelectasis: Secondary | ICD-10-CM | POA: Diagnosis not present

## 2020-11-20 DIAGNOSIS — J90 Pleural effusion, not elsewhere classified: Secondary | ICD-10-CM | POA: Diagnosis not present

## 2020-11-20 DIAGNOSIS — I517 Cardiomegaly: Secondary | ICD-10-CM | POA: Diagnosis not present

## 2020-11-20 DIAGNOSIS — N189 Chronic kidney disease, unspecified: Secondary | ICD-10-CM | POA: Diagnosis not present

## 2020-11-20 DIAGNOSIS — R059 Cough, unspecified: Secondary | ICD-10-CM | POA: Diagnosis not present

## 2020-11-20 DIAGNOSIS — R601 Generalized edema: Secondary | ICD-10-CM | POA: Diagnosis not present

## 2020-11-20 DIAGNOSIS — Z944 Liver transplant status: Secondary | ICD-10-CM | POA: Diagnosis not present

## 2020-11-20 DIAGNOSIS — E119 Type 2 diabetes mellitus without complications: Secondary | ICD-10-CM | POA: Diagnosis not present

## 2020-11-20 DIAGNOSIS — I110 Hypertensive heart disease with heart failure: Secondary | ICD-10-CM | POA: Diagnosis not present

## 2020-11-20 DIAGNOSIS — I509 Heart failure, unspecified: Secondary | ICD-10-CM | POA: Diagnosis not present

## 2020-11-22 ENCOUNTER — Other Ambulatory Visit: Payer: Self-pay

## 2020-11-22 ENCOUNTER — Emergency Department (HOSPITAL_COMMUNITY): Payer: Medicare Other

## 2020-11-22 ENCOUNTER — Inpatient Hospital Stay (HOSPITAL_COMMUNITY)
Admission: EM | Admit: 2020-11-22 | Discharge: 2020-12-03 | DRG: 291 | Disposition: A | Discharge status: Home / Self Care | Payer: Medicare Other | Attending: Internal Medicine | Admitting: Internal Medicine

## 2020-11-22 DIAGNOSIS — Z20822 Contact with and (suspected) exposure to covid-19: Secondary | ICD-10-CM | POA: Diagnosis present

## 2020-11-22 DIAGNOSIS — I471 Supraventricular tachycardia: Secondary | ICD-10-CM | POA: Diagnosis not present

## 2020-11-22 DIAGNOSIS — I5031 Acute diastolic (congestive) heart failure: Secondary | ICD-10-CM | POA: Diagnosis not present

## 2020-11-22 DIAGNOSIS — D696 Thrombocytopenia, unspecified: Secondary | ICD-10-CM | POA: Diagnosis present

## 2020-11-22 DIAGNOSIS — J90 Pleural effusion, not elsewhere classified: Secondary | ICD-10-CM | POA: Diagnosis not present

## 2020-11-22 DIAGNOSIS — Z794 Long term (current) use of insulin: Secondary | ICD-10-CM | POA: Diagnosis not present

## 2020-11-22 DIAGNOSIS — S90411A Abrasion, right great toe, initial encounter: Secondary | ICD-10-CM | POA: Diagnosis present

## 2020-11-22 DIAGNOSIS — W228XXA Striking against or struck by other objects, initial encounter: Secondary | ICD-10-CM | POA: Diagnosis present

## 2020-11-22 DIAGNOSIS — J209 Acute bronchitis, unspecified: Secondary | ICD-10-CM | POA: Diagnosis not present

## 2020-11-22 DIAGNOSIS — M109 Gout, unspecified: Secondary | ICD-10-CM | POA: Diagnosis present

## 2020-11-22 DIAGNOSIS — N400 Enlarged prostate without lower urinary tract symptoms: Secondary | ICD-10-CM | POA: Diagnosis present

## 2020-11-22 DIAGNOSIS — R188 Other ascites: Secondary | ICD-10-CM | POA: Diagnosis present

## 2020-11-22 DIAGNOSIS — E871 Hypo-osmolality and hyponatremia: Secondary | ICD-10-CM | POA: Diagnosis present

## 2020-11-22 DIAGNOSIS — Z79899 Other long term (current) drug therapy: Secondary | ICD-10-CM | POA: Diagnosis not present

## 2020-11-22 DIAGNOSIS — N1832 Chronic kidney disease, stage 3b: Secondary | ICD-10-CM | POA: Diagnosis not present

## 2020-11-22 DIAGNOSIS — I5033 Acute on chronic diastolic (congestive) heart failure: Secondary | ICD-10-CM | POA: Diagnosis not present

## 2020-11-22 DIAGNOSIS — Z8249 Family history of ischemic heart disease and other diseases of the circulatory system: Secondary | ICD-10-CM

## 2020-11-22 DIAGNOSIS — D509 Iron deficiency anemia, unspecified: Secondary | ICD-10-CM | POA: Diagnosis present

## 2020-11-22 DIAGNOSIS — I130 Hypertensive heart and chronic kidney disease with heart failure and stage 1 through stage 4 chronic kidney disease, or unspecified chronic kidney disease: Principal | ICD-10-CM | POA: Diagnosis present

## 2020-11-22 DIAGNOSIS — I509 Heart failure, unspecified: Secondary | ICD-10-CM

## 2020-11-22 DIAGNOSIS — Z944 Liver transplant status: Secondary | ICD-10-CM

## 2020-11-22 DIAGNOSIS — E785 Hyperlipidemia, unspecified: Secondary | ICD-10-CM | POA: Diagnosis present

## 2020-11-22 DIAGNOSIS — I517 Cardiomegaly: Secondary | ICD-10-CM | POA: Diagnosis not present

## 2020-11-22 DIAGNOSIS — I110 Hypertensive heart disease with heart failure: Secondary | ICD-10-CM | POA: Diagnosis not present

## 2020-11-22 DIAGNOSIS — Z833 Family history of diabetes mellitus: Secondary | ICD-10-CM

## 2020-11-22 DIAGNOSIS — R0602 Shortness of breath: Secondary | ICD-10-CM

## 2020-11-22 DIAGNOSIS — R059 Cough, unspecified: Secondary | ICD-10-CM

## 2020-11-22 DIAGNOSIS — Z7969 Long term (current) use of other immunomodulators and immunosuppressants: Secondary | ICD-10-CM

## 2020-11-22 DIAGNOSIS — Z801 Family history of malignant neoplasm of trachea, bronchus and lung: Secondary | ICD-10-CM

## 2020-11-22 DIAGNOSIS — E119 Type 2 diabetes mellitus without complications: Secondary | ICD-10-CM

## 2020-11-22 DIAGNOSIS — K760 Fatty (change of) liver, not elsewhere classified: Secondary | ICD-10-CM | POA: Diagnosis not present

## 2020-11-22 DIAGNOSIS — L299 Pruritus, unspecified: Secondary | ICD-10-CM | POA: Diagnosis present

## 2020-11-22 DIAGNOSIS — E1122 Type 2 diabetes mellitus with diabetic chronic kidney disease: Secondary | ICD-10-CM | POA: Diagnosis not present

## 2020-11-22 DIAGNOSIS — R609 Edema, unspecified: Secondary | ICD-10-CM | POA: Diagnosis not present

## 2020-11-22 DIAGNOSIS — E11649 Type 2 diabetes mellitus with hypoglycemia without coma: Secondary | ICD-10-CM | POA: Diagnosis present

## 2020-11-22 DIAGNOSIS — E876 Hypokalemia: Secondary | ICD-10-CM | POA: Diagnosis not present

## 2020-11-22 DIAGNOSIS — Z8505 Personal history of malignant neoplasm of liver: Secondary | ICD-10-CM

## 2020-11-22 DIAGNOSIS — J811 Chronic pulmonary edema: Secondary | ICD-10-CM | POA: Diagnosis not present

## 2020-11-22 LAB — CBC WITH DIFFERENTIAL/PLATELET
Abs Immature Granulocytes: 0.04 10*3/uL (ref 0.00–0.07)
Basophils Absolute: 0 10*3/uL (ref 0.0–0.1)
Basophils Relative: 0 %
Eosinophils Absolute: 0.1 10*3/uL (ref 0.0–0.5)
Eosinophils Relative: 2 %
HCT: 32.1 % — ABNORMAL LOW (ref 39.0–52.0)
Hemoglobin: 10 g/dL — ABNORMAL LOW (ref 13.0–17.0)
Immature Granulocytes: 1 %
Lymphocytes Relative: 9 %
Lymphs Abs: 0.4 10*3/uL — ABNORMAL LOW (ref 0.7–4.0)
MCH: 29.9 pg (ref 26.0–34.0)
MCHC: 31.2 g/dL (ref 30.0–36.0)
MCV: 95.8 fL (ref 80.0–100.0)
Monocytes Absolute: 0.4 10*3/uL (ref 0.1–1.0)
Monocytes Relative: 9 %
Neutro Abs: 3.8 10*3/uL (ref 1.7–7.7)
Neutrophils Relative %: 79 %
Platelets: 128 10*3/uL — ABNORMAL LOW (ref 150–400)
RBC: 3.35 MIL/uL — ABNORMAL LOW (ref 4.22–5.81)
RDW: 17.2 % — ABNORMAL HIGH (ref 11.5–15.5)
WBC: 4.8 10*3/uL (ref 4.0–10.5)
nRBC: 0 % (ref 0.0–0.2)

## 2020-11-22 LAB — COMPREHENSIVE METABOLIC PANEL
ALT: 12 U/L (ref 0–44)
AST: 19 U/L (ref 15–41)
Albumin: 4.3 g/dL (ref 3.5–5.0)
Alkaline Phosphatase: 72 U/L (ref 38–126)
Anion gap: 15 (ref 5–15)
BUN: 96 mg/dL — ABNORMAL HIGH (ref 8–23)
CO2: 24 mmol/L (ref 22–32)
Calcium: 9.4 mg/dL (ref 8.9–10.3)
Chloride: 95 mmol/L — ABNORMAL LOW (ref 98–111)
Creatinine, Ser: 2.25 mg/dL — ABNORMAL HIGH (ref 0.61–1.24)
GFR, Estimated: 31 mL/min — ABNORMAL LOW (ref >60)
Glucose, Bld: 209 mg/dL — ABNORMAL HIGH (ref 70–99)
Potassium: 4.1 mmol/L (ref 3.5–5.1)
Sodium: 134 mmol/L — ABNORMAL LOW (ref 135–145)
Total Bilirubin: 1.1 mg/dL (ref 0.3–1.2)
Total Protein: 6.8 g/dL (ref 6.5–8.1)

## 2020-11-22 LAB — I-STAT ARTERIAL BLOOD GAS, ED
Acid-Base Excess: 3 mmol/L — ABNORMAL HIGH (ref 0.0–2.0)
Bicarbonate: 27.2 mmol/L (ref 20.0–28.0)
Calcium, Ion: 1.19 mmol/L (ref 1.15–1.40)
HCT: 31 % — ABNORMAL LOW (ref 39.0–52.0)
Hemoglobin: 10.5 g/dL — ABNORMAL LOW (ref 13.0–17.0)
O2 Saturation: 90 %
Patient temperature: 98.6
Potassium: 3.7 mmol/L (ref 3.5–5.1)
Sodium: 135 mmol/L (ref 135–145)
TCO2: 28 mmol/L (ref 22–32)
pCO2 arterial: 38.6 mmHg (ref 32.0–48.0)
pH, Arterial: 7.456 — ABNORMAL HIGH (ref 7.350–7.450)
pO2, Arterial: 56 mmHg — ABNORMAL LOW (ref 83.0–108.0)

## 2020-11-22 LAB — RESP PANEL BY RT-PCR (FLU A&B, COVID) ARPGX2
Influenza A by PCR: NEGATIVE
Influenza B by PCR: NEGATIVE
SARS Coronavirus 2 by RT PCR: NEGATIVE

## 2020-11-22 LAB — PROTIME-INR
INR: 1.2 (ref 0.8–1.2)
Prothrombin Time: 15.2 seconds (ref 11.4–15.2)

## 2020-11-22 LAB — TROPONIN I (HIGH SENSITIVITY)
Troponin I (High Sensitivity): 37 ng/L — ABNORMAL HIGH (ref <18)
Troponin I (High Sensitivity): 36 ng/L — ABNORMAL HIGH (ref <18)

## 2020-11-22 LAB — BRAIN NATRIURETIC PEPTIDE: B Natriuretic Peptide: 352.1 pg/mL — ABNORMAL HIGH (ref 0.0–100.0)

## 2020-11-22 LAB — CBG MONITORING, ED: Glucose-Capillary: 100 mg/dL — ABNORMAL HIGH (ref 70–99)

## 2020-11-22 MED ORDER — HYDRALAZINE HCL 25 MG PO TABS: 12.5000 mg | ORAL_TABLET | ORAL | Status: DC

## 2020-11-22 MED ORDER — FUROSEMIDE 10 MG/ML IJ SOLN
80.0000 mg | Freq: Two times a day (BID) | INTRAMUSCULAR | Status: DC
  Administered 2020-11-23 – 2020-12-02 (×19): 80 mg via INTRAVENOUS
  Filled 2020-11-22 (×19): qty 8

## 2020-11-22 MED ORDER — TRIAMCINOLONE ACETONIDE 55 MCG/ACT NA AERO: 2.0000 | INHALATION_SPRAY | Freq: Every day | NASAL | Status: DC

## 2020-11-22 MED ORDER — HYDRALAZINE HCL 25 MG PO TABS
25.0000 mg | ORAL_TABLET | Freq: Every morning | ORAL | Status: DC
  Administered 2020-11-23 – 2020-12-03 (×11): 25 mg via ORAL
  Filled 2020-11-22 (×11): qty 1

## 2020-11-22 MED ORDER — ENOXAPARIN SODIUM 40 MG/0.4ML IJ SOSY
40.0000 mg | PREFILLED_SYRINGE | INTRAMUSCULAR | Status: DC
  Administered 2020-11-22 – 2020-11-30 (×9): 40 mg via SUBCUTANEOUS
  Filled 2020-11-22 (×10): qty 0.4

## 2020-11-22 MED ORDER — SODIUM CHLORIDE 0.9% FLUSH
3.0000 mL | Freq: Two times a day (BID) | INTRAVENOUS | Status: DC
  Administered 2020-11-23 – 2020-12-03 (×22): 3 mL via INTRAVENOUS

## 2020-11-22 MED ORDER — ADULT MULTIVITAMIN W/MINERALS CH
1.0000 | ORAL_TABLET | Freq: Every day | ORAL | Status: DC
  Administered 2020-11-23 – 2020-12-03 (×11): 1 via ORAL
  Filled 2020-11-22 (×11): qty 1

## 2020-11-22 MED ORDER — ASPIRIN EC 81 MG PO TBEC
81.0000 mg | DELAYED_RELEASE_TABLET | Freq: Every day | ORAL | Status: DC
  Administered 2020-11-23 – 2020-12-03 (×11): 81 mg via ORAL
  Filled 2020-11-22 (×11): qty 1

## 2020-11-22 MED ORDER — SODIUM CHLORIDE 0.9% FLUSH
3.0000 mL | INTRAVENOUS | Status: DC | PRN
  Administered 2020-11-23: 3 mL via INTRAVENOUS

## 2020-11-22 MED ORDER — INSULIN ASPART 100 UNIT/ML IJ SOLN
20.0000 [IU] | Freq: Three times a day (TID) | INTRAMUSCULAR | Status: DC
  Administered 2020-11-24: 20 [IU] via SUBCUTANEOUS

## 2020-11-22 MED ORDER — FUROSEMIDE 10 MG/ML IJ SOLN
80.0000 mg | Freq: Once | INTRAMUSCULAR | Status: AC
  Administered 2020-11-22: 80 mg via INTRAVENOUS
  Filled 2020-11-22: qty 8

## 2020-11-22 MED ORDER — ALLOPURINOL 300 MG PO TABS
150.0000 mg | ORAL_TABLET | Freq: Every day | ORAL | Status: DC
  Administered 2020-11-23 – 2020-12-03 (×11): 150 mg via ORAL
  Filled 2020-11-22 (×8): qty 1
  Filled 2020-11-22: qty 2
  Filled 2020-11-22 (×2): qty 1

## 2020-11-22 MED ORDER — HYDRALAZINE HCL 25 MG PO TABS
12.5000 mg | ORAL_TABLET | Freq: Every day | ORAL | Status: DC
  Administered 2020-11-22 – 2020-12-02 (×11): 12.5 mg via ORAL
  Filled 2020-11-22 (×11): qty 1

## 2020-11-22 MED ORDER — SODIUM CHLORIDE 0.9 % IV SOLN: 250.0000 mL | INTRAVENOUS | Status: DC | PRN

## 2020-11-22 MED ORDER — MYCOPHENOLATE MOFETIL 250 MG PO CAPS
1000.0000 mg | ORAL_CAPSULE | Freq: Two times a day (BID) | ORAL | Status: DC
  Administered 2020-11-22 – 2020-12-03 (×22): 1000 mg via ORAL
  Filled 2020-11-22 (×23): qty 4

## 2020-11-22 MED ORDER — FUROSEMIDE 10 MG/ML IJ SOLN: 80.0000 mg | Freq: Two times a day (BID) | INTRAMUSCULAR | Status: DC

## 2020-11-22 MED ORDER — SPIRONOLACTONE 25 MG PO TABS
25.0000 mg | ORAL_TABLET | Freq: Two times a day (BID) | ORAL | Status: DC
  Administered 2020-11-22 – 2020-12-03 (×22): 25 mg via ORAL
  Filled 2020-11-22 (×24): qty 1

## 2020-11-22 MED ORDER — ACETAMINOPHEN 325 MG PO TABS: 650.0000 mg | ORAL_TABLET | ORAL | Status: DC | PRN

## 2020-11-22 MED ORDER — GEMFIBROZIL 600 MG PO TABS
300.0000 mg | ORAL_TABLET | Freq: Two times a day (BID) | ORAL | Status: DC
  Administered 2020-11-22 – 2020-12-03 (×22): 300 mg via ORAL
  Filled 2020-11-22 (×24): qty 0.5

## 2020-11-22 MED ORDER — INSULIN ASPART 100 UNIT/ML IJ SOLN: 0.0000 [IU] | Freq: Three times a day (TID) | INTRAMUSCULAR | Status: DC

## 2020-11-22 NOTE — ED Provider Notes (Signed)
  Face-to-face evaluation   History: He presents for feeling of fluid overload.  He has gained about 15 pounds in the last 8 weeks.  He continues to use his same diuretic medications.  He was recently evaluated at an outlying hospital, was recommended for admission but decided to leave AMA because of long wait.  He denies shortness of breath.  He states he has a hyperdynamic cardiac function, but not history of congestive heart failure.  He is a liver transplant patient, is currently on Prograf.  He states his liver this been doing well.  He denies shortness of breath, chest pain, weakness or dizziness.  He had a toenail avulsion, about 2 weeks ago and 1 week ago he had a germinal matrix ablation, and has been dressing the wound daily.  This is the right great toe.  He denies fever, chills, nausea or vomiting.    Physical exam: Alert elderly male.  He is calm comfortable.  He is lucid.  No respiratory distress.  Legs with 3-4+ pitting edema bilaterally.  Right great toe has a healing nailbed with dried blood on it but no drainage, swelling or significant tenderness.  4:30 PM-discussed with internal medicine teaching service resident who will admit the patient  Medical screening examination/treatment/procedure(s) were conducted as a shared visit with non-physician practitioner(s) and myself.  I personally evaluated the patient during the encounter    Daleen Bo, MD 11/22/20 [phone]

## 2020-11-22 NOTE — ED Provider Notes (Addendum)
Emergency Medicine Provider Triage Evaluation Note  Gregory Tanner , a 65 y.o. male  was evaluated in triage.  Pt complains of leg swelling.  The patient reports worsening leg swelling, abdominal distention, shortness of breath both with rest and exertion that has been worsening over the last 2 weeks.  He has had a 10 pound weight gain.  He has his weight log with him in the emergency department.  He has a history of a liver transplant and has been followed for worsening CKD.  He also reports that he has had decreased urinary output over the last few days.  No chest pain, fever, chills, vomiting, diarrhea, palpitations, rash.  He has a history of 2 prior paracentesis.  Last paracentesis was approximately 1 year ago and he believes that they removed 2 L.  Review of Systems  Positive: Shortness of breath, abdominal distention, leg swelling Negative: Chest pain, fever, chills, vomiting, diarrhea, palpitations, rash  Physical Exam  BP 139/65   Pulse 93   Temp 97.8 F (36.6 C) (Oral)   Resp (!) 22   Ht 5\' 10"  (1.778 m)   Wt 92.1 kg   SpO2 98%   BMI 29.13 kg/m  Gen:   Awake, no distress   Resp:  Normal effort  MSK:   Moves extremities without difficulty  Other:  Abdomen is firm and distended.  He has 3+ pitting edema noted to the bilateral lower legs, extending to the knees.  Wheezes and rhonchi auscultated in the bilateral lung bases. + JVD  Medical Decision Making  Medically screening exam initiated at 6:20 AM.  Appropriate orders placed.  [name] was informed that the remainder of the evaluation will be completed by another provider, this initial triage assessment does not replace that evaluation, and the importance of remaining in the ED until their evaluation is complete.  Labs and imaging have been initiated.  He will require further work-up and evaluation in the emergency department.   Joline Maxcy A, PA-C 11/22/20 0621    Joanne Gavel, PA-C 11/22/20 9150     Orpah Greek, MD 11/23/20 [phone]

## 2020-11-22 NOTE — ED Notes (Signed)
Visitor at bedside. No complaints or request from pt. Pharmacy in to see

## 2020-11-22 NOTE — ED Notes (Signed)
Dinner Ordered 

## 2020-11-22 NOTE — ED Provider Notes (Signed)
[facility] EMERGENCY DEPARTMENT Provider Note   CSN: [id number] Arrival date & time: 11/22/20  1914     History Chief Complaint  Patient presents with   Fluid Retention     Gregory Tanner is a 65 y.o. male.  66 year old male with history of chronic diastolic heart failure, liver transplant, DM. Presents with worsening heart failure.  Sees Dr. Sharol Roussel from [facility] for HF, presents with worsening fluid overload x 2 weeks. Retention has been problematic for years, failed multiple dieretics. Currently on spironolactone an metolazone. Also reports SHOB with white, thick sputum, yellow sputum in the morning when he coughs. Worsening cough and SHOB over the past 2 weeks with 10+ weight gain, keeps daily weight log. Abdomen is distended at baseline, more firm today with worsening leg swelling. Seen in ED in [place] 2 days ago, didn't feel they could treat him there, called Dr. Sharol Roussel who recommended he go to the ER.       Past Medical History:  Diagnosis Date   Ankylosis of lumbar spine 02/21/2014   Benign essential hypertension 10/26/2015   BP (high blood pressure) 12/27/2010   Chronic diastolic heart failure ([place]) 09/26/2016   Chronic pain associated with significant psychosocial dysfunction 06/17/2015   Chronic pain disorder 10/28/2015   CKD (chronic kidney disease) stage 3, GFR 30-59 ml/min (HCC) 03/27/2017   Degeneration of intervertebral disc of lumbar region 07/05/2013   Degenerative disc disease, lumbar 07/26/2012   Diabetes mellitus without complication ([place])    Encounter for other specified prophylactic measures 10/10/2011   Excess weight 10/10/2011   Hearing loss    Heart failure, diastolic, acute on chronic ([place]) 07/24/2019   Hepatocellular carcinoma ([place]) 12/27/2010   Overview:  Embolized 6/12    History of liver transplant ([place]) 07/20/2011   Overview:  06/07/2011 for cryptogenic cirrhosis and [place]    Hypertension    Hypertensive heart disease with heart failure ([place])  10/26/2015   Hypokalemia    Liver disease    Lumbar radiculopathy 10/10/2012   Nerve root pain 06/17/2015   Overview:  Right    SOB (shortness of breath) on exertion 10/28/2015   Swelling    Type 2 diabetes mellitus ([place]) 12/27/2010   Overview:  H1C 6.5 12/2010    Uncontrolled type 2 diabetes mellitus with insulin therapy ([place]) 06/21/2018    Patient Active Problem List   Diagnosis Date Noted   Volume overload 01/22/2020   PHT (pulmonary hypertension) ([place]) 01/19/2020   Swelling    Liver disease    Hypertension    Hearing loss    Diabetes mellitus without complication ([place])    Hypokalemia    Heart failure, diastolic, acute on chronic ([place]) 07/24/2019   Uncontrolled type 2 diabetes mellitus with insulin therapy 06/21/2018   CKD (chronic kidney disease) stage 3, GFR 30-59 ml/min (HCC) 03/27/2017   Chronic diastolic heart failure ([place]) 09/26/2016   Chronic pain disorder 10/28/2015   SOB (shortness of breath) on exertion 10/28/2015   Hypertensive heart disease with heart failure ([place]) 10/26/2015   Benign essential hypertension 10/26/2015   Chronic pain associated with significant psychosocial dysfunction 06/17/2015   Nerve root pain 06/17/2015   Ankylosis of lumbar spine 02/21/2014   Degeneration of intervertebral disc of lumbar region 07/05/2013   Lumbar radiculopathy 10/10/2012   Degenerative disc disease, lumbar 07/26/2012   Encounter for other specified prophylactic measures 10/10/2011   Excess weight 10/10/2011   History of liver transplant ([place]) 07/20/2011   Type 2  diabetes mellitus ([place]) 12/27/2010   Hepatocellular carcinoma ([place]) 12/27/2010   BP (high blood pressure) 12/27/2010    Past Surgical History:  Procedure Laterality Date   BACK SURGERY     BAKER CYST SURGERY     RIGHT LEG   EYE SURGERY Left    HERNIA REPAIR     LIVER SURGERY     IMPLANT   LIVER TRANSPLANT     TONSILL SURGERY     TONSILLECTOMY         Family History  Problem Relation Age of Onset    Cancer Mother    Diabetes Father    Heart attack Father    Schizophrenia Father    Hypertension Brother     Social History   Tobacco Use   Smoking status: Never   Smokeless tobacco: Never  Vaping Use   Vaping Use: Never used  Substance Use Topics   Alcohol use: Yes    Alcohol/week: 0.0 standard drinks    Comment: ocassional   Drug use: Not Currently    Home Medications Prior to Admission medications   Medication Sig Start Date End Date Taking? Authorizing Provider  allopurinol (ZYLOPRIM) 300 MG tablet Take 150 mg by mouth daily.  08/12/16  Yes [provider]  aspirin 81 MG tablet Take 81 mg by mouth daily.   Yes [provider]  bumetanide (BUMEX) 2 MG tablet Take 1.5 tablets (3 mg total ) by mouth daily in the morning, and one tablet (2 mg total) by mouth daily in the evening. Patient taking differently: Take 3 mg by mouth See admin instructions. Take 1.5 tablets (3 mg total ) by mouth daily in the morning, and 1.5 tablets (3 mg total) by mouth daily in the evening. 08/29/19  Yes Richardo Priest, MD  gemfibrozil (LOPID) 600 MG tablet TAKE 1/2 TABLET BY MOUTH TWICE A DAY BEFORE MEALS Patient taking differently: Take 300 mg by mouth in the morning and at bedtime. 07/06/20  Yes Richardo Priest, MD  hydrALAZINE (APRESOLINE) 25 MG tablet TAKE 1/2 TABLET BY MOUTH 3 TIMES DAILY IF YOUR SYSTOLIC BLOOD PRESSURE (TOP NUMBER) IS GREATER THAN 140 Patient taking differently: Take 12.5-25 mg by mouth See admin instructions. Take 1 tablet (25 mg) in the morning and half a tablet (12.5 mg) before bedtime 02/21/20  Yes Revankar, Reita Cliche, MD  insulin lispro (HUMALOG) 100 UNIT/ML KwikPen Inject 20-30 Units into the skin with breakfast, with lunch, and with evening meal. 09/19/20  Yes [provider]  metolazone (ZAROXOLYN) 5 MG tablet Take 5 mg by mouth See admin instructions. Take 1 tablet by mouth on Monday, Wednesday, Friday, and Saturday   Yes [provider]   Multiple Vitamin (MULTIVITAMIN) tablet Take 1 tablet by mouth daily.   Yes [provider]  mycophenolate (CELLCEPT) 250 MG capsule Take 1,000 mg by mouth in the morning and at bedtime.   Yes [provider]  potassium chloride SA (KLOR-CON) 20 MEQ tablet Take 20 mEq by mouth 2 (two) times daily. 01/20/20  Yes [provider]  spironolactone (ALDACTONE) 25 MG tablet Take 25 mg by mouth 2 (two) times daily. 01/28/20 01/27/21 Yes [provider]  TART CHERRY PO Take by mouth.   Yes [provider]  Continuous Blood Gluc Receiver ([place]) DEVI Use 1 each as directed 09/24/20   [provider]  Continuous Blood Gluc Sensor (DEXCOM G6 SENSOR) MISC Use 1 each every 10 (ten) days 09/24/20   [provider]  Continuous Blood Gluc Transmit (DEXCOM G6 TRANSMITTER) MISC Use 1 each every 3 (three) months 09/24/20   [provider]  ipratropium (ATROVENT) 0.06 % nasal spray Use 2 sprays in each nostril 2-4 times daily Patient not taking: Reported on 11/22/2020 07/01/20   Kennith Gain, MD  triamcinolone (NASACORT) 55 MCG/ACT AERO nasal inhaler Place 2 sprays into the nose daily. 2 sprays each nostril at night Patient not taking: Reported on 11/22/2020 10/07/20   Rozetta Nunnery, MD    Allergies    Iodinated diagnostic agents, Latex, Metformin and related, and Rosiglitazone  Review of Systems   Review of Systems  Constitutional:  Negative for chills and fever.  Respiratory:  Positive for cough and shortness of breath.   Cardiovascular:  Positive for leg swelling. Negative for chest pain.  Gastrointestinal:  Negative for abdominal pain, constipation, diarrhea, nausea and vomiting.  Genitourinary:  Negative for decreased urine volume and difficulty urinating.  Musculoskeletal:  Positive for myalgias.  Skin:  Negative for wound.  Allergic/Immunologic: Positive for immunocompromised state.  Neurological:   Negative for weakness.  Hematological:  Negative for adenopathy.  Psychiatric/Behavioral:  Negative for confusion.   All other systems reviewed and are negative.  Physical Exam Updated Vital Signs BP 114/63   Pulse 89   Temp 97.8 F (36.6 C) (Oral)   Resp 18   Ht 5\' 10"  (1.778 m)   Wt 92.1 kg   SpO2 99%   BMI 29.13 kg/m   Physical Exam Vitals and nursing note reviewed.  Constitutional:      General: He is not in acute distress.    Appearance: He is well-developed. He is not diaphoretic.  HENT:     Head: Normocephalic and atraumatic.     Mouth/Throat:     Mouth: Mucous membranes are moist.  Eyes:     Conjunctiva/sclera: Conjunctivae normal.  Cardiovascular:     Rate and Rhythm: Normal rate and regular rhythm.     Pulses: Normal pulses.     Heart sounds: Murmur heard.  Pulmonary:     Effort: Pulmonary effort is normal.     Breath sounds: Examination of the right-lower field reveals rhonchi. Examination of the left-lower field reveals rhonchi. Rhonchi present.     Comments: Able to speak in complete sentences. Occasional dry cough.  Abdominal:     General: There is distension.     Palpations: There is fluid wave.     Tenderness: There is no abdominal tenderness.  Musculoskeletal:     Right lower leg: Edema present.     Left lower leg: Edema present.  Skin:    General: Skin is warm and dry.     Findings: No bruising.     Comments: Pitting edema to lower extremities with chronic skin changes, right worse than left  Neurological:     Mental Status: He is alert and oriented to person, place, and time.  Psychiatric:        Behavior: Behavior normal.    ED Results / Procedures / Treatments   Labs (all labs ordered are listed, but only abnormal results are displayed) Labs Reviewed  BRAIN NATRIURETIC PEPTIDE - Abnormal; Notable for the following components:      Result Value   B Natriuretic Peptide 352.1 (*)    All other components within normal limits  CBC WITH  DIFFERENTIAL/PLATELET - Abnormal; Notable for the following components:   RBC 3.35 (*)    Hemoglobin 10.0 (*)    HCT  32.1 (*)    RDW 17.2 (*)    Platelets 128 (*)    Lymphs Abs 0.4 (*)    All other components within normal limits  COMPREHENSIVE METABOLIC PANEL - Abnormal; Notable for the following components:   Sodium 134 (*)    Chloride 95 (*)    Glucose, Bld 209 (*)    BUN 96 (*)    Creatinine, Ser 2.25 (*)    GFR, Estimated 31 (*)    All other components within normal limits  CBG MONITORING, ED - Abnormal; Notable for the following components:   Glucose-Capillary 100 (*)    All other components within normal limits  RESP PANEL BY RT-PCR (FLU A&B, COVID) [id number]  PROTIME-INR    EKG None  Radiology DG Chest 2 View  Result Date: 11/22/2020 CLINICAL DATA:  65 year old male with history of shortness of breath. EXAM: CHEST - 2 VIEW COMPARISON:  Chest x-ray 11/20/2020. FINDINGS: Diffuse peribronchial cuffing. Small right pleural effusion with opacity at the right base, favored to reflect subsegmental atelectasis. No definite consolidative airspace disease. No left pleural effusion. No pneumothorax. No definite suspicious appearing pulmonary nodules or masses are noted. Cephalization of the pulmonary vasculature with mild indistinctness of interstitial markings. Mild cardiomegaly. Upper mediastinal contours are within normal limits. Atherosclerotic calcifications in the thoracic aorta. IMPRESSION: 1. Cardiomegaly with pulmonary venous congestion. There is also very mild indistinctness of the interstitial markings, which could suggest developing interstitial pulmonary edema. Clinical correlation for signs and symptoms of congestive heart failure is recommended. 2. Stable small right pleural effusion with probable subsegmental atelectasis in the right lung base. 3. Aortic atherosclerosis. Electronically Signed   By: Vinnie Langton M.D.   On: 11/22/2020 06:46    Procedures Procedures    Medications Ordered in ED Medications  furosemide (LASIX) injection 80 mg (has no administration in time range)    ED Course  I have reviewed the triage vital signs and the nursing notes.  Pertinent labs & imaging results that were available during my care of the patient were reviewed by me and considered in my medical decision making (see chart for details).  Clinical Course as of 11/22/20 1537  Sun Oct 09, 263  [id number] 65 year old male with complaint of worsening edema, SHOB, weight gain, not responding to home treatment. Reports when his symptoms get to this point he typically needs admission for dieretics.  Found to have pitting edema to legs, right worse than left which is typical for him, no history of DVT. Ascites with a non tender abdomen, reports prior paracentesis. CXR with cardiomegaly with pulmonary venous congestion with mild indistinctness of the interstitial markings concerning for developing pulmonary edema.  Discussed with Dr. Eulis Foster, ER attending who has seen the patient, recommends IV Lasix and consult for admission.  [LM]    Clinical Course User Index [LM] Roque Lias   MDM Rules/Calculators/A&P                            Final Clinical Impression(s) / ED Diagnoses Final diagnoses:  Acute on chronic diastolic heart failure ([place])  Shortness of breath  Other ascites    Rx / DC Orders ED Discharge Orders     None        Tacy Learn, PA-C 11/22/20 1537    Daleen Bo, MD 11/22/20 [phone]

## 2020-11-22 NOTE — H&P (Signed)
Date: 11/22/2020               Patient Name:  Gregory Tanner MRN: [id number]  DOB: March 01, 1955 Age / Sex: 65 y.o., male   PCP: Ronita Hipps, MD         Medical Service: Internal Medicine Teaching Service         Attending Physician: Dr. Lucious Groves, DO    First Contact: Dr. Lorin Glass Pager: [phone]  Second Contact: Dr. Lisabeth Devoid Pager: [phone]       After Hours (After 5p/  First Contact Pager: [phone]  weekends / holidays): Second Contact Pager: [phone]   Chief Complaint: volume overload  History of Present Illness: Gregory Tanner is a 65 y.o. male with a PMHx of chronic diastolic HF, CKD stage 3B, HCC s/p liver transplant, T2DM, BPH, and HTN who presents here today with fluid retention.   The patient states that he noticed right-sided abdominal pain spreading to his back about 1.5 weeks ago.  Ever since then, he has noticed that his legs have gotten larger size, and he has been unable to put on pants or socks.  He denies any dietary changes in the past month.  He has been compliant with his heart failure medications at home.  Denies recent illness or exposure to sick contacts. +orthopnea.  He states that he has also noticed some weight gain over the past couple weeks.  Endorses a continuous cough in the last week to the point of dry heaving.  This mostly occurs in the morning, and he has some yellow sputum, otherwise phlegm throughout the day.  Denies fevers, chills.  Patient states that he was seen a couple days ago at [facility] ED, however all they told him was "that he did not have a heart attack."  He is followed by Dr. Sharol Roussel from [facility] for his heart failure. He states that he has been hospitalized for heart failure exacerbation in the past.  He has also undergone paracentesis, but his wife states that this was years ago.  Endorses dysuria occasionally while urinating.  He has been worked up for this in the past and states that this was "not a kidney infection."  He has never  had a UTI.  He states that a couple weeks ago he hit his right great toe and the toenail fell off.  He was eventually seen by his primary care, who put him on antibiotics for 10 days.  His last day of the course was 2 days ago, he is unsure which medication it was.  Otherwise, his wife has been putting Neosporin on it at home.  Meds:  Current Meds  Medication Sig   allopurinol (ZYLOPRIM) 300 MG tablet Take 150 mg by mouth daily.    aspirin 81 MG tablet Take 81 mg by mouth daily.   bumetanide (BUMEX) 2 MG tablet Take 1.5 tablets (3 mg total ) by mouth daily in the morning, and one tablet (2 mg total) by mouth daily in the evening. (Patient taking differently: Take 3 mg by mouth See admin instructions. Take 1.5 tablets (3 mg total ) by mouth daily in the morning, and 1.5 tablets (3 mg total) by mouth daily in the evening.)   gemfibrozil (LOPID) 600 MG tablet TAKE 1/2 TABLET BY MOUTH TWICE A DAY BEFORE MEALS (Patient taking differently: Take 300 mg by mouth in the morning and at bedtime.)   hydrALAZINE (APRESOLINE) 25 MG tablet TAKE 1/2 TABLET BY MOUTH 3  TIMES DAILY IF YOUR SYSTOLIC BLOOD PRESSURE (TOP NUMBER) IS GREATER THAN 140 (Patient taking differently: Take 12.5-25 mg by mouth See admin instructions. Take 1 tablet (25 mg) in the morning and half a tablet (12.5 mg) before bedtime)   insulin lispro (HUMALOG) 100 UNIT/ML KwikPen Inject 20-30 Units into the skin with breakfast, with lunch, and with evening meal.   metolazone (ZAROXOLYN) 5 MG tablet Take 5 mg by mouth See admin instructions. Take 1 tablet by mouth on Monday, Wednesday, Friday, and Saturday   Multiple Vitamin (MULTIVITAMIN) tablet Take 1 tablet by mouth daily.   mycophenolate (CELLCEPT) 250 MG capsule Take 1,000 mg by mouth in the morning and at bedtime.   potassium chloride SA (KLOR-CON) 20 MEQ tablet Take 20 mEq by mouth 2 (two) times daily.   spironolactone (ALDACTONE) 25 MG tablet Take 25 mg by mouth 2 (two) times daily.   TART  CHERRY PO Take by mouth.     Allergies: Allergies as of 11/22/2020 - Review Complete 11/22/2020  Allergen Reaction Noted   Iodinated diagnostic agents Hives and Other (See Comments) 11/17/2014   Latex Other (See Comments) 06/17/2015   Metformin and related Nausea And Vomiting 03/27/2017   Rosiglitazone Nausea And Vomiting 10/26/2015   Past Medical History:  Diagnosis Date   Ankylosis of lumbar spine 02/21/2014   Benign essential hypertension 10/26/2015   BP (high blood pressure) 12/27/2010   Chronic diastolic heart failure ([place]) 09/26/2016   Chronic pain associated with significant psychosocial dysfunction 06/17/2015   Chronic pain disorder 10/28/2015   CKD (chronic kidney disease) stage 3, GFR 30-59 ml/min (HCC) 03/27/2017   Degeneration of intervertebral disc of lumbar region 07/05/2013   Degenerative disc disease, lumbar 07/26/2012   Diabetes mellitus without complication ([place])    Encounter for other specified prophylactic measures 10/10/2011   Excess weight 10/10/2011   Hearing loss    Heart failure, diastolic, acute on chronic ([place]) 07/24/2019   Hepatocellular carcinoma ([place]) 12/27/2010   Overview:  Embolized 6/12    History of liver transplant (Home) 07/20/2011   Overview:  06/07/2011 for cryptogenic cirrhosis and HCC    Hypertension    Hypertensive heart disease with heart failure ([place]) 10/26/2015   Hypokalemia    Liver disease    Lumbar radiculopathy 10/10/2012   Nerve root pain 06/17/2015   Overview:  Right    SOB (shortness of breath) on exertion 10/28/2015   Swelling    Type 2 diabetes mellitus ([place]) 12/27/2010   Overview:  H1C 6.5 12/2010    Uncontrolled type 2 diabetes mellitus with insulin therapy ([place]) 06/21/2018    Family History: Father deceased from MI, prediabetes. Mother with small cell lung cancer  Social History: Lives at home with wife.  Independent of ADLs at home. Denies smoking, illicit drug use. Has two small glasses of wine a week  Review of Systems: A complete  ROS was negative except as per HPI.   Physical Exam: Blood pressure 114/63, pulse 82, temperature 97.7 F (36.5 C), resp. rate 17, height 5\' 10"  (1.778 m), weight 92.1 kg, SpO2 96 %. Physical Exam Constitutional:      Appearance: Normal appearance.  HENT:     Head: Normocephalic and atraumatic.  Eyes:     Extraocular Movements: Extraocular movements intact.     Pupils: Pupils are equal, round, and reactive to light.  Cardiovascular:     Rate and Rhythm: Normal rate and regular rhythm.     Heart sounds: Murmur heard.    No  friction rub. No gallop.     Comments: +JVD Pulmonary:     Effort: Pulmonary effort is normal. No respiratory distress.     Breath sounds: Rhonchi present.  Chest:     Chest wall: No tenderness.  Abdominal:     General: Bowel sounds are normal. There is distension.     Tenderness: There is no abdominal tenderness.  Musculoskeletal:        General: No tenderness.     Right lower leg: Edema present.     Left lower leg: Edema present.     Comments: 2+ bilateral pitting edema, R>L. Ruddy skin changes R>L. Right leg is warm to touch, left leg is cool to touch.   Skin:    General: Skin is warm and dry.     Comments: Right great toe without nail but with overlying dried blood. No active bleeding, tender to touch   Neurological:     General: No focal deficit present.     Mental Status: He is alert and oriented to person, place, and time. Mental status is at baseline.     Cranial Nerves: No cranial nerve deficit.  Psychiatric:        Mood and Affect: Mood normal.        Behavior: Behavior normal.     EKG: personally reviewed my interpretation is pending  CXR: personally reviewed my interpretation is cardiomegaly with pulmonary venous congestion and interstitial markings  Assessment & Plan by Problem: Active Problems:   Acute exacerbation of CHF (congestive heart failure) (HCC)  #HFpEF exacerbation Patient has a history of heart failure for which she is  followed by Dr. Sharol Roussel at [facility]  His most recent echo was in May 2021, showing EF 60-65% with normal left ventricular function.  He endorses having CHF exacerbations in the past.  He presented to the ED with a 1.5-week history of increased abdominal girth and worsening swelling in the bilateral lower extremities.  He has been compliant with his home regimen of Bumex 1.5 mg twice daily, metolazone 5 mg on M/W/F/Sat, and spironolactone 25 mg twice daily.  No dietary indiscretion, no recent sick contacts.  In the ED, patient was afebrile and HDS.  Exam remarkable for JVD, rhonchi on pulmonary exam, distended abdomen, and 2+ bilateral pitting edema.  WBC 4.8, Cr 2.25 (up from baseline around 2), BNP 352.  No recent progression of CKD.  Chest x-ray revealed cardiomegaly with pulmonary venous congestion and interstitial markings.  He has already received 80 mg of Lasix in the ED.  Unclear precipitant, however will obtain troponins and EKG given that MI could be a possible precipitant.  Otherwise, will continue diuresis as below. Given his significant lower extremity edema, we will also obtain LE vascular ultrasound to evaluate for DVT. -IV Lasix 80 mg twice daily -Strict I's/O's -Daily weights -Continue spironolactone 25 mg twice daily -Echocardiogram pending -Vascular LE [place] pending -Troponins pending -EKG pending -Cardiac monitoring  #T2DM Patient is on 20-30 units of Humalog with meals, no basal dose.  Most recent A1c of 6.0 in April 2022.  Most recent CBG 100. -NovoLog 20 units 3 times daily with meals -SSI, moderate -CBG q4h  #CKD stage IIIb Baseline creatinine ~2.  Creatinine today of 2.25, GFR 31. -Trend BMP -Avoid nephrotoxic meds, including ACE/ARB  #H/o HCC s/p liver transplant Patient states that he underwent liver transplant 9 years ago.  He has not had any issues since. He is on mycophenolate at home. -Continue mycophenolate 1000 mg p.o.  twice daily  #HTN BP of 120/54, has been  stable since arrival. -Continue hydralazine 25 mg p.o. every morning and 12.5 mg p.o. at bedtime.  #HLD -Continue gemfibrozil 300 mg p.o. twice daily  #Gout -Continue allopurinol 150 mg p.o. daily  Dispo: Admit patient to Inpatient with expected length of stay greater than 2 midnights.  Signed: [name], MD 11/22/2020, 5:57 PM  Pager: [phone]  After 5pm on weekdays and 1pm on weekends: On Call pager: [phone]

## 2020-11-22 NOTE — ED Triage Notes (Signed)
Pt c/o recurrent fluid retention without improvement with 3 different fluid pills. Now c/o SOB at rest and worsening with exertion, abdominal distention, and +4 bilateral leg edema.Report 10 lb weight gain over the past 2 weeks. Per note pt has weight log. Pt post liver transplant had been told his kidney function is decreasing. Has not noted decrease urinary output.

## 2020-11-22 NOTE — ED Notes (Signed)
Pt requesting his home medication K+, tells this RN he takes it BID, this RN reached out to attending Lisabeth Devoid MD . Awaiting orders.

## 2020-11-22 NOTE — ED Notes (Signed)
Admitting doctors at the bedside 

## 2020-11-23 ENCOUNTER — Inpatient Hospital Stay (HOSPITAL_COMMUNITY): Payer: Medicare Other

## 2020-11-23 DIAGNOSIS — R609 Edema, unspecified: Secondary | ICD-10-CM

## 2020-11-23 DIAGNOSIS — I5033 Acute on chronic diastolic (congestive) heart failure: Secondary | ICD-10-CM

## 2020-11-23 DIAGNOSIS — D509 Iron deficiency anemia, unspecified: Secondary | ICD-10-CM | POA: Diagnosis present

## 2020-11-23 DIAGNOSIS — I5031 Acute diastolic (congestive) heart failure: Secondary | ICD-10-CM

## 2020-11-23 DIAGNOSIS — D696 Thrombocytopenia, unspecified: Secondary | ICD-10-CM | POA: Diagnosis present

## 2020-11-23 LAB — IRON AND TIBC
Iron: 51 ug/dL (ref 45–182)
Saturation Ratios: 13 % — ABNORMAL LOW (ref 17.9–39.5)
TIBC: 406 ug/dL (ref 250–450)
UIBC: 355 ug/dL

## 2020-11-23 LAB — CBG MONITORING, ED
Glucose-Capillary: 101 mg/dL — ABNORMAL HIGH (ref 70–99)
Glucose-Capillary: 113 mg/dL — ABNORMAL HIGH (ref 70–99)
Glucose-Capillary: 117 mg/dL — ABNORMAL HIGH (ref 70–99)
Glucose-Capillary: 130 mg/dL — ABNORMAL HIGH (ref 70–99)
Glucose-Capillary: 142 mg/dL — ABNORMAL HIGH (ref 70–99)
Glucose-Capillary: 210 mg/dL — ABNORMAL HIGH (ref 70–99)

## 2020-11-23 LAB — ECHOCARDIOGRAM COMPLETE
AR max vel: 3.14 cm2
AV Area VTI: 3.31 cm2
AV Area mean vel: 3.17 cm2
AV Mean grad: 13 mmHg
AV Peak grad: 23.4 mmHg
Ao pk vel: 2.42 m/s
Area-P 1/2: 2.72 cm2
Height: 70 in
MV VTI: 3.58 cm2
S' Lateral: 3.1 cm
Weight: 3248 oz

## 2020-11-23 LAB — CBC
HCT: 30.5 % — ABNORMAL LOW (ref 39.0–52.0)
Hemoglobin: 9.7 g/dL — ABNORMAL LOW (ref 13.0–17.0)
MCH: 30 pg (ref 26.0–34.0)
MCHC: 31.8 g/dL (ref 30.0–36.0)
MCV: 94.4 fL (ref 80.0–100.0)
Platelets: 121 10*3/uL — ABNORMAL LOW (ref 150–400)
RBC: 3.23 MIL/uL — ABNORMAL LOW (ref 4.22–5.81)
RDW: 17.1 % — ABNORMAL HIGH (ref 11.5–15.5)
WBC: 5.6 10*3/uL (ref 4.0–10.5)
nRBC: 0 % (ref 0.0–0.2)

## 2020-11-23 LAB — BASIC METABOLIC PANEL
Anion gap: 14 (ref 5–15)
BUN: 103 mg/dL — ABNORMAL HIGH (ref 8–23)
CO2: 25 mmol/L (ref 22–32)
Calcium: 9.3 mg/dL (ref 8.9–10.3)
Chloride: 95 mmol/L — ABNORMAL LOW (ref 98–111)
Creatinine, Ser: 2.56 mg/dL — ABNORMAL HIGH (ref 0.61–1.24)
GFR, Estimated: 27 mL/min — ABNORMAL LOW (ref >60)
Glucose, Bld: 143 mg/dL — ABNORMAL HIGH (ref 70–99)
Potassium: 3.7 mmol/L (ref 3.5–5.1)
Sodium: 134 mmol/L — ABNORMAL LOW (ref 135–145)

## 2020-11-23 LAB — GLUCOSE, CAPILLARY: Glucose-Capillary: 139 mg/dL — ABNORMAL HIGH (ref 70–99)

## 2020-11-23 LAB — MAGNESIUM: Magnesium: 2.5 mg/dL — ABNORMAL HIGH (ref 1.7–2.4)

## 2020-11-23 LAB — FERRITIN: Ferritin: 78 ng/mL (ref 24–336)

## 2020-11-23 MED ORDER — POTASSIUM CHLORIDE CRYS ER 20 MEQ PO TBCR
20.0000 meq | EXTENDED_RELEASE_TABLET | Freq: Two times a day (BID) | ORAL | Status: DC
  Administered 2020-11-23 – 2020-11-24 (×3): 20 meq via ORAL
  Filled 2020-11-23 (×3): qty 1

## 2020-11-23 MED ORDER — SODIUM CHLORIDE 0.9 % IV SOLN: 1000.0000 mg | Freq: Once | INTRAVENOUS | Status: DC

## 2020-11-23 MED ORDER — INSULIN ASPART 100 UNIT/ML IJ SOLN
0.0000 [IU] | Freq: Three times a day (TID) | INTRAMUSCULAR | Status: DC
  Administered 2020-11-24 – 2020-11-26 (×3): 2 [IU] via SUBCUTANEOUS
  Administered 2020-11-28 (×2): 3 [IU] via SUBCUTANEOUS
  Administered 2020-11-29: 2 [IU] via SUBCUTANEOUS
  Administered 2020-11-29: 3 [IU] via SUBCUTANEOUS

## 2020-11-23 MED ORDER — METOLAZONE 5 MG PO TABS
5.0000 mg | ORAL_TABLET | Freq: Once | ORAL | Status: AC
  Administered 2020-11-23: 5 mg via ORAL
  Filled 2020-11-23: qty 1

## 2020-11-23 MED ORDER — SODIUM CHLORIDE 0.9 % IV SOLN
25.0000 mg | Freq: Once | INTRAVENOUS | Status: AC
  Administered 2020-11-23: 25 mg via INTRAVENOUS
  Filled 2020-11-23: qty 0.5

## 2020-11-23 NOTE — Progress Notes (Signed)
Lower extremity venous right study completed.   Please see CV Proc for preliminary results.   Krikor Willet, RDMS, RVT  

## 2020-11-23 NOTE — ED Notes (Signed)
Called pharmacy regarding pt's insulin orders. Per pharmacy okay to give total of 25 units novolog to pt between daily order and meal coverage order for CBG of 210.

## 2020-11-23 NOTE — Progress Notes (Signed)
Pt states he takes Potassium 58mEq twice a day. Pt's home med rec has been updated.

## 2020-11-23 NOTE — Progress Notes (Signed)
Echocardiogram 2D Echocardiogram has been performed.  Oneal Deputy Keilan Nichol RDCS 11/23/2020, 9:34 AM

## 2020-11-23 NOTE — Progress Notes (Signed)
Subjective: The patient was seen at bedside rounds with his wife present. He states that his swelling has been about the same and he was admitted. Endorses urinating about 2 L since he was admitted to the ED. He still has some difficulty lying flat due to worsening SOB. He reports that he was on Jardiance in the past, however he developed blisters with the medication, and the stopped after he discontinued taking it. This happened about 6 months ago. Denies having bloody bowel movements. No other complaints or concerns today.  Objective:  Vital signs in last 24 hours: Vitals:   11/23/20 0715 11/23/20 0730 11/23/20 0745 11/23/20 0800  BP:    125/67  Pulse: 90 88 95 99  Resp: (!) 21 (!) 28 (!) 23 (!) 24  Temp:      TempSrc:      SpO2: 97% 95% 99% 99%  Weight:      Height:        General: NAD, nl appearance HE: Normocephalic, atraumatic, EOMI, Conjunctivae normal ENT: No congestion, no rhinorrhea, no exudate or erythema  Cardiovascular: Normal rate, regular rhythm. No murmurs, rubs, or gallops.  Palpable pedal pulses. Pulmonary : Effort normal, rhonchi but no wheezing or rales Abdominal: Distended, nontender, bowel sounds present. Overall, unchanged abdominal exam compared to yesterday. Musculoskeletal: no deformity, injury, or tenderness in extremities Skin: Warm, dry, 2+ bilateral pitting edema, R>L.  Ruddy skin changes, R>L.   Psychiatric/Behavioral: normal mood, normal behavior    Assessment/Plan:  Active Problems:   Type 2 diabetes mellitus ([place])   History of liver transplant ([place])   Acute exacerbation of CHF (congestive heart failure) (HCC)   Thrombocytopenia (HCC)  #HFpEF exacerbation Patient has a history of heart failure for which she is followed by Dr. Sharol Roussel at [facility]. Most recent echo performed today shows EF 65-75%, now has G2DD which is new from echo in May 2021. Net -1.1 L since admit, though patient does endorse having more than 2 L urine output since has been here.  Weight on admit of 92.1 kg. Will continue diuresis as below. Given his significant lower extremity edema, we will also obtain LE vascular ultrasound to evaluate for DVT. -IV Lasix 80 mg twice daily, s/p 1x metolazone 5 mg -Strict I's/O's -Daily weights -Continue spironolactone 25 mg twice daily -Vascular LE [place] pending -Cardiac monitoring   #T2DM Patient is on 20-30 units of Humalog with meals, no basal dose. Most recent A1c of 6.0 in April 2022. Most recent CBG of 142. -NovoLog 20 units 3 times daily with meals -SSI, moderate -CBG 4x daily   #CKD stage IIIb Baseline creatinine ~2. Creatinine today of 2.56, GFR 27. -Trend BMP -Avoid nephrotoxic meds, including ACE/ARB   #H/o HCC s/p liver transplant Patient states that he underwent liver transplant 9 years ago. He has not had any issues since. He is on mycophenolate at home. -Continue mycophenolate 1000 mg p.o. twice daily   #HTN BP of 121/54, has been stable since arrival. -Continue hydralazine 25 mg p.o. every morning and 12.5 mg p.o. at bedtime.   #HLD -Continue gemfibrozil 300 mg p.o. twice daily   #Gout -Continue allopurinol 150 mg p.o. daily  #Anemia Hgb noted to be around 14 last year. On admission, patient with Hgb of 10, most recently 9.7.  No obvious signs of bleeding. Iron studies WNL except for decreased sat ratio of 13%. Ferritin 78. -Will give a 25 mg test dose of iron dextran -Trend CBC   Prior to Admission Living  Arrangement: Home Anticipated Discharge Location: Home Barriers to Discharge: Management of heart failure exacerbation Dispo: Anticipated discharge pending medical management of heart failure exacerbation  [name], MD 11/23/2020, 10:54 AM Pager: [phone]  After 5pm on weekdays and 1pm on weekends: On Call pager [phone]

## 2020-11-24 ENCOUNTER — Inpatient Hospital Stay (HOSPITAL_COMMUNITY): Payer: Medicare Other

## 2020-11-24 DIAGNOSIS — I5033 Acute on chronic diastolic (congestive) heart failure: Secondary | ICD-10-CM | POA: Diagnosis not present

## 2020-11-24 DIAGNOSIS — I509 Heart failure, unspecified: Secondary | ICD-10-CM | POA: Diagnosis not present

## 2020-11-24 LAB — GLUCOSE, CAPILLARY
Glucose-Capillary: 121 mg/dL — ABNORMAL HIGH (ref 70–99)
Glucose-Capillary: 150 mg/dL — ABNORMAL HIGH (ref 70–99)
Glucose-Capillary: 59 mg/dL — ABNORMAL LOW (ref 70–99)
Glucose-Capillary: 107 mg/dL — ABNORMAL HIGH (ref 70–99)
Glucose-Capillary: 115 mg/dL — ABNORMAL HIGH (ref 70–99)

## 2020-11-24 LAB — CBC
HCT: 31.4 % — ABNORMAL LOW (ref 39.0–52.0)
Hemoglobin: 9.9 g/dL — ABNORMAL LOW (ref 13.0–17.0)
MCH: 29.4 pg (ref 26.0–34.0)
MCHC: 31.5 g/dL (ref 30.0–36.0)
MCV: 93.2 fL (ref 80.0–100.0)
Platelets: 122 10*3/uL — ABNORMAL LOW (ref 150–400)
RBC: 3.37 MIL/uL — ABNORMAL LOW (ref 4.22–5.81)
RDW: 16.8 % — ABNORMAL HIGH (ref 11.5–15.5)
WBC: 6 10*3/uL (ref 4.0–10.5)
nRBC: 0 % (ref 0.0–0.2)

## 2020-11-24 LAB — MAGNESIUM: Magnesium: 2.5 mg/dL — ABNORMAL HIGH (ref 1.7–2.4)

## 2020-11-24 LAB — BASIC METABOLIC PANEL
Anion gap: 13 (ref 5–15)
BUN: 102 mg/dL — ABNORMAL HIGH (ref 8–23)
CO2: 27 mmol/L (ref 22–32)
Calcium: 9.6 mg/dL (ref 8.9–10.3)
Chloride: 93 mmol/L — ABNORMAL LOW (ref 98–111)
Creatinine, Ser: 2.42 mg/dL — ABNORMAL HIGH (ref 0.61–1.24)
GFR, Estimated: 29 mL/min — ABNORMAL LOW (ref >60)
Glucose, Bld: 119 mg/dL — ABNORMAL HIGH (ref 70–99)
Potassium: 4.1 mmol/L (ref 3.5–5.1)
Sodium: 133 mmol/L — ABNORMAL LOW (ref 135–145)

## 2020-11-24 MED ORDER — METOLAZONE 5 MG PO TABS
5.0000 mg | ORAL_TABLET | Freq: Once | ORAL | Status: AC
  Administered 2020-11-24: 5 mg via ORAL
  Filled 2020-11-24: qty 1

## 2020-11-24 MED ORDER — INSULIN ASPART 100 UNIT/ML IJ SOLN
10.0000 [IU] | Freq: Three times a day (TID) | INTRAMUSCULAR | Status: DC
  Administered 2020-11-24 – 2020-12-03 (×21): 10 [IU] via SUBCUTANEOUS

## 2020-11-24 MED ORDER — SODIUM CHLORIDE 0.9 % IV SOLN
1000.0000 mg | Freq: Once | INTRAVENOUS | Status: AC
  Administered 2020-11-24: 1000 mg via INTRAVENOUS
  Filled 2020-11-24: qty 20

## 2020-11-24 MED ORDER — POTASSIUM CHLORIDE CRYS ER 20 MEQ PO TBCR
40.0000 meq | EXTENDED_RELEASE_TABLET | Freq: Two times a day (BID) | ORAL | Status: DC
  Administered 2020-11-24 – 2020-12-02 (×15): 40 meq via ORAL
  Filled 2020-11-24 (×7): qty 2
  Filled 2020-11-24: qty 4
  Filled 2020-11-24: qty 2
  Filled 2020-11-24: qty 4
  Filled 2020-11-24 (×5): qty 2

## 2020-11-24 NOTE — Hospital Course (Addendum)
Gregory Tanner is a 65 year old male with a past medical history of HFpEF, CKD Stage IIIB, HCC s/p liver transplant, Type II Diabetes Mellitus, BPH, and HTN who presented with fluid retention and was admitted for HFpEF exacerbation.  #HFpEF exacerbation Patient has a history of heart failure, for which he is followed by Dr. Sharol Roussel at [facility]. He endorsed having CHF exacerbations in the past.  He presented to the ED with a 1.5-week history of increased abdominal girth and worsening swelling in the bilateral lower extremities. He has been compliant with his home regimen of Bumex 1.5 mg twice daily, metolazone 5 mg on M/W/F/Sat, and spironolactone 25 mg twice daily. No dietary indiscretion, no recent sick contacts. In the ED, patient was afebrile and HDS. Exam remarkable for JVD, rhonchi on pulmonary exam, distended abdomen, and 2+ bilateral pitting edema. WBC 4.8, Cr 2.25 (up from baseline around 2), BNP 352.  No recent progression of CKD. Chest x-ray revealed cardiomegaly with pulmonary venous congestion and interstitial markings. He was given 40mg  Lasix in the ED. Also given one dose of metolazone 5mg . Echo performed 10/10 shows EF 65-75%. He now has grade II diastolic dysfunction, which is new from echo in May 2021. Weight on admit was 92.1 kg, and weight today is 92.4 kg. Patient said he has had continued output, and net output recorded greater than 4L. Continued diuresis with IV lasix 80mg  twice daily and continued on spironolactone 25mg  twice daily. Given his significant lower extremity edema, we obtained lower extremity vascular ultrasound on 10/10 to evaluate for DVT. It showed no evidence of DVT and no evidence of cystic structures in the popliteal fossa. Gave another dose of metolazone on 10/11. ABIs were completed as well for placement of right unna boot, and results showed he has no evidence of significant right lower extremity arterial disease but moderate left lower extremity arterial disease with  abnormal left toe-brachial index. Increased coughing and crackles still noted in right lung on physical exam on 10/12.  Patient was subsequently placed on acetazolamide 125 mg p.o. twice daily.  The following day on 10/13, the patient's weight decreased from 92.4kg to 91.9 kg, and net output was recorded at greater than 4 L. Exam that day showed +3 left lower extremity edema, Unna boot on the right side.  However, crackles seemed improved compared to exam prior.  The following day on 10/14, patient had 2.9 L output and weight decreased to 91.3 kg. Clinically, the patient's crackles seem to have improved and right lower leg edema seems to have improved with Unna boot, however LLE still 2+ pitting edema.  During the weekend of 10/15-10/16, patient continued to diurese well, and was down to 88.9 kg by the end of the weekend on same diuretic regimen.  Chest x-ray over the weekend showed Cardiomegaly with pulmonary vascular prominence and mild, diffuse interstitial pulmonary opacity, likely edema.  BNP also improved from 352-222.  On 10/16, IR consult was placed for paracentesis given concern that his ascites and edema could have been related to underlying liver dysfunction.  On 10/17, patient underwent paracentesis by IR, yielded 1.9 L fluid.  SAAG 1.2.  Patient continued to diurese well through the week, and on 10/19 patient was transitioned to his home diuresis regimen.  Exam that day showed 1+ edema of RLE and Unna boot, 2+ of LLE, however no crackles appreciated. Repeat chest x-ray was performed on 10/20 and showed unchanged pulmonary vascular congestion.  He was discharged home on his home oral diuretic regimen.  #  Anemia Hgb noted to be around 14 last year. On admission, patient with Hgb of 10, most recently 9.9 this morning.  No obvious signs of bleeding. Iron studies WNL except for decreased sat ratio of 13%. Ferritin 78. Gave 25mg  test dose of iron dextran, followed by 1000mg  dose. Patient tolerated well, and  hemoglobin remained stable around 9.9-10.   #History of hypokalemia Patient reports history of low potassium, and he has had difficulty keeping his potassium level above goal of 4. On admission, potassium chloride ordered as 67mEq twice daily, changed to home dose of 40 mEQ twice daily on 10/11. BMP was monitored throughout hospitalization and extra potassium was given as needed.  On 10/19, patient had a potassium of 4.6, which was noted to be slowly uptrending on his home regimen, therefore his regimen was changed to 40 mEQ once daily.  The following day, potassium was noted to be at 3.9.  He was discharged on his home regimen of 40 mEQ twice daily.  #Atrial tachycardia Patient has no history of arrhythmia but had several short bursts of atrial tachycardia in the 140s as he slept in his recliner on 10/12. Note from overnight says bursts were 1 second long every 30 minutes. The changes were noted on telemetry but not long enough to be noted on EKG. He endorsed having a fast heart rate at times last night but he remained hemodynamically stable and experienced no acute distress. He remained on telemetry and did not have any other events.   #Right Great Toe Wound Patient had nail loss of the right great toe with excoriation over the nail area because he stumped his toe and tore his nail off. Wound care saw the patient and noted there was scant, serous drainage on the dressing. WOC evaluated the patient with recommendation for daily dressing changes. These recommendations were followed, and patient did not develop any signs or symptoms of infection throughout his hospitalization.  #T2DM Patient is on 20-30 units of Humalog with meals, no basal dose. Most recent A1c of 6.0 in April 2022.  He was transitioned to NovoLog 10 units 3 times daily with meals and SSI moderate and blood sugars were monitored regularly throughout his hospitalization.  Patient had an episode of hypoglycemia on 10/12 with CBG measuring  59. Prior to this, he was given 20units of Novolog. Dosing was changed, and hypoglycemia corrected after patient was given 8oz juice. Blood sugars were monitored and otherwise stable throughout his hospitalization.  He was discharged home on his home regimen.   #CKD stage IIIb Baseline creatinine ~2.  BMPs were followed regularly throughout his hospitalization.  Nephrotoxic meds were avoided throughout his hospitalization.  Creatinine remained stable in the 2.2-2.4 range until 10/18, at which time creatinine slowly increased, to 2.59 on 10/20.   #H/o [facility] s/p liver transplant Patient states that he underwent liver transplant 9 years ago. He has not had any issues since. He takes mycophenolate 1000 mg p.o. twice daily at home, therefore this was continued daily during his hospitalization.  As above, paracentesis was performed to see if his ascites could have been due to underlying liver dysfunction. SAAG of 1.2 concerning for portal hypertension. RUQ ultrasound did not show ascites. His liver enzymes have also remained stable.  PT/INR WNL on admission.  His ascites and edema seem to be more likely related to his heart failure rather than underlying liver dysfunction.   #HTN BP has been stable since arrival with only a few readings indicative of hypotension. Patient  continued on hydralazine 25mg  PO every morning and 12.5mg  PO at bedtime during hospitalization.   #HLD Patient continued on gemfibrozil 300mg  PO twice daily during hospitalization.   #Gout Patient continued on allopurinol 150mg  PO daily during hospitalization.

## 2020-11-24 NOTE — Progress Notes (Signed)
  Mobility Specialist Criteria Algorithm Info.  Mobility Team: HOB elevated: Activity: Ambulated in hall; Dangled on edge of bed Range of motion: Active; All extremities Level of assistance: Independent Assistive device: Other (Comment) (IV Pole) Minutes sitting in chair:  Minutes stood:  Minutes ambulated: 3 minutes Distance ambulated (ft): 110 ft Mobility response: Tolerated well Bed Position: Semi-fowlers  Patient was received at doorway willing to participate in mobility. Ambulated in hallway with steady gait using IV pole for stability. Tolerated ambulation well without complaint or incident and was left dangling EOB with all needs met. 11/24/2020 3:37 PM

## 2020-11-24 NOTE — Progress Notes (Signed)
Hypoglycemic Event  CBG: 59  Treatment: 8 oz juice/soda  Symptoms: None  Follow-up CBG: Time:1229 CBG Result:150  Possible Reasons for Event: Unknown  Comments/MD notified:yes    Gregory Tanner

## 2020-11-24 NOTE — Progress Notes (Addendum)
Gregory Tanner is a 65 year old male with a past medical history of HFpEF, CKD Stage IIIB, HCC s/p liver transplant, Type II Diabetes Mellitus, BPH, and HTN who presented with fluid retention and was admitted for HFpEF exacerbation.  Subjective:  Patient had no acute overnight events.  Patient was doing well this morning. He slept in the chair with his feet elevated. He said his leg swelling has felt "rock hard" on previous days but his legs felt better today. He also mentioned a persistent sensation of stomach fullness and believes his abdomen has become very distended.   Objective:  Vital signs in last 24 hours: Vitals:   11/23/20 2109 11/24/20 0022 11/24/20 0345 11/24/20 0843  BP: (!) 123/54 (!) 123/59 126/64 (!) 134/55  Pulse: 98 93 93 93  Resp: 18 (!) 23 (!) 23 20  Temp: 98.3 F (36.8 C) 98 F (36.7 C) 98.1 F (36.7 C) 98.4 F (36.9 C)  TempSrc: Oral Oral Oral Oral  SpO2: 96% 95% 97% 96%  Weight:   91.7 kg   Height:       Weight change:   Intake/Output Summary (Last 24 hours) at 11/24/2020 1105 Last data filed at 11/24/2020 0856 Gross per 24 hour  Intake 1078 ml  Output 2825 ml  Net -1747 ml   Physical Exam Constitutional:  Normal appearance, sitting in his chair very alert.  HENT: Normocephalic and atraumatic.  Cardiovascular: Regular rhythm.  Pulmonary: Pulmonary effort is normal. No respiratory distress. Crackles in right lung, most noticeable at the base. Abdominal: Bowel distension. Musculoskeletal: 2+ bilateral pitting edema, R>L. Dark red/purple shin skin discoloration R>L. Right leg is warm to touch, left leg is cool to touch.  Skin: Warm and dry.  Neurological: No focal deficit present. Alert and oriented to person, place, and time.  Psychiatric: Mood and behavior normal.   Assessment/Plan:  Principal Problem:   Acute exacerbation of CHF (congestive heart failure) (HCC) Active Problems:   Type 2 diabetes mellitus ([place])   History of liver transplant  ([place])   Thrombocytopenia (HCC)   Iron deficiency anemia  Gregory Tanner is a 65 year old male with a past medical history of HFpEF, CKD Stage IIIB, HCC s/p liver transplant, Type II Diabetes Mellitus, BPH, and HTN who presented with fluid retention and was admitted for HFpEF exacerbation.  #HFpEF exacerbation Patient has a history of heart failure, for which he is followed by Dr. Sharol Roussel at [facility]. Most recent echo performed yesterday shows EF 65-75%. He now has grade II diastolic dysfunction, which is new from echo in May 2021. Weight on admit was 92.1 kg, and weight today is 91.7 kg. Patient said he has had continued output, and net output recorded greater than 3L. Will continue diuresis as below. Given his significant lower extremity edema, we obtained lower extremity vascular ultrasound yesterday to evaluate for DVT. It showed no evidence of DVT and no evidence of cystic structures in the popliteal fossa. ABIs were completed today for placement of unna boots, and he has no evidence of significant right lower extremity arterial disease but moderate left lower  extremity arterial disease with abnormal left toe-brachial index. - IV Lasix 80mg  twice daily, s/p 1x metolazone 5mg  - Give second dose of Metolazone 5mg  today - Continue spironolactone 25mg  twice daily - Strict I/O's and daily weights - Unna Boots for right leg - Cardiac monitoring  #Anemia Hgb noted to be around 14 last year. On admission, patient with Hgb of 10, most recently 9.9 this morning.  No obvious  signs of bleeding. Iron studies WNL except for decreased sat ratio of 13%. Ferritin 78. Gave 25mg  test dose of iron dextran. 1000mg  dose that was supposed to follow was not given yesterday. Reasoning listed was medication not available. Will follow up for clarification. - Give 1000mg  iron dextran in sodium chloride 0.9% 571mL IVPB today if possible - Trend CBC  #History of hypokalemia Patient reports history of low potassium, and he  has had difficulty keeping his potassium level above goal of 4. On admission, potassium chloride ordered as 14mEq twice daily, but last night, patient reported he takes 76mEq twice daily. Potassium 4.1 today. -  Resume home potassium chloride 29mEq PO twice daily  #T2DM Patient is on 20-30 units of Humalog with meals, no basal dose. Most recent A1c of 6.0 in April 2022. Most recent CBG of 59 this morning in the setting of patient's decreased PO intake while here in the hospital. - NovoLog 10 units 3 times daily with meals - SSI, moderate - CBG 4x daily   #CKD stage IIIb Baseline creatinine ~2. Creatinine today of 2.42, GFR 29. - Trend BMP - Avoid nephrotoxic meds, including ACE/ARB   #H/o HCC s/p liver transplant Patient states that he underwent liver transplant 9 years ago. He has not had any issues since. He takes mycophenolate at home. - Continue mycophenolate 1000mg  PO twice daily   #HTN BP of 120/55, has been stable since arrival with only a few readings indicative of hypotension. - Continue hydralazine 25mg  PO every morning and 12.5mg  PO at bedtime.  #HLD - Continue gemfibrozil 300mg  PO twice daily  #Gout - Continue allopurinol 150mg  PO daily    Best Practice Diet: Heart Healthy IVF: None VTE: Lovenox 40mg  injection daily Code: Full  Prior to Admission Living Arrangement: Home Anticipated Discharge Location: Home Barriers to Discharge: Management of heart failure exacerbation Dispo: Anticipated discharge pending medical management of heart failure exacerbation   LOS: 2 days   Danie Chandler, Medical Student 11/24/2020, 11:05 AM   Attestation for Student Documentation:  I personally was present and performed or re-performed the history, physical exam and medical decision-making activities of this service and have verified that the service and findings are accurately documented in the student's note.  [name], MD 11/24/2020, 1:34 PM

## 2020-11-24 NOTE — Progress Notes (Signed)
ABI has been completed.   Preliminary results in CV Proc.   Jinny Blossom Rael Yo 11/24/2020 11:24 AM

## 2020-11-25 DIAGNOSIS — D509 Iron deficiency anemia, unspecified: Secondary | ICD-10-CM

## 2020-11-25 DIAGNOSIS — D696 Thrombocytopenia, unspecified: Secondary | ICD-10-CM | POA: Diagnosis not present

## 2020-11-25 DIAGNOSIS — I5033 Acute on chronic diastolic (congestive) heart failure: Secondary | ICD-10-CM | POA: Diagnosis not present

## 2020-11-25 LAB — CBC
HCT: 31.4 % — ABNORMAL LOW (ref 39.0–52.0)
Hemoglobin: 9.9 g/dL — ABNORMAL LOW (ref 13.0–17.0)
MCH: 29.3 pg (ref 26.0–34.0)
MCHC: 31.5 g/dL (ref 30.0–36.0)
MCV: 92.9 fL (ref 80.0–100.0)
Platelets: 123 10*3/uL — ABNORMAL LOW (ref 150–400)
RBC: 3.38 MIL/uL — ABNORMAL LOW (ref 4.22–5.81)
RDW: 16.9 % — ABNORMAL HIGH (ref 11.5–15.5)
WBC: 5.4 10*3/uL (ref 4.0–10.5)
nRBC: 0 % (ref 0.0–0.2)

## 2020-11-25 LAB — BASIC METABOLIC PANEL
Anion gap: 11 (ref 5–15)
BUN: 100 mg/dL — ABNORMAL HIGH (ref 8–23)
CO2: 28 mmol/L (ref 22–32)
Calcium: 9.5 mg/dL (ref 8.9–10.3)
Chloride: 90 mmol/L — ABNORMAL LOW (ref 98–111)
Creatinine, Ser: 2.38 mg/dL — ABNORMAL HIGH (ref 0.61–1.24)
GFR, Estimated: 29 mL/min — ABNORMAL LOW (ref >60)
Glucose, Bld: 144 mg/dL — ABNORMAL HIGH (ref 70–99)
Potassium: 4.1 mmol/L (ref 3.5–5.1)
Sodium: 129 mmol/L — ABNORMAL LOW (ref 135–145)

## 2020-11-25 LAB — GLUCOSE, CAPILLARY
Glucose-Capillary: 133 mg/dL — ABNORMAL HIGH (ref 70–99)
Glucose-Capillary: 118 mg/dL — ABNORMAL HIGH (ref 70–99)
Glucose-Capillary: 104 mg/dL — ABNORMAL HIGH (ref 70–99)
Glucose-Capillary: 145 mg/dL — ABNORMAL HIGH (ref 70–99)

## 2020-11-25 MED ORDER — METOLAZONE 5 MG PO TABS
5.0000 mg | ORAL_TABLET | Freq: Once | ORAL | Status: AC
  Administered 2020-11-25: 5 mg via ORAL
  Filled 2020-11-25: qty 1

## 2020-11-25 MED ORDER — ACETAZOLAMIDE 250 MG PO TABS
125.0000 mg | ORAL_TABLET | Freq: Two times a day (BID) | ORAL | Status: DC
  Administered 2020-11-25 – 2020-12-02 (×15): 125 mg via ORAL
  Filled 2020-11-25 (×15): qty 1

## 2020-11-25 MED ORDER — DIPHENHYDRAMINE HCL 25 MG PO CAPS
25.0000 mg | ORAL_CAPSULE | Freq: Once | ORAL | Status: AC
  Administered 2020-11-25: 25 mg via ORAL
  Filled 2020-11-25: qty 1

## 2020-11-25 NOTE — Consult Note (Signed)
[facility] Nurse Consult Note: Patient receiving care in [place] Reason for Consult: R great toe wound Wound type: Right great toe nail loss with excoriation over the toe nail area. States he stumped his toe and tore the nail off but didn't realize it until later because he didn't feel it.  Pressure Injury POA: NA Wound bed: Ruddy red. Drainage (amount, consistency, odor) scant serous on dressing.  Periwound: intact Dressing procedure/placement/frequency: Clean the toe with NS, pat dry then place a small piece of Xeroform gauze over the wound and wrap with small Kerlix. Change daily.  Monitor the wound area(s) for worsening of condition such as: Signs/symptoms of infection, increase in size, development of or worsening of odor, development of pain, or increased pain at the affected locations.   Notify the medical team if any of these develop.  Thank you for the consult. [place] nurse will not follow at this time.   Please re-consult the [place] team if needed.  Cathlean Marseilles Tamala Julian, MSN, RN, [place], Lysle Pearl, [facility] Wound Treatment Associate Pager [phone]

## 2020-11-25 NOTE — Progress Notes (Addendum)
Gregory Tanner is a 65 year old male with a past medical history of HFpEF, CKD Stage IIIB, HCC s/p liver transplant, Type II Diabetes Mellitus, BPH, and HTN who presented with fluid retention and was admitted for HFpEF exacerbation.  Subjective:  Patient had acute overnight events. He has no history of arrhythmia but had several short bursts of atrial tachycardia in the 140s as he slept in his recliner. Note from overnight says bursts were 1 second long every 30 minutes. The changes were noted on telemetry but not long enough to be noted on EKG. He endorsed having a fast heart rate at times last night but he remained hemodynamically stable and experienced no acute distress. He also says that he had intense itching overnight. The distribution of pruritis included his head, neck, back, and bilateral upper extremities. He has had a similar sensation in the past after receiving contrast for imaging, and he is unsure what precipitated this, but his symptoms were relived by benadryl.  Patient is doing well this morning. He slept sitting upright in his chair, but he said he did not sleep well because his sleep was interrupted several times. He denied trouble breathing, but he said he feels very short of breath at times and has a cough that has been more persistent this morning. He says the coughing is episodic, and he coughs for a while and then stops coughing for a while. He said his right leg still feels "rock hard" on the posterior side. He is urinating frequently. He is still having abdominal distension. He also mentioned he is brought many fluids with his meals. He restricts himself to 6 drinks that are 16oz each when he is at home.    Objective:  Vital signs in last 24 hours: Vitals:   11/24/20 2345 11/25/20 0109 11/25/20 0405 11/25/20 0756  BP: 123/60 120/62 122/65 137/68  Pulse: 90 91 89 89  Resp: (!) 21 20 18    Temp: 98.5 F (36.9 C) 98 F (36.7 C) 97.8 F (36.6 C) 97.7 F (36.5 C)   TempSrc: Oral Oral Oral Oral  SpO2: 96% 96% 97% 97%  Weight:   92.4 kg   Height:       Weight change: 0.045 kg  Intake/Output Summary (Last 24 hours) at 11/25/2020 1038 Last data filed at 11/25/2020 0902 Gross per 24 hour  Intake 988.58 ml  Output 2525 ml  Net -1536.42 ml   Physical Exam Constitutional:  Normal appearance, sitting in his chair very alert.  HENT: Normocephalic and atraumatic.  Cardiovascular: Regular rate and rhythm.  Pulmonary: Pulmonary effort is normal. No respiratory distress. Fine crackles in right lung, most noticeable at the base. Abdominal: Bowel distension (unchanged from yesterday). Normal bowel sounds. Musculoskeletal: Lower extremity swelling. No pain or tenderness with palpation. Unna boot on right leg.  Skin: Warm and dry. No obvious excoriations from scratching overnight. Dark red/purple left shin skin discoloration. Unna boot on right leg. Neurological: No focal deficit present. Alert and oriented to person, place, and time.  Psychiatric: Mood and behavior normal.   Assessment/Plan:  Principal Problem:   Acute exacerbation of CHF (congestive heart failure) (HCC) Active Problems:   Type 2 diabetes mellitus ([place])   History of liver transplant ([place])   Thrombocytopenia (HCC)   Iron deficiency anemia   Gregory Tanner is a 65 year old male with a past medical history of HFpEF, CKD Stage IIIB, HCC s/p liver transplant, Type II Diabetes Mellitus, BPH, and HTN who presented with fluid retention and was admitted  for HFpEF exacerbation.   #HFpEF exacerbation Patient has a history of heart failure, for which he is followed by [facility]. Sharol R[facility]sel at [facility]. Most recent echo performed 10/10 shows EF 65-75%. He now has grade II diastolic dysfunction, which is new from echo in May 2021. Weight on admit was 92.1 kg, and weight today is 92.4 kg. Patient said he has had continued output, and net output recorded greater than 4L. Weight gain could be secondary to iron  dextran infusion in a fluid bolus or PO intake because the patient says he has been eating well. He has been diuresing well, so will continue diuresis as below. He had no evidence of DVT and no evidence of cystic structures in the popliteal fossa on ultrasound. ABIs demonstrated moderate left lower extremity arterial disease with abnormal left toe-brachial index. Increased coughing and crackles still noted in right lung on physical exam, so will continue to monitor. - IV Lasix 80mg  twice daily - Give third dose of metolazone 5mg  today - Start acetazolamide 125 mg po BID - Continue spironolactone 25mg  twice daily - Strict I/O's and daily weights - 1893mL daily fluid restriction - Continue [employer] for right leg - Cardiac monitoring - Will need vascular follow-up outpatient to discuss ABI findings   #Anemia Hgb noted to be around 14 last year. On admission, patient with Hgb of 10.  No obvious signs of bleeding. Iron studies WNL except for decreased sat ratio of 13%. Ferritin 78. Gave 25mg  test dose of iron dextran, followed by 1000mg  dose. Patient tolerated well, and hemoglobin is 9.9 this morning. - Trend CBC   #History of hypokalemia Patient reports history of low potassium, and he has had difficulty keeping his potassium level above goal of 4. On admission, potassium chloride ordered as 66mEq twice daily, but last night, patient reported he takes 24mEq twice daily. Potassium 4.1 today. -  Continue potassium chloride 60mEq PO twice daily  #Atrial tachycardia Patient has no history of arrhythmia but did have severe dilatation of the left atrial on echo performed on 10/10. Last night, the patient had several short bursts of atrial tachycardia in the 140s as he slept in his recliner last night. Note from overnight says bursts were 1 second long every 30 minutes. The changes were noted on telemetry but not long enough to be noted on EKG. He endorsed having a fast heart rate at times last night but  he remained hemodynamically stable and experienced no acute distress. Will continue to monitor. - Cardiac monitoring  #Right Great Toe Wound Patient had nail loss of the right great toe with excoriation over the nail area because he stumped his toe and tore his nail off. There was scant, serous drainage on the dressing. - Wound Care saw the patient, appreciate assistance      - Daily dressing changes      - Monitor for signs/symptoms of infection   #T2DM Patient is on 20-30 units of Humalog with meals, no basal dose. Most recent A1c of 6.0 in April 2022. Patient had an episode of hypoglycemia yesterday morning with CBG measuring 59. Prior to this, he was given 20units of Novolog. Dosing was changed to 10 units daily, and his hypoglycemia corrected after patient was given 8oz juice. Blood sugar has remained stable since with most recent reading 144. - NovoLog 10 units 3 times daily with meals - SSI, moderate - CBG 4x daily   #CKD stage IIIb Baseline creatinine ~2. Creatinine today of 2.38, GFR 29. -  Trend BMP - Avoid nephrotoxic meds, including ACE/ARB   #H/o HCC s/p liver transplant Patient states that he underwent liver transplant 9 years ago. He has not had any issues since. He takes mycophenolate at home. - Continue mycophenolate 1000mg  PO twice daily   #HTN BP of 137/68 today, has been stable since arrival with only a few readings indicative of hypotension. - Continue hydralazine 25mg  PO every morning and 12.5mg  PO at bedtime.   #HLD - Continue gemfibrozil 300mg  PO twice daily   #Gout - Continue allopurinol 150mg  PO daily     Best Practice Diet: Heart Healthy, Fluid restriction 1866mL IVF: None VTE: Lovenox 40mg  injection daily Code: Full   Prior to Admission Living Arrangement: Home Anticipated Discharge Location: Home Barriers to Discharge: Management of heart failure exacerbation Dispo: Anticipated discharge pending medical management of heart failure exacerbation      LOS: 3 days   Danie Chandler, Medical Student 11/25/2020, 10:38 AM

## 2020-11-25 NOTE — Progress Notes (Signed)
Paged by RN for multiple short bursts of atrial tachycardia to the 140s.  Bursts lasting approximately 1 second every 30 minutes.  Noted on telemetry, not long enough to capture on EKG. Patient evaluated at bedside.  He is awake alert and oriented, does not appear to be in any acute distress.  He does not report any dizziness lightheadedness chest pain shortness of breath.  He does endorse feeling some rapid heart rate earlier but nothing currently.  His only complaint at this time is feeling itchy all over.  Physical exam alert and oriented no acute distress. Heart regular rate and rhythm Lower extremity edema as noted on day team exam. Breathing comfortably on room air with normal respiratory effort.  Most recent electrolytes 4.1 potassium magnesium 2.5.  We will continue to monitor as patient is hemodynamically stable and asymptomatic at this time. Will give 25 mg Benadryl for itching.

## 2020-11-25 NOTE — Progress Notes (Signed)
Pt has had multiple short bursts of atrial tachycardia in the 140s. Pt sleeping in the recliner. No history of arrhythmias. MD paged.

## 2020-11-26 DIAGNOSIS — D509 Iron deficiency anemia, unspecified: Secondary | ICD-10-CM | POA: Diagnosis not present

## 2020-11-26 DIAGNOSIS — I5033 Acute on chronic diastolic (congestive) heart failure: Secondary | ICD-10-CM | POA: Diagnosis not present

## 2020-11-26 LAB — BASIC METABOLIC PANEL
Anion gap: 15 (ref 5–15)
BUN: 102 mg/dL — ABNORMAL HIGH (ref 8–23)
CO2: 24 mmol/L (ref 22–32)
Calcium: 9.5 mg/dL (ref 8.9–10.3)
Chloride: 91 mmol/L — ABNORMAL LOW (ref 98–111)
Creatinine, Ser: 2.38 mg/dL — ABNORMAL HIGH (ref 0.61–1.24)
GFR, Estimated: 29 mL/min — ABNORMAL LOW (ref >60)
Glucose, Bld: 107 mg/dL — ABNORMAL HIGH (ref 70–99)
Potassium: 3.8 mmol/L (ref 3.5–5.1)
Sodium: 130 mmol/L — ABNORMAL LOW (ref 135–145)

## 2020-11-26 LAB — CBC
HCT: 29.8 % — ABNORMAL LOW (ref 39.0–52.0)
Hemoglobin: 9.9 g/dL — ABNORMAL LOW (ref 13.0–17.0)
MCH: 30.3 pg (ref 26.0–34.0)
MCHC: 33.2 g/dL (ref 30.0–36.0)
MCV: 91.1 fL (ref 80.0–100.0)
Platelets: 132 10*3/uL — ABNORMAL LOW (ref 150–400)
RBC: 3.27 MIL/uL — ABNORMAL LOW (ref 4.22–5.81)
RDW: 16.7 % — ABNORMAL HIGH (ref 11.5–15.5)
WBC: 5.2 10*3/uL (ref 4.0–10.5)
nRBC: 0 % (ref 0.0–0.2)

## 2020-11-26 LAB — GLUCOSE, CAPILLARY
Glucose-Capillary: 130 mg/dL — ABNORMAL HIGH (ref 70–99)
Glucose-Capillary: 106 mg/dL — ABNORMAL HIGH (ref 70–99)
Glucose-Capillary: 60 mg/dL — ABNORMAL LOW (ref 70–99)
Glucose-Capillary: 104 mg/dL — ABNORMAL HIGH (ref 70–99)
Glucose-Capillary: 138 mg/dL — ABNORMAL HIGH (ref 70–99)

## 2020-11-26 MED ORDER — POTASSIUM CHLORIDE 20 MEQ PO PACK
20.0000 meq | PACK | Freq: Once | ORAL | Status: AC
  Administered 2020-11-26: 20 meq via ORAL
  Filled 2020-11-26: qty 1

## 2020-11-26 MED ORDER — METOLAZONE 5 MG PO TABS
5.0000 mg | ORAL_TABLET | Freq: Once | ORAL | Status: AC
  Administered 2020-11-26: 5 mg via ORAL
  Filled 2020-11-26: qty 1

## 2020-11-26 NOTE — Care Management Important Message (Signed)
Important Message  Patient Details  Name: Gregory Tanner MRN: [id number] Date of Birth: [birth year]   Medicare Important Message Given:  Yes     [name] 11/26/2020, 9:16 AM

## 2020-11-26 NOTE — Progress Notes (Signed)
Hypoglycemic Event  CBG: 60  Treatment: 4 oz juice/soda in place of patient's tea  Symptoms: Shaky  Follow-up CBG: Time:1208 CBG Result:106  Possible Reasons for Event: Other: managing patient's diabetes differently than he manages at home  Comments/MD notified: MD paged and returned call. Discussed plan of care for patient.     Manuella Ghazi

## 2020-11-26 NOTE — Consult Note (Signed)
   [facility] Inpatient Consult   11/26/2020  [name] Jul 10, 1955 [id number]  [place] [facility] [ACO] Patient: Gregory Tanner [facility] Medicare  Primary Care Provider:  Ronita Hipps, MD [facility]  Patient screened for hospitalization with noted high risk score for unplanned readmission risk and to assess for potential [place] [facility] Management service needs for post hospital transition.    Plan:  Continue to follow progress and disposition to assess for post hospital care management needs.    For questions contact:   Natividad Brood, RN BSN [facility] Liaison  [phone] business mobile phone Toll free office [phone]  Fax number: [phone] [email or web address]

## 2020-11-26 NOTE — Progress Notes (Signed)
Gregory Tanner is a 65 year old male with a past medical history of HFpEF, CKD Stage IIIB, HCC s/p liver transplant, Type II Diabetes Mellitus, BPH, and HTN who presented with fluid retention and was admitted for HFpEF exacerbation.  Subjective:  Patient had acute overnight events.  Patient was seen at bedside this morning with his wife present.  He endorses that he has had some irritation of the nares.  Pacifically, he will occasionally see some blood when he blows his nose.  Additionally, he states that the potassium packet that he received this morning does not taste good and would rather receive his potassium as a pill.  No other complaints or concerns today.  Objective:  Vital signs in last 24 hours: Vitals:   11/25/20 2018 11/26/20 0451 11/26/20 0817 11/26/20 1045  BP: (!) 127/55 119/60 (!) 123/55 (!) 117/55  Pulse: 91 91 91 87  Resp: 18 18    Temp: 98.3 F (36.8 C) 98.2 F (36.8 C)    TempSrc: Oral Oral    SpO2: 97% 97%    Weight:  91.9 kg    Height:       Weight change: -0.499 kg  Intake/Output Summary (Last 24 hours) at 11/26/2020 1102 Last data filed at 11/26/2020 0900 Gross per 24 hour  Intake 957 ml  Output 4325 ml  Net -3368 ml    Physical Exam Constitutional: Normal appearance, sitting in his chair very alert.  HENT: Normocephalic and atraumatic.  Cardiovascular: Regular rate and rhythm.  Pulmonary: Pulmonary effort is normal. No respiratory distress. Fine crackles in right lung, most noticeable at the base, improved compared to exam yesterday. Abdominal: Bowel distension (unchanged from yesterday). Normal bowel sounds.  No tenderness to palpation. Musculoskeletal: Lower extremity swelling, 3+ LLE pitting edema. Unna boot intact to right side. No pain or tenderness with palpation.  Skin: Warm and dry. Dark red/purple left shin skin discoloration. Unna boot on right leg. Neurological: No focal deficit present. Alert and oriented to person, place, and time.   Psychiatric: Mood and behavior normal.   Assessment/Plan:  Principal Problem:   Acute exacerbation of CHF (congestive heart failure) (HCC) Active Problems:   Type 2 diabetes mellitus ([place])   History of liver transplant ([place])   Thrombocytopenia (HCC)   Iron deficiency anemia   Gregory Tanner is a 65 year old male with a past medical history of HFpEF, CKD Stage IIIB, HCC s/p liver transplant, Type II Diabetes Mellitus, BPH, and HTN who presented with fluid retention and was admitted for HFpEF exacerbation.   #HFpEF exacerbation Weight on admit was 92.1 kg, and weight today is 91.9 kg. Patient said he has had improved urine output, recorded at 4.1 L since yesterday. He has been diuresing well since adding acetazolamide to his regimen, so will continue diuresis as below. ABIs demonstrated moderate left lower extremity arterial disease with abnormal left toe-brachial index. Pitting edema is unchanged, and crackles still noted in right lung on physical exam, so will continue to monitor. - IV Lasix 80mg  twice daily - Continue metolazone 5mg   - Continue acetazolamide 125 mg po BID - Continue spironolactone 25mg  twice daily - Strict I/O's and daily weights - 1844mL daily fluid restriction - Continue [employer] for right leg - Cardiac monitoring - Will need vascular follow-up outpatient to discuss ABI findings   #Anemia Hgb noted to be around 14 last year. On admission, patient with Hgb of 10.  No obvious signs of bleeding. Iron studies WNL except for decreased sat ratio of 13%.  Ferritin 78. Gave 25mg  test dose of iron dextran, followed by 1000mg  dose. - Trend CBC   #History of hypokalemia Patient reports history of low potassium, and he has had difficulty keeping his potassium level above goal of 4.  Potassium of 3.8 today.  Patient states that he prefers taking potassium as a pill rather than a packet. -  Continue potassium chloride 61mEq PO twice daily  #Atrial tachycardia Patient has  no history of arrhythmia but did have severe dilatation of the left atrial on echo performed on 10/10. Will continue to monitor. - Cardiac monitoring  #Right Great Toe Wound Patient had nail loss of the right great toe with excoriation over the nail area because he stumped his toe and tore his nail off. There was scant, serous drainage on the dressing. - Wound Care saw the patient, appreciate assistance      - Daily dressing changes      - Monitor for signs/symptoms of infection   #T2DM Patient is on 20-30 units of Humalog with meals, no basal dose. Most recent A1c of 6.0 in April 2022. Most recent CBG of 130. - NovoLog 10 units 3 times daily with meals - SSI, moderate - CBG 4x daily   #CKD stage IIIb Baseline creatinine ~2. Creatinine today of 2.38, GFR 29. - Trend BMP - Avoid nephrotoxic meds, including ACE/ARB   #H/o HCC s/p liver transplant Patient states that he underwent liver transplant 9 years ago. He has not had any issues since. He takes mycophenolate at home. - Continue mycophenolate 1000mg  PO twice daily   #HTN BP of 117/55 today, has been stable since arrival with only a few readings indicative of hypotension. - Continue hydralazine 25mg  PO every morning and 12.5mg  PO at bedtime.   #HLD - Continue gemfibrozil 300mg  PO twice daily   #Gout - Continue allopurinol 150mg  PO daily     Best Practice Diet: Heart Healthy, Fluid restriction 1828mL IVF: None VTE: Lovenox 40mg  injection daily Code: Full   Prior to Admission Living Arrangement: Home Anticipated Discharge Location: Home Barriers to Discharge: Management of heart failure exacerbation Dispo: Anticipated discharge pending medical management of heart failure exacerbation     LOS: 4 days   [name], MD 11/26/2020, 11:02 AM

## 2020-11-27 LAB — GLUCOSE, CAPILLARY
Glucose-Capillary: 116 mg/dL — ABNORMAL HIGH (ref 70–99)
Glucose-Capillary: 123 mg/dL — ABNORMAL HIGH (ref 70–99)
Glucose-Capillary: 86 mg/dL (ref 70–99)
Glucose-Capillary: 123 mg/dL — ABNORMAL HIGH (ref 70–99)

## 2020-11-27 LAB — BASIC METABOLIC PANEL
Anion gap: 13 (ref 5–15)
BUN: 103 mg/dL — ABNORMAL HIGH (ref 8–23)
CO2: 26 mmol/L (ref 22–32)
Calcium: 9.7 mg/dL (ref 8.9–10.3)
Chloride: 91 mmol/L — ABNORMAL LOW (ref 98–111)
Creatinine, Ser: 2.4 mg/dL — ABNORMAL HIGH (ref 0.61–1.24)
GFR, Estimated: 29 mL/min — ABNORMAL LOW (ref >60)
Glucose, Bld: 111 mg/dL — ABNORMAL HIGH (ref 70–99)
Potassium: 3.8 mmol/L (ref 3.5–5.1)
Sodium: 130 mmol/L — ABNORMAL LOW (ref 135–145)

## 2020-11-27 LAB — CBC
HCT: 30.3 % — ABNORMAL LOW (ref 39.0–52.0)
Hemoglobin: 10 g/dL — ABNORMAL LOW (ref 13.0–17.0)
MCH: 30.4 pg (ref 26.0–34.0)
MCHC: 33 g/dL (ref 30.0–36.0)
MCV: 92.1 fL (ref 80.0–100.0)
Platelets: 120 10*3/uL — ABNORMAL LOW (ref 150–400)
RBC: 3.29 MIL/uL — ABNORMAL LOW (ref 4.22–5.81)
RDW: 17 % — ABNORMAL HIGH (ref 11.5–15.5)
WBC: 5.6 10*3/uL (ref 4.0–10.5)
nRBC: 0 % (ref 0.0–0.2)

## 2020-11-27 MED ORDER — METOLAZONE 5 MG PO TABS
5.0000 mg | ORAL_TABLET | Freq: Every day | ORAL | Status: DC
  Administered 2020-11-27 – 2020-12-02 (×6): 5 mg via ORAL
  Filled 2020-11-27 (×6): qty 1

## 2020-11-27 NOTE — Progress Notes (Signed)
Gregory Tanner is a 65 year old male with a past medical history of HFpEF, CKD Stage IIIB, HCC s/p liver transplant, Type II Diabetes Mellitus, BPH, and HTN who presented with fluid retention and was admitted for HFpEF exacerbation.  Subjective:  Patient had no acute overnight events.  Patient was seen at bedside this morning. He states that he had a good night overall. Endorses some coughing overnight; states that once he coughed up a greenish sputum but since then it has been foamy/clear. No other complaints or concerns.  Objective:  Vital signs in last 24 hours: Vitals:   11/26/20 1054 11/26/20 1729 11/26/20 2042 11/27/20 0458  BP:  127/61 (!) 115/56 (!) 113/59  Pulse:  93 91 88  Resp: 20 (!) 21 18 18   Temp:   98.2 F (36.8 C) (!) 97.5 F (36.4 C)  TempSrc:   Oral Oral  SpO2:   97% 98%  Weight:    91.3 kg  Height:       Weight change: -0.59 kg  Intake/Output Summary (Last 24 hours) at 11/27/2020 0817 Last data filed at 11/27/2020 0500 Gross per 24 hour  Intake 597 ml  Output 2900 ml  Net -2303 ml    Physical Exam Constitutional: Normal appearance, sitting in his chair very alert.  HENT: Normocephalic and atraumatic.  Cardiovascular: Regular rate and rhythm.  Pulmonary: Pulmonary effort is normal. No respiratory distress. Fine crackles in right lung, most noticeable at the base, slightly improved compared to exam yesterday. Abdominal: Bowel distension (unchanged from yesterday). Normal bowel sounds.  No tenderness to palpation. Musculoskeletal: Lower extremity swelling, 3+ LLE pitting edema. Unna boot intact to right side with 2+ pitting edema that has improved. No pain or tenderness with palpation.  Skin: Warm and dry. Dark red/purple left shin skin discoloration. Unna boot on right leg. Neurological: No focal deficit present. Alert and oriented to person, place, and time.  Psychiatric: Mood and behavior normal.   Assessment/Plan:  Principal Problem:   Acute  exacerbation of CHF (congestive heart failure) (HCC) Active Problems:   Type 2 diabetes mellitus ([place])   History of liver transplant ([place])   Thrombocytopenia (HCC)   Iron deficiency anemia  Gregory Tanner is a 65 year old male with a past medical history of HFpEF, CKD Stage IIIB, HCC s/p liver transplant, Type II Diabetes Mellitus, BPH, and HTN who presented with fluid retention and was admitted for HFpEF exacerbation.   #HFpEF exacerbation Weight on admit was 92.1 kg, and weight today is 91.3 kg. Patient said he has had improved urine output, recorded at 2.9 L since yesterday. He has been diuresing well since adding acetazolamide to his regimen, so will continue his diuresis as below. ABIs demonstrated moderate left lower extremity arterial disease with abnormal left toe-brachial index. Pitting edema has improved on the right and crackles still noted in right lung on physical exam, so will continue to monitor. - IV Lasix 80mg  twice daily - Continue metolazone 5mg   - Continue acetazolamide 125 mg po BID - Continue spironolactone 25mg  twice daily - Strict I/O's and daily weights - 188mL daily fluid restriction - Continue [employer] for right leg - Cardiac monitoring - Will need vascular follow-up outpatient to discuss ABI findings   #Anemia Hgb noted to be around 14 last year. On admission, patient with Hgb of 10.  No obvious signs of bleeding. Iron studies WNL except for decreased sat ratio of 13%. Ferritin 78. Gave 25mg  test dose of iron dextran, followed by 1000mg  dose. - Trend  CBC   #History of hypokalemia Patient reports history of low potassium, and he has had difficulty keeping his potassium level above goal of 4. Potassium of 3.8 today. Patient states that he prefers taking potassium as a pill rather than a packet. -  Continue potassium chloride 67mEq PO twice daily  #Atrial tachycardia Patient has no history of arrhythmia but did have severe dilatation of the left atrial on echo  performed on 10/10. Will continue to monitor. - Cardiac monitoring  #Right Great Toe Wound Patient had nail loss of the right great toe with excoriation over the nail area because he stumped his toe and tore his nail off. There was scant, serous drainage on the dressing. - Wound Care saw the patient, appreciate assistance      - Daily dressing changes      - Monitor for signs/symptoms of infection   #T2DM Patient is on 20-30 units of Humalog with meals, no basal dose. Most recent A1c of 6.0 in April 2022. Most recent CBG of 123. - NovoLog 10 units 3 times daily with meals - SSI, moderate - CBG 4x daily   #CKD stage IIIb Baseline creatinine ~2. Creatinine today of 2.40, GFR 29. - Trend BMP - Avoid nephrotoxic meds, including ACE/ARB   #H/o HCC s/p liver transplant Patient states that he underwent liver transplant 9 years ago. He has not had any issues since. He takes mycophenolate at home. - Continue mycophenolate 1000mg  PO twice daily   #HTN BP of 112/57 today, has been stable since arrival with only a few readings indicative of hypotension. - Continue hydralazine 25mg  PO every morning and 12.5mg  PO at bedtime.   #HLD - Continue gemfibrozil 300mg  PO twice daily   #Gout - Continue allopurinol 150mg  PO daily     Best Practice Diet: 2 gram sodium, Fluid restriction 1851mL IVF: None VTE: Lovenox 40mg  injection daily Code: Full   Prior to Admission Living Arrangement: Home Anticipated Discharge Location: Home Barriers to Discharge: Management of heart failure exacerbation Dispo: Anticipated discharge pending medical management of heart failure exacerbation     LOS: 5 days   [name], MD 11/27/2020, 8:17 AM

## 2020-11-27 NOTE — TOC Progression Note (Signed)
Transition of Care [facility]) - Progression Note    Patient Details  Name: Gregory Tanner MRN: [id number] Date of Birth: 08/30/1955  [facility] of [facility]) CM/SW Contact  Zenon Mayo, RN Phone Number: 11/27/2020, 9:20 PM  Clinical Narrative:    From home, CHF ex, continue to diurese, TOC will continue to follow for dc needs.        Expected Discharge Plan and Services                                                 Social Determinants of Health (SDOH) Interventions    Readmission Risk Interventions No flowsheet data found.

## 2020-11-28 LAB — BASIC METABOLIC PANEL
Anion gap: 15 (ref 5–15)
BUN: 108 mg/dL — ABNORMAL HIGH (ref 8–23)
CO2: 24 mmol/L (ref 22–32)
Calcium: 9.4 mg/dL (ref 8.9–10.3)
Chloride: 90 mmol/L — ABNORMAL LOW (ref 98–111)
Creatinine, Ser: 2.46 mg/dL — ABNORMAL HIGH (ref 0.61–1.24)
GFR, Estimated: 28 mL/min — ABNORMAL LOW (ref >60)
Glucose, Bld: 96 mg/dL (ref 70–99)
Potassium: 4.1 mmol/L (ref 3.5–5.1)
Sodium: 129 mmol/L — ABNORMAL LOW (ref 135–145)

## 2020-11-28 LAB — CBC
HCT: 30.5 % — ABNORMAL LOW (ref 39.0–52.0)
Hemoglobin: 9.8 g/dL — ABNORMAL LOW (ref 13.0–17.0)
MCH: 29.7 pg (ref 26.0–34.0)
MCHC: 32.1 g/dL (ref 30.0–36.0)
MCV: 92.4 fL (ref 80.0–100.0)
Platelets: 139 10*3/uL — ABNORMAL LOW (ref 150–400)
RBC: 3.3 MIL/uL — ABNORMAL LOW (ref 4.22–5.81)
RDW: 17.2 % — ABNORMAL HIGH (ref 11.5–15.5)
WBC: 6.4 10*3/uL (ref 4.0–10.5)
nRBC: 0 % (ref 0.0–0.2)

## 2020-11-28 LAB — GLUCOSE, CAPILLARY
Glucose-Capillary: 127 mg/dL — ABNORMAL HIGH (ref 70–99)
Glucose-Capillary: 186 mg/dL — ABNORMAL HIGH (ref 70–99)
Glucose-Capillary: 115 mg/dL — ABNORMAL HIGH (ref 70–99)
Glucose-Capillary: 155 mg/dL — ABNORMAL HIGH (ref 70–99)
Glucose-Capillary: 164 mg/dL — ABNORMAL HIGH (ref 70–99)

## 2020-11-28 LAB — SODIUM: Sodium: 131 mmol/L — ABNORMAL LOW (ref 135–145)

## 2020-11-28 NOTE — Progress Notes (Signed)
D/c teli per MD. Silvestre Mesi informed .

## 2020-11-28 NOTE — Plan of Care (Signed)

## 2020-11-28 NOTE — Progress Notes (Addendum)
Placed patient back on teli after was removed per MD request .

## 2020-11-28 NOTE — Progress Notes (Addendum)
Subjective:  Patient had no acute overnight events.  Patient evaluated bedside.  Continues to have some coughing which is associated with postnasal drainage.  Otherwise not feeling shortness of breath.  Lower extremity edema is slowly improving.  Feels that abdomen also softer today.   Objective:  Vital signs in last 24 hours: Vitals:   11/27/20 0458 11/27/20 1126 11/27/20 2100 11/28/20 0400  BP: (!) 113/59 (!) 112/57 (!) 112/58 (!) 116/53  Pulse: 88 86 84 87  Resp: 18 (!) 21    Temp: (!) 97.5 F (36.4 C) 97.9 F (36.6 C) (!) 97.1 F (36.2 C) 98 F (36.7 C)  TempSrc: Oral Oral Oral Oral  SpO2: 98% 99% 100%   Weight: 91.3 kg   90.4 kg  Height:       Weight change: -0.864 kg  Intake/Output Summary (Last 24 hours) at 11/28/2020 0955 Last data filed at 11/28/2020 7782 Gross per 24 hour  Intake 800 ml  Output 2805 ml  Net -2005 ml    Physical Exam Constitutional: Sitting upright in chair appears comfortable in no acute distress HENT: Normocephalic and atraumatic.  Cardiovascular: Regular rate and rhythm.  No murmurs Pulmonary: Minimal crackles at bilateral lung bases improved from yesterday. Abdominal: Abdominal distention, somewhat softer than yesterday, Normal bowel sounds.  No tenderness to palpation. Musculoskeletal: 2+ edema of the RLE in unna boot, 3+ of LLE Skin: Warm and dry. Dark red/purple left shin skin discoloration. Unna boot on right leg. Neurological: Aox3, moving all extermities Psychiatric: Mood and behavior normal.  Assessment/Plan:  Principal Problem:   Acute exacerbation of CHF (congestive heart failure) (HCC) Active Problems:   Type 2 diabetes mellitus ([place])   History of liver transplant ([place])   Thrombocytopenia (HCC)   Iron deficiency anemia  Gregory Tanner is a 65 year old male with a past medical history of HFpEF, CKD Stage IIIB, HCC s/p liver transplant, Type II Diabetes Mellitus, BPH, and HTN who presented with fluid retention and was  admitted for HFpEF exacerbation.   #HFpEF exacerbation Weight on admit was 92.1 kg now down to 90.4 kg.  Net 2 L negative yesterday with 1 kg weight loss.  Continuing to do well with diuresis.  Lower extremity edema is slowly improving.  Lung sounds also improving.  We will continue to diurese. - IV Lasix 80mg  twice daily - metolazone 12.5 mg, acetazolamide 125 mg twice daily, spironolactone 25 mg twice daily -Potassium 40 mg twice daily - Strict I/O's and daily weights - Continue Unna Boot for right leg - Cardiac monitoring - BMP, mag replete electrolytes as needed - Outpatient vascular follow-up for moderate PAD of the left lower extremity   #Hyponatremia Likely hypervolemic hyponatremia. Na slowly downtrending despite diuresis.  - Afternoon sodium -May need to discuss with HF if sodium continues to decrease in improve diuresis   #Anemia Received IV iron hemoglobin stable at 9.8 -Monitor CBC   #Atrial tachycardia Patient has no history of arrhythmia but did have severe dilatation of the left atrial on echo performed on 10/10. Will continue to monitor. - Cardiac monitoring  #Right Great Toe Wound Patient had nail loss of the right great toe with excoriation over the nail area because he stumped his toe and tore his nail off.  -Continue wound care   #T2DM Patient is on 20-30 units of Humalog with meals, no basal dose. Most recent A1c of 6.0 in April 2022.  CBGs well controlled this morning. - NovoLog 10 units 3 times daily with meals -  SSI, moderate - CBG 4x daily   #CKD stage IIIb Any function remained stable - Trend BMP - Avoid nephrotoxic meds, including ACE/ARB   #H/o HCC s/p liver transplant Patient states that he underwent liver transplant 9 years ago. He has not had any issues since. He takes mycophenolate at home. - Continue mycophenolate 1000mg  PO twice daily   #HTN #HLD -Hydralazine 25 mg mornings, 12.5 mg evenings - gemfibrozil 300mg  PO twice daily    #Gout - Continue allopurinol 150mg  PO daily     Best Practice Diet: Salt restricted diet, 1.8 L fluid restriction IVF: None VTE: Enoxaparin Code: Full   Prior to Admission Living Arrangement: Home Anticipated Discharge Location: Home Barriers to Discharge: continue diuresis Dispo: Anticipated discharge pending medical management of heart failure exacerbation   LOS: 6 days   Iona Beard, MD 11/28/2020, 9:55 AM

## 2020-11-29 ENCOUNTER — Inpatient Hospital Stay (HOSPITAL_COMMUNITY): Payer: Medicare Other

## 2020-11-29 DIAGNOSIS — R188 Other ascites: Secondary | ICD-10-CM

## 2020-11-29 DIAGNOSIS — I5033 Acute on chronic diastolic (congestive) heart failure: Secondary | ICD-10-CM | POA: Diagnosis not present

## 2020-11-29 DIAGNOSIS — Z944 Liver transplant status: Secondary | ICD-10-CM

## 2020-11-29 LAB — COMPREHENSIVE METABOLIC PANEL
ALT: 14 U/L (ref 0–44)
AST: 21 U/L (ref 15–41)
Albumin: 4.5 g/dL (ref 3.5–5.0)
Alkaline Phosphatase: 77 U/L (ref 38–126)
Anion gap: 15 (ref 5–15)
BUN: 110 mg/dL — ABNORMAL HIGH (ref 8–23)
CO2: 24 mmol/L (ref 22–32)
Calcium: 9.5 mg/dL (ref 8.9–10.3)
Chloride: 91 mmol/L — ABNORMAL LOW (ref 98–111)
Creatinine, Ser: 2.46 mg/dL — ABNORMAL HIGH (ref 0.61–1.24)
GFR, Estimated: 28 mL/min — ABNORMAL LOW (ref >60)
Glucose, Bld: 148 mg/dL — ABNORMAL HIGH (ref 70–99)
Potassium: 4.1 mmol/L (ref 3.5–5.1)
Sodium: 130 mmol/L — ABNORMAL LOW (ref 135–145)
Total Bilirubin: 1.2 mg/dL (ref 0.3–1.2)
Total Protein: 7.1 g/dL (ref 6.5–8.1)

## 2020-11-29 LAB — CBC
HCT: 31.3 % — ABNORMAL LOW (ref 39.0–52.0)
Hemoglobin: 10.1 g/dL — ABNORMAL LOW (ref 13.0–17.0)
MCH: 30 pg (ref 26.0–34.0)
MCHC: 32.3 g/dL (ref 30.0–36.0)
MCV: 92.9 fL (ref 80.0–100.0)
Platelets: 129 10*3/uL — ABNORMAL LOW (ref 150–400)
RBC: 3.37 MIL/uL — ABNORMAL LOW (ref 4.22–5.81)
RDW: 17.2 % — ABNORMAL HIGH (ref 11.5–15.5)
WBC: 5.4 10*3/uL (ref 4.0–10.5)
nRBC: 0 % (ref 0.0–0.2)

## 2020-11-29 LAB — GLUCOSE, CAPILLARY
Glucose-Capillary: 183 mg/dL — ABNORMAL HIGH (ref 70–99)
Glucose-Capillary: 150 mg/dL — ABNORMAL HIGH (ref 70–99)
Glucose-Capillary: 127 mg/dL — ABNORMAL HIGH (ref 70–99)
Glucose-Capillary: 180 mg/dL — ABNORMAL HIGH (ref 70–99)

## 2020-11-29 LAB — BRAIN NATRIURETIC PEPTIDE: B Natriuretic Peptide: 222.9 pg/mL — ABNORMAL HIGH (ref 0.0–100.0)

## 2020-11-29 LAB — MAGNESIUM: Magnesium: 2.9 mg/dL — ABNORMAL HIGH (ref 1.7–2.4)

## 2020-11-29 MED ORDER — INSULIN ASPART 100 UNIT/ML IJ SOLN
0.0000 [IU] | Freq: Three times a day (TID) | INTRAMUSCULAR | Status: DC
  Administered 2020-11-29: 2 [IU] via SUBCUTANEOUS
  Administered 2020-11-30: 5 [IU] via SUBCUTANEOUS
  Administered 2020-11-30: 3 [IU] via SUBCUTANEOUS
  Administered 2020-11-30 – 2020-12-01 (×2): 2 [IU] via SUBCUTANEOUS
  Administered 2020-12-01: 3 [IU] via SUBCUTANEOUS
  Administered 2020-12-01 – 2020-12-03 (×3): 2 [IU] via SUBCUTANEOUS

## 2020-11-29 MED ORDER — INSULIN ASPART 100 UNIT/ML IJ SOLN: 0.0000 [IU] | Freq: Every day | INTRAMUSCULAR | Status: DC

## 2020-11-29 NOTE — Progress Notes (Signed)
Subjective: Patient reports continued difficulty with sleeping due to his cough.  Feels that his lower extremity edema is improved as well as abdominal distention.  He discussed that previously he had liver transplant secondary to cirrhosis leading to hepatocellular carcinoma.  States he had extensive work-up to causes of his cirrhosis but none were found at that time.  Notes he has had 2 prior paracentesis  Objective:  Vital signs in last 24 hours: Vitals:   11/27/20 2100 11/28/20 0400 11/28/20 2024 11/29/20 0500  BP: (!) 112/58 (!) 116/53 (!) 113/59   Pulse: 84 87 84   Resp:   16   Temp: (!) 97.1 F (36.2 C) 98 F (36.7 C) 97.7 F (36.5 C)   TempSrc: Oral Oral Oral   SpO2: 100%  99%   Weight:  90.4 kg  90.1 kg  Height:       Weight change: -0.27 kg  Intake/Output Summary (Last 24 hours) at 11/29/2020 2505 Last data filed at 11/29/2020 0434 Gross per 24 hour  Intake 700 ml  Output 2675 ml  Net -1975 ml    Physical Exam Constitutional: Sitting upright in chair appears comfortable in no acute distress HENT: Normocephalic and atraumatic.  Cardiovascular: Regular rate and rhythm.  No murmurs Pulmonary: Minimal crackles at bilateral lung bases improved from yesterday. Abdominal: Abdominal distention, somewhat softer than yesterday, Normal bowel sounds.  No tenderness to palpation. Musculoskeletal: 2+ edema of the RLE in unna boot, 3+ of LLE Skin: Warm and dry. Dark red/purple left shin skin discoloration. Unna boot on right leg. Neurological: Aox3, moving all extermities Psychiatric: Mood and behavior normal.  Assessment/Plan:  Principal Problem:   Acute exacerbation of CHF (congestive heart failure) (HCC) Active Problems:   Type 2 diabetes mellitus ([place])   History of liver transplant ([place])   Thrombocytopenia (HCC)   Iron deficiency anemia  Senica Crall is a 65 year old male with a past medical history of HFpEF, CKD Stage IIIB, HCC s/p liver transplant, Type II  Diabetes Mellitus, BPH, and HTN who presented with fluid retention and was admitted for HFpEF exacerbation.   #HFpEF exacerbation Continues to have slow diureses with 2.6 L of urine output overnight.  Weight has been stable overnight.  Lower extremity edema slowly improving however patient continues to have a cough.  Repeat CXR on 10/16 shows continued pulmonary edema and volume overload.  BNP is improving from 352>222 this morning.  We will continue on current diuretics. - IV Lasix 80mg  twice daily - metolazone 12.5 mg, acetazolamide 125 mg twice daily, spironolactone 25 mg twice daily -Potassium 40 mg twice daily - Strict I/O's and daily weights - Continue Unna Boot for right leg - Cardiac monitoring -Daily BMP - Outpatient vascular follow-up for moderate PAD of the left lower extremity   #H/o HCC s/p liver transplant Patient states that he underwent liver transplant 9 years ago.  Notes there was no clear source of his cirrhosis.  This time he has had 2 paracentesis prior.  On review he had a saag of 1.3 concerning for portal hypertension.  Concerned that his ascites and edema may be related to underlying liver dysfunction.  Will evaluate ultrasound of liver today.  We will place IR consult for paracentesis to further evaluate his abdominal ascites.  His liver enzymes have remained stable.  PT/INR WNL on admission.  Consider GI consult for possible complications of liver transplant that may be contributing to his continued ascites. - RUQ US - IR consult for paracentesis - Continue  mycophenolate 1000mg  PO twice daily  #Hyponatremia Sodium stable at 130 this morning. -Monitor BMP  #Anemia Received IV iron. Hemoglobin remained stable at 10.1. -Monitor CBC   #Atrial tachycardia Patient has no history of arrhythmia but did have severe dilatation of the left atrial on echo performed on 10/10. Will continue to monitor. - Cardiac monitoring  #Right Great Toe Wound Patient had nail loss of  the right great toe with excoriation over the nail area because he stumped his toe and tore his nail off.  -Continue wound care   #T2DM Patient is on 20-30 units of Humalog with meals, no basal dose. Most recent A1c of 6.0 in April 2022.  CBGs well controlled this morning. - NovoLog 10 units 3 times daily with meals - SSI, moderate - CBG 4x daily   #CKD stage IIIb Any function remained stable - Trend BMP - Avoid nephrotoxic meds, including ACE/ARB   #HTN #HLD -Hydralazine 25 mg mornings, 12.5 mg evenings - gemfibrozil 300mg  PO twice daily   #Gout - Continue allopurinol 150mg  PO daily     Best Practice Diet: Salt restricted diet, 1.8 L fluid restriction IVF: None VTE: Enoxaparin Code: Full   Prior to Admission Living Arrangement: Home Anticipated Discharge Location: Home Barriers to Discharge: continue diuresis Dispo: Anticipated discharge pending medical management of heart failure exacerbation   LOS: 7 days   Iona Beard, MD 11/29/2020, 6:35 AM

## 2020-11-29 NOTE — Plan of Care (Signed)
  Problem: Education: Goal: Knowledge of General Education information will improve Description Including pain rating scale, medication(s)/side effects and non-pharmacologic comfort measures Outcome: Progressing   Problem: Health Behavior/Discharge Planning: Goal: Ability to manage health-related needs will improve Outcome: Progressing   Problem: Pain Managment: Goal: General experience of comfort will improve Outcome: Progressing   Problem: Safety: Goal: Ability to remain free from injury will improve Outcome: Progressing   Problem: Activity: Goal: Capacity to carry out activities will improve Outcome: Progressing   

## 2020-11-30 ENCOUNTER — Inpatient Hospital Stay (HOSPITAL_COMMUNITY): Payer: Medicare Other

## 2020-11-30 ENCOUNTER — Other Ambulatory Visit (HOSPITAL_COMMUNITY): Payer: Self-pay

## 2020-11-30 DIAGNOSIS — I5033 Acute on chronic diastolic (congestive) heart failure: Secondary | ICD-10-CM | POA: Diagnosis not present

## 2020-11-30 HISTORY — PX: IR PARACENTESIS: IMG2679

## 2020-11-30 LAB — BODY FLUID CELL COUNT WITH DIFFERENTIAL
Lymphs, Fluid: 77 %
Monocyte-Macrophage-Serous Fluid: 18 % — ABNORMAL LOW (ref 50–90)
Neutrophil Count, Fluid: 5 % (ref 0–25)
Total Nucleated Cell Count, Fluid: 312 cu mm (ref 0–1000)

## 2020-11-30 LAB — BASIC METABOLIC PANEL
Anion gap: 15 (ref 5–15)
BUN: 109 mg/dL — ABNORMAL HIGH (ref 8–23)
CO2: 24 mmol/L (ref 22–32)
Calcium: 9.8 mg/dL (ref 8.9–10.3)
Chloride: 91 mmol/L — ABNORMAL LOW (ref 98–111)
Creatinine, Ser: 2.27 mg/dL — ABNORMAL HIGH (ref 0.61–1.24)
GFR, Estimated: 31 mL/min — ABNORMAL LOW (ref >60)
Glucose, Bld: 115 mg/dL — ABNORMAL HIGH (ref 70–99)
Potassium: 3.7 mmol/L (ref 3.5–5.1)
Sodium: 130 mmol/L — ABNORMAL LOW (ref 135–145)

## 2020-11-30 LAB — CBC
HCT: 32.2 % — ABNORMAL LOW (ref 39.0–52.0)
Hemoglobin: 10.3 g/dL — ABNORMAL LOW (ref 13.0–17.0)
MCH: 29.7 pg (ref 26.0–34.0)
MCHC: 32 g/dL (ref 30.0–36.0)
MCV: 92.8 fL (ref 80.0–100.0)
Platelets: 129 10*3/uL — ABNORMAL LOW (ref 150–400)
RBC: 3.47 MIL/uL — ABNORMAL LOW (ref 4.22–5.81)
RDW: 17.2 % — ABNORMAL HIGH (ref 11.5–15.5)
WBC: 6 10*3/uL (ref 4.0–10.5)
nRBC: 0 % (ref 0.0–0.2)

## 2020-11-30 LAB — GLUCOSE, CAPILLARY
Glucose-Capillary: 135 mg/dL — ABNORMAL HIGH (ref 70–99)
Glucose-Capillary: 208 mg/dL — ABNORMAL HIGH (ref 70–99)
Glucose-Capillary: 128 mg/dL — ABNORMAL HIGH (ref 70–99)
Glucose-Capillary: 136 mg/dL — ABNORMAL HIGH (ref 70–99)

## 2020-11-30 LAB — ALBUMIN: Albumin: 4.8 g/dL (ref 3.5–5.0)

## 2020-11-30 LAB — ALBUMIN, PLEURAL OR PERITONEAL FLUID: Albumin, Fluid: 3.6 g/dL

## 2020-11-30 LAB — PROTEIN, PLEURAL OR PERITONEAL FLUID: Total protein, fluid: 5.3 g/dL

## 2020-11-30 MED ORDER — POTASSIUM CHLORIDE CRYS ER 20 MEQ PO TBCR
30.0000 meq | EXTENDED_RELEASE_TABLET | Freq: Once | ORAL | Status: AC
  Administered 2020-11-30: 30 meq via ORAL
  Filled 2020-11-30: qty 1

## 2020-11-30 MED ORDER — LIDOCAINE HCL 1 % IJ SOLN
INTRAMUSCULAR | Status: AC
  Filled 2020-11-30: qty 20

## 2020-11-30 MED ORDER — LIDOCAINE HCL (PF) 1 % IJ SOLN
INTRAMUSCULAR | Status: DC | PRN
  Administered 2020-11-30: 10 mL

## 2020-11-30 NOTE — TOC Benefit Eligibility Note (Signed)
Patient Teacher, English as a foreign language completed.    The patient is currently admitted and upon discharge could be taking Jardiance 10 mg.  The current 30 day co-pay is, $143.85 due to being in Coverage Gap (donut hole).   The patient is insured through [employer] [place]  Medicare Part D     [name], [place] Patient Advocate Specialist [place] Team Direct Number: [phone]  Fax: [phone]

## 2020-11-30 NOTE — Procedures (Signed)
Ultrasound-guided diagnostic and therapeutic paracentesis performed yielding 1.9 liters of serosanguinous colored fluid.  Fluid was sent to lab for analysis. No immediate complications. EBL is none.

## 2020-11-30 NOTE — Progress Notes (Signed)
Subjective: Overnight, there were no acute events.  He continues to endorse a cough which appears frothy in nature.  He denies pleuritic chest pain.  Shortness of breath is worse after he coughs.  He does feel that his lower extremity edema has improved and states that his abdomen feels less tight after diuresis.  No other complaints or concerns today.  Objective:  Vital signs in last 24 hours: Vitals:   11/29/20 1153 11/29/20 2105 11/30/20 0500 11/30/20 0521  BP: (!) 109/49 (!) 116/49  120/75  Pulse: 86 87  86  Resp: 18 16  16   Temp: 97.8 F (36.6 C) 97.9 F (36.6 C)  97.8 F (36.6 C)  TempSrc: Oral Oral  Oral  SpO2: 97% 98%  98%  Weight:   88.9 kg   Height:       Weight change: -1.27 kg  Intake/Output Summary (Last 24 hours) at 11/30/2020 4562 Last data filed at 11/29/2020 2111 Gross per 24 hour  Intake 900 ml  Output 2700 ml  Net -1800 ml    Physical Exam Constitutional: Sitting upright in chair appears comfortable in no acute distress HENT: Normocephalic and atraumatic.  Cardiovascular: Regular rate and rhythm.  No murmurs Pulmonary: Normal pulmonary effort.  No wheezing, rhonchi, or rales appreciated. Abdominal: Abdominal distention, somewhat softer than exam prior, Normal bowel sounds. No tenderness to palpation. Musculoskeletal: 2+ edema of the RLE in unna boot, 3+ of LLE Skin: Warm and dry. Dark red/purple left shin skin discoloration.  There is dark purple ecchymosis over the RLQ and LLQ.  Unna boot on right leg. Neurological: Aox3, moving all extermities, no focal deficit appreciated Psychiatric: Mood and behavior normal.  Assessment/Plan:  Principal Problem:   Acute exacerbation of CHF (congestive heart failure) (HCC) Active Problems:   Type 2 diabetes mellitus ([place])   History of liver transplant ([place])   Thrombocytopenia (HCC)   Iron deficiency anemia   Ascites  Gregory Tanner is a 65 year old male with a past medical history of HFpEF, CKD Stage  IIIB, HCC s/p liver transplant, Type II Diabetes Mellitus, BPH, and HTN who presented with fluid retention and was admitted for HFpEF exacerbation.   #HFpEF exacerbation Patient has had 2.7 L of urine output since yesterday.  Weight 88.9 kg from 90.1 kg yesterday, compared to 92.1 kg on admit.  Lower extremity edema is slowly improving however patient continues to have a cough. Repeat CXR on 10/16 shows continued pulmonary edema and volume overload. We will continue on current diuretic regimen as below.  Consider heart failure consult pending paracentesis today. - IV Lasix 80mg  twice daily - Metolazone 12.5 mg, acetazolamide 125 mg twice daily, spironolactone 25 mg twice daily -Potassium 40 mg twice daily - Strict I/O's and daily weights - Continue Unna Boot for right leg - Cardiac monitoring - Daily BMP - Outpatient vascular follow-up for moderate PAD of the left lower extremity   #H/o HCC s/p liver transplant Patient states that he underwent liver transplant 9 years ago.  Notes there was no clear source of his cirrhosis.  This time he has had 2 paracentesis prior.  On review he had a saag of 1.3 concerning for portal hypertension.  Concerned that his ascites and edema may be related to underlying liver dysfunction.  RUQ ultrasound did not show ascites.  IR consult placed, plan for paracentesis today. His liver enzymes have remained stable.  PT/INR WNL on admission.  Consider GI consult for possible complications of liver transplant that may be contributing  to his continued ascites. - Paracentesis by IR today, peritoneal fluid labs pending - Continue mycophenolate 1000mg  PO twice daily  #Hyponatremia Sodium stable at 130 this morning. -Monitor BMP  #Anemia Received IV iron. Hemoglobin remained stable at 10.3. -Monitor CBC   #Atrial tachycardia Patient has no history of arrhythmia but did have severe dilatation of the left atrial on echo performed on 10/10. Will continue to monitor. -  Cardiac monitoring  #Right Great Toe Wound Patient had nail loss of the right great toe with excoriation over the nail area because he stumped his toe and tore his nail off.  -Continue wound care   #T2DM Patient is on 20-30 units of Humalog with meals, no basal dose. Most recent A1c of 6.0 in April 2022.  CBGs well controlled this morning. - NovoLog 10 units 3 times daily with meals - SSI, moderate - CBG 4x daily   #CKD stage IIIb Kidney function remained stable overall, Cr 2.27 from 2.46 yesterday.  - Trend BMP - Avoid nephrotoxic meds, including ACE/ARB   #HTN #HLD -Hydralazine 25 mg mornings, 12.5 mg evenings - gemfibrozil 300mg  PO twice daily   #Gout - Continue allopurinol 150mg  PO daily     Best Practice Diet: Salt restricted diet, 1.8 L fluid restriction IVF: None VTE: Enoxaparin Code: Full   Prior to Admission Living Arrangement: Home Anticipated Discharge Location: Home Barriers to Discharge: continue diuresis Dispo: Anticipated discharge pending medical management of heart failure exacerbation   LOS: 8 days   Gregory Brill, MD 11/30/2020, 6:37 AM

## 2020-11-30 NOTE — Progress Notes (Signed)
Orthopedic Tech Progress Note Patient Details:  Gregory Tanner [birth year] [id number]  Ortho Devices Type of Ortho Device: Louretta Parma boot Ortho Device/Splint Location: LRE Ortho Device/Splint Interventions: Ordered, Application, Adjustment   Post Interventions Patient Tolerated: Well  [name] 11/30/2020, 9:13 PM

## 2020-11-30 NOTE — Care Management Important Message (Signed)
Important Message  Patient Details  Name: WYNDHAM SANTILLI MRN: [id number] Date of Birth: 08-Oct-1955   Medicare Important Message Given:  Yes     [name] 11/30/2020, 10:24 AM

## 2020-12-01 DIAGNOSIS — I5033 Acute on chronic diastolic (congestive) heart failure: Secondary | ICD-10-CM | POA: Diagnosis not present

## 2020-12-01 LAB — CBC
HCT: 33.8 % — ABNORMAL LOW (ref 39.0–52.0)
Hemoglobin: 10.9 g/dL — ABNORMAL LOW (ref 13.0–17.0)
MCH: 30.5 pg (ref 26.0–34.0)
MCHC: 32.2 g/dL (ref 30.0–36.0)
MCV: 94.7 fL (ref 80.0–100.0)
Platelets: 121 10*3/uL — ABNORMAL LOW (ref 150–400)
RBC: 3.57 MIL/uL — ABNORMAL LOW (ref 4.22–5.81)
RDW: 17.2 % — ABNORMAL HIGH (ref 11.5–15.5)
WBC: 5.6 10*3/uL (ref 4.0–10.5)
nRBC: 0 % (ref 0.0–0.2)

## 2020-12-01 LAB — BASIC METABOLIC PANEL
Anion gap: 14 (ref 5–15)
BUN: 115 mg/dL — ABNORMAL HIGH (ref 8–23)
CO2: 27 mmol/L (ref 22–32)
Calcium: 9.9 mg/dL (ref 8.9–10.3)
Chloride: 93 mmol/L — ABNORMAL LOW (ref 98–111)
Creatinine, Ser: 2.44 mg/dL — ABNORMAL HIGH (ref 0.61–1.24)
GFR, Estimated: 28 mL/min — ABNORMAL LOW (ref >60)
Glucose, Bld: 116 mg/dL — ABNORMAL HIGH (ref 70–99)
Potassium: 4.4 mmol/L (ref 3.5–5.1)
Sodium: 134 mmol/L — ABNORMAL LOW (ref 135–145)

## 2020-12-01 LAB — GLUCOSE, CAPILLARY
Glucose-Capillary: 126 mg/dL — ABNORMAL HIGH (ref 70–99)
Glucose-Capillary: 124 mg/dL — ABNORMAL HIGH (ref 70–99)
Glucose-Capillary: 172 mg/dL — ABNORMAL HIGH (ref 70–99)
Glucose-Capillary: 150 mg/dL — ABNORMAL HIGH (ref 70–99)

## 2020-12-01 MED ORDER — ENOXAPARIN SODIUM 30 MG/0.3ML IJ SOSY
30.0000 mg | PREFILLED_SYRINGE | INTRAMUSCULAR | Status: DC
Start: 2020-12-01 — End: 2020-12-03
  Administered 2020-12-01 – 2020-12-02 (×2): 30 mg via SUBCUTANEOUS
  Filled 2020-12-01 (×2): qty 0.3

## 2020-12-01 MED ORDER — GUAIFENESIN 200 MG PO TABS
200.0000 mg | ORAL_TABLET | ORAL | Status: DC | PRN
  Administered 2020-12-01 – 2020-12-02 (×4): 200 mg via ORAL
  Filled 2020-12-01 (×6): qty 1

## 2020-12-01 NOTE — Progress Notes (Signed)
Subjective: Overnight, there were no acute events. He continues to endorse a cough which appears frothy in nature, requesting something for cough today. He does however state that the swelling in his BLE has improved compared to yesterday. There are no other complaints or concerns today.  Objective:  Vital signs in last 24 hours: Vitals:   11/30/20 1157 11/30/20 2337 12/01/20 0428 12/01/20 1112  BP: 125/63 (!) 116/57 116/61 (!) 115/52  Pulse:  85 85 84  Resp:  18 18 18   Temp:  97.9 F (36.6 C) 97.8 F (36.6 C) (!) 97.5 F (36.4 C)  TempSrc:  Oral Oral Oral  SpO2:  99% 99% 99%  Weight:   86.6 kg   Height:       Weight change: -2.268 kg  Intake/Output Summary (Last 24 hours) at 12/01/2020 1343 Last data filed at 12/01/2020 1114 Gross per 24 hour  Intake 600 ml  Output 3250 ml  Net -2650 ml    Physical Exam Constitutional: Sitting upright in chair appears comfortable in no acute distress HENT: Normocephalic and atraumatic.  Cardiovascular: Regular rate and rhythm. No murmurs/rubs/gallops Pulmonary: Normal pulmonary effort.  Minimal crackles in right lower lobe.  No other wheezing, rales, rhonchi appreciated. Abdominal: Abdominal distention, somewhat softer compared to exam prior, Normal bowel sounds. No tenderness to palpation. Musculoskeletal: 1-2+ edema of the RLE in unna boot, 2+ of LLE Skin: Warm and dry. Dark red/purple left shin skin discoloration. There is dark purple ecchymosis over the RLQ and LLQ.  Paracentesis site with overlying gauze appears clean/dry/intact. Unna boot on right leg. Neurological: Aox3, moving all extermities, no focal deficit appreciated Psychiatric: Mood and behavior normal.  Assessment/Plan:  Principal Problem:   Acute exacerbation of CHF (congestive heart failure) (HCC) Active Problems:   Type 2 diabetes mellitus ([place])   History of liver transplant ([place])   Thrombocytopenia (HCC)   Iron deficiency anemia   Ascites  Gregory Tanner is  a 65 year old male with a past medical history of HFpEF, CKD Stage IIIB, HCC s/p liver transplant, Type II Diabetes Mellitus, BPH, and HTN who presented with fluid retention and was admitted for HFpEF exacerbation.   #HFpEF exacerbation Patient has had 3.4 L of urine output since yesterday, and 1.9 L were drawn during paracentesis by IR yesterday. Weight 86.6 kg from 88.9 kg yesterday, compared to 92.1 kg on admit.  Lower extremity edema is improving however patient continues to have a cough. We will continue on current diuretic regimen as below.  We will hold off on consulting heart failure given that patient is having adequate output on current regimen below. Can consider consulting them in the future if diuresis becomes inadequate or if he remains grossly volume overloaded. - IV Lasix 80mg  twice daily - Metolazone 12.5 mg, acetazolamide 125 mg twice daily, spironolactone 25 mg twice daily - Potassium 40 mg twice daily - Strict I/O's and daily weights - Continue Unna Boot for right leg - Cardiac monitoring - Daily BMP - Outpatient vascular follow-up for moderate PAD of the left lower extremity   #H/o HCC s/p liver transplant Patient states that he underwent liver transplant 9 years ago.  Notes there was no clear source of his cirrhosis.  He is now status post 3 paracenteses total, including most recently paracentesis yesterday by IR.  SAAG of 1.2 concerning for portal hypertension. RUQ ultrasound did not show ascites. His liver enzymes have remained stable.  PT/INR WNL on admission.  His ascites and edema seem to be more  likely related to his heart failure rather than underlying liver dysfunction. - Continue mycophenolate 1000mg  PO twice daily  #Hyponatremia Sodium of 134, improved compared to yesterday at 130. -Monitor BMP  #Anemia Received IV iron. Hemoglobin remained stable at 10.9. -Monitor CBC   #Atrial tachycardia Patient has no history of arrhythmia but did have severe dilatation  of the left atrial on echo performed on 10/10. Will continue to monitor. - Cardiac monitoring  #Right Great Toe Wound Patient had nail loss of the right great toe with excoriation over the nail area because he stumped his toe and tore his nail off.  -Continue wound care   #T2DM Patient is on 20-30 units of Humalog with meals, no basal dose. Most recent A1c of 6.0 in April 2022. CBGs well controlled this morning. - NovoLog 10 units 3 times daily with meals - SSI, moderate - CBG 4x daily   #CKD stage IIIb Creatinine of 2.44 today, up from 2.27 yesterday though creatinine has been around 2.38-2.4 for the majority of his hospitalization here.  Baseline creatinine appears to be 2.0-2.2 range. - Trend BMP - Avoid nephrotoxic meds, including ACE/ARB   #HTN #HLD -Hydralazine 25 mg mornings, 12.5 mg evenings - Gemfibrozil 300mg  PO twice daily   #Gout - Continue allopurinol 150mg  PO daily     Best Practice Diet: Salt restricted diet, 1.8 L fluid restriction IVF: None VTE: Enoxaparin Code: Full   Prior to Admission Living Arrangement: Home Anticipated Discharge Location: Home Barriers to Discharge: continue diuresis Dispo: Anticipated discharge pending medical management of heart failure exacerbation   LOS: 9 days   [name], MD 12/01/2020, 1:43 PM

## 2020-12-02 DIAGNOSIS — I5033 Acute on chronic diastolic (congestive) heart failure: Secondary | ICD-10-CM | POA: Diagnosis not present

## 2020-12-02 LAB — PATHOLOGIST SMEAR REVIEW

## 2020-12-02 LAB — GLUCOSE, CAPILLARY
Glucose-Capillary: 122 mg/dL — ABNORMAL HIGH (ref 70–99)
Glucose-Capillary: 118 mg/dL — ABNORMAL HIGH (ref 70–99)
Glucose-Capillary: 113 mg/dL — ABNORMAL HIGH (ref 70–99)
Glucose-Capillary: 116 mg/dL — ABNORMAL HIGH (ref 70–99)

## 2020-12-02 LAB — BASIC METABOLIC PANEL
Anion gap: 16 — ABNORMAL HIGH (ref 5–15)
BUN: 114 mg/dL — ABNORMAL HIGH (ref 8–23)
CO2: 26 mmol/L (ref 22–32)
Calcium: 9.5 mg/dL (ref 8.9–10.3)
Chloride: 89 mmol/L — ABNORMAL LOW (ref 98–111)
Creatinine, Ser: 2.48 mg/dL — ABNORMAL HIGH (ref 0.61–1.24)
GFR, Estimated: 28 mL/min — ABNORMAL LOW (ref >60)
Glucose, Bld: 126 mg/dL — ABNORMAL HIGH (ref 70–99)
Potassium: 4.6 mmol/L (ref 3.5–5.1)
Sodium: 131 mmol/L — ABNORMAL LOW (ref 135–145)

## 2020-12-02 LAB — CBC
HCT: 33.5 % — ABNORMAL LOW (ref 39.0–52.0)
Hemoglobin: 10.6 g/dL — ABNORMAL LOW (ref 13.0–17.0)
MCH: 29.9 pg (ref 26.0–34.0)
MCHC: 31.6 g/dL (ref 30.0–36.0)
MCV: 94.4 fL (ref 80.0–100.0)
Platelets: 122 10*3/uL — ABNORMAL LOW (ref 150–400)
RBC: 3.55 MIL/uL — ABNORMAL LOW (ref 4.22–5.81)
RDW: 17.3 % — ABNORMAL HIGH (ref 11.5–15.5)
WBC: 6.6 10*3/uL (ref 4.0–10.5)
nRBC: 0 % (ref 0.0–0.2)

## 2020-12-02 MED ORDER — BENZONATATE 100 MG PO CAPS
100.0000 mg | ORAL_CAPSULE | Freq: Three times a day (TID) | ORAL | Status: DC | PRN
  Administered 2020-12-02 – 2020-12-03 (×4): 100 mg via ORAL
  Filled 2020-12-02 (×4): qty 1

## 2020-12-02 MED ORDER — POTASSIUM CHLORIDE CRYS ER 20 MEQ PO TBCR
40.0000 meq | EXTENDED_RELEASE_TABLET | Freq: Every day | ORAL | Status: DC
  Administered 2020-12-03: 40 meq via ORAL
  Filled 2020-12-02: qty 2

## 2020-12-02 MED ORDER — BUMETANIDE 2 MG PO TABS
3.0000 mg | ORAL_TABLET | Freq: Every evening | ORAL | Status: DC
  Administered 2020-12-02: 3 mg via ORAL
  Filled 2020-12-02 (×2): qty 1

## 2020-12-02 MED ORDER — GUAIFENESIN-CODEINE 100-10 MG/5ML PO SOLN
5.0000 mL | ORAL | Status: DC | PRN
  Administered 2020-12-02: 5 mL via ORAL
  Filled 2020-12-02: qty 5

## 2020-12-02 MED ORDER — METOLAZONE 5 MG PO TABS: 5.0000 mg | ORAL_TABLET | ORAL | Status: DC

## 2020-12-02 MED ORDER — BUMETANIDE 2 MG PO TABS
3.0000 mg | ORAL_TABLET | Freq: Every morning | ORAL | Status: DC
  Administered 2020-12-03: 3 mg via ORAL
  Filled 2020-12-02: qty 1

## 2020-12-02 MED ORDER — GUAIFENESIN-DM 100-10 MG/5ML PO SYRP: 5.0000 mL | ORAL_SOLUTION | ORAL | Status: DC | PRN

## 2020-12-02 NOTE — Discharge Instructions (Addendum)
Low Sodium Nutrition Therapy  Eating less sodium can help you if you have high blood pressure, heart failure, or kidney or liver disease.   Your body needs a little sodium, but too much sodium can cause your body to hold onto extra water. This extra water will raise your blood pressure and can cause damage to your heart, kidneys, or liver as they are forced to work harder.   Sometimes you can see how the extra fluid affects you because your hands, legs, or belly swell. You may also hold water around your heart and lungs, which makes it hard to breathe.   Even if you take medication for blood pressure or a water pill (diuretic) to remove fluid, it is still important to have less salt in your diet.   Check with your primary care provider before drinking alcohol since it may affect the amount of fluid in your body and how your heart, kidneys, or liver work. Sodium in Food A low-sodium meal plan limits the sodium that you get from food and beverages to 1,500-2,000 milligrams (mg) per day. Salt is the main source of sodium. Read the nutrition label on the package to find out how much sodium is in one serving of a food.  Select foods with 140 milligrams (mg) of sodium or less per serving.  You may be able to eat one or two servings of foods with a little more than 140 milligrams (mg) of sodium if you are closely watching how much sodium you eat in a day.  Check the serving size on the label. The amount of sodium listed on the label shows the amount in one serving of the food. So, if you eat more than one serving, you will get more sodium than the amount listed.  Tips Cutting Back on Sodium Eat more fresh foods.  Fresh fruits and vegetables are low in sodium, as well as frozen vegetables and fruits that have no added juices or sauces.  Fresh meats are lower in sodium than processed meats, such as bacon, sausage, and hotdogs.  Not all processed foods are unhealthy, but some processed foods may have too  much sodium.  Eat less salt at the table and when cooking. One of the ingredients in salt is sodium.  One teaspoon of table salt has 2,300 milligrams of sodium.  Leave the salt out of recipes for pasta, casseroles, and soups. Be a smart shopper.  Food packages that say "Salt-free", sodium-free", "very low sodium," and "low sodium" have less than 140 milligrams of sodium per serving.  Beware of products identified as "Unsalted," "No Salt Added," "Reduced Sodium," or "Lower Sodium." These items may still be high in sodium. You should always check the nutrition label. Add flavors to your food without adding sodium.  Try lemon juice, lime juice, or vinegar.  Dry or fresh herbs add flavor.  Buy a sodium-free seasoning blend or make your own at home. You can purchase salt-free or sodium-free condiments like barbeque sauce in stores and online. Ask your registered dietitian nutritionist for recommendations and where to find them.   Eating in Restaurants Choose foods carefully when you eat outside your home. Restaurant foods can be very high in sodium. Many restaurants provide nutrition facts on their menus or their websites. If you cannot find that information, ask your server. Let your server know that you want your food to be cooked without salt and that you would like your salad dressing and sauces to be served on the   side.    Foods Recommended Food Group Foods Recommended  Grains Bread, bagels, rolls without salted tops Homemade bread made with reduced-sodium baking powder Cold cereals, especially shredded wheat and puffed rice Oats, grits, or cream of wheat Pastas, quinoa, and rice Popcorn, pretzels or crackers without salt Corn tortillas  Protein Foods Fresh meats and fish; turkey bacon (check the nutrition labels - make sure they are not packaged in a sodium solution) Canned or packed tuna (no more than 4 ounces at 1 serving) Beans and peas Soybeans) and tofu Eggs Nuts or nut butters  without salt  Dairy Milk or milk powder Plant milks, such as rice and soy Yogurt, including Greek yogurt Small amounts of natural cheese (blocks of cheese) or reduced-sodium cheese can be used in moderation. (Swiss, ricotta, and fresh mozzarella cheese are lower in sodium than the others) Cream Cheese Low sodium cottage cheese  Vegetables Fresh and frozen vegetables without added sauces or salt Homemade soups (without salt) Low-sodium, salt-free or sodium-free canned vegetables and soups  Fruit Fresh and canned fruits Dried fruits, such as raisins, cranberries, and prunes  Oils Tub or liquid margarine, regular or without salt Canola, corn, peanut, olive, safflower, or sunflower oils  Condiments Fresh or dried herbs such as basil, bay leaf, dill, mustard (dry), nutmeg, paprika, parsley, rosemary, sage, or thyme.  Low sodium ketchup Vinegar  Lemon or lime juice Pepper, red pepper flakes, and cayenne. Hot sauce contains sodium, but if you use just a drop or two, it will not add up to much.  Salt-free or sodium-free seasoning mixes and marinades Simple salad dressings: vinegar and oil   Foods Not Recommended Food Group Foods Not Recommended  Grains Breads or crackers topped with salt Cereals (hot/cold) with more than 300 mg sodium per serving Biscuits, cornbread, and other "quick" breads prepared with baking soda Pre-packaged bread crumbs Seasoned and packaged rice and pasta mixes Self-rising flours  Protein Foods Cured meats: Bacon, ham, sausage, pepperoni and hot dogs Canned meats (chili, vienna sausage, or sardines) Smoked fish and meats Frozen meals that have more than 600 mg of sodium per serving Egg substitute (with added sodium)  Dairy Buttermilk Processed cheese spreads Cottage cheese (1 cup may have over 500 mg of sodium; look for low-sodium.) American or feta cheese Shredded Cheese has more sodium than blocks of cheese String cheese  Vegetables Canned vegetables  (unless they are salt-free, sodium-free or low sodium) Frozen vegetables with seasoning and sauces Sauerkraut and pickled vegetables Canned or dried soups (unless they are salt-free, sodium-free, or low sodium) French fries and onion rings  Fruit Dried fruits preserved with additives that have sodium  Oils Salted butter or margarine, all types of olives  Condiments Salt, sea salt, kosher salt, onion salt, and garlic salt Seasoning mixes with salt Bouillon cubes Ketchup Barbeque sauce and Worcestershire sauce unless low sodium Soy sauce Salsa, pickles, olives, relish Salad dressings: ranch, blue cheese, Italian, and French.   Low Sodium Sample 1-Day Menu  Breakfast 1 cup cooked oatmeal  1 slice whole wheat bread toast  1 tablespoon peanut butter without salt  1 banana  1 cup 1% milk  Lunch Tacos made with: 2 corn tortillas   cup black beans, low sodium   cup roasted or grilled chicken (without skin)   avocado  Squeeze of lime juice  1 cup salad greens  1 tablespoon low-sodium salad dressing   cup strawberries  1 orange  Afternoon Snack 1/3 cup grapes  6 ounces yogurt    Evening Meal 3 ounces herb-baked fish  1 baked potato  2 teaspoons olive oil   cup cooked carrots  2 thick slices tomatoes on:  2 lettuce leaves  1 teaspoon olive oil  1 teaspoon balsamic vinegar  1 cup 1% milk  Evening Snack 1 apple   cup almonds without salt   Low-Sodium Vegetarian (Lacto-Ovo) Sample 1-Day Menu  Breakfast 1 cup cooked oatmeal  1 slice whole wheat toast  1 tablespoon peanut butter without salt  1 banana  1 cup 1% milk  Lunch Tacos made with: 2 corn tortillas   cup black beans, low sodium   cup roasted or grilled chicken (without skin)   avocado  Squeeze of lime juice  1 cup salad greens  1 tablespoon low-sodium salad dressing   cup strawberries  1 orange  Evening Meal Stir fry made with:  cup tofu  1 cup brown rice   cup broccoli   cup green beans   cup  peppers   tablespoon peanut oil  1 orange  1 cup 1% milk  Evening Snack 4 strips celery  2 tablespoons hummus  1 hard-boiled egg   Low-Sodium Vegan Sample 1-Day Menu  Breakfast 1 cup cooked oatmeal  1 tablespoon peanut butter without salt  1 cup blueberries  1 cup soymilk fortified with calcium, vitamin B12, and vitamin D  Lunch 1 small whole wheat pita   cup cooked lentils  2 tablespoons hummus  4 carrot sticks  1 medium apple  1 cup soymilk fortified with calcium, vitamin B12, and vitamin D  Evening Meal Stir fry made with:  cup tofu  1 cup brown rice   cup broccoli   cup green beans   cup peppers   tablespoon peanut oil  1 cup cantaloupe  Evening Snack 1 cup soy yogurt   cup mixed nuts  Copyright 2020  Academy of Nutrition and Dietetics. All rights reserved  Sodium Free Flavoring Tips  When cooking, the following items may be used for flavoring instead of salt or seasonings that contain sodium. Remember: A little bit of spice goes a long way! Be careful not to overseason. Spice Blend Recipe (makes about ? cup) 5 teaspoons onion powder  2 teaspoons garlic powder  2 teaspoons paprika  2 teaspoon dry mustard  1 teaspoon crushed thyme leaves   teaspoon white pepper   teaspoon celery seed Food Item Flavorings  Beef Basil, bay leaf, caraway, curry, dill, dry mustard, garlic, grape jelly, green pepper, mace, marjoram, mushrooms (fresh), nutmeg, onion or onion powder, parsley, pepper, rosemary, sage  Chicken Basil, cloves, cranberries, mace, mushrooms (fresh), nutmeg, oregano, paprika, parsley, pineapple, saffron, sage, savory, tarragon, thyme, tomato, turmeric  Egg Chervil, curry, dill, dry mustard, garlic or garlic powder, green pepper, jelly, mushrooms (fresh), nutmeg, onion powder, paprika, parsley, rosemary, tarragon, tomato  Fish Basil, bay leaf, chervil, curry, dill, dry mustard, green pepper, lemon juice, marjoram, mushrooms (fresh), paprika, pepper,  tarragon, tomato, turmeric  Lamb Cloves, curry, dill, garlic or garlic powder, mace, mint, mint jelly, onion, oregano, parsley, pineapple, rosemary, tarragon, thyme  Pork Applesauce, basil, caraway, chives, cloves, garlic or garlic powder, onion or onion powder, rosemary, thyme  Veal Apricots, basil, bay leaf, currant jelly, curry, ginger, marjoram, mushrooms (fresh), oregano, paprika  Vegetables Basil, dill, garlic or garlic powder, ginger, lemon juice, mace, marjoram, nutmeg, onion or onion powder, tarragon, tomato, sugar or sugar substitute, salt-free salad dressing, vinegar  Desserts Allspice, anise, cinnamon, cloves, ginger, mace, nutmeg, vanilla extract, other   extracts   Copyright 2020  Academy of [employer] and Dietetics. All rights reserved  Fluid Restricted Nutrition Therapy  You have been prescribed this diet because your condition affects how much fluid you can eat or drink. If your heart, liver, or kidneys aren't working properly, you may not be able to effectively eliminate fluids from the body and this may cause swelling (edema) in the legs, arms, and/or stomach. Drink no more than _________ liters or ________ ounces or ________cups of fluid per day.  You don't need to stop eating or drinking the same fluids you normally would, but you may need to eat or drink less than usual.  Your registered dietitian nutritionist will help you determine the correct amount of fluid to consume during the day Breakfast Include fluids taken with medications  Lunch Include fluids taken with medications  Dinner Include fluids taken with medications  Bedtime Snack Include fluids taken with medications     Tips What Are Fluids?  A fluid is anything that is liquid or anything that would melt if left at room temperature. You will need to count these foods and liquids--including any liquid used to take medication--as part of your daily fluid intake. Some examples are: Alcohol (drink only with your  doctor's permission)  Coffee, tea, and other hot beverages  Gelatin (Jell-O)  Gravy  Ice cream, sherbet, sorbet  Ice cubes, ice chips  Milk, liquid creamer  Nutritional supplements  Popsicles  Vegetable and fruit juices; fluid in canned fruit  Watermelon  Yogurt  Soft drinks, lemonade, limeade  Soups  Syrup How Do I Measure My Fluid Intake? Record your fluid intake daily.  Tip: Every day, each time you eat or drink fluids, pour water in the same amount into an empty container that can hold the same amount of fluids you are allowed daily. This may help you keep track of how much fluid you are taking in throughout the day.  To accurately keep track of how much liquid you take in, measure the size of the cups, glasses, and bowls you use. If you eat soup, measure how much of it is liquid and how much is solid (such as noodles, vegetables, meat). Conversions for Measuring Fluid Intake  Milliliters (mL) Liters (L) Ounces (oz) Cups (c)  1000 1 32 4  1200 1.2 40 5  1500 1.5 50 6 1/4  1800 1.8 60 7 1/2  2000 2 67 8 1/3  Tips to Reduce Your Thirst Chew gum or suck on hard candy.  Rinse or gargle with mouthwash. Do not swallow.  Ice chips or popsicles my help quench thirst, but this too needs to be calculated into the total restriction. Melt ice chips or cubes first to figure out how much fluid they produce (for example, experiment with melting  cup ice chips or 2 ice cubes).  Add a lemon wedge to your water.  Limit how much salt you take in. A high salt intake might make you thirstier.  Don't eat or drink all your allowed liquids at once. Space your liquids out through the day.  Use small glasses and cups and sip slowly. If allowed, take your medications with fluids you eat or drink during a meal.   Fluid-Restricted Nutrition Therapy Sample 1-Day Menu  Breakfast 1 slice wheat toast  1 tablespoon peanut butter  1/2 cup yogurt (120 milliliters)  1/2 cup blueberries  1 cup milk (240  milliliters)   Lunch 3 ounces sliced turkey  2 slices whole wheat   bread  1/2 cup lettuce for sandwich  2 slices tomato for sandwich  1 ounce reduced-fat, reduced-sodium cheese  1/2 cup fresh carrot sticks  1 banana  1 cup unsweetened tea (240 milliliters)   Evening Meal 8 ounces soup (240 milliliters)  3 ounces salmon  1/2 cup quinoa  1 cup green beans  1 cup mixed greens salad  1 tablespoon olive oil  1 cup coffee (240 milliliters)  Evening Snack 1/2 cup sliced peaches  1/2 cup frozen yogurt (120 milliliters)  1 cup water (240 milliliters)  Copyright 2020  Academy of Nutrition and Dietetics. All rights reserved     Internal medicine doctors discharge instructions:  You were admitted to the hospital for an acute worsening of your heart failure.  We put you on 4 different water pills, and you have a documented urine output of 22 L since being admitted to the hospital.  Your current weight is 86.3 kg, and we reviewed with you yesterday that this is around your dry weight.  We have transitioned you back to your home medications for your heart failure.  Please follow-up with your primary care provider early next week to discuss your recent hospitalization.  At that time, they can also change your Unna boot for you. Please also contact your cardiologist for a follow-up to discuss your hospitalization here. For your cough, I have prescribed a cough medicine for you, take this as needed.  I have also prescribed you azithromycin AKA Zithromax.  Directions to take: Please take 500 mg (2 tablets) today, then take 250 mg (1 tablet) once a day on days 2 through 5.

## 2020-12-02 NOTE — Plan of Care (Signed)
Nutrition Education Note  RD consulted for nutrition education regarding CHF.  Spoke with pt and wife at bedside. Pt reports good appetite and shares he does a lot of cooking a home (Breakfast: oatmeal, eggs, and fruit; Lunch: hamburger; Dinner: homemade soup). Pt enjoys finding recipes online and modifying ingredients to provide less sodium.   Pt and wife keep very detailed records of CBGS, weights, and BP readings. Pt reports that his weight started "creeping up" a few weeks ago. Noted UBW is around 194-197#. Discussed when to call MD to discuss weight changes (3# wt gain per day or 5# wt gain per week). Pt expressed understanding and states "my doctor already yelled at me about that".  RD provided "Low Sodium Nutrition Therapy" handout from the Academy of [employer] and [employer]etics. Reviewed patient's dietary recall. Provided examples on ways to decrease sodium intake in diet. Discouraged intake of processed foods and use of salt shaker. Encouraged fresh fruits and vegetables as well as whole grain sources of carbohydrates to maximize fiber intake.   RD discussed why it is important for patient to adhere to diet recommendations, and emphasized the role of fluids, foods to avoid, and importance of weighing self daily. Teach back method used.  Expect good compliance.  Current diet order is 2 gram sodium with 1.8 L fluid restriction, patient is consuming approximately 75-100% of meals at this time. Labs and medications reviewed. No further nutrition interventions warranted at this time. RD contact information provided. If additional nutrition issues arise, please re-consult RD.   Gregory Tanner, RD, LDN, [place] Registered Dietitian II Certified Diabetes Care and Education Specialist Please refer to [facility] for RD and/or RD on-call/weekend/after hours pager

## 2020-12-02 NOTE — Progress Notes (Signed)
Subjective: 65 year old male with past medical history of chronic diastolic congestive heart failure, CKD stage IIIb, history of hepatocellular carcinoma with liver transplant on chronic immunosuppressive therapy, type 2 diabetes BPH and hypertension here for HFpEF exacerbation.  Overnight, there were no acute events.  Patient was seen at bedside during rounds this morning.  He continues to endorse dry cough which was not alleviated by Mucinex tablet.  This is his main concern today.  Patient documented his daily weights in the past several weeks, which wife showed to [place] in the room.  Appears that his dry weight is at around 191 pounds.  Objective:  Vital signs in last 24 hours: Vitals:   12/01/20 0428 12/01/20 1112 12/01/20 1958 12/02/20 0457  BP: 116/61 (!) 115/52 126/61 (!) 113/57  Pulse: 85 84 83 84  Resp: 18 18 18 20   Temp: 97.8 F (36.6 C) (!) 97.5 F (36.4 C) 97.8 F (36.6 C) 97.9 F (36.6 C)  TempSrc: Oral Oral Oral Oral  SpO2: 99% 99% 97% 98%  Weight: 86.6 kg   86.8 kg  Height:       Weight change: 0.227 kg  Intake/Output Summary (Last 24 hours) at 12/02/2020 4098 Last data filed at 12/02/2020 0500 Gross per 24 hour  Intake 440 ml  Output 2370 ml  Net -1930 ml   Net -19.8 L since admit Weight: 86.8 kg from 86.6 kg from 92.1 kg on admit  Physical Exam Constitutional: Sitting upright in chair appears comfortable in no acute distress HENT: Normocephalic and atraumatic.  Cardiovascular: Regular rate and rhythm. No murmurs/rubs/gallops Pulmonary: Normal pulmonary effort. No wheezing, rales, rhonchi appreciated. Abdominal: Abdominal distention, somewhat softer compared to exam prior, Normal bowel sounds. No tenderness to palpation. Musculoskeletal: 1+ edema of the RLE in unna boot, 2+ of LLE Skin: Warm and dry. Dark red/purple left shin skin discoloration. There is dark purple ecchymosis over the RLQ and LLQ.  Paracentesis site with overlying gauze appears  clean/dry/intact. Unna boot on right leg. Neurological: Aox3, moving all extermities, no focal deficit appreciated Psychiatric: Mood and behavior normal.  Assessment/Plan:  Principal Problem:   Acute exacerbation of CHF (congestive heart failure) (HCC) Active Problems:   Type 2 diabetes mellitus ([place])   History of liver transplant ([place])   Thrombocytopenia (HCC)   Iron deficiency anemia   Ascites  Gregory Tanner is a 65 year old male with a past medical history of HFpEF, CKD Stage IIIB, HCC s/p liver transplant, Type II Diabetes Mellitus, BPH, and HTN who presented with fluid retention and was admitted for HFpEF exacerbation.   #HFpEF exacerbation Patient had an output of 2.3 L since yesterday.  He is net -20 L since admit. His weight today is 86.6 kg from 88.9 kg yesterday, compared to 92.1 kg on admit.  His baseline creatinine is around 2-2.2.  Yesterday, patient had mild bump in his creatinine to 2.44, creatinine is down to 2.48 today.  Lower extremity edema and abdominal distention are improving with current regimen, however we will switch to his oral diuretic regimen given that he now seems to be around his dry weight.  -P.o. Bumex, 3 mg in the morning, 2 mg at night. -P.o. metolazone 5 mg every Monday, Wednesday, Friday, Saturday -Spironolactone 25 mg p.o. twice daily - Potassium 40 mg twice daily - Strict I/O's and daily weights - Continue Unna Boot for right leg - Cardiac monitoring - Daily BMP - Outpatient vascular follow-up for moderate PAD of the left lower extremity   #H/o [facility]  s/p liver transplant Patient states that he underwent liver transplant 9 years ago.  Notes there was no clear source of his cirrhosis.  He is now status post 3 paracenteses total, including most recently paracentesis on 10/17 by IR.  SAAG of 1.2 concerning for portal hypertension. RUQ ultrasound did not show ascites. His liver enzymes have remained stable.  PT/INR WNL on admission.  His ascites and  edema seem to be more likely related to his heart failure rather than underlying liver dysfunction. - Continue mycophenolate 1000mg  PO twice daily  #Hyponatremia Sodium is stable at 131. -Monitor BMP  #Anemia Received IV iron. Hemoglobin remained stable at 10.6. -Monitor CBC   #Atrial tachycardia Patient has no history of arrhythmia but did have severe dilatation of the left atrial on echo performed on 10/10. Will continue to monitor. - Cardiac monitoring  #Right Great Toe Wound Patient had nail loss of the right great toe with excoriation over the nail area because he stumped his toe and tore his nail off.  -Continue wound care   #T2DM Patient is on 20-30 units of Humalog with meals, no basal dose. Most recent A1c of 6.0 in April 2022. CBGs are well controlled this morning. - NovoLog 10 units 3 times daily with meals - SSI, moderate - CBG 4x daily   #CKD stage IIIb Creatinine of 2.48 today, up from 2.42 yesterday. Baseline creatinine appears to be 2.0-2.2 range. - Trend BMP - Avoid nephrotoxic meds, including ACE/ARB   #HTN #HLD -Hydralazine 25 mg mornings, 12.5 mg evenings - Gemfibrozil 300mg  PO twice daily   #Gout - Continue allopurinol 150mg  PO daily   Best Practice Diet: Salt restricted diet, 1.8 L fluid restriction IVF: None VTE: Enoxaparin Code: Full   Prior to Admission Living Arrangement: Home Anticipated Discharge Location: Home Barriers to Discharge: continue diuresis Dispo: Anticipated discharge pending medical management of heart failure exacerbation   LOS: 10 days   [name], MD 12/02/2020, 6:05 AM

## 2020-12-03 ENCOUNTER — Inpatient Hospital Stay (HOSPITAL_COMMUNITY): Payer: Medicare Other

## 2020-12-03 DIAGNOSIS — I5033 Acute on chronic diastolic (congestive) heart failure: Secondary | ICD-10-CM | POA: Diagnosis not present

## 2020-12-03 LAB — BASIC METABOLIC PANEL
Anion gap: 17 — ABNORMAL HIGH (ref 5–15)
BUN: 118 mg/dL — ABNORMAL HIGH (ref 8–23)
CO2: 24 mmol/L (ref 22–32)
Calcium: 9.6 mg/dL (ref 8.9–10.3)
Chloride: 90 mmol/L — ABNORMAL LOW (ref 98–111)
Creatinine, Ser: 2.59 mg/dL — ABNORMAL HIGH (ref 0.61–1.24)
GFR, Estimated: 26 mL/min — ABNORMAL LOW (ref >60)
Glucose, Bld: 121 mg/dL — ABNORMAL HIGH (ref 70–99)
Potassium: 3.9 mmol/L (ref 3.5–5.1)
Sodium: 131 mmol/L — ABNORMAL LOW (ref 135–145)

## 2020-12-03 LAB — CBC
HCT: 32.3 % — ABNORMAL LOW (ref 39.0–52.0)
Hemoglobin: 10.4 g/dL — ABNORMAL LOW (ref 13.0–17.0)
MCH: 30 pg (ref 26.0–34.0)
MCHC: 32.2 g/dL (ref 30.0–36.0)
MCV: 93.1 fL (ref 80.0–100.0)
Platelets: 130 10*3/uL — ABNORMAL LOW (ref 150–400)
RBC: 3.47 MIL/uL — ABNORMAL LOW (ref 4.22–5.81)
RDW: 17.2 % — ABNORMAL HIGH (ref 11.5–15.5)
WBC: 6.2 10*3/uL (ref 4.0–10.5)
nRBC: 0 % (ref 0.0–0.2)

## 2020-12-03 LAB — GLUCOSE, CAPILLARY
Glucose-Capillary: 137 mg/dL — ABNORMAL HIGH (ref 70–99)
Glucose-Capillary: 96 mg/dL (ref 70–99)

## 2020-12-03 MED ORDER — MYCOPHENOLATE MOFETIL 250 MG PO CAPS: 1000.0000 mg | ORAL_CAPSULE | Freq: Two times a day (BID) | ORAL | 0 refills | Status: DC

## 2020-12-03 MED ORDER — SPIRONOLACTONE 25 MG PO TABS: 25.0000 mg | ORAL_TABLET | Freq: Two times a day (BID) | ORAL | 0 refills | Status: DC

## 2020-12-03 MED ORDER — BUMETANIDE 2 MG PO TABS: 3.0000 mg | ORAL_TABLET | ORAL | 0 refills | Status: DC

## 2020-12-03 MED ORDER — METOLAZONE 5 MG PO TABS: 5.0000 mg | ORAL_TABLET | ORAL | 0 refills | Status: DC

## 2020-12-03 MED ORDER — AZITHROMYCIN 250 MG PO TABS: ORAL_TABLET | ORAL | 0 refills | Status: DC

## 2020-12-03 MED ORDER — DM-GUAIFENESIN ER 30-600 MG PO TB12: 1.0000 | ORAL_TABLET | Freq: Two times a day (BID) | ORAL | 0 refills | Status: DC | PRN

## 2020-12-03 MED ORDER — GEMFIBROZIL 600 MG PO TABS: 300.0000 mg | ORAL_TABLET | Freq: Two times a day (BID) | ORAL | 0 refills | Status: DC

## 2020-12-03 MED ORDER — POTASSIUM CHLORIDE CRYS ER 20 MEQ PO TBCR: 40.0000 meq | EXTENDED_RELEASE_TABLET | Freq: Two times a day (BID) | ORAL | 0 refills | Status: DC

## 2020-12-03 MED ORDER — RIVAROXABAN 10 MG PO TABS
10.0000 mg | ORAL_TABLET | Freq: Every day | ORAL | Status: DC
  Filled 2020-12-03: qty 1

## 2020-12-03 MED ORDER — HYDRALAZINE HCL 25 MG PO TABS: 12.5000 mg | ORAL_TABLET | ORAL | 0 refills | Status: DC

## 2020-12-03 NOTE — TOC Transition Note (Signed)
Transition of Care [facility]) - CM/SW Discharge Note   Patient Details  Name: Gregory Tanner MRN: [id number] Date of Birth: 1955/05/28  Transition of Care [facility]) CM/SW Contact:  Zenon Mayo, RN Phone Number: 12/03/2020, 1:47 PM   Clinical Narrative:    Patient is from home with wife, she does his dressing changes, he was going to wound clinic but now wife is doing the dressing fine.  She will transport him home. He states he does not need HH or DME.    Final next level of care: Home/Self Care Barriers to Discharge: No Barriers Identified   Patient Goals and CMS Choice Patient states their goals for this hospitalization and ongoing recovery are:: return home with wife   Choice offered to / list presented to : NA  Discharge Placement                       Discharge Plan and Services In-house Referral: NA Discharge Planning Services: CM Consult Post Acute Care Choice: NA            DME Agency: NA       HH Arranged: NA          Social Determinants of Health (SDOH) Interventions     Readmission Risk Interventions Readmission Risk Prevention Plan 12/03/2020  Transportation Screening Complete  PCP or Specialist Appt within 3-5 Days Complete  HRI or Home Care Consult Complete  Social Work Consult for [place] Planning/Counseling Complete  Palliative Care Screening Not Applicable  Medication Review Press photographer) Complete  Some recent data might be hidden

## 2020-12-03 NOTE — Plan of Care (Signed)

## 2020-12-03 NOTE — TOC Initial Note (Signed)
Transition of Care [facility]) - Initial/Assessment Note    Patient Details  Name: Gregory Tanner MRN: [id number] Date of Birth: Apr 07, 1955  Transition of Care [facility]) CM/SW Contact:    Zenon Mayo, RN Phone Number: 12/03/2020, 1:45 PM  Clinical Narrative:                 Patient is from home with wife, she does his dressing changes, he was going to wound clinic but now wife is doing the dressing fine.  She will transport him home. He states he does not need HH or DME.   Expected Discharge Plan: Home/Self Care Barriers to Discharge: No Barriers Identified   Patient Goals and CMS Choice Patient states their goals for this hospitalization and ongoing recovery are:: return home with wife   Choice offered to / list presented to : NA  Expected Discharge Plan and Services Expected Discharge Plan: Home/Self Care In-house Referral: NA Discharge Planning Services: CM Consult Post Acute Care Choice: NA Living arrangements for the past 2 months: Single Family Home Expected Discharge Date: 12/03/20                 DME Agency: NA       HH Arranged: NA          Prior Living Arrangements/Services Living arrangements for the past 2 months: Single Family Home Lives with:: Spouse Patient language and need for interpreter reviewed:: Yes Do you feel safe going back to the place where you live?: Yes      Need for Family Participation in Patient Care: Yes (Comment) Care giver support system in place?: Yes (comment)   Criminal Activity/Legal Involvement Pertinent to Current Situation/Hospitalization: No - Comment as needed  Activities of Daily Living      Permission Sought/Granted                  Emotional Assessment Appearance:: Appears stated age Attitude/Demeanor/Rapport: Engaged Affect (typically observed): Appropriate Orientation: : Oriented to Place, Oriented to  Time, Oriented to Situation, Oriented to Self Alcohol / Substance Use: Not Applicable Psych  Involvement: No (comment)  Admission diagnosis:  Shortness of breath [R06.02] Acute on chronic diastolic heart failure (HCC) [I50.33] Other ascites [R18.8] Acute exacerbation of CHF (congestive heart failure) ([place]) [I50.9] Patient Active Problem List   Diagnosis Date Noted   Ascites    Thrombocytopenia ([place]) 11/23/2020   Iron deficiency anemia 11/23/2020   Acute exacerbation of CHF (congestive heart failure) ([place]) 11/22/2020   Volume overload 01/22/2020   PHT (pulmonary hypertension) ([place]) 01/19/2020   Swelling    Liver disease    Hypertension    Hearing loss    Diabetes mellitus without complication ([place])    Hypokalemia    Heart failure, diastolic, acute on chronic ([place]) 07/24/2019   Uncontrolled type 2 diabetes mellitus with insulin therapy 06/21/2018   CKD (chronic kidney disease) stage 3, GFR 30-59 ml/min ([place]) 03/27/2017   Chronic diastolic heart failure ([place]) 09/26/2016   Chronic pain disorder 10/28/2015   SOB (shortness of breath) on exertion 10/28/2015   Hypertensive heart disease with heart failure ([place]) 10/26/2015   Benign essential hypertension 10/26/2015   Chronic pain associated with significant psychosocial dysfunction 06/17/2015   Nerve root pain 06/17/2015   Ankylosis of lumbar spine 02/21/2014   Degeneration of intervertebral disc of lumbar region 07/05/2013   Lumbar radiculopathy 10/10/2012   Degenerative disc disease, lumbar 07/26/2012   Encounter for other specified prophylactic measures 10/10/2011   Excess weight 10/10/2011  History of liver transplant ([place]) 07/20/2011   Type 2 diabetes mellitus ([place]) 12/27/2010   Hepatocellular carcinoma ([place]) 12/27/2010   BP (high blood pressure) 12/27/2010   PCP:  Ronita Hipps, MD Pharmacy:   [place], [address] [place] [address] Phone: [phone] Fax: [phone]     Social Determinants of Health (SDOH) Interventions    Readmission Risk  Interventions Readmission Risk Prevention Plan 12/03/2020  Transportation Screening Complete  PCP or Specialist Appt within 3-5 Days Complete  HRI or [place] Complete  Social Work Consult for [place] Planning/Counseling Complete  Palliative Care Screening Not Applicable  Medication Review Press photographer) Complete  Some recent data might be hidden

## 2020-12-03 NOTE — Discharge Summary (Signed)
Name: Gregory Tanner MRN: [id number] DOB: [birth year] 65 y.o. PCP: Gregory Hipps, MD  Date of Admission: 11/22/2020  5:58 AM Date of Discharge: 12/03/2020 Attending Physician: Gregory Falcon, MD  Discharge Diagnosis: 1.  HFpEF exacerbation 2.  Anemia 3.  Hypokalemia 4.  Atrial tachycardia 5. H/o HCC s/p liver transplant 6. Right Great Toe Wound 7. CKD stage IIIb 8. T2DM 9. HTN 10. HLD 11. Gout  Discharge Medications: Allergies as of 12/03/2020       Reactions   Iodinated Diagnostic Agents Hives, Other (See Comments)   Allergy is not to all contrast - but patient is unsure of which particular one his allergy is to. Allergy is not to all contrast - but patient is unsure of which particular one his allergy is to. Contrast dye used before liver transplant cause hives, not sure which dye this was but is able to use others   Jardiance [empagliflozin] Dermatitis   developed blisters with the medication, blisters  stopped after he discontinued taking it. (About 6 months ago /~06/2020)   Latex Other (See Comments)   Skin peeling   Metformin And Related Nausea And Vomiting   Rosiglitazone Nausea And Vomiting   GI effects and abdominal pain        Medication List     STOP taking these medications    ipratropium 0.06 % nasal spray Commonly known as: ATROVENT       TAKE these medications    allopurinol 300 MG tablet Commonly known as: ZYLOPRIM Take 150 mg by mouth daily.   aspirin 81 MG tablet Take 81 mg by mouth daily.   azithromycin 250 MG tablet Commonly known as: Zithromax Please take 2 tablets on day 1, then take 1 tablet once a day on days 2-5.   bumetanide 2 MG tablet Commonly known as: Bumex Take 1.5 tablets (3 mg total) by mouth See admin instructions. Take 1.5 tablets (3 mg total ) by mouth daily in the morning, and 1.5 tablets (3 mg total) by mouth daily in the evening.   Dexcom G6 Receiver Devi Use 1 each as directed   Dexcom G6 Sensor  Misc Use 1 each every 10 (ten) days   Dexcom G6 Transmitter Misc Use 1 each every 3 (three) months   dextromethorphan-guaiFENesin 30-600 MG 12hr tablet Commonly known as: MUCINEX DM Take 1 tablet by mouth 2 (two) times daily as needed for cough.   gemfibrozil 600 MG tablet Commonly known as: LOPID Take 0.5 tablets (300 mg total) by mouth in the morning and at bedtime. What changed: See the new instructions.   hydrALAZINE 25 MG tablet Commonly known as: APRESOLINE Take 0.5-1 tablets (12.5-25 mg total) by mouth See admin instructions. Take 1 tablet (25 mg) in the morning and half a tablet (12.5 mg) before bedtime What changed: See the new instructions.   insulin lispro 100 UNIT/ML KwikPen Commonly known as: HUMALOG Inject 20-30 Units into the skin with breakfast, with lunch, and with evening meal.   metolazone 5 MG tablet Commonly known as: ZAROXOLYN Take 1 tablet (5 mg total) by mouth See admin instructions. Take 1 tablet by mouth on Monday, Wednesday, Friday, and Saturday   multivitamin tablet Take 1 tablet by mouth daily.   mycophenolate 250 MG capsule Commonly known as: CELLCEPT Take 4 capsules (1,000 mg total) by mouth in the morning and at bedtime.   potassium chloride SA 20 MEQ tablet Commonly known as: KLOR-CON Take 2 tablets (40 mEq total) by mouth  2 (two) times daily.   spironolactone 25 MG tablet Commonly known as: ALDACTONE Take 1 tablet (25 mg total) by mouth 2 (two) times daily.   TART CHERRY PO Take by mouth.               Discharge Care Instructions  (From admission, onward)           Start     Ordered   12/03/20 0000  Discharge wound care:       Comments: Clean the right great toe with NS, pat dry then place a small piece of Xeroform gauze Gregory Tanner # [phone]) over the wound and wrap with small Mancel Parsons Gregory Tanner # [phone]). Change daily.   12/03/20 1337            Disposition and follow-up:   Mr.Gregory Tanner was discharged from [facility] in Good condition.  At the hospital follow up visit please address:  1.  HFpEF exacerbation: Patient was discharged at around his dry weight and on his oral diuretic regimen.  He did complain of persistent cough during the last few days of his hospitalization.  He was discharged on a cough medicine and on azithromycin, to take for 5 days: He will take 500 mg on day 1 followed by 250 mg on days 2 through 5.  Follow-up Appointments:   Hospital Course by problem list: Gregory Tanner is a 65 year old male with a past medical history of HFpEF, CKD Stage IIIB, HCC s/p liver transplant, Type II Diabetes Mellitus, BPH, and HTN who presented with fluid retention and was admitted for HFpEF exacerbation.  #HFpEF exacerbation Patient has a history of heart failure, for which he is followed by Dr. Sharol Tanner at [facility]. He endorsed having CHF exacerbations in the past.  He presented to the ED with a 1.5-week history of increased abdominal girth and worsening swelling in the bilateral lower extremities. He has been compliant with his home regimen of Bumex 1.5 mg twice daily, metolazone 5 mg on M/W/F/Sat, and spironolactone 25 mg twice daily. No dietary indiscretion, no recent sick contacts. In the ED, patient was afebrile and HDS. Exam remarkable for JVD, rhonchi on pulmonary exam, distended abdomen, and 2+ bilateral pitting edema. WBC 4.8, Cr 2.25 (up from baseline around 2), BNP 352.  No recent progression of CKD. Chest x-ray revealed cardiomegaly with pulmonary venous congestion and interstitial markings. He was given 21m Lasix in the ED. Also given one dose of metolazone 51m Echo performed 10/10 shows EF 65-75%. He now has grade II diastolic dysfunction, which is new from echo in May 2021. Weight on admit was 92.1 kg, and weight today is 92.4 kg. Patient said he has had continued output, and net output recorded greater than 4L. Continued diuresis with IV lasix 8031mwice daily and continued on  spironolactone 76m93mice daily. Given his significant lower extremity edema, we obtained lower extremity vascular ultrasound on 10/10 to evaluate for DVT. It showed no evidence of DVT and no evidence of cystic structures in the popliteal fossa. Gave another dose of metolazone on 10/11. ABIs were completed as well for placement of right unna boot, and results showed he has no evidence of significant right lower extremity arterial disease but moderate left lower extremity arterial disease with abnormal left toe-brachial index. Increased coughing and crackles still noted in right lung on physical exam on 10/12.  Patient was subsequently placed on acetazolamide 125 mg p.o. twice daily.  The following day on 10/13, the patient's  weight decreased from 92.4kg to 91.9 kg, and net output was recorded at greater than 4 L. Exam that day showed +3 left lower extremity edema, Unna boot on the right side.  However, crackles seemed improved compared to exam prior.  The following day on 10/14, patient had 2.9 L output and weight decreased to 91.3 kg. Clinically, the patient's crackles seem to have improved and right lower leg edema seems to have improved with Unna boot, however LLE still 2+ pitting edema.  During the weekend of 10/15-10/16, patient continued to diurese well, and was down to 88.9 kg by the end of the weekend on same diuretic regimen.  Chest x-ray over the weekend showed Cardiomegaly with pulmonary vascular prominence and mild, diffuse interstitial pulmonary opacity, likely edema.  BNP also improved from 352-222.  On 10/16, IR consult was placed for paracentesis given concern that his ascites and edema could have been related to underlying liver dysfunction.  On 10/17, patient underwent paracentesis by IR, yielded 1.9 L fluid.  SAAG 1.2.  Patient continued to diurese well through the week, and on 10/19 patient was transitioned to his home diuresis regimen since he was at around his dry weight (~191 pounds).  Exam  that day showed 1+ edema of RLE and Unna boot, 2+ of LLE, however no crackles appreciated. Repeat chest x-ray was performed on 10/20 and showed unchanged pulmonary vascular congestion.  He was discharged home on his home oral diuretic regimen.  #Acute bronchitis In the last few days leading up to his discharge today, patient complaining of persistent cough.  Afebrile and WBC WNL.  He states that the sputum is clear in nature. He was discharged on a cough medicine and on azithromycin, to take for 5 days: He will take 500 mg on day 1 followed by 250 mg on days 2 through 5.  #Anemia Hgb noted to be around 14 last year. On admission, patient with Hgb of 10, most recently 9.9 this morning.  No obvious signs of bleeding. Iron studies WNL except for decreased sat ratio of 13%. Ferritin 78. Gave 74m test dose of iron dextran, followed by 10072mdose. Patient tolerated well, and hemoglobin remained stable around 9.9-10.   #History of hypokalemia Patient reports history of low potassium, and he has had difficulty keeping his potassium level above goal of 4. On admission, potassium chloride ordered as 2050mtwice daily, changed to home dose of 40 mEQ twice daily on 10/11. BMP was monitored throughout hospitalization and extra potassium was given as needed.  On 10/19, patient had a potassium of 4.6, which was noted to be slowly uptrending on his home regimen, therefore his regimen was changed to 40 mEQ once daily.  The following day, potassium was noted to be at 3.9.  He was discharged on his home regimen of 40 mEQ twice daily.  #Atrial tachycardia Patient has no history of arrhythmia but had several short bursts of atrial tachycardia in the 140s as he slept in his recliner on 10/12. Note from overnight says bursts were 1 second long every 30 minutes. The changes were noted on telemetry but not long enough to be noted on EKG. He endorsed having a fast heart rate at times last night but he remained hemodynamically  stable and experienced no acute distress. He remained on telemetry and did not have any other events.   #Right Great Toe Wound Patient had nail loss of the right great toe with excoriation over the nail area because he stumped his toe  and tore his nail off. Wound care saw the patient and noted there was scant, serous drainage on the dressing. WOC evaluated the patient with recommendation for daily dressing changes. These recommendations were followed, and patient did not develop any signs or symptoms of infection throughout his hospitalization.  #T2DM Patient is on 20-30 units of Humalog with meals, no basal dose. Most recent A1c of 6.0 in April 2022.  He was transitioned to NovoLog 10 units 3 times daily with meals and SSI moderate and blood sugars were monitored regularly throughout his hospitalization.  Patient had an episode of hypoglycemia on 10/12 with CBG measuring 59. Prior to this, he was given 20units of Novolog. Dosing was changed, and hypoglycemia corrected after patient was given 8oz juice. Blood sugars were monitored and otherwise stable throughout his hospitalization.  He was discharged home on his home regimen.   #CKD stage IIIb Baseline creatinine ~2.  BMPs were followed regularly throughout his hospitalization.  Nephrotoxic meds were avoided throughout his hospitalization.  Creatinine remained stable in the 2.2-2.4 range until 10/18, at which time creatinine slowly increased, to 2.59 on 10/20.   #H/o [facility] s/p liver transplant Patient states that he underwent liver transplant 9 years ago. He has not had any issues since. He takes mycophenolate 1000 mg p.o. twice daily at home, therefore this was continued daily during his hospitalization.  As above, paracentesis was performed to see if his ascites could have been due to underlying liver dysfunction. SAAG of 1.2 concerning for portal hypertension. RUQ ultrasound did not show ascites. His liver enzymes have also remained stable.  PT/INR WNL  on admission.  His ascites and edema seem to be more likely related to his heart failure rather than underlying liver dysfunction.   #HTN BP has been stable since arrival with only a few readings indicative of hypotension. Patient continued on hydralazine 89m PO every morning and 12.526mPO at bedtime during hospitalization.   #HLD Patient continued on gemfibrozil 30057mO twice daily during hospitalization.   #Gout Patient continued on allopurinol 150m41m daily during hospitalization.  Discharge Exam:   BP 117/62 (BP Location: Right Arm)   Pulse 83   Temp 97.8 F (36.6 C) (Oral)   Resp 20   Ht _0  (1.778 m)   Wt 86.3 kg Comment: scale C  SpO2 97%   BMI 27.29 kg/m  Discharge exam:  Constitutional: Sitting upright in chair appears comfortable in no acute distress HENT: Normocephalic and atraumatic.  Cardiovascular: Regular rate and rhythm. No murmurs/rubs/gallops Pulmonary: Normal pulmonary effort. No wheezing, rales, rhonchi appreciated. Abdominal: Abdominal distention, somewhat softer compared to exam prior, Normal bowel sounds. No tenderness to palpation. Musculoskeletal: 1+ edema of the RLE in unna boot, 1-2+ of LLE, improved from exam yesterday Skin: Warm and dry. Dark red/purple left shin skin discoloration. There is dark purple ecchymosis over the RLQ and LLQ. Paracentesis site with no active signs of infection or bleeding. Unna boot on right leg. Neurological: Aox3, moving all extermities, no focal deficit appreciated Psychiatric: Mood and behavior normal.  Pertinent Labs, Studies, and Procedures:  CBC Latest Ref Rng & Units 12/03/2020 12/02/2020 12/01/2020  WBC 4.0 - 10.5 K/uL 6.2 6.6 5.6  Hemoglobin 13.0 - 17.0 g/dL 10.4(L) 10.6(L) 10.9(L)  Hematocrit 39.0 - 52.0 % 32.3(L) 33.5(L) 33.8(L)  Platelets 150 - 400 K/uL 130(L) 122(L) 121(L)   BMP Latest Ref Rng & Units 12/03/2020 12/02/2020 12/01/2020  Glucose 70 - 99 mg/dL 121(H) 126(H) 116(H)  BUN 8 - 23  mg/dL  118(H) 114(H) 115(H)  Creatinine 0.61 - 1.24 mg/dL 2.59(H) 2.48(H) 2.44(H)  BUN/Creat Ratio 10 - 24 - - -  Sodium 135 - 145 mmol/L 131(L) 131(L) 134(L)  Potassium 3.5 - 5.1 mmol/L 3.9 4.6 4.4  Chloride 98 - 111 mmol/L 90(L) 89(L) 93(L)  CO2 22 - 32 mmol/L _0 Calcium 8.9 - 10.3 mg/dL 9.6 9.5 9.9   CBG (last 3)  Recent Labs    12/02/20 2130 12/03/20 0628 12/03/20 1201  GLUCAP 116* 137* 96   Paracentesis: Albumin, fluid: 3.6 Protein, fluid: 5.3 Body fluid: red, cloudy Total Nucleated Cell Count, Fluid 0 - 1,000 cu mm 312   Neutrophil Count, Fluid 0 - 25 % 5   Lymphs, Fluid % 77   Monocyte-Macrophage-Serous Fluid 50 - 90 % 18 Low      DG Chest 2 View  Result Date: 11/22/2020 CLINICAL DATA:  65 year old male with history of shortness of breath. EXAM: CHEST - 2 VIEW COMPARISON:  Chest x-ray 11/20/2020. FINDINGS: Diffuse peribronchial cuffing. Small right pleural effusion with opacity at the right base, favored to reflect subsegmental atelectasis. No definite consolidative airspace disease. No left pleural effusion. No pneumothorax. No definite suspicious appearing pulmonary nodules or masses are noted. Cephalization of the pulmonary vasculature with mild indistinctness of interstitial markings. Mild cardiomegaly. Upper mediastinal contours are within normal limits. Atherosclerotic calcifications in the thoracic aorta. IMPRESSION: 1. Cardiomegaly with pulmonary venous congestion. There is also very mild indistinctness of the interstitial markings, which could suggest developing interstitial pulmonary edema. Clinical correlation for signs and symptoms of congestive heart failure is recommended. 2. Stable small right pleural effusion with probable subsegmental atelectasis in the right lung base. 3. Aortic atherosclerosis. Electronically Signed   By: Vinnie Langton M.D.   On: 11/22/2020 06:46   ECHOCARDIOGRAM COMPLETE  Result Date: 11/23/2020    ECHOCARDIOGRAM REPORT   Patient Name:    WAYDEN SCHWERTNER Date of Exam: 11/23/2020 Medical Rec #:  [id number]        Height:       70.0 in Accession #:    [id number]       Weight:       203.0 lb Date of Birth:  29-Mar-1955       BSA:          2.101 m Patient Age:    10 years         BP:           125/67 mmHg Patient Gender: M                HR:           88 bpm. Exam Location:  Inpatient Procedure: 2D Echo, Color Doppler and Cardiac Doppler Indications:    H70.26 Acute diastolic (congestive) heart failure  History:        Patient has prior history of Echocardiogram examinations, most                 recent 07/03/2019. CHF, Pulmonary HTN; Risk Factors:Diabetes and                 Hypertension.  Sonographer:    Raquel Sarna Senior RDCS Referring Phys: [id number] ELLIOTT WENTZ IMPRESSIONS  1. Left ventricular ejection fraction, by estimation, is 65 to 70%. The left ventricle has normal function. The left ventricle has no regional wall motion abnormalities. There is mild concentric left ventricular hypertrophy. Left ventricular diastolic parameters are consistent with Grade II diastolic dysfunction (pseudonormalization). There  is the interventricular septum is flattened in systole, consistent with right ventricular pressure overload and the interventricular septum is flattened in systole  and diastole, consistent with right ventricular pressure and volume overload.  2. Right ventricular systolic function is normal. The right ventricular size is mildly enlarged. There is severely elevated pulmonary artery systolic pressure. The estimated right ventricular systolic pressure is 94.5 mmHg.  3. Left atrial size was severely dilated.  4. Right atrial size was severely dilated.  5. MV appears to open well. Mean gradients may be exaggerated by elevated heart rate. MVA by PHT is 2.6 cm2, by planimetry is 5.76 cm2. Visually there is no appearance of stenosis based on leaflet mobility, there may be very mild stenosis due to MAC. The mitral valve is normal in structure. Trivial mitral  valve regurgitation. The mean mitral valve gradient is 8.5 mmHg. Moderate to severe mitral annular calcification.  6. Tricuspid valve regurgitation is moderate.  7. The aortic valve is tricuspid. There is mild calcification of the aortic valve. Aortic valve regurgitation is not visualized. No aortic stenosis is present.  8. The inferior vena cava is dilated in size with <50% respiratory variability, suggesting right atrial pressure of 15 mmHg. Comparison(s): No significant change from prior study. Conclusion(s)/Recommendation(s): Severely elevated right sided pressures, similar to echo 06/2019 (58 mmHg on prior study, 61 mmHg on current study). FINDINGS  Left Ventricle: Left ventricular ejection fraction, by estimation, is 65 to 70%. The left ventricle has normal function. The left ventricle has no regional wall motion abnormalities. The left ventricular internal cavity size was normal in size. There is  mild concentric left ventricular hypertrophy. The interventricular septum is flattened in systole, consistent with right ventricular pressure overload and the interventricular septum is flattened in systole and diastole, consistent with right ventricular pressure and volume overload. Left ventricular diastolic parameters are consistent with Grade II diastolic dysfunction (pseudonormalization). Right Ventricle: The right ventricular size is mildly enlarged. Right vetricular wall thickness was not well visualized. Right ventricular systolic function is normal. There is severely elevated pulmonary artery systolic pressure. The tricuspid regurgitant velocity is 3.40 m/s, and with an assumed right atrial pressure of 15 mmHg, the estimated right ventricular systolic pressure is 85.9 mmHg. Left Atrium: Left atrial size was severely dilated. Right Atrium: Right atrial size was severely dilated. Pericardium: There is no evidence of pericardial effusion. Mitral Valve: MV appears to open well. Mean gradients may be exaggerated  by elevated heart rate. MVA by PHT is 2.6 cm2, by planimetry is 5.76 cm2. Visually there is no appearance of stenosis based on leaflet mobility, there may be very mild stenosis due to MAC. The mitral valve is normal in structure. Moderate to severe mitral annular calcification. Trivial mitral valve regurgitation. MV peak gradient, 15.1 mmHg. The mean mitral valve gradient is 8.5 mmHg with average heart rate of 102 bpm. Tricuspid Valve: The tricuspid valve is normal in structure. Tricuspid valve regurgitation is moderate . No evidence of tricuspid stenosis. Aortic Valve: The aortic valve is tricuspid. There is mild calcification of the aortic valve. Aortic valve regurgitation is not visualized. No aortic stenosis is present. Aortic valve mean gradient measures 13.0 mmHg. Aortic valve peak gradient measures 23.4 mmHg. Aortic valve area, by VTI measures 3.31 cm. Pulmonic Valve: The pulmonic valve was not well visualized. Pulmonic valve regurgitation is not visualized. No evidence of pulmonic stenosis. Aorta: The aortic root, ascending aorta, aortic arch and descending aorta are all structurally normal, with no evidence of dilitation or  obstruction. Venous: The inferior vena cava is dilated in size with less than 50% respiratory variability, suggesting right atrial pressure of 15 mmHg. IAS/Shunts: No atrial level shunt detected by color flow Doppler.  LEFT VENTRICLE PLAX 2D LVIDd:         5.70 cm   Diastology LVIDs:         3.10 cm   LV e' medial:    9.68 cm/s LV PW:         1.10 cm   LV E/e' medial:  18.4 LV IVS:        1.20 cm   LV e' lateral:   11.10 cm/s LVOT diam:     2.40 cm   LV E/e' lateral: 16.0 LV SV:         155 LV SV Index:   74 LVOT Area:     4.52 cm  RIGHT VENTRICLE RV S prime:     19.00 cm/s TAPSE (M-mode): 3.0 cm LEFT ATRIUM              Index        RIGHT ATRIUM           Index LA diam:        4.90 cm  2.33 cm/m   RA Area:     33.70 cm LA Vol (A2C):   146.0 ml 69.50 ml/m  RA Volume:   125.00 ml  59.51 ml/m LA Vol (A4C):   105.0 ml 49.99 ml/m LA Biplane Vol: 129.0 ml 61.41 ml/m  AORTIC VALVE AV Area (Vmax):    3.14 cm AV Area (Vmean):   3.17 cm AV Area (VTI):     3.31 cm AV Vmax:           242.00 cm/s AV Vmean:          171.000 cm/s AV VTI:            0.469 m AV Peak Grad:      23.4 mmHg AV Mean Grad:      13.0 mmHg LVOT Vmax:         168.00 cm/s LVOT Vmean:        120.000 cm/s LVOT VTI:          0.343 m LVOT/AV VTI ratio: 0.73  AORTA Ao Root diam: 3.50 cm Ao Asc diam:  3.70 cm MITRAL VALVE                TRICUSPID VALVE MV Area (PHT): 2.72 cm     TR Peak grad:   46.2 mmHg MV Area VTI:   3.58 cm     TR Vmax:        340.00 cm/s MV Peak grad:  15.1 mmHg MV Mean grad:  8.5 mmHg     SHUNTS MV Vmax:       1.94 m/s     Systemic VTI:  0.34 m MV Vmean:      141.0 cm/s   Systemic Diam: 2.40 cm MV Decel Time: 279 msec MV E velocity: 178.00 cm/s MV A velocity: 167.00 cm/s MV E/A ratio:  1.07 Buford Dresser MD Electronically signed by Buford Dresser MD Signature Date/Time: 11/23/2020/11:24:35 AM    Final    VAS [place] LOWER EXTREMITY VENOUS (DVT)  Result Date: 11/23/2020  Lower Venous DVT Study Patient Name:  SELBY FOISY  Date of Exam:   11/23/2020 Medical Rec #: [id number]         Accession #:    [id number] Date of  Birth: 06-Apr-1955        Patient Gender: M Patient Age:   12 years Exam Location:  [facility] Procedure:      VAS [place] LOWER EXTREMITY VENOUS (DVT) Referring Phys: ERIK HOFFMAN --------------------------------------------------------------------------------  Indications: Edema, discoloration right worse than left.  Limitations: Poor ultrasound/tissue interface. Comparison Study: 11-17-2014 Lower extremity venous reflux avluation was                   positive for venous incompetence bilaterally. Performing Technologist: Darlin Coco RDMS, RVT  Examination Guidelines: A complete evaluation includes B-mode imaging, spectral Doppler, color Doppler, and power Doppler as needed  of all accessible portions of each vessel. Bilateral testing is considered an integral part of a complete examination. Limited examinations for reoccurring indications may be performed as noted. The reflux portion of the exam is performed with the patient in reverse Trendelenburg.  +---------+---------------+---------+-----------+----------+--------------+ RIGHT    CompressibilityPhasicitySpontaneityPropertiesThrombus Aging +---------+---------------+---------+-----------+----------+--------------+ CFV      Full           Yes      Yes                                 +---------+---------------+---------+-----------+----------+--------------+ SFJ      Full                                                        +---------+---------------+---------+-----------+----------+--------------+ FV Prox  Full                                                        +---------+---------------+---------+-----------+----------+--------------+ FV Mid   Full                                                        +---------+---------------+---------+-----------+----------+--------------+ FV DistalFull                                                        +---------+---------------+---------+-----------+----------+--------------+ PFV      Full                                                        +---------+---------------+---------+-----------+----------+--------------+ POP      Full           Yes      Yes                                 +---------+---------------+---------+-----------+----------+--------------+ PTV      Full                                                        +---------+---------------+---------+-----------+----------+--------------+  PERO     Full                                                        +---------+---------------+---------+-----------+----------+--------------+ Gastroc  Full                                                         +---------+---------------+---------+-----------+----------+--------------+     Summary: RIGHT: - There is no evidence of deep vein thrombosis in the lower extremity.  - No cystic structure found in the popliteal fossa.  LEFT: - Unable to visualize contralateral CFV due to patient positioning.  *See table(s) above for measurements and observations. Electronically signed by Monica Martinez MD on 11/23/2020 at 12:19:15 PM.    Final      Discharge Instructions: Discharge Instructions     Call MD for:  difficulty breathing, headache or visual disturbances   Complete by: As directed    Call MD for:  persistant dizziness or light-headedness   Complete by: As directed    Call MD for:  persistant nausea and vomiting   Complete by: As directed    Call MD for:  temperature >100.4   Complete by: As directed    Discharge wound care:   Complete by: As directed    Clean the right great toe with NS, pat dry then place a small piece of Xeroform gauze Gregory Tanner # [id number]) over the wound and wrap with small Mancel Parsons Gregory Tanner # [phone]). Change daily.       Signed: [name], MD 12/03/2020, 1:37 PM   Pager: [phone]

## 2020-12-04 ENCOUNTER — Ambulatory Visit: Payer: Medicare Other | Admitting: Sports Medicine

## 2020-12-05 ENCOUNTER — Emergency Department (HOSPITAL_COMMUNITY): Payer: Medicare Other

## 2020-12-05 ENCOUNTER — Other Ambulatory Visit: Payer: Self-pay

## 2020-12-05 ENCOUNTER — Observation Stay (HOSPITAL_COMMUNITY): Payer: Medicare Other

## 2020-12-05 ENCOUNTER — Inpatient Hospital Stay (HOSPITAL_COMMUNITY)
Admission: EM | Admit: 2020-12-05 | Discharge: 2020-12-16 | DRG: 286 | Disposition: A | Discharge status: Home Health | Payer: Medicare Other | Attending: Internal Medicine | Admitting: Internal Medicine

## 2020-12-05 DIAGNOSIS — Z7982 Long term (current) use of aspirin: Secondary | ICD-10-CM | POA: Diagnosis not present

## 2020-12-05 DIAGNOSIS — I361 Nonrheumatic tricuspid (valve) insufficiency: Secondary | ICD-10-CM | POA: Diagnosis not present

## 2020-12-05 DIAGNOSIS — Z944 Liver transplant status: Secondary | ICD-10-CM | POA: Diagnosis not present

## 2020-12-05 DIAGNOSIS — E871 Hypo-osmolality and hyponatremia: Secondary | ICD-10-CM | POA: Diagnosis not present

## 2020-12-05 DIAGNOSIS — Z8505 Personal history of malignant neoplasm of liver: Secondary | ICD-10-CM | POA: Diagnosis not present

## 2020-12-05 DIAGNOSIS — R188 Other ascites: Secondary | ICD-10-CM | POA: Diagnosis not present

## 2020-12-05 DIAGNOSIS — Z20822 Contact with and (suspected) exposure to covid-19: Secondary | ICD-10-CM | POA: Diagnosis present

## 2020-12-05 DIAGNOSIS — K7469 Other cirrhosis of liver: Secondary | ICD-10-CM | POA: Diagnosis not present

## 2020-12-05 DIAGNOSIS — E1122 Type 2 diabetes mellitus with diabetic chronic kidney disease: Secondary | ICD-10-CM | POA: Diagnosis present

## 2020-12-05 DIAGNOSIS — I5082 Biventricular heart failure: Secondary | ICD-10-CM | POA: Diagnosis present

## 2020-12-05 DIAGNOSIS — I4510 Unspecified right bundle-branch block: Secondary | ICD-10-CM | POA: Diagnosis present

## 2020-12-05 DIAGNOSIS — J9601 Acute respiratory failure with hypoxia: Secondary | ICD-10-CM | POA: Diagnosis not present

## 2020-12-05 DIAGNOSIS — R059 Cough, unspecified: Secondary | ICD-10-CM

## 2020-12-05 DIAGNOSIS — Z79899 Other long term (current) drug therapy: Secondary | ICD-10-CM | POA: Diagnosis not present

## 2020-12-05 DIAGNOSIS — R911 Solitary pulmonary nodule: Secondary | ICD-10-CM | POA: Diagnosis not present

## 2020-12-05 DIAGNOSIS — E1151 Type 2 diabetes mellitus with diabetic peripheral angiopathy without gangrene: Secondary | ICD-10-CM | POA: Diagnosis not present

## 2020-12-05 DIAGNOSIS — G894 Chronic pain syndrome: Secondary | ICD-10-CM | POA: Diagnosis present

## 2020-12-05 DIAGNOSIS — I071 Rheumatic tricuspid insufficiency: Secondary | ICD-10-CM | POA: Diagnosis not present

## 2020-12-05 DIAGNOSIS — I130 Hypertensive heart and chronic kidney disease with heart failure and stage 1 through stage 4 chronic kidney disease, or unspecified chronic kidney disease: Secondary | ICD-10-CM | POA: Diagnosis not present

## 2020-12-05 DIAGNOSIS — R0602 Shortness of breath: Secondary | ICD-10-CM | POA: Diagnosis not present

## 2020-12-05 DIAGNOSIS — I517 Cardiomegaly: Secondary | ICD-10-CM | POA: Diagnosis not present

## 2020-12-05 DIAGNOSIS — I509 Heart failure, unspecified: Secondary | ICD-10-CM | POA: Diagnosis not present

## 2020-12-05 DIAGNOSIS — I7121 Aneurysm of the ascending aorta, without rupture: Secondary | ICD-10-CM | POA: Diagnosis present

## 2020-12-05 DIAGNOSIS — N1832 Chronic kidney disease, stage 3b: Secondary | ICD-10-CM | POA: Diagnosis not present

## 2020-12-05 DIAGNOSIS — I868 Varicose veins of other specified sites: Secondary | ICD-10-CM | POA: Diagnosis present

## 2020-12-05 DIAGNOSIS — Z833 Family history of diabetes mellitus: Secondary | ICD-10-CM | POA: Diagnosis not present

## 2020-12-05 DIAGNOSIS — Z8249 Family history of ischemic heart disease and other diseases of the circulatory system: Secondary | ICD-10-CM

## 2020-12-05 DIAGNOSIS — I50810 Right heart failure, unspecified: Secondary | ICD-10-CM | POA: Diagnosis not present

## 2020-12-05 DIAGNOSIS — R079 Chest pain, unspecified: Secondary | ICD-10-CM | POA: Diagnosis not present

## 2020-12-05 DIAGNOSIS — I5033 Acute on chronic diastolic (congestive) heart failure: Secondary | ICD-10-CM | POA: Diagnosis not present

## 2020-12-05 DIAGNOSIS — I700 Atherosclerosis of aorta: Secondary | ICD-10-CM | POA: Diagnosis not present

## 2020-12-05 DIAGNOSIS — R000 Tachycardia, unspecified: Secondary | ICD-10-CM | POA: Diagnosis not present

## 2020-12-05 DIAGNOSIS — I712 Thoracic aortic aneurysm, without rupture, unspecified: Secondary | ICD-10-CM | POA: Diagnosis not present

## 2020-12-05 DIAGNOSIS — R0603 Acute respiratory distress: Secondary | ICD-10-CM | POA: Diagnosis present

## 2020-12-05 DIAGNOSIS — J811 Chronic pulmonary edema: Secondary | ICD-10-CM | POA: Diagnosis not present

## 2020-12-05 DIAGNOSIS — E785 Hyperlipidemia, unspecified: Secondary | ICD-10-CM | POA: Diagnosis present

## 2020-12-05 DIAGNOSIS — B974 Respiratory syncytial virus as the cause of diseases classified elsewhere: Secondary | ICD-10-CM | POA: Diagnosis present

## 2020-12-05 DIAGNOSIS — J9811 Atelectasis: Secondary | ICD-10-CM | POA: Diagnosis not present

## 2020-12-05 DIAGNOSIS — E875 Hyperkalemia: Secondary | ICD-10-CM | POA: Diagnosis present

## 2020-12-05 DIAGNOSIS — R0902 Hypoxemia: Secondary | ICD-10-CM | POA: Diagnosis not present

## 2020-12-05 DIAGNOSIS — R0789 Other chest pain: Secondary | ICD-10-CM | POA: Diagnosis not present

## 2020-12-05 DIAGNOSIS — Z794 Long term (current) use of insulin: Secondary | ICD-10-CM

## 2020-12-05 LAB — RESP PANEL BY RT-PCR (FLU A&B, COVID) ARPGX2
Influenza A by PCR: NEGATIVE
Influenza B by PCR: NEGATIVE
SARS Coronavirus 2 by RT PCR: NEGATIVE

## 2020-12-05 LAB — RESPIRATORY PANEL BY PCR

## 2020-12-05 LAB — CBC WITH DIFFERENTIAL/PLATELET
Abs Immature Granulocytes: 0.07 K/uL (ref 0.00–0.07)
Basophils Absolute: 0 K/uL (ref 0.0–0.1)
Basophils Relative: 0 %
Eosinophils Absolute: 0.1 K/uL (ref 0.0–0.5)
Eosinophils Relative: 1 %
HCT: 31.9 % — ABNORMAL LOW (ref 39.0–52.0)
Hemoglobin: 10.5 g/dL — ABNORMAL LOW (ref 13.0–17.0)
Immature Granulocytes: 1 %
Lymphocytes Relative: 7 %
Lymphs Abs: 0.5 K/uL — ABNORMAL LOW (ref 0.7–4.0)
MCH: 29.7 pg (ref 26.0–34.0)
MCHC: 32.9 g/dL (ref 30.0–36.0)
MCV: 90.4 fL (ref 80.0–100.0)
Monocytes Absolute: 0.5 K/uL (ref 0.1–1.0)
Monocytes Relative: 6 %
Neutro Abs: 6.1 K/uL (ref 1.7–7.7)
Neutrophils Relative %: 85 %
Platelets: 133 K/uL — ABNORMAL LOW (ref 150–400)
RBC: 3.53 MIL/uL — ABNORMAL LOW (ref 4.22–5.81)
RDW: 17.2 % — ABNORMAL HIGH (ref 11.5–15.5)
WBC: 7.2 K/uL (ref 4.0–10.5)
nRBC: 0 % (ref 0.0–0.2)

## 2020-12-05 LAB — GLUCOSE, CAPILLARY: Glucose-Capillary: 141 mg/dL — ABNORMAL HIGH (ref 70–99)

## 2020-12-05 LAB — COMPREHENSIVE METABOLIC PANEL
ALT: 15 U/L (ref 0–44)
AST: 30 U/L (ref 15–41)
Albumin: 4.6 g/dL (ref 3.5–5.0)
Alkaline Phosphatase: 66 U/L (ref 38–126)
Anion gap: 17 — ABNORMAL HIGH (ref 5–15)
BUN: 114 mg/dL — ABNORMAL HIGH (ref 8–23)
CO2: 24 mmol/L (ref 22–32)
Calcium: 10.1 mg/dL (ref 8.9–10.3)
Chloride: 84 mmol/L — ABNORMAL LOW (ref 98–111)
Creatinine, Ser: 2.29 mg/dL — ABNORMAL HIGH (ref 0.61–1.24)
GFR, Estimated: 31 mL/min — ABNORMAL LOW (ref >60)
Glucose, Bld: 121 mg/dL — ABNORMAL HIGH (ref 70–99)
Potassium: 3.9 mmol/L (ref 3.5–5.1)
Sodium: 125 mmol/L — ABNORMAL LOW (ref 135–145)
Total Bilirubin: 1.5 mg/dL — ABNORMAL HIGH (ref 0.3–1.2)
Total Protein: 6.9 g/dL (ref 6.5–8.1)

## 2020-12-05 LAB — TROPONIN I (HIGH SENSITIVITY)
Troponin I (High Sensitivity): 39 ng/L — ABNORMAL HIGH
Troponin I (High Sensitivity): 39 ng/L — ABNORMAL HIGH

## 2020-12-05 LAB — BRAIN NATRIURETIC PEPTIDE: B Natriuretic Peptide: 237.7 pg/mL — ABNORMAL HIGH (ref 0.0–100.0)

## 2020-12-05 MED ORDER — ONDANSETRON HCL 4 MG/2ML IJ SOLN
4.0000 mg | Freq: Three times a day (TID) | INTRAMUSCULAR | Status: DC | PRN
  Administered 2020-12-05: 4 mg via INTRAVENOUS
  Filled 2020-12-05: qty 2

## 2020-12-05 MED ORDER — ACETAMINOPHEN 650 MG RE SUPP: 325.0000 mg | Freq: Four times a day (QID) | RECTAL | Status: DC | PRN

## 2020-12-05 MED ORDER — ENOXAPARIN SODIUM 30 MG/0.3ML IJ SOSY
30.0000 mg | PREFILLED_SYRINGE | INTRAMUSCULAR | Status: DC
  Administered 2020-12-06 – 2020-12-09 (×4): 30 mg via SUBCUTANEOUS
  Filled 2020-12-05 (×4): qty 0.3

## 2020-12-05 MED ORDER — ACETAMINOPHEN 325 MG PO TABS
325.0000 mg | ORAL_TABLET | Freq: Four times a day (QID) | ORAL | Status: DC | PRN
  Administered 2020-12-05 – 2020-12-10 (×5): 325 mg via ORAL
  Filled 2020-12-05 (×5): qty 1

## 2020-12-05 MED ORDER — GUAIFENESIN-DM 100-10 MG/5ML PO SYRP
10.0000 mL | ORAL_SOLUTION | ORAL | Status: DC | PRN
  Administered 2020-12-05: 10 mL via ORAL
  Filled 2020-12-05: qty 10

## 2020-12-05 MED ORDER — ASPIRIN EC 81 MG PO TBEC
81.0000 mg | DELAYED_RELEASE_TABLET | Freq: Every day | ORAL | Status: DC
  Administered 2020-12-05 – 2020-12-10 (×6): 81 mg via ORAL
  Filled 2020-12-05 (×6): qty 1

## 2020-12-05 MED ORDER — FUROSEMIDE 10 MG/ML IJ SOLN
40.0000 mg | Freq: Once | INTRAMUSCULAR | Status: AC
  Administered 2020-12-05: 40 mg via INTRAVENOUS
  Filled 2020-12-05: qty 4

## 2020-12-05 MED ORDER — SODIUM CHLORIDE 0.9% FLUSH
3.0000 mL | Freq: Two times a day (BID) | INTRAVENOUS | Status: DC
  Administered 2020-12-05 – 2020-12-15 (×13): 3 mL via INTRAVENOUS

## 2020-12-05 MED ORDER — BENZONATATE 100 MG PO CAPS
200.0000 mg | ORAL_CAPSULE | Freq: Three times a day (TID) | ORAL | Status: DC
  Administered 2020-12-05 – 2020-12-16 (×34): 200 mg via ORAL
  Filled 2020-12-05 (×34): qty 2

## 2020-12-05 MED ORDER — HYDRALAZINE HCL 25 MG PO TABS
12.5000 mg | ORAL_TABLET | Freq: Every day | ORAL | Status: DC
  Administered 2020-12-05 – 2020-12-15 (×11): 12.5 mg via ORAL
  Filled 2020-12-05 (×11): qty 1

## 2020-12-05 MED ORDER — INSULIN ASPART 100 UNIT/ML IJ SOLN
0.0000 [IU] | Freq: Three times a day (TID) | INTRAMUSCULAR | Status: DC
  Administered 2020-12-05 (×2): 2 [IU] via SUBCUTANEOUS
  Administered 2020-12-06 – 2020-12-08 (×4): 1 [IU] via SUBCUTANEOUS
  Administered 2020-12-09: 2 [IU] via SUBCUTANEOUS
  Administered 2020-12-09 – 2020-12-10 (×2): 1 [IU] via SUBCUTANEOUS

## 2020-12-05 MED ORDER — MYCOPHENOLATE MOFETIL 250 MG PO CAPS
1000.0000 mg | ORAL_CAPSULE | Freq: Two times a day (BID) | ORAL | Status: DC
  Administered 2020-12-05 – 2020-12-16 (×23): 1000 mg via ORAL
  Filled 2020-12-05 (×23): qty 4

## 2020-12-05 MED ORDER — HYDRALAZINE HCL 25 MG PO TABS
25.0000 mg | ORAL_TABLET | Freq: Every morning | ORAL | Status: DC
  Administered 2020-12-05 – 2020-12-14 (×9): 25 mg via ORAL
  Filled 2020-12-05 (×12): qty 1

## 2020-12-05 MED ORDER — SPIRONOLACTONE 25 MG PO TABS
25.0000 mg | ORAL_TABLET | Freq: Two times a day (BID) | ORAL | Status: DC
  Administered 2020-12-05 – 2020-12-10 (×10): 25 mg via ORAL
  Filled 2020-12-05 (×12): qty 1

## 2020-12-05 NOTE — ED Provider Notes (Signed)
[facility] EMERGENCY DEPARTMENT Provider Note   CSN: [id number] Arrival date & time: 12/05/20  0932     History Chief Complaint  Patient presents with   Shortness of Breath    Gregory Tanner is a 65 y.o. male.  Patient is a 65 year old male with past medical history of liver transplant, chronic renal insufficiency, hypertension, and congestive heart failure.  Patient presenting today with complaints of shortness of breath.  He was just discharged 2 days ago after being admitted for a CHF exacerbation and undergoing diuresis.  Patient states he was not feeling much better when he left and his breathing has worsened this evening.  He becomes dyspneic even with speaking.  He denies any fevers or chills.  He denies any chest pain.  The history is provided by the patient.  Shortness of Breath Severity:  Moderate Onset quality:  Gradual Timing:  Constant Progression:  Worsening Chronicity:  Recurrent Context: activity   Relieved by:  Nothing Worsened by:  Activity Ineffective treatments:  None tried     Past Medical History:  Diagnosis Date   Ankylosis of lumbar spine 02/21/2014   Benign essential hypertension 10/26/2015   BP (high blood pressure) 12/27/2010   Chronic diastolic heart failure ([place]) 09/26/2016   Chronic pain associated with significant psychosocial dysfunction 06/17/2015   Chronic pain disorder 10/28/2015   CKD (chronic kidney disease) stage 3, GFR 30-59 ml/min (HCC) 03/27/2017   Degeneration of intervertebral disc of lumbar region 07/05/2013   Degenerative disc disease, lumbar 07/26/2012   Diabetes mellitus without complication ([place])    Encounter for other specified prophylactic measures 10/10/2011   Excess weight 10/10/2011   Hearing loss    Heart failure, diastolic, acute on chronic ([place]) 07/24/2019   Hepatocellular carcinoma ([place]) 12/27/2010   Overview:  Embolized 6/12    History of liver transplant ([place]) 07/20/2011   Overview:  06/07/2011 for  cryptogenic cirrhosis and [place]    Hypertension    Hypertensive heart disease with heart failure ([place]) 10/26/2015   Hypokalemia    Liver disease    Lumbar radiculopathy 10/10/2012   Nerve root pain 06/17/2015   Overview:  Right    SOB (shortness of breath) on exertion 10/28/2015   Swelling    Type 2 diabetes mellitus ([place]) 12/27/2010   Overview:  H1C 6.5 12/2010    Uncontrolled type 2 diabetes mellitus with insulin therapy 06/21/2018    Patient Active Problem List   Diagnosis Date Noted   Ascites    Thrombocytopenia ([place]) 11/23/2020   Iron deficiency anemia 11/23/2020   Acute exacerbation of CHF (congestive heart failure) ([place]) 11/22/2020   Volume overload 01/22/2020   PHT (pulmonary hypertension) ([place]) 01/19/2020   Swelling    Liver disease    Hypertension    Hearing loss    Diabetes mellitus without complication ([place])    Hypokalemia    Heart failure, diastolic, acute on chronic ([place]) 07/24/2019   Uncontrolled type 2 diabetes mellitus with insulin therapy 06/21/2018   CKD (chronic kidney disease) stage 3, GFR 30-59 ml/min (HCC) 03/27/2017   Chronic diastolic heart failure ([place]) 09/26/2016   Chronic pain disorder 10/28/2015   SOB (shortness of breath) on exertion 10/28/2015   Hypertensive heart disease with heart failure ([place]) 10/26/2015   Benign essential hypertension 10/26/2015   Chronic pain associated with significant psychosocial dysfunction 06/17/2015   Nerve root pain 06/17/2015   Ankylosis of lumbar spine 02/21/2014   Degeneration of intervertebral disc of lumbar region 07/05/2013  Lumbar radiculopathy 10/10/2012   Degenerative disc disease, lumbar 07/26/2012   Encounter for other specified prophylactic measures 10/10/2011   Excess weight 10/10/2011   History of liver transplant ([place]) 07/20/2011   Type 2 diabetes mellitus ([place]) 12/27/2010   Hepatocellular carcinoma ([place]) 12/27/2010   BP (high blood pressure) 12/27/2010    Past Surgical History:  Procedure  Laterality Date   BACK SURGERY     BAKER CYST SURGERY     RIGHT LEG   EYE SURGERY Left    HERNIA REPAIR     IR PARACENTESIS  11/30/2020   LIVER SURGERY     IMPLANT   LIVER TRANSPLANT     TONSILL SURGERY     TONSILLECTOMY         Family History  Problem Relation Age of Onset   Cancer Mother    Diabetes Father    Heart attack Father    Schizophrenia Father    Hypertension Brother     Social History   Tobacco Use   Smoking status: Never   Smokeless tobacco: Never  Vaping Use   Vaping Use: Never used  Substance Use Topics   Alcohol use: Yes    Alcohol/week: 0.0 standard drinks    Comment: ocassional   Drug use: Not Currently    Home Medications Prior to Admission medications   Medication Sig Start Date End Date Taking? Authorizing Provider  allopurinol (ZYLOPRIM) 300 MG tablet Take 150 mg by mouth daily.  08/12/16  Yes [provider]  aspirin 81 MG tablet Take 81 mg by mouth daily.   Yes [provider]  azithromycin (ZITHROMAX) 250 MG tablet Please take 2 tablets on day 1, then take 1 tablet once a day on days 2-5. 12/03/20  Yes [name], MD  bumetanide (BUMEX) 2 MG tablet Take 1.5 tablets (3 mg total) by mouth See admin instructions. Take 1.5 tablets (3 mg total ) by mouth daily in the morning, and 1.5 tablets (3 mg total) by mouth daily in the evening. Patient taking differently: Take 3 mg by mouth 2 (two) times daily. 12/03/20  Yes [name], MD  Continuous Blood Gluc Receiver (DEXCOM G6 RECEIVER) DEVI Use 1 each as directed 09/24/20  Yes [provider]  Continuous Blood Gluc Sensor (DEXCOM G6 SENSOR) MISC Use 1 each every 10 (ten) days 09/24/20  Yes [provider]  Continuous Blood Gluc Transmit (DEXCOM G6 TRANSMITTER) MISC Use 1 each every 3 (three) months 09/24/20  Yes [provider]  dextromethorphan-guaiFENesin (MUCINEX DM) 30-600 MG 12hr tablet Take 1 tablet by mouth 2 (two) times daily as needed  for cough. Patient taking differently: Take 1 tablet by mouth 4 (four) times daily as needed for cough. 12/03/20  Yes [name], MD  gemfibrozil (LOPID) 600 MG tablet Take 0.5 tablets (300 mg total) by mouth in the morning and at bedtime. 12/03/20  Yes [name], MD  hydrALAZINE (APRESOLINE) 25 MG tablet Take 0.5-1 tablets (12.5-25 mg total) by mouth See admin instructions. Take 1 tablet (25 mg) in the morning and half a tablet (12.5 mg) before bedtime Patient taking differently: Take 12.5-25 mg by mouth See admin instructions. (25 mg) in the morning  (12.5 mg) before bedtime 12/03/20  Yes [name], MD  insulin lispro (HUMALOG) 100 UNIT/ML KwikPen Inject 20-30 Units into the skin with breakfast, with lunch, and with evening meal. 09/19/20  Yes [provider]  metolazone (ZAROXOLYN) 5 MG tablet Take 1 tablet (  5 mg total) by mouth See admin instructions. Take 1 tablet by mouth on Monday, Wednesday, Friday, and Saturday Patient taking differently: Take 5 mg by mouth See admin instructions. Monday, Wednesday, Friday, and Saturday 12/03/20  Yes [name], MD  Multiple Vitamin (MULTIVITAMIN) tablet Take 1 tablet by mouth daily.   Yes [provider]  mycophenolate (CELLCEPT) 250 MG capsule Take 4 capsules (1,000 mg total) by mouth in the morning and at bedtime. 12/03/20  Yes [name], MD  potassium chloride SA (KLOR-CON) 20 MEQ tablet Take 2 tablets (40 mEq total) by mouth 2 (two) times daily. 12/03/20  Yes [name], MD  spironolactone (ALDACTONE) 25 MG tablet Take 1 tablet (25 mg total) by mouth 2 (two) times daily. 12/03/20 01/02/21 Yes [name], MD  TART CHERRY PO Take 1 tablet by mouth in the morning and at bedtime.   Yes [provider]    Allergies    Iodinated diagnostic agents, Jardiance [empagliflozin], Latex, Metformin and related, and Rosiglitazone  Review of Systems   Review of Systems   Respiratory:  Positive for shortness of breath.   All other systems reviewed and are negative.  Physical Exam Updated Vital Signs BP (!) 143/60   Pulse 93   Temp 97.6 F (36.4 C) (Oral)   Resp (!) 21   Ht 5\' 10"  (1.778 m)   Wt 89.8 kg   SpO2 98%   BMI 28.41 kg/m   Physical Exam Vitals and nursing note reviewed.  Constitutional:      General: He is not in acute distress.    Appearance: He is well-developed. He is not diaphoretic.  HENT:     Head: Normocephalic and atraumatic.  Cardiovascular:     Rate and Rhythm: Normal rate and regular rhythm.     Heart sounds: No murmur heard.   No friction rub.  Pulmonary:     Effort: Pulmonary effort is normal. No respiratory distress.     Breath sounds: Examination of the right-lower field reveals rales. Examination of the left-lower field reveals rales. Rales present. No wheezing.  Abdominal:     General: Bowel sounds are normal. There is no distension.     Palpations: Abdomen is soft.     Tenderness: There is no abdominal tenderness.  Musculoskeletal:        General: Normal range of motion.     Cervical back: Normal range of motion and neck supple.     Right lower leg: No tenderness. Edema present.     Left lower leg: No tenderness. Edema present.     Comments: There is 2+ pitting edema of both lower extremities.  Skin:    General: Skin is warm and dry.  Neurological:     Mental Status: He is alert and oriented to person, place, and time.     Coordination: Coordination normal.    ED Results / Procedures / Treatments   Labs (all labs ordered are listed, but only abnormal results are displayed) Labs Reviewed  COMPREHENSIVE METABOLIC PANEL  CBC WITH DIFFERENTIAL/PLATELET  BRAIN NATRIURETIC PEPTIDE  TROPONIN I (HIGH SENSITIVITY)    EKG EKG Interpretation  Date/Time:  Saturday December 05 2020 03:31:48 EDT Ventricular Rate:  90 PR Interval:  181 QRS Duration: 133 QT Interval:  397 QTC Calculation: 486 R  Axis:   -1 Text Interpretation: Sinus rhythm Right bundle branch block Abnrm T, consider ischemia, anterolateral lds Minimal ST elevation, lateral leads Confirmed by Veryl Speak [phone]) on 12/05/2020  5:21:21 AM  Radiology DG CHEST PORT 1 VIEW  Result Date: 12/03/2020 CLINICAL DATA:  Cough 5 days EXAM: PORTABLE CHEST 1 VIEW COMPARISON:  11/29/2020 FINDINGS: Cardiac enlargement. Pulmonary vascular congestion unchanged. Improvement in mild edema compared to the prior study. No pleural effusion. No focal infiltrate. IMPRESSION: Cardiac enlargement with pulmonary vascular congestion unchanged. Electronically Signed   By: Franchot Gallo M.D.   On: 12/03/2020 08:02    Procedures Procedures   Medications Ordered in ED Medications  furosemide (LASIX) injection 40 mg (40 mg Intravenous Given 12/05/20 0516)    ED Course  I have reviewed the triage vital signs and the nursing notes.  Pertinent labs & imaging results that were available during my care of the patient were reviewed by me and considered in my medical decision making (see chart for details).    MDM Rules/Calculators/A&P  Patient is a 65 year old male with history of liver transplant, renal insufficiency, hypertension, and CHF.  He was recently discharged after an admission for CHF exacerbation.  He returns with shortness of breath and still seems to have fluid retention.  His sodium is now 125.  Care discussed with the internal medicine teaching service who will evaluate and admit.  Diuresis started in the ER with IV Lasix.  Final Clinical Impression(s) / ED Diagnoses Final diagnoses:  None    Rx / DC Orders ED Discharge Orders     None        Veryl Speak, MD 12/06/20 [phone]

## 2020-12-05 NOTE — ED Notes (Signed)
CBG 163 

## 2020-12-05 NOTE — H&P (Signed)
Date: 12/05/2020               Patient Name:  Gregory Tanner MRN: [id number]  DOB: 1955/05/20 Age / Sex: 65 y.o., male   PCP: Ronita Hipps, MD         Medical Service: Internal Medicine Teaching Service         Attending Physician: Dr. Aldine Contes, MD    First Contact: Dr. Lorin Glass Pager: [phone]  Second Contact: Dr. Lisabeth Devoid Pager: [phone]       After Hours (After 5p/  First Contact Pager: [phone]  weekends / holidays): Second Contact Pager: [phone]   Chief Complaint: SOB and cough  History of Present Illness:   Gregory Tanner is a 65 y/o gentleman with a PMHx of right-sided HF with pulmonary HTN, cryptogenic cirrhosis c/b HCC s/p transplant, CKD Stage 3b, IDDM, HTN, who presents to the ED with c/o SOB and cough.   Gregory Tanner states that since arriving home, his nonproductive cough has progressively worsened.  Gregory Tanner describes episodes of coughing that last 20 to 30 minutes and Gregory Tanner finds it very difficult to catch his breath afterwards.  His cough is mostly nonproductive, but on occasion, Gregory Tanner is able to cough something up; sputum is unremarkable.  Gregory Tanner endorses mild nausea, but denies any fever, chills, abdominal pain, vomiting, or diarrhea.  Gregory Tanner endorses chronic orthopnea that is unchanged since discharge.  Gregory Tanner feels that his chronic lower extremity swelling is actually improved since discharge.  Gregory Tanner states that on discharge, Gregory Tanner was started on 2 new medications including azithromycin and Mucinex, which Gregory Tanner started yesterday.  Gregory Tanner has taken up to 4 tablets of Mucinex per day without relief in his cough.  Per chart review, patient was recently hospitalized from 10/09 to 10/20 for acute on chronic heart failure exacerbation during which Gregory Tanner was diuresed with IV Lasix, metolazone, spironolactone, and acetazolamide.  Discharge weight was 86.3 kg.  Patient states that his cough developed towards the end of this hospitalization.  ED Course:  On arrival to the ED, patient's blood  pressure was 143/60 with heart rate of 93.  Gregory Tanner was saturating at 98% on 2 L of supplemental oxygen via nasal cannula.  His respiratory rate was 21.  Gregory Tanner is afebrile at 97.12F.  Initial lab work remarkable for hyponatremia at 125 with BUN of 114 and creatinine of 2.29.  CBC was unchanged from prior hospitalization.  BMP elevated at 237.  Troponin mildly elevated at 39, however flat trend.  Chest x-ray was obtained that showed pulmonary vascular congestion.   Meds:  Current Meds  Medication Sig   allopurinol (ZYLOPRIM) 300 MG tablet Take 150 mg by mouth daily.    aspirin 81 MG tablet Take 81 mg by mouth daily.   azithromycin (ZITHROMAX) 250 MG tablet Please take 2 tablets on day 1, then take 1 tablet once a day on days 2-5.   bumetanide (BUMEX) 2 MG tablet Take 1.5 tablets (3 mg total) by mouth See admin instructions. Take 1.5 tablets (3 mg total ) by mouth daily in the morning, and 1.5 tablets (3 mg total) by mouth daily in the evening. (Patient taking differently: Take 3 mg by mouth 2 (two) times daily.)   Continuous Blood Gluc Receiver (DEXCOM G6 RECEIVER) DEVI Use 1 each as directed   Continuous Blood Gluc Sensor (DEXCOM G6 SENSOR) MISC Use 1 each every 10 (ten) days   Continuous Blood Gluc Transmit (DEXCOM G6 TRANSMITTER) MISC Use 1 each  every 3 (three) months   dextromethorphan-guaiFENesin (MUCINEX DM) 30-600 MG 12hr tablet Take 1 tablet by mouth 2 (two) times daily as needed for cough. (Patient taking differently: Take 1 tablet by mouth 4 (four) times daily as needed for cough.)   gemfibrozil (LOPID) 600 MG tablet Take 0.5 tablets (300 mg total) by mouth in the morning and at bedtime.   hydrALAZINE (APRESOLINE) 25 MG tablet Take 0.5-1 tablets (12.5-25 mg total) by mouth See admin instructions. Take 1 tablet (25 mg) in the morning and half a tablet (12.5 mg) before bedtime (Patient taking differently: Take 12.5-25 mg by mouth See admin instructions. (25 mg) in the morning  (12.5 mg) before bedtime)    insulin lispro (HUMALOG) 100 UNIT/ML KwikPen Inject 20-30 Units into the skin with breakfast, with lunch, and with evening meal.   metolazone (ZAROXOLYN) 5 MG tablet Take 1 tablet (5 mg total) by mouth See admin instructions. Take 1 tablet by mouth on Monday, Wednesday, Friday, and Saturday (Patient taking differently: Take 5 mg by mouth See admin instructions. Monday, Wednesday, Friday, and Saturday)   Multiple Vitamin (MULTIVITAMIN) tablet Take 1 tablet by mouth daily.   mycophenolate (CELLCEPT) 250 MG capsule Take 4 capsules (1,000 mg total) by mouth in the morning and at bedtime.   potassium chloride SA (KLOR-CON) 20 MEQ tablet Take 2 tablets (40 mEq total) by mouth 2 (two) times daily.   spironolactone (ALDACTONE) 25 MG tablet Take 1 tablet (25 mg total) by mouth 2 (two) times daily.   TART CHERRY PO Take 1 tablet by mouth in the morning and at bedtime.   Allergies: Allergies as of 12/05/2020 - Review Complete 12/05/2020  Allergen Reaction Noted   Iodinated diagnostic agents Hives and Other (See Comments) 11/17/2014   Jardiance [empagliflozin] Dermatitis 11/24/2020   Latex Other (See Comments) 06/17/2015   Metformin and related Nausea And Vomiting 03/27/2017   Rosiglitazone Nausea And Vomiting 10/26/2015   Past Medical History:  Diagnosis Date   Ankylosis of lumbar spine 02/21/2014   Benign essential hypertension 10/26/2015   BP (high blood pressure) 12/27/2010   Chronic diastolic heart failure ([place]) 09/26/2016   Chronic pain associated with significant psychosocial dysfunction 06/17/2015   Chronic pain disorder 10/28/2015   CKD (chronic kidney disease) stage 3, GFR 30-59 ml/min (HCC) 03/27/2017   Degeneration of intervertebral disc of lumbar region 07/05/2013   Degenerative disc disease, lumbar 07/26/2012   Diabetes mellitus without complication ([place])    Encounter for other specified prophylactic measures 10/10/2011   Excess weight 10/10/2011   Hearing loss    Heart failure, diastolic,  acute on chronic ([place]) 07/24/2019   Hepatocellular carcinoma ([place]) 12/27/2010   Overview:  Embolized 6/12    History of liver transplant ([place]) 07/20/2011   Overview:  06/07/2011 for cryptogenic cirrhosis and HCC    Hypertension    Hypertensive heart disease with heart failure ([place]) 10/26/2015   Hypokalemia    Liver disease    Lumbar radiculopathy 10/10/2012   Nerve root pain 06/17/2015   Overview:  Right    SOB (shortness of breath) on exertion 10/28/2015   Swelling    Type 2 diabetes mellitus ([place]) 12/27/2010   Overview:  H1C 6.5 12/2010    Uncontrolled type 2 diabetes mellitus with insulin therapy 06/21/2018   Family History:  Family History  Problem Relation Age of Onset   Cancer Mother    Diabetes Father    Heart attack Father    Schizophrenia Father    Hypertension Brother  Social History:  Lives in [place] with his wife.  Independent in all ADLs.  No tobacco use.  Review of Systems: A complete ROS was negative except as per HPI.   Physical Exam: Blood pressure (!) 131/59, pulse 93, temperature 97.6 F (36.4 C), temperature source Oral, resp. rate 19, height 5\' 10"  (1.778 m), weight 84.4 kg, SpO2 95 %.  Physical Exam Vitals and nursing note reviewed.  Constitutional:      Appearance: Gregory Tanner is normal weight. Gregory Tanner is ill-appearing. Gregory Tanner is not diaphoretic.  HENT:     Head: Normocephalic and atraumatic.     Mouth/Throat:     Mouth: Mucous membranes are moist.     Pharynx: Oropharynx is clear.  Eyes:     Extraocular Movements: Extraocular movements intact.     Pupils: Pupils are equal, round, and reactive to light.  Neck:     Vascular: JVD (up to the mandible) present.  Cardiovascular:     Rate and Rhythm: Normal rate and regular rhythm.     Heart sounds: Murmur (holosystolic murmur heard best the LUSB) heard.  Pulmonary:     Effort: Tachypnea, accessory muscle usage and respiratory distress present.     Breath sounds: Examination of the right-lower field reveals rhonchi.  Examination of the left-lower field reveals rhonchi. Rhonchi present.  Abdominal:     General: Bowel sounds are normal. There is distension.     Tenderness: There is no abdominal tenderness. There is no guarding.     Comments:  + abdominal bruit + caput medusae   Musculoskeletal:     Right lower leg: 2+ Edema present.     Left lower leg: 2+ Edema present.  Skin:    General: Skin is warm and dry.     Comments: Bilateral hyperpigmentation (R>L) of the lower extremities  Neurological:     General: No focal deficit present.     Mental Status: Gregory Tanner is alert and oriented to person, place, and time.     Motor: No weakness.     Comments:  No asterixis   Psychiatric:        Mood and Affect: Mood normal.        Behavior: Behavior normal.   EKG: personally reviewed my interpretation is: Compared to EKG on 10/09. Sinus rhythm with rate of 90. Right bundle branch block. No ST or T wave changes compared to prior.   CXR: personally reviewed my interpretation is: No costodiaphragmatic angle blunting.  Vascular congestion present, perhaps mildly worsened compared to chest x-ray on 10/20.  No infiltrates or opacities noted.  Assessment & Plan by Problem: Active Problems:   Acute respiratory distress  Gregory Tanner is a 65 year old gentleman with a past medical history of right-sided heart failure with high cardiac output, pulmonary hypertension of unknown etiology, cryptogenic cirrhosis c/b HCC s/p transplant on mycophenolate, IDDM, CKD Stage 3b who presents to the ED with complaints of shortness of breath with cough currently admitted for acute hypoxic respiratory failure with respiratory distress.  # Acute Hypoxic Respiratory Failure  # Acute Respiratory Distress  Several day history of nonproductive cough that has progressively worsened over the last 48 hours leading to worsening in shortness of breath.  On examination.  Patient is saturating at 96% on 3 L of supplemental oxygen via nasal  cannula.  However despite this, there is evidence of respiratory distress with difficulty completing sentences.    Etiology at this time is unknown, however differential includes pneumonia versus acute heart failure exacerbation  versus primary lung disease such as ILD.  Patient is afebrile with no leukocytosis, however Gregory Tanner is immunocompromise on mycophenolate.  Given recent hospitalization, Gregory Tanner is certainly at risk for hospital-acquired pneumonia.  No clear infiltrates on chest x-ray so this may be viral in nature.  BMP was obtained on admission and essentially unchanged to BMP obtained 6 days ago which would point against heart failure exacerbation, however there is vascular congestion on chest x-ray.  Ultimately, patient will need further imaging however this is limited by significant baseline orthopnea.  - Continue supplemental oxygen as needed to maintain oxygen saturation above 92%.  Wean as tolerated - Start Tessalon Perles 200 mg 3 times daily - Start IV Lasix 80 mg twice daily - Plan on CT chest wo contrast once patient is able - Respiratory viral panel pending - Hold further antibiotics pending further work-up.  Low threshold to start broad-spectrum antibiotics if development of fever or leukocytosis  # Right-sided Heart Failure # HFpEF  Initial history of heart failure started as right-sided only with extensive work-up in December 2021 at [facility] at which time Gregory Tanner was also diagnosed with pulmonary hypertension.  RHC at the time demonstrated elevated pulmonary pressure with high-output heart failure and a 3:1 cardiac shunt.  Cardiac MRI demonstrated severely dilated RV with reduced RVEF of 38%.  No evidence of clinically significant anatomical shunt or infiltrative disease noted on cMRI. TTE negative for platypnea-orthodeoxia. Further workup regarding high output HF with abdominal MRI negative for any abdominal shunt or fistula. With recent admission from 10/09 - 10/20, there is evidence of  left-sided failure although with preserved EF.   At this time, there is evidence of hypervolemia on examination, but it is difficult to discern if this is far from his baseline. Discharge weight of 86.3 kg; currently weight pending. Will diuresis in attempt to improve respiratory distress.   - Start IV Lasix 80 mg twice daily - Consider metolazone if inadequate diuresis - Restart home spironolactone - We will consider heart failure consult given complicated history and uncertain etiology of current respiratory distress.  Ultimately, patient may benefit from a repeat right heart cath - Continue home hydralazine  # Pulmonary Hypertension  Etiology uncertain however suspect portopulmonary hypertension given known portal hypertension with splenomegaly, ascites and enlarged venous vasculature seen on abdominal MRI (03/2020).  Previously was referred to the pulmonary hypertension clinic by [facility], however has not followed up yet.  # CKD Stage 3b  Creatinine improved compared to several days prior. Baseline between 2.2 - 2.5.   - Continue to trend renal function while diuresing - Strict ins and outs  # Cryptogenic Cirrhosis c/b HCC s/p transplant (2013) - No necessity for paracentesis at this time - Continue home mycophenolate  # Insulin-Dependent Type 2 Diabetes Mellitus  - A1c in the a.m. - SSI  Diet: Heart Healthy/carb modified VTE: Enoxaparin IVF: None,None Code: Full  Prior to Admission Living Arrangement: Home, living with his wife Anticipated Discharge Location:  TBD Barriers to Discharge: Continued medical stabilization and evaluation  Dispo: Admit patient to Observation with expected length of stay less than 2 midnights.  Signed: Dr. Jose Persia Internal Medicine PGY-3  Pager: [phone] After 5pm on weekdays and 1pm on weekends: On Call pager [phone]  12/05/2020, 9:38 AM

## 2020-12-05 NOTE — ED Triage Notes (Signed)
Pt brought in by EMS for shortness of breath. Pt reports he was recently discharged for same. Pt has a history of CHF.

## 2020-12-05 NOTE — ED Notes (Signed)
Ambulatory to restroom without difficulty.

## 2020-12-05 NOTE — ED Notes (Signed)
MD notified about tylenol dose given early due to patient c/o headache.

## 2020-12-05 NOTE — ED Notes (Signed)
Pt wanted to wait 5 min to go up because he want to eat first

## 2020-12-05 NOTE — ED Notes (Signed)
Per verbal order from Revonda Humphrey, MD staff can use patient's glucose checker as value for CBG

## 2020-12-06 LAB — COMPREHENSIVE METABOLIC PANEL
ALT: 17 U/L (ref 0–44)
AST: 22 U/L (ref 15–41)
Albumin: 4.5 g/dL (ref 3.5–5.0)
Alkaline Phosphatase: 63 U/L (ref 38–126)
Anion gap: 17 — ABNORMAL HIGH (ref 5–15)
BUN: 113 mg/dL — ABNORMAL HIGH (ref 8–23)
CO2: 26 mmol/L (ref 22–32)
Calcium: 9.4 mg/dL (ref 8.9–10.3)
Chloride: 82 mmol/L — ABNORMAL LOW (ref 98–111)
Creatinine, Ser: 2.23 mg/dL — ABNORMAL HIGH (ref 0.61–1.24)
GFR, Estimated: 32 mL/min — ABNORMAL LOW (ref >60)
Glucose, Bld: 118 mg/dL — ABNORMAL HIGH (ref 70–99)
Potassium: 3.3 mmol/L — ABNORMAL LOW (ref 3.5–5.1)
Sodium: 125 mmol/L — ABNORMAL LOW (ref 135–145)
Total Bilirubin: 1.6 mg/dL — ABNORMAL HIGH (ref 0.3–1.2)
Total Protein: 7.1 g/dL (ref 6.5–8.1)

## 2020-12-06 LAB — HIV ANTIBODY (ROUTINE TESTING W REFLEX): HIV Screen 4th Generation wRfx: NONREACTIVE

## 2020-12-06 LAB — HEMOGLOBIN A1C
Hgb A1c MFr Bld: 5.2 % (ref 4.8–5.6)
Mean Plasma Glucose: 102.54 mg/dL

## 2020-12-06 LAB — CBC WITH DIFFERENTIAL/PLATELET
Abs Immature Granulocytes: 0.07 10*3/uL (ref 0.00–0.07)
Basophils Absolute: 0 10*3/uL (ref 0.0–0.1)
Basophils Relative: 0 %
Eosinophils Absolute: 0.1 10*3/uL (ref 0.0–0.5)
Eosinophils Relative: 1 %
HCT: 33.2 % — ABNORMAL LOW (ref 39.0–52.0)
Hemoglobin: 11.1 g/dL — ABNORMAL LOW (ref 13.0–17.0)
Immature Granulocytes: 1 %
Lymphocytes Relative: 8 %
Lymphs Abs: 0.6 10*3/uL — ABNORMAL LOW (ref 0.7–4.0)
MCH: 30.3 pg (ref 26.0–34.0)
MCHC: 33.4 g/dL (ref 30.0–36.0)
MCV: 90.7 fL (ref 80.0–100.0)
Monocytes Absolute: 0.7 10*3/uL (ref 0.1–1.0)
Monocytes Relative: 9 %
Neutro Abs: 6.5 10*3/uL (ref 1.7–7.7)
Neutrophils Relative %: 81 %
Platelets: 132 10*3/uL — ABNORMAL LOW (ref 150–400)
RBC: 3.66 MIL/uL — ABNORMAL LOW (ref 4.22–5.81)
RDW: 17.1 % — ABNORMAL HIGH (ref 11.5–15.5)
WBC: 7.9 10*3/uL (ref 4.0–10.5)
nRBC: 0 % (ref 0.0–0.2)

## 2020-12-06 LAB — GLUCOSE, CAPILLARY
Glucose-Capillary: 133 mg/dL — ABNORMAL HIGH (ref 70–99)
Glucose-Capillary: 120 mg/dL — ABNORMAL HIGH (ref 70–99)
Glucose-Capillary: 116 mg/dL — ABNORMAL HIGH (ref 70–99)
Glucose-Capillary: 136 mg/dL — ABNORMAL HIGH (ref 70–99)

## 2020-12-06 LAB — MAGNESIUM: Magnesium: 3 mg/dL — ABNORMAL HIGH (ref 1.7–2.4)

## 2020-12-06 LAB — PROTIME-INR
INR: 1.3 — ABNORMAL HIGH (ref 0.8–1.2)
Prothrombin Time: 15.7 seconds — ABNORMAL HIGH (ref 11.4–15.2)

## 2020-12-06 MED ORDER — BUMETANIDE 2 MG PO TABS
3.0000 mg | ORAL_TABLET | Freq: Two times a day (BID) | ORAL | Status: DC
  Administered 2020-12-06 – 2020-12-07 (×3): 3 mg via ORAL
  Filled 2020-12-06 (×4): qty 1

## 2020-12-06 MED ORDER — POTASSIUM CHLORIDE CRYS ER 20 MEQ PO TBCR
40.0000 meq | EXTENDED_RELEASE_TABLET | Freq: Two times a day (BID) | ORAL | Status: DC
  Administered 2020-12-06 – 2020-12-09 (×8): 40 meq via ORAL
  Filled 2020-12-06 (×8): qty 2

## 2020-12-06 MED ORDER — GUAIFENESIN-CODEINE 100-10 MG/5ML PO SOLN
10.0000 mL | Freq: Four times a day (QID) | ORAL | Status: DC | PRN
  Administered 2020-12-06 – 2020-12-08 (×8): 10 mL via ORAL
  Filled 2020-12-06 (×8): qty 10

## 2020-12-06 MED ORDER — METOLAZONE 5 MG PO TABS: 5.0000 mg | ORAL_TABLET | ORAL | Status: DC

## 2020-12-06 MED ORDER — METHOCARBAMOL 500 MG PO TABS
500.0000 mg | ORAL_TABLET | Freq: Three times a day (TID) | ORAL | Status: DC | PRN
  Administered 2020-12-06: 500 mg via ORAL
  Filled 2020-12-06: qty 1

## 2020-12-06 NOTE — Progress Notes (Signed)
Subjective: Patient evaluated at bedside. Continues to have a cough producing greenish sputum. Reports being very tired due to having difficulty resting with cough. Tried mucinex syrup that caused throat irritation. Is unsure if Gregory Tanner has been helpful. Notes he is having headache and neck pain. Otherwise feels SOB with cough but well at rest. Feels oxygen is helpful when coughing. Discussed cough is likely due to RSV infection. He recalls conversation with provider about this yesterday afternoon.   Objective:  Vital signs in last 24 hours: Vitals:   12/05/20 1925 12/06/20 0042 12/06/20 0514 12/06/20 0817  BP: (!) 144/51 (!) 111/53 (!) 114/50 (!) 119/59  Pulse: 92 87 91 92  Resp: (!) 21 19 19 19   Temp: 97.9 F (36.6 C) 98.2 F (36.8 C) (!) 97.5 F (36.4 C) 97.9 F (36.6 C)  TempSrc: Oral Oral Oral Oral  SpO2: 95% 97% 97% 97%  Weight:   83.5 kg   Height:       Constitutional: Ill appearing, coughing intermittently throughout exam HENT: Normocephalic and atraumatic, EOMI, conjunctiva normal Cardiovascular: Normal rate, regular rhythm, S1 and S2 present, no murmurs, rubs, gallops.  Distal pulses intact Respiratory: tachypnea. Increased work of breathing. No accessory muscle usage, Mild scattered wheezing bilaterally GI:  Distended, nontender to palpation, normal active bowel sounds, abdominal bruit Musculoskeletal: Normal bulk and tone.  2+bilateral pitting edema bilaterally with chronic venous stasis changes R>L Neurological: Is alert and oriented x4, no apparent focal deficits noted. Skin: Warm and dry.  No jaundice Psychiatric: Normal mood and affect. Behavior is normal. Judgment and thought content normal.    Assessment/Plan:  Active Problems:   Acute respiratory distress  Gregory Tanner is a 65 year old gentleman with a past medical history of right-sided heart failure with high cardiac output, pulmonary hypertension of unknown etiology, cryptogenic cirrhosis  c/b HCC s/p transplant on mycophenolate, IDDM, CKD Stage 3b who presents to the ED with complaints of shortness of breath with cough currently admitted for acute hypoxic respiratory failure with respiratory distress.   # Acute Hypoxic Respiratory Failure  # Acute Respiratory Distress  # RSV Increase shortness of breath onset about 8 days ago. Continues to have significant cough on exam. Saturation of 97% on 3 L [place]. Respiratory viral panel positive for RSV. He remains afebrile with no leukocytosis. Patient with history of liver transplant in 2013 and on mycophenolate may be at increased risk or more severe infection. CT chest on 10/22 negative for pleural effusion, consolidation, or pulmonary edema. Given he does not have a history of lung or cardiac transplant and time since symptom onset unlikely to benefit from antivirals. Will continue supportive care at this time. - Continue tessalon Perles 200 mg 3 times daily -- Guaifenesin-codeine PRN for cough - Supplemental O2 as needed to maintain saturations >92%. Wean as tolerated   # Right-sided Heart Failure # HFpEF  Has had extensive work up at [facility], for detail see H&P. Weight is below his baseline although he appears hypervolemic on exam. Weight is 83.5 kg today with 1.5L next negative UOP. We will resume home diuretics at this time.  - Bumex 3 mg twice daily - Metolazone Monday, Wednesday, Friday, Saturday - Continue home spironolactone -- Monitory I?O, weights - may benefit from a repeat right heart cath as outpatient  # CKD Stage 3b  Creatinine stable at 2.23. Baseline between 2.2 - 2.5.  - Monitor renal function - Strict I/Os   # Cryptogenic Cirrhosis c/b HCC s/p transplant (2013) -  Continue mycophenolate 1,000 mg twice daily   # Insulin-Dependent Type 2 Diabetes Mellitus  A1c 5.2, CBGs well controlled - SSI   # Pulmonary Hypertension  Etiology uncertain. MRI in February with portal hypertension with splenomegaly, ascites and  enlarged venous vasculature suggestive of portopulmonary hypertension.   - Will need outpatient follow up with pulmonary hypertension clinic at [facility] (previously referred, but patient has not arranged an appointment)  #Thoracic Aortic aneurysm 4.0 cm aneurysm of the ascending aorta. Incidental finding on CT chest on 10/22.  -Annual CTA/MRA for surveillance   Diet: Heart Healthy/carb modified VTE: Enoxaparin IVF: None Code: Full   Prior to Admission Living Arrangement: Home, living with his wife Anticipated Discharge Location:  Home Barriers to Discharge: Continued medical stabilization and evaluation Dispo: Anticipated discharge in approximately 1-2 day(s).   Gregory Beard, MD 12/06/2020, 9:14 AM Pager: [phone] After 5pm on weekdays and 1pm on weekends: On Call pager [phone]

## 2020-12-07 DIAGNOSIS — B974 Respiratory syncytial virus as the cause of diseases classified elsewhere: Secondary | ICD-10-CM | POA: Diagnosis present

## 2020-12-07 DIAGNOSIS — Z8505 Personal history of malignant neoplasm of liver: Secondary | ICD-10-CM | POA: Diagnosis not present

## 2020-12-07 DIAGNOSIS — J9601 Acute respiratory failure with hypoxia: Secondary | ICD-10-CM | POA: Diagnosis present

## 2020-12-07 DIAGNOSIS — E875 Hyperkalemia: Secondary | ICD-10-CM | POA: Diagnosis present

## 2020-12-07 DIAGNOSIS — G894 Chronic pain syndrome: Secondary | ICD-10-CM | POA: Diagnosis present

## 2020-12-07 DIAGNOSIS — K7469 Other cirrhosis of liver: Secondary | ICD-10-CM | POA: Diagnosis present

## 2020-12-07 DIAGNOSIS — I50810 Right heart failure, unspecified: Secondary | ICD-10-CM

## 2020-12-07 DIAGNOSIS — Z20822 Contact with and (suspected) exposure to covid-19: Secondary | ICD-10-CM | POA: Diagnosis present

## 2020-12-07 DIAGNOSIS — E871 Hypo-osmolality and hyponatremia: Secondary | ICD-10-CM | POA: Diagnosis present

## 2020-12-07 DIAGNOSIS — I4510 Unspecified right bundle-branch block: Secondary | ICD-10-CM | POA: Diagnosis present

## 2020-12-07 DIAGNOSIS — I130 Hypertensive heart and chronic kidney disease with heart failure and stage 1 through stage 4 chronic kidney disease, or unspecified chronic kidney disease: Secondary | ICD-10-CM | POA: Diagnosis present

## 2020-12-07 DIAGNOSIS — R188 Other ascites: Secondary | ICD-10-CM | POA: Diagnosis present

## 2020-12-07 DIAGNOSIS — Z7982 Long term (current) use of aspirin: Secondary | ICD-10-CM | POA: Diagnosis not present

## 2020-12-07 DIAGNOSIS — Z794 Long term (current) use of insulin: Secondary | ICD-10-CM | POA: Diagnosis not present

## 2020-12-07 DIAGNOSIS — I5033 Acute on chronic diastolic (congestive) heart failure: Secondary | ICD-10-CM | POA: Diagnosis present

## 2020-12-07 DIAGNOSIS — I509 Heart failure, unspecified: Secondary | ICD-10-CM | POA: Diagnosis not present

## 2020-12-07 DIAGNOSIS — N1832 Chronic kidney disease, stage 3b: Secondary | ICD-10-CM | POA: Diagnosis present

## 2020-12-07 DIAGNOSIS — I7121 Aneurysm of the ascending aorta, without rupture: Secondary | ICD-10-CM | POA: Diagnosis present

## 2020-12-07 DIAGNOSIS — I5082 Biventricular heart failure: Secondary | ICD-10-CM | POA: Diagnosis present

## 2020-12-07 DIAGNOSIS — I361 Nonrheumatic tricuspid (valve) insufficiency: Secondary | ICD-10-CM | POA: Diagnosis not present

## 2020-12-07 DIAGNOSIS — E1122 Type 2 diabetes mellitus with diabetic chronic kidney disease: Secondary | ICD-10-CM | POA: Diagnosis present

## 2020-12-07 DIAGNOSIS — Z79899 Other long term (current) drug therapy: Secondary | ICD-10-CM | POA: Diagnosis not present

## 2020-12-07 DIAGNOSIS — Z8249 Family history of ischemic heart disease and other diseases of the circulatory system: Secondary | ICD-10-CM | POA: Diagnosis not present

## 2020-12-07 DIAGNOSIS — Z833 Family history of diabetes mellitus: Secondary | ICD-10-CM | POA: Diagnosis not present

## 2020-12-07 DIAGNOSIS — E1151 Type 2 diabetes mellitus with diabetic peripheral angiopathy without gangrene: Secondary | ICD-10-CM | POA: Diagnosis present

## 2020-12-07 DIAGNOSIS — Z944 Liver transplant status: Secondary | ICD-10-CM | POA: Diagnosis not present

## 2020-12-07 DIAGNOSIS — I868 Varicose veins of other specified sites: Secondary | ICD-10-CM | POA: Diagnosis present

## 2020-12-07 LAB — HEPATIC FUNCTION PANEL
ALT: 17 U/L (ref 0–44)
AST: 23 U/L (ref 15–41)
Albumin: 4.7 g/dL (ref 3.5–5.0)
Alkaline Phosphatase: 70 U/L (ref 38–126)
Bilirubin, Direct: 0.3 mg/dL — ABNORMAL HIGH (ref 0.0–0.2)
Indirect Bilirubin: 0.9 mg/dL (ref 0.3–0.9)
Total Bilirubin: 1.2 mg/dL (ref 0.3–1.2)
Total Protein: 7 g/dL (ref 6.5–8.1)

## 2020-12-07 LAB — CBC
HCT: 31.7 % — ABNORMAL LOW (ref 39.0–52.0)
Hemoglobin: 10.4 g/dL — ABNORMAL LOW (ref 13.0–17.0)
MCH: 29.7 pg (ref 26.0–34.0)
MCHC: 32.8 g/dL (ref 30.0–36.0)
MCV: 90.6 fL (ref 80.0–100.0)
Platelets: 120 10*3/uL — ABNORMAL LOW (ref 150–400)
RBC: 3.5 MIL/uL — ABNORMAL LOW (ref 4.22–5.81)
RDW: 17 % — ABNORMAL HIGH (ref 11.5–15.5)
WBC: 6.6 10*3/uL (ref 4.0–10.5)
nRBC: 0 % (ref 0.0–0.2)

## 2020-12-07 LAB — BASIC METABOLIC PANEL
Anion gap: 15 (ref 5–15)
Anion gap: 14 (ref 5–15)
BUN: 129 mg/dL — ABNORMAL HIGH (ref 8–23)
BUN: 130 mg/dL — ABNORMAL HIGH (ref 8–23)
CO2: 26 mmol/L (ref 22–32)
CO2: 28 mmol/L (ref 22–32)
Calcium: 9.1 mg/dL (ref 8.9–10.3)
Calcium: 9.1 mg/dL (ref 8.9–10.3)
Chloride: 84 mmol/L — ABNORMAL LOW (ref 98–111)
Chloride: 81 mmol/L — ABNORMAL LOW (ref 98–111)
Creatinine, Ser: 2.65 mg/dL — ABNORMAL HIGH (ref 0.61–1.24)
Creatinine, Ser: 2.42 mg/dL — ABNORMAL HIGH (ref 0.61–1.24)
GFR, Estimated: 26 mL/min — ABNORMAL LOW (ref >60)
GFR, Estimated: 29 mL/min — ABNORMAL LOW (ref >60)
Glucose, Bld: 147 mg/dL — ABNORMAL HIGH (ref 70–99)
Glucose, Bld: 137 mg/dL — ABNORMAL HIGH (ref 70–99)
Potassium: 3.5 mmol/L (ref 3.5–5.1)
Potassium: 3.4 mmol/L — ABNORMAL LOW (ref 3.5–5.1)
Sodium: 125 mmol/L — ABNORMAL LOW (ref 135–145)
Sodium: 123 mmol/L — ABNORMAL LOW (ref 135–145)

## 2020-12-07 LAB — GLUCOSE, CAPILLARY
Glucose-Capillary: 115 mg/dL — ABNORMAL HIGH (ref 70–99)
Glucose-Capillary: 132 mg/dL — ABNORMAL HIGH (ref 70–99)
Glucose-Capillary: 141 mg/dL — ABNORMAL HIGH (ref 70–99)

## 2020-12-07 MED ORDER — IPRATROPIUM-ALBUTEROL 0.5-2.5 (3) MG/3ML IN SOLN
RESPIRATORY_TRACT | Status: AC
  Administered 2020-12-07: 3 mL
  Filled 2020-12-07: qty 3

## 2020-12-07 MED ORDER — TOLVAPTAN 15 MG PO TABS
15.0000 mg | ORAL_TABLET | Freq: Once | ORAL | Status: AC
  Administered 2020-12-07: 15 mg via ORAL
  Filled 2020-12-07: qty 1

## 2020-12-07 MED ORDER — LIP MEDEX EX OINT
1.0000 "application " | TOPICAL_OINTMENT | CUTANEOUS | Status: DC | PRN
  Filled 2020-12-07: qty 7

## 2020-12-07 MED ORDER — IPRATROPIUM-ALBUTEROL 0.5-2.5 (3) MG/3ML IN SOLN
3.0000 mL | Freq: Four times a day (QID) | RESPIRATORY_TRACT | Status: DC
  Administered 2020-12-07 (×2): 3 mL via RESPIRATORY_TRACT
  Filled 2020-12-07: qty 3

## 2020-12-07 MED ORDER — FUROSEMIDE 10 MG/ML IJ SOLN
80.0000 mg | Freq: Two times a day (BID) | INTRAMUSCULAR | Status: AC
  Administered 2020-12-07 – 2020-12-10 (×7): 80 mg via INTRAVENOUS
  Filled 2020-12-07 (×7): qty 8

## 2020-12-07 MED ORDER — PHENOL 1.4 % MT LIQD
1.0000 | OROMUCOSAL | Status: DC | PRN
  Administered 2020-12-10: 1 via OROMUCOSAL
  Filled 2020-12-07: qty 177

## 2020-12-07 MED ORDER — ALUM & MAG HYDROXIDE-SIMETH 200-200-20 MG/5ML PO SUSP
30.0000 mL | ORAL | Status: DC | PRN
  Administered 2020-12-07: 30 mL via ORAL
  Filled 2020-12-07: qty 30

## 2020-12-07 MED ORDER — IPRATROPIUM-ALBUTEROL 0.5-2.5 (3) MG/3ML IN SOLN
3.0000 mL | Freq: Two times a day (BID) | RESPIRATORY_TRACT | Status: DC
  Administered 2020-12-08: 3 mL via RESPIRATORY_TRACT
  Filled 2020-12-07: qty 3

## 2020-12-07 NOTE — Consult Note (Addendum)
Advanced Heart Failure Team Consult Note   Primary Physician: Ronita Hipps, MD PCP-Cardiologist:  Shirlee More, MD [facility]  Nephrology: Dr Moshe Cipro Liver Transplant : Coordinator Levada Schilling [phone] Dr Loletha Grayer   Reason for Consultation: Heart Failure   HPI:    Gregory Tanner is seen today for evaluation of heart failure at the request of Dr Dareen Piano.   Gregory Tanner is a 65 year old with a history of cryptogenic cirrhosis complicated by hepatocellular carcinoma,  s/p liver transplant 2013, HTN, CKD  Stage IIIb (creatinine baseline ~2.5), severe TR, and  high output R heart failure.   Echo 11/2019 - EF 55% LA /RA severely enlarged, RV moderately enlarged, moderate TR   Had RHC 01/2020 elevated filling pressures, RV>LV with very high cardiac output -- shunt run suggests atrial level shunt 3:1 -- unable to wedge in hepatic vein with inflation.  RA 20, PA 70/25(43) , PCWP 15, high output. CMRI recommended to assess for shunt.03/2020 MRI abd/pelvis- no evidence of arteriovenous shunt.   Last seen in Liver Transplant clinic 05/18/2020 FK506 3. At that time he had lengthy discussion with the team about immunosuppression and that he was having tremors and kidney dysfunction. Marland Kitchen He ultimately elected to stop tacrolimus and continue cellcept. Cellcept was increased to 1000 mg twice a day.   Established with cardiology  Dr Sharol Roussel, [facility] 07/20/2020. He was stable on bumex, metolazone, spiro, and hydralazine. Intolerant jardiance due perineal sores.   Admitted 93/08/1694 with A/C diastolic heart failure. Had paracentesis 11/30/20 with 1.9 liters removed. Diuresed with IV lasix and transitioned to bumex 3 mg twice a day and metolazone Mon-Wed-Fri-Sat. Also treated with antibiotics for acute bronchitis. Discharged on 12/03/20 and weight was 190 pounds.   Says he developed a cough in the hospital. He returned to the hospital the next day. Admitted 78/93/81 with A/C diastolic heart  failure. CXR pulmonary congestion. CT chest AAA 4 cm, cardiomegaly, and coronary artery calcifications. Pertinent admission labs included: BNP 237, HS Trop 39>39, SARS2 negative, +RSV, creatinine 2.3,  sodium 125, and hgb 10.5. Placed on IV lasix 80 mg twice a day. Yesterday switched to bumex 3 mg twice a day.  Over the last 24 hours  -->negative  2.5 liters.   Remains SOB at rest. Complaining of cough.   Echo 11/23/2020 EF 65% Grade II DD, RVSP 61, LA and RA severely dilated, moderate TR.  Echo 2021 EF 60-65% LA mild- mod dilated, RA 15, RV normal   Review of Systems: [y] = yes, [ ]  = no   General: Weight gain [ ] ; Weight loss [ Y]; Anorexia [ ] ; Fatigue [ Y]; Fever [ ] ; Chills [ ] ; Weakness [ ] Y  Cardiac: Chest pain/pressure [ ] ; Resting SOB [ Y]; Exertional SOB [ Y]; Orthopnea [ Y]; Pedal Edema [ Y]; Palpitations [ ] ; Syncope [ ] ; Presyncope [ ] ; Paroxysmal nocturnal dyspnea[ ]   Pulmonary: Cough [ Y]; Wheezing[ ] ; Hemoptysis[ ] ; Sputum [ ] ; Snoring [ ]   GI: Vomiting[ ] ; Dysphagia[ ] ; Melena[ ] ; Hematochezia [ ] ; Heartburn[ ] ; Abdominal pain [ ] ; Constipation [ ] ; Diarrhea [ ] ; BRBPR [ ]   GU: Hematuria[ ] ; Dysuria [ ] ; Nocturia[ ]   Vascular: Pain in legs with walking [ ] ; Pain in feet with lying flat [ ] ; Non-healing sores [ ] ; Stroke [ ] ; TIA [ ] ; Slurred speech [ ] ;  Neuro: Headaches[ ] ; Vertigo[ ] ; Seizures[ ] ; Paresthesias[ ] ;Blurred vision [ ] ; Diplopia [ ] ; Vision changes [ ]   Ortho/Skin: Arthritis [ ] ; Joint pain [ Y]; Muscle pain [ ] ; Joint swelling [ ] ; Back Pain [ ] ; Rash [ ]   Psych: Depression[ ] ; Anxiety[ ]   Heme: Bleeding problems [ ] ; Clotting disorders [ ] ; Anemia [ ]   Endocrine: Diabetes [ Y]; Thyroid dysfunction[ ]   Home Medications Prior to Admission medications   Medication Sig Start Date End Date Taking? Authorizing Provider  allopurinol (ZYLOPRIM) 300 MG tablet Take 150 mg by mouth daily.  08/12/16  Yes [provider]  aspirin 81 MG tablet Take 81 mg by mouth  daily.   Yes [provider]  azithromycin (ZITHROMAX) 250 MG tablet Please take 2 tablets on day 1, then take 1 tablet once a day on days 2-5. 12/03/20  Yes [name], MD  bumetanide (BUMEX) 2 MG tablet Take 1.5 tablets (3 mg total) by mouth See admin instructions. Take 1.5 tablets (3 mg total ) by mouth daily in the morning, and 1.5 tablets (3 mg total) by mouth daily in the evening. Patient taking differently: Take 3 mg by mouth 2 (two) times daily. 12/03/20  Yes [name], MD  Continuous Blood Gluc Receiver (DEXCOM G6 RECEIVER) DEVI Use 1 each as directed 09/24/20  Yes [provider]  Continuous Blood Gluc Sensor (DEXCOM G6 SENSOR) MISC Use 1 each every 10 (ten) days 09/24/20  Yes [provider]  Continuous Blood Gluc Transmit (DEXCOM G6 TRANSMITTER) MISC Use 1 each every 3 (three) months 09/24/20  Yes [provider]  dextromethorphan-guaiFENesin (MUCINEX DM) 30-600 MG 12hr tablet Take 1 tablet by mouth 2 (two) times daily as needed for cough. Patient taking differently: Take 1 tablet by mouth 4 (four) times daily as needed for cough. 12/03/20  Yes [name], MD  gemfibrozil (LOPID) 600 MG tablet Take 0.5 tablets (300 mg total) by mouth in the morning and at bedtime. 12/03/20  Yes [name], MD  hydrALAZINE (APRESOLINE) 25 MG tablet Take 0.5-1 tablets (12.5-25 mg total) by mouth See admin instructions. Take 1 tablet (25 mg) in the morning and half a tablet (12.5 mg) before bedtime Patient taking differently: Take 12.5-25 mg by mouth See admin instructions. (25 mg) in the morning  (12.5 mg) before bedtime 12/03/20  Yes [name], MD  insulin lispro (HUMALOG) 100 UNIT/ML KwikPen Inject 20-30 Units into the skin with breakfast, with lunch, and with evening meal. 09/19/20  Yes [provider]  metolazone (ZAROXOLYN) 5 MG tablet Take 1 tablet (5 mg total) by mouth See admin instructions. Take 1 tablet by mouth  on Monday, Wednesday, Friday, and Saturday Patient taking differently: Take 5 mg by mouth See admin instructions. Monday, Wednesday, Friday, and Saturday 12/03/20  Yes [name], MD  Multiple Vitamin (MULTIVITAMIN) tablet Take 1 tablet by mouth daily.   Yes [provider]  mycophenolate (CELLCEPT) 250 MG capsule Take 4 capsules (1,000 mg total) by mouth in the morning and at bedtime. 12/03/20  Yes [name], MD  potassium chloride SA (KLOR-CON) 20 MEQ tablet Take 2 tablets (40 mEq total) by mouth 2 (two) times daily. 12/03/20  Yes [name], MD  spironolactone (ALDACTONE) 25 MG tablet Take 1 tablet (25 mg total) by mouth 2 (two) times daily. 12/03/20 01/02/21 Yes [name], MD  TART CHERRY PO Take 1 tablet by mouth in the morning and at bedtime.   Yes [provider]    Past Medical History: Past Medical History:  Diagnosis Date  Ankylosis of lumbar spine 02/21/2014   Benign essential hypertension 10/26/2015   BP (high blood pressure) 12/27/2010   Chronic diastolic heart failure ([place]) 09/26/2016   Chronic pain associated with significant psychosocial dysfunction 06/17/2015   Chronic pain disorder 10/28/2015   CKD (chronic kidney disease) stage 3, GFR 30-59 ml/min (HCC) 03/27/2017   Degeneration of intervertebral disc of lumbar region 07/05/2013   Degenerative disc disease, lumbar 07/26/2012   Diabetes mellitus without complication ([place])    Encounter for other specified prophylactic measures 10/10/2011   Excess weight 10/10/2011   Hearing loss    Heart failure, diastolic, acute on chronic ([place]) 07/24/2019   Hepatocellular carcinoma ([place]) 12/27/2010   Overview:  Embolized 6/12    History of liver transplant ([place]) 07/20/2011   Overview:  06/07/2011 for cryptogenic cirrhosis and HCC    Hypertension    Hypertensive heart disease with heart failure ([place]) 10/26/2015   Hypokalemia    Liver disease    Lumbar radiculopathy 10/10/2012   Nerve root pain  06/17/2015   Overview:  Right    SOB (shortness of breath) on exertion 10/28/2015   Swelling    Type 2 diabetes mellitus ([place]) 12/27/2010   Overview:  H1C 6.5 12/2010    Uncontrolled type 2 diabetes mellitus with insulin therapy 06/21/2018    Past Surgical History: Past Surgical History:  Procedure Laterality Date   BACK SURGERY     BAKER CYST SURGERY     RIGHT LEG   EYE SURGERY Left    HERNIA REPAIR     IR PARACENTESIS  11/30/2020   LIVER SURGERY     IMPLANT   LIVER TRANSPLANT     TONSILL SURGERY     TONSILLECTOMY      Family History: Family History  Problem Relation Age of Onset   Cancer Mother    Diabetes Father    Heart attack Father    Schizophrenia Father    Hypertension Brother     Social History: Social History   Socioeconomic History   Marital status: Married    Spouse name: Not on file   Number of children: Not on file   Years of education: Not on file   Highest education level: Not on file  Occupational History   Not on file  Tobacco Use   Smoking status: Never   Smokeless tobacco: Never  Vaping Use   Vaping Use: Never used  Substance and Sexual Activity   Alcohol use: Yes    Alcohol/week: 0.0 standard drinks    Comment: ocassional   Drug use: Not Currently   Sexual activity: Not on file  Other Topics Concern   Not on file  Social History Narrative   Not on file   Social Determinants of Health   Financial Resource Strain: Not on file  Food Insecurity: Not on file  Transportation Needs: Not on file  Physical Activity: Not on file  Stress: Not on file  Social Connections: Not on file    Allergies:  Allergies  Allergen Reactions   Iodinated Diagnostic Agents Hives and Other (See Comments)    Allergy is not to all contrast - but patient is unsure of which particular one his allergy is to. Allergy is not to all contrast - but patient is unsure of which particular one his allergy is to. Contrast dye used before liver transplant cause  hives, not sure which dye this was but is able to use others   Jardiance [Empagliflozin] Dermatitis    developed blisters  with the medication, blisters  stopped after he discontinued taking it. (About 6 months ago /~06/2020)   Latex Other (See Comments)    Skin peeling   Metformin And Related Nausea And Vomiting   Rosiglitazone Nausea And Vomiting    GI effects and abdominal pain    Objective:    Vital Signs:   Temp:  [97.7 F (36.5 C)-98.3 F (36.8 C)] 98.3 F (36.8 C) (10/24 0313) Pulse Rate:  [89-96] 89 (10/24 0313) Resp:  [18-20] 18 (10/24 0313) BP: (110-141)/(52-57) 110/52 (10/24 0313) SpO2:  [92 %-99 %] 99 % (10/24 1117) Weight:  [84.1 kg] 84.1 kg (10/24 0313)    Weight change: Filed Weights   12/05/20 0850 12/06/20 0514 12/07/20 0313  Weight: 84.4 kg 83.5 kg 84.1 kg    Intake/Output:   Intake/Output Summary (Last 24 hours) at 12/07/2020 1228 Last data filed at 12/07/2020 0659 Gross per 24 hour  Intake 480 ml  Output 2600 ml  Net -2120 ml      Physical Exam    General:  Sitting in the chair. Dyspneic talking.  HEENT: normal Neck: supple. JVP to jaw . Carotids 2+ bilat; no bruits. No lymphadenopathy or thyromegaly appreciated. Cor: PMI nondisplaced. Regular rate & rhythm. No rubs, gallops. RSB 3/6 murmurs. Lungs: Crackles on 3 liters [place] Abdomen: soft, nontender, distended. No hepatosplenomegaly. No bruits or masses. Good bowel sounds. Extremities: no cyanosis, clubbing, rash, R and LLE 2+ edema Neuro: alert & orientedx3, cranial nerves grossly intact. moves all 4 extremities w/o difficulty. Affect pleasant   Telemetry   SR 90s   EKG    SR with RBBB Labs   Basic Metabolic Panel: Recent Labs  Lab 12/02/20 0355 12/03/20 0347 12/05/20 0510 12/06/20 0707 12/07/20 0430  NA 131* 131* 125* 125* 125*  K 4.6 3.9 3.9 3.3* 3.5  CL 89* 90* 84* 82* 84*  CO2 26 24 24 26 26   GLUCOSE 126* 121* 121* 118* 147*  BUN 114* 118* 114* 113* 129*  CREATININE  2.48* 2.59* 2.29* 2.23* 2.65*  CALCIUM 9.5 9.6 10.1 9.4 9.1  MG  --   --   --  3.0*  --     Liver Function Tests: Recent Labs  Lab 11/30/20 2039 12/05/20 0510 12/06/20 0707  AST  --  30 22  ALT  --  15 17  ALKPHOS  --  66 63  BILITOT  --  1.5* 1.6*  PROT  --  6.9 7.1  ALBUMIN 4.8 4.6 4.5   No results for input(s): LIPASE, AMYLASE in the last 168 hours. No results for input(s): AMMONIA in the last 168 hours.  CBC: Recent Labs  Lab 12/02/20 0355 12/03/20 0347 12/05/20 0510 12/06/20 0707 12/07/20 0430  WBC 6.6 6.2 7.2 7.9 6.6  NEUTROABS  --   --  6.1 6.5  --   HGB 10.6* 10.4* 10.5* 11.1* 10.4*  HCT 33.5* 32.3* 31.9* 33.2* 31.7*  MCV 94.4 93.1 90.4 90.7 90.6  PLT 122* 130* 133* 132* 120*    Cardiac Enzymes: No results for input(s): CKTOTAL, CKMB, CKMBINDEX, TROPONINI in the last 168 hours.  BNP: BNP (last 3 results) Recent Labs    11/22/20 0622 11/29/20 1015 12/05/20 0510  BNP 352.1* 222.9* 237.7*    ProBNP (last 3 results) No results for input(s): PROBNP in the last 8760 hours.   CBG: Recent Labs  Lab 12/06/20 1213 12/06/20 1653 12/06/20 2125 12/07/20 0605 12/07/20 1210  GLUCAP 120* 116* 136* 115* 132*    Coagulation  Studies: Recent Labs    12/06/20 0707  LABPROT 15.7*  INR 1.3*     Imaging   No results found.   Medications:     Current Medications:  aspirin EC  81 mg Oral Daily   benzonatate  200 mg Oral TID   bumetanide  3 mg Oral BID   enoxaparin (LOVENOX) injection  30 mg Subcutaneous Q24H   hydrALAZINE  25 mg Oral q morning   And   hydrALAZINE  12.5 mg Oral QHS   insulin aspart  0-9 Units Subcutaneous TID WC   ipratropium-albuterol  3 mL Nebulization Q6H   metolazone  5 mg Oral Q M,W,F,Sa-1800   mycophenolate  1,000 mg Oral BID   potassium chloride  40 mEq Oral BID   sodium chloride flush  3 mL Intravenous Q12H   spironolactone  25 mg Oral BID    Infusions:     Patient Profile   Gregory Tanner is a 65 year old  with a history of cryptogenic cirrhosis, s/p liver transplant 2013, HTN, CKD  Stage IIIb (creatinine baseline ~2.5), severe TR, and R heart failure.   Admitted with A/C diastolic heart faiure and RSV.   Assessment/Plan   Dyspnea, multifactorial in the setting of HF/RSV RSV + Per Primary Team. Requiring supplemental  oxygen. On nebs , tessalon and guaifenesin -codeine.   2. A/C Diastolic Heart Failure --RV failure -->Moderate TR  -Had RHC 01/14/21 at [facility] with high output RA 20 {A 70/25 (43) PCWP 15 - 2nd admit this month with volume overload. He has been diuresing with IV lasix with brisk diuesis noted and was switched back to bumex. He remains volume overloaded. Will need to restart IV lasix. Stop bumex.  - Intolerant jardiance, developed wounds on buttocks  - May need RHC once diuresed.  - Place ted hose.   3. CKD Stage IIIb -Creatinine baseline ~ 2.5.  - Creatinine 2.65 today.  - He is followed by [facility], Dr Moshe Cipro.   4. Ascites -S/P paracentesis 11/30/20 with 1.9 liters removed - May need another paracentesis.   5. H/O Liver Transplant 2013 [facility] Transplant coordinator Levada Schilling [phone] --I left message for Levada Schilling.  - Tacrolimus stopped earlier this year due to tremors and worsening renal function.  - On cellcept 1000 mg twice a day.   6. Hyponatremia  -Sodium 125.  - May need  a dose of  tolvaptan. Discussed with pharmacy.   7. DMII Hgb A1C 5.2    8. PAD ABI 11/24/2020 L 0.71 moderate Toe index abnormal & R normal   Length of Stay: 0  Darrick Grinder, NP  12/07/2020, 12:28 PM  Advanced Heart Failure Team Pager [phone] (M-F; 7a - 5p)  Please contact [facility] for night-coverage after hours (4p -7a ) and weekends on [email or web address]  Patient seen with NP, agree with the above note.   Complicated history as noted above.  History of NAFLD and hepatocellular carcinoma with liver transplant.  Now with RV failure in the setting of high output HF, has  had extensive workup at [facility] with no cardiac or peripheral shunt discovered.  CKD stage III with creatinine 2.5 at baseline.   He had a recent admission here with CHF, not seen by cardiology.  Readmitted < 1 week post-discharge with wheezing and dyspnea, found to have recurrent CHF as well as RSV infection.    He has diuresed some this admission with IV Lasix, still short of breath walking to bathroom.  Na low  at 125.  BUN and creatinine up to 129/2.65.   General: NAD Neck: JVP 14+ cm, no thyromegaly or thyroid nodule.  Lungs: Decreased at bases, prolonged expiratory phase.  CV: Nondisplaced PMI.  Heart regular S1/S2, no S3/S4, 2/6 HSM LLSB.  2+ edema to knees. No carotid bruit.  Unable to palpate pedal pulses.  Abdomen: Soft, nontender, no hepatosplenomegaly, mild distention.  Skin: Intact without lesions or rashes.  Neurologic: Alert and oriented x 3.  Psych: Normal affect. Extremities: No clubbing or cyanosis.  HEENT: Normal.   1. Acute on chronic diastolic CHF with prominent RV failure: High output HF by last RHC in 12/21 at [facility].  Cardiac MRI in 12/21 at [facility] showed EF 74%, D-shaped septum, enlarged RV with RV EF 37%, Qp/Qs 1 with no evidence for significant shunt lesion, no pulmonary vein anomalies.  Echo this admission with normal EF 65-70%, moderate RV enlargement/mildly decreased RV systolic function, moderate TR.  MRI abdomen in 2/22 showed no evidence for AV shunt or fistula.   He has had extensive workup at [facility] with no definite intra- or extra-cardiac shunt lesion found to explain high output. He has struggled with RV failure, currently appears markedly volume overloaded but this is complicated by cardiorenal syndrome with markedly high BUN. Suspect recurrent ascites.  - Place unna boots (ABIs not markedly abnormal).  - Lasix 80 mg IV bid to continue, would attempt further diuresis.  - Tolvaptan 15 mg dose with low Na (carefuly with liver disease).  - Repeat RHC when more diuresed.   - Will arrange US abdomen with paracentesis if significant fluid is present.  - SGLT2 inhibitor stopped due to perineal yeast infection.  2. CKD stage 3: Creatinine around baseline at 2.65 but BUN quite high.  Suspect cardiorenal syndrome.  - Will try to push diuresis to decongest/lower renal venous pressure.  - BP stable, does not appear to need midodrine.  3. Hyponatremia: Na 125.  Hypervolemic hyponatremia.  - Fluid restrict.  - Will give 1 dose of tolvaptan.  Need to follow closely with liver dysfunction.  4. H/o liver transplant: Followed at [facility]. On mycophenolate.  Off tacrolimus due to side effects.  5. PAD: Moderate on left. Will need statin that does not interact with immunosuppression.   Loralie Champagne 12/07/2020 4:00 PM

## 2020-12-07 NOTE — Progress Notes (Addendum)
Heart Failure Navigator Progress Note  Assessed for Heart & Vascular TOC clinic readiness.  Patient does not meet criteria due to follows with [facility] for Heart Failure.   Spoke with IMTS team requesting flutter valve.   Navigator available for reassessment or education of patient.   Pricilla Holm, MSN, RN Heart Failure Nurse Navigator [phone]

## 2020-12-07 NOTE — Progress Notes (Addendum)
Subjective:  Gregory Tanner is a 65 year old with a past medical history of right-sided heart failure with high cardiac output, pulmonary hypertension of unknown etiology (maybe portopulmonary HTN), cryptogenic cirrhosis c/b HCC s/p transplant on mycophenolate, IDDM, CKD Stage 3b who is admitted for acute hypoxic respiratory failure with respiratory distress.  Patient evaluated at bedside.  Patient continues to endorse a cough, however he says that he is not coughing up sputum anymore and has been dry.  He is unsure whether the cough medicine has been helping him. Otherwise, he has no other complaints or concerns today.  Objective:  Vital signs in last 24 hours: Vitals:   12/06/20 1217 12/06/20 1700 12/06/20 1918 12/07/20 0313  BP: (!) 130/57 (!) 115/57 (!) 141/54 (!) 110/52  Pulse: 91 89 96 89  Resp: 18 18 20 18   Temp: 98 F (36.7 C) 98 F (36.7 C) 97.7 F (36.5 C) 98.3 F (36.8 C)  TempSrc: Oral Oral Oral Oral  SpO2: 94% 95% 92% 97%  Weight:    84.1 kg  Height:       Constitutional: Ill-appearing, coughing intermittently throughout exam HENT: Normocephalic and atraumatic, EOMI, conjunctiva normal Cardiovascular: Normal rate, regular rhythm, S1 and S2 present, no murmurs, rubs, gallops.  Respiratory: Normal work of breathing. No accessory muscle usage. CTAB. No wheezing/rales/rhonchi appreciated. GI:  Distended, nontender to palpation, normal active bowel sounds, abdominal bruit Musculoskeletal: Normal bulk and tone. 1+bilateral pitting edema bilaterally with chronic venous stasis changes R>L Neurological: Is alert and oriented x4, no apparent focal deficits noted. Skin: Warm and dry. Psychiatric: Normal mood and affect. Behavior is normal. Judgment and thought content normal.   Assessment/Plan:  Active Problems:   Acute respiratory distress  [name] is a 65 year old gentleman with a past medical history of right-sided heart failure with high cardiac output,  pulmonary hypertension of unknown etiology, cryptogenic cirrhosis c/b HCC s/p transplant on mycophenolate, IDDM, CKD Stage 3b who presents to the ED with complaints of shortness of breath with cough currently admitted for acute hypoxic respiratory failure with respiratory distress.   # Acute Hypoxic Respiratory Failure  # Acute Respiratory Distress  # RSV Increase shortness of breath onset about 9 days ago. Continues to have significant cough on exam. Saturation of 97% on 3 L [place]. Respiratory viral panel positive for RSV. He remains afebrile with no leukocytosis. Patient with history of liver transplant in 2013 and on mycophenolate may be at increased risk or more severe infection. CT chest on 10/22 negative for pleural effusion, consolidation, or pulmonary edema. Given he does not have a history of lung or cardiac transplant and time since symptom onset unlikely to benefit from antivirals. Will continue supportive care at this time. - Continue tessalon Perles 200 mg 3 times daily -- Guaifenesin-codeine PRN for cough - Supplemental O2 as needed to maintain saturations >92%. Wean as tolerated --Duonebs q6h   # Right-sided Heart Failure # HFpEF  Patient with a complicated heart failure picture for which he has had extensive work-up at [facility].  Please see H&P for more details. On his last admission he diuresed over 20 L of fluid and returned within 48 hours with worsening shortness of breath. He has a diagnosis of elevated pulmonary pressures with high-output heart failure and a severely dilated RV on cardiac MRI. No evidence of anatomical shunt noted on cardiac or abdominal MRI. CXR from 10/22 shows stable pulmonary vascular congestion.  Today, the patient continues to appear hypervolemic on exam. Today, output of  3.1 L, Net -4.2L since admit. Weight has been stable around 84 kg, which is lower than his dry weight of ~86 kg. Given his complex picture, we have consulted the heart failure team today for  their recommendations. Otherwise, we will continue his home diuretic regimen as below.  --Appreciate HF recommendations -- Bumex 3 mg twice daily - Metolazone Monday, Wednesday, Friday, Saturday - Continue home spironolactone -- Monitor I/O's, weights -- Flutter valve - May benefit from a repeat right heart cath as outpatient  # CKD Stage 3b  Creatinine at 2.65 today. Baseline between 2.2 - 2.5.  - Monitor renal function - Strict I/Os   # Cryptogenic Cirrhosis c/b HCC s/p transplant (2013) - Continue mycophenolate 1,000 mg twice daily   # Insulin-Dependent Type 2 Diabetes Mellitus  A1c 5.2, CBGs well controlled during this admission. - SSI  # Pulmonary Hypertension  Etiology uncertain. MRI in February with portal hypertension with splenomegaly, ascites and enlarged venous vasculature suggestive of portopulmonary hypertension.   - Will need outpatient follow up with pulmonary hypertension clinic at [facility] (previously referred, but patient has not arranged an appointment).  #Thoracic Aortic aneurysm 4.0 cm aneurysm of the ascending aorta. Incidental finding on CT chest on 10/22.  -Will need annual CTA/MRA for surveillance   Diet: Heart Healthy/carb modified VTE: Enoxaparin IVF: None Code: Full  Prior to Admission Living Arrangement: Home, living with his wife Anticipated Discharge Location:  Home Barriers to Discharge: Continued medical stabilization and evaluation Dispo: Anticipated discharge pending evaluation by HF team.  [name], MD 12/07/2020, 6:56 AM Pager: [phone] After 5pm on weekdays and 1pm on weekends: On Call pager [phone]

## 2020-12-07 NOTE — Progress Notes (Signed)
Orthopedic Tech Progress Note Patient Details:  Gregory Tanner 09-26-1955 [id number]  Ortho Devices Type of Ortho Device: Louretta Parma boot Ortho Device/Splint Location: BLE Ortho Device/Splint Interventions: Ordered, Application   Post Interventions Patient Tolerated: Well Instructions Provided: [facility] [place] 12/07/2020, 6:36 PM

## 2020-12-07 NOTE — Hospital Course (Addendum)
#Right-sided Heart Failure #HFpEF  Initial history of heart failure started as right-sided only with extensive work-up in December 2021 at [facility] at which time he was also diagnosed with pulmonary hypertension. RHC at the time demonstrated elevated pulmonary pressure with high-output heart failure and a 3:1 cardiac shunt. Cardiac MRI demonstrated severely dilated RV with reduced RVEF of 38%.  No evidence of clinically significant anatomical shunt or infiltrative disease noted on cMRI. TTE negative for platypnea-orthodeoxia. Further workup regarding high output HF with abdominal MRI negative for any abdominal shunt or fistula. With recent admission from 10/09 - 10/20, there is evidence of left-sided failure although with preserved EF.   During his hospitalization here, his volume status was monitored as well as strict I's and O's and weights.  On admit, patient was hypervolemic on exam, though this had improved since his recent discharge.  He was initially given IV Lasix 80 mg twice daily, and his home spironolactone regimen was resumed.  The following day on 10/23, patient was transitioned to his home diuretic regimen of Bumex 3 mg twice daily, metolazone Monday, Wednesday, Friday, Saturday, and spironolactone 25 mg twice daily. On 10/24, given patient's complex picture, we consulted the heart failure team. At that time, metolazone and Bumex were discontinued, and patient given 1x tolvaptan 15 mg and placed on IV Lasix 80 mg twice daily.  Unna boots were applied bilaterally given mild left PAD, no PAD on the right side. The following day, IR assessed the patient and did not find a pocket of fluid for paracentesis.  He was continued on IV Lasix at that time, and patient had output of 3 L that day with weight around 84 kg, which is below his dry weight.  The following day, we continued to follow heart failure recommendations for diuresis: IV Lasix 80 mg twice daily, given another dose of tolvaptan 15 mg.  The  following day, continued IV Lasix 80 mg twice daily as well as home spiro.  Repeat chest x-ray was obtained that day and showed stable pulmonary vascular congestion.  On 10/28, patient underwent RHC, and IV Lasix and tolvaptan were given the same day.  Results showed mildly elevated PCWP, predominantly right-sided heart failure, possible ASD.  On 10/29, patient transition to Lasix GTT at 10 mg/h.  Also given 30 mg tolvaptan again.  Heart failure team planned for TEE on Monday 10/31.  On 10/31, TEE showed No PFO or ASD noted, negative bubble study. However, could not rule out anomalous right upper pulmonary vein connecting to SVC (concerning for this in several images but not definitive).  Therefore, patient was scheduled for MRI angio chest with/without contrast, and this rule out an anomalous pulmonary vein. On 11/1, the lasix drip was discontinued and the patient was started on torsemide 60 mg bid. He will be discharged with torsemide 60 mg bid, asa 81 mg daily, atorvastatin 20 mg daily, hydralazine 25 mg q AM and 12.5 mg q PM, and Kcl 40 mEq bid.   #Acute hypoxic respiratory failure: Patient was recently admitted to our service for an acute diastolic heart failure exacerbation.  Patient was aggressively diuresed and had improvement in his lower extremity edema and was discharged home on October 28.  Patient did note worsening cough over the last 2 to 3 days prior to discharge and was started on a Z-Pak for possible acute bronchitis.  Chest x-ray done at that time showed unchanged pulmonary vascular congestion.  At the time of discharge he was at his dry  weight of approximately 191 pounds.    Patient presented ED on 10/22 with progressively worsening shortness of breath and dyspnea on exertion as well as a nonproductive cough which worsened over the last 2 days since discharge.  Patient also currently requiring supplemental oxygen with O2 sats at 96% on 3 L.  His chest x-ray shows pulmonary vascular  congestion which is unchanged from his prior one at discharge. CT chest on 10/22 negative for pleural effusion, consolidation, or pulmonary edema. Respiratory viral panel positive for RSV.  He did however remain afebrile with no leukocytosis.   Patient does have history of chronic diastolic heart failure and is noted to have an elevated BNP on admission as well as 2+ bilateral lower extremity pitting edema and JVD elevated to the mandible consistent with an acute on chronic diastolic heart failure exacerbation. However, he also has a worsening cough and shortness of breath which is not typical for his usual heart failure exacerbations (usually has evidence of right-sided heart failure but no dyspnea). His dyspnea is likely multifactorial from RSV infection and pulmonary hypertension. Given he does not have a history of lung or cardiac transplant and time since symptom onset unlikely to benefit from antivirals and therefore was continued on supportive care throughout his hospitalization.  The patient's main complaint during this hospitalization was a dry cough.  He was continued on supplemental oxygen as needed and was initially placed on duo nebs, and he endorsed fewer dry cough spells with this. On 10/26, he states that his dry cough had much improved and only endorses with exertion.  At that time, we started patient on Tussionex regimen.  On 10/27, patient endorsed no improvement with current regimen, therefore he was started on prednisone 40 mg p.o. daily.  This was continued for a total of 5 days in the hospital, and the patient endorsed significant improvement of his cough as result.  He also states that his cough improved with drinking tea with honey.    # CKD Stage 3b  Baseline between 2.2 - 2.5. Patient's creatinine levels were within baseline, and in the final days of his hospitalization, creatinine levels were actually below his baseline.    # Cryptogenic Cirrhosis c/b HCC s/p transplant  (2013) Patient was continued on his home mycophenolate 1,000 mg twice daily out his hospitalization. Recommended for close follow up with his transplant team as his RV failure is due to high output HF in the setting of his liver disease.    # Insulin-Dependent Type 2 Diabetes Mellitus  A1c 5.2, CBGs well controlled throughout his hospitalization on sliding scale insulin.   # Pulmonary Hypertension  Etiology uncertain. MRI in February with portal hypertension with splenomegaly, ascites and enlarged venous vasculature suggestive of portopulmonary hypertension.  Will need outpatient follow up with pulmonary hypertension clinic at [facility] (previously referred, but patient has not arranged an appointment).   #Thoracic Aortic aneurysm 4.0 cm aneurysm of the ascending aorta. Incidental finding on CT chest on 10/22. Patient will need annual CTA/MRA for surveillance.  #PAD Right ABI 1.04, Left ABI 0.73. Started on moderate-intensity statin that does not interact with mycophenolate. Continued atorvastatin 20 mg po daily during his hospitalization.  #Right great toe wound Patient noted to have nail loss of right great toe with excoriation over the nail area at his last admission. Wound care evaluated the patient at that time and noted scant, serous drainage on the dressing with recommended daily dressing changes. Will be discharged with home health wound care.

## 2020-12-08 ENCOUNTER — Encounter (HOSPITAL_COMMUNITY): Payer: Self-pay | Admitting: Internal Medicine

## 2020-12-08 ENCOUNTER — Inpatient Hospital Stay (HOSPITAL_COMMUNITY): Payer: Medicare Other

## 2020-12-08 DIAGNOSIS — I50810 Right heart failure, unspecified: Secondary | ICD-10-CM | POA: Diagnosis not present

## 2020-12-08 DIAGNOSIS — I509 Heart failure, unspecified: Secondary | ICD-10-CM

## 2020-12-08 LAB — HEPATIC FUNCTION PANEL
ALT: 17 U/L (ref 0–44)
AST: 23 U/L (ref 15–41)
Albumin: 4.5 g/dL (ref 3.5–5.0)
Alkaline Phosphatase: 69 U/L (ref 38–126)
Bilirubin, Direct: 0.3 mg/dL — ABNORMAL HIGH (ref 0.0–0.2)
Indirect Bilirubin: 1 mg/dL — ABNORMAL HIGH (ref 0.3–0.9)
Total Bilirubin: 1.3 mg/dL — ABNORMAL HIGH (ref 0.3–1.2)
Total Protein: 7 g/dL (ref 6.5–8.1)

## 2020-12-08 LAB — BASIC METABOLIC PANEL
Anion gap: 16 — ABNORMAL HIGH (ref 5–15)
BUN: 131 mg/dL — ABNORMAL HIGH (ref 8–23)
CO2: 26 mmol/L (ref 22–32)
Calcium: 9.2 mg/dL (ref 8.9–10.3)
Chloride: 84 mmol/L — ABNORMAL LOW (ref 98–111)
Creatinine, Ser: 2.34 mg/dL — ABNORMAL HIGH (ref 0.61–1.24)
GFR, Estimated: 30 mL/min — ABNORMAL LOW (ref >60)
Glucose, Bld: 123 mg/dL — ABNORMAL HIGH (ref 70–99)
Potassium: 3.5 mmol/L (ref 3.5–5.1)
Sodium: 126 mmol/L — ABNORMAL LOW (ref 135–145)

## 2020-12-08 LAB — CBC
HCT: 30.6 % — ABNORMAL LOW (ref 39.0–52.0)
Hemoglobin: 10.3 g/dL — ABNORMAL LOW (ref 13.0–17.0)
MCH: 30.1 pg (ref 26.0–34.0)
MCHC: 33.7 g/dL (ref 30.0–36.0)
MCV: 89.5 fL (ref 80.0–100.0)
Platelets: 113 10*3/uL — ABNORMAL LOW (ref 150–400)
RBC: 3.42 MIL/uL — ABNORMAL LOW (ref 4.22–5.81)
RDW: 17 % — ABNORMAL HIGH (ref 11.5–15.5)
WBC: 6.8 10*3/uL (ref 4.0–10.5)
nRBC: 0 % (ref 0.0–0.2)

## 2020-12-08 LAB — GLUCOSE, CAPILLARY
Glucose-Capillary: 149 mg/dL — ABNORMAL HIGH (ref 70–99)
Glucose-Capillary: 149 mg/dL — ABNORMAL HIGH (ref 70–99)
Glucose-Capillary: 117 mg/dL — ABNORMAL HIGH (ref 70–99)
Glucose-Capillary: 167 mg/dL — ABNORMAL HIGH (ref 70–99)

## 2020-12-08 LAB — SODIUM: Sodium: 127 mmol/L — ABNORMAL LOW (ref 135–145)

## 2020-12-08 MED ORDER — ATORVASTATIN CALCIUM 10 MG PO TABS
20.0000 mg | ORAL_TABLET | Freq: Every day | ORAL | Status: DC
  Administered 2020-12-08 – 2020-12-16 (×9): 20 mg via ORAL
  Filled 2020-12-08 (×9): qty 2

## 2020-12-08 MED ORDER — POTASSIUM CHLORIDE CRYS ER 20 MEQ PO TBCR
40.0000 meq | EXTENDED_RELEASE_TABLET | Freq: Once | ORAL | Status: AC
  Administered 2020-12-08: 40 meq via ORAL
  Filled 2020-12-08: qty 2

## 2020-12-08 MED ORDER — LIDOCAINE HCL 1 % IJ SOLN
INTRAMUSCULAR | Status: AC
  Filled 2020-12-08: qty 20

## 2020-12-08 MED ORDER — IPRATROPIUM-ALBUTEROL 0.5-2.5 (3) MG/3ML IN SOLN: 3.0000 mL | Freq: Four times a day (QID) | RESPIRATORY_TRACT | Status: DC | PRN

## 2020-12-08 NOTE — Progress Notes (Addendum)
Advanced Heart Failure Rounding Note  PCP-Cardiologist: Shirlee More, MD   Subjective:    3L in UOP yesterday. Wt is unchanged. SCr down, 2.24>>2.34 Na remains low but slightly improved after tolvaptan, 123>>126.   Awaiting paracentesis.   Struggling w/ persistent dry cough fits, kept him up all night.   Objective:   Weight Range: 84.1 kg Body mass index is 26.6 kg/m.   Vital Signs:   Temp:  [98.5 F (36.9 C)] 98.5 F (36.9 C) (10/25 0053) Pulse Rate:  [86-94] 86 (10/25 0053) Resp:  [18-20] 20 (10/25 0053) BP: (115-137)/(49-55) 115/49 (10/25 0053) SpO2:  [96 %-99 %] 97 % (10/25 0053) FiO2 (%):  [96 %] 96 % (10/24 1428) Weight:  [84.1 kg] 84.1 kg (10/25 0200)    Weight change: Filed Weights   12/06/20 0514 12/07/20 0313 12/08/20 0200  Weight: 83.5 kg 84.1 kg 84.1 kg    Intake/Output:   Intake/Output Summary (Last 24 hours) at 12/08/2020 0732 Last data filed at 12/08/2020 0200 Gross per 24 hour  Intake 360 ml  Output 3000 ml  Net -2640 ml      Physical Exam    General:  thin fatigued appearing WM, coughing.  HEENT: Normal Neck: Supple. Distended neck veins, elevated to jaw . Carotids 2+ bilat; no bruits. No lymphadenopathy or thyromegaly appreciated. Cor: PMI nondisplaced. Regular rate & rhythm. No rubs, gallops or murmurs. Lungs: diffuse bilateral upper lobe wheezing  Abdomen: distended, nontender, +hepatosplenomegaly. No bruits or masses. Good bowel sounds. Extremities: No cyanosis, clubbing, rash, 1+ bilateral LE edema + bilateral unna boots  Neuro: Alert & orientedx3, cranial nerves grossly intact. moves all 4 extremities w/o difficulty. Affect pleasant   Telemetry   NSR 90s   EKG    No new EKG to review   Labs    CBC Recent Labs    12/06/20 0707 12/07/20 0430 12/08/20 0008  WBC 7.9 6.6 6.8  NEUTROABS 6.5  --   --   HGB 11.1* 10.4* 10.3*  HCT 33.2* 31.7* 30.6*  MCV 90.7 90.6 89.5  PLT 132* 120* 270*   Basic Metabolic  Panel Recent Labs    12/06/20 0707 12/07/20 0430 12/07/20 1559 12/08/20 0008  NA 125*   < > 123* 126*  K 3.3*   < > 3.4* 3.5  CL 82*   < > 81* 84*  CO2 26   < > 28 26  GLUCOSE 118*   < > 137* 123*  BUN 113*   < > 130* 131*  CREATININE 2.23*   < > 2.42* 2.34*  CALCIUM 9.4   < > 9.1 9.2  MG 3.0*  --   --   --    < > = values in this interval not displayed.   Liver Function Tests Recent Labs    12/06/20 0707 12/07/20 1559  AST 22 23  ALT 17 17  ALKPHOS 63 70  BILITOT 1.6* 1.2  PROT 7.1 7.0  ALBUMIN 4.5 4.7   No results for input(s): LIPASE, AMYLASE in the last 72 hours. Cardiac Enzymes No results for input(s): CKTOTAL, CKMB, CKMBINDEX, TROPONINI in the last 72 hours.  BNP: BNP (last 3 results) Recent Labs    11/22/20 0622 11/29/20 1015 12/05/20 0510  BNP 352.1* 222.9* 237.7*    ProBNP (last 3 results) No results for input(s): PROBNP in the last 8760 hours.   D-Dimer No results for input(s): DDIMER in the last 72 hours. Hemoglobin A1C Recent Labs    12/06/20 0240  HGBA1C 5.2   Fasting Lipid Panel No results for input(s): CHOL, HDL, LDLCALC, TRIG, CHOLHDL, LDLDIRECT in the last 72 hours. Thyroid Function Tests No results for input(s): TSH, T4TOTAL, T3FREE, THYROIDAB in the last 72 hours.  Invalid input(s): FREET3  Other results:   Imaging    No results found.   Medications:     Scheduled Medications:  aspirin EC  81 mg Oral Daily   benzonatate  200 mg Oral TID   enoxaparin (LOVENOX) injection  30 mg Subcutaneous Q24H   furosemide  80 mg Intravenous BID   hydrALAZINE  25 mg Oral q morning   And   hydrALAZINE  12.5 mg Oral QHS   insulin aspart  0-9 Units Subcutaneous TID WC   ipratropium-albuterol  3 mL Nebulization BID   mycophenolate  1,000 mg Oral BID   potassium chloride  40 mEq Oral BID   potassium chloride  40 mEq Oral Once   sodium chloride flush  3 mL Intravenous Q12H   spironolactone  25 mg Oral BID    Infusions:   PRN  Medications: acetaminophen **OR** acetaminophen, alum & mag hydroxide-simeth, guaiFENesin-codeine, lip balm, methocarbamol, ondansetron (ZOFRAN) IV, phenol    Patient Profile   65 y/o male w/ h/o NAFLD and hepatocellular carcinoma with liver transplant.  Now with RV failure in the setting of high output HF, has had extensive workup at [facility] with no cardiac or peripheral shunt discovered.  CKD stage III with creatinine 2.5 at baseline.    He had a recent admission here with CHF, not seen by cardiology.  Readmitted < 1 week post-discharge with wheezing and dyspnea, found to have recurrent CHF as well as RSV infection.    Assessment/Plan    1. Acute on chronic diastolic CHF with prominent RV failure: High output HF by last RHC in 12/21 at [facility].  Cardiac MRI in 12/21 at [facility] showed EF 74%, D-shaped septum, enlarged RV with RV EF 37%, Qp/Qs 1 with no evidence for significant shunt lesion, no pulmonary vein anomalies.  Echo this admission with normal EF 65-70%, moderate RV enlargement/mildly decreased RV systolic function, moderate TR.  MRI abdomen in 2/22 showed no evidence for AV shunt or fistula.   He has had extensive workup at [facility] with no definite intra- or extra-cardiac shunt lesion found to explain high output. He has struggled with RV failure, currently appears markedly volume overloaded but this is complicated by cardiorenal syndrome with markedly high BUN. Suspect recurrent ascites.  - C/w unna boots (ABIs not markedly abnormal).  - Lasix 80 mg IV bid to continue, would attempt further diuresis.  - Repeat RHC when more diuresed.  - Will arrange US abdomen with paracentesis if significant fluid is present.  - SGLT2 inhibitor stopped due to perineal yeast infection.  2. CKD stage 3: Creatinine around baseline at 2.65 but BUN quite high.  Suspect cardiorenal syndrome.  - Will try to push diuresis to decongest/lower renal venous pressure.  - SCr down, 2.65>>2.42>>2.34 - BP stable, does not  appear to need midodrine.  3. Hyponatremia:  Hypervolemic hyponatremia.  - Tolvaptan given 10/24. Na 123>>126  - C/w diuresis  - Fluid restrict.  4. H/o liver transplant: Followed at [facility]. On mycophenolate.  Off tacrolimus due to side effects.  5. PAD: Moderate on left. Will need statin that does not interact with immunosuppression.  6. RSV: continues w/ persistent cough - per IM   Length of Stay: 1  Brittainy Simmons, PA-C  12/08/2020, 7:32 AM  Advanced Heart Failure Team Pager [phone] (M-F; 7a - 5p)  Please contact [facility] for night-coverage after hours (5p -7a ) and weekends on [email or web address]  Patient seen with PA, agree with the above note.   He diuresed well yesterday, I/Os net negative 2640.  BUN/creatinine very elevated but stable.  Main complaint remains cough.   General: NAD Neck: JVP 12 cm, no thyromegaly or thyroid nodule.  Lungs: Decreased at bases. CV: Nondisplaced PMI.  Heart regular S1/S2, no S3/S4, no murmur.  2+ edema to knees.  Abdomen: Soft, nontender, no hepatosplenomegaly, moderate distention.  Skin: Intact without lesions or rashes.  Neurologic: Alert and oriented x 3.  Psych: Normal affect. Extremities: No clubbing or cyanosis.  HEENT: Normal.   Still with volume overload from RV failure but complicated by cardiorenal syndrome.  Creatinine stable with diuresis currently though BUN very elevated.  - Will continue Lasix 80 mg IV bid today.  - Na 126 today, will give tolvaptan 15 mg again this evening (hypervolemic hyponatremia).   - Continue Unna boots.   Will arrange for abdominal US with paracentesis if there is significant ascites.   Loralie Champagne 12/08/2020 10:05 AM

## 2020-12-08 NOTE — Progress Notes (Signed)
Pt came down to IR for paracentesis. No pocket of fluid identified. No intervention performed.   Electronically Signed: Pasty Spillers 12/08/2020, 3:02 PM

## 2020-12-08 NOTE — Progress Notes (Addendum)
Subjective:  [name] is a 65 year old with a past medical history of right-sided heart failure with high cardiac output, pulmonary hypertension of unknown etiology (maybe portopulmonary HTN), cryptogenic cirrhosis c/b HCC s/p transplant on mycophenolate, IDDM, CKD Stage 3b who is admitted for acute hypoxic respiratory failure with respiratory distress.  Patient evaluated at bedside. Patient continues to endorse a dry cough. He states that the duo nebs have improved his cough in the sense that he is having less frequent spells, however the intensity of the cough is still the same. Patient says that the shortness of breath comes on with his coughing spells. He has no other complaints or concerns today.  Objective:  Vital signs in last 24 hours: Vitals:   12/07/20 1936 12/07/20 2130 12/08/20 0053 12/08/20 0200  BP: (!) 137/55  (!) 115/49   Pulse: 94  86   Resp: 18  20   Temp: 98.5 F (36.9 C)  98.5 F (36.9 C)   TempSrc: Oral     SpO2: 97% 96% 97%   Weight:    84.1 kg  Height:       I/O's: I: 360 mL O: 3 L Net -2.6 L Net -6.7 since admit Weight 84.1 kg, unchanged from yesterday  Physical Exam: Constitutional: Ill-appearing, NAD on 2L HFNC HENT: Normocephalic and atraumatic, EOMI, conjunctiva normal Cardiovascular: Normal rate, regular rhythm, S1 and S2 present, no murmurs, rubs, gallops.  Respiratory: Normal work of breathing. No accessory muscle usage. CTAB. No wheezing/rales/rhonchi appreciated. GI: Moderately distended, nontender to palpation, normal active bowel sounds, abdominal bruit Musculoskeletal: Normal bulk and tone. Unna boots intact. 2+ bilateral pitting edema bilaterally. Neurological: Is alert and oriented x4, no apparent focal deficits noted. Skin: Warm and dry. Psychiatric: Normal mood and affect. Behavior is normal. Judgment and thought content normal.    CBC Latest Ref Rng & Units 12/08/2020 12/07/2020 12/06/2020  WBC 4.0 - 10.5 K/uL 6.8 6.6 7.9   Hemoglobin 13.0 - 17.0 g/dL 10.3(L) 10.4(L) 11.1(L)  Hematocrit 39.0 - 52.0 % 30.6(L) 31.7(L) 33.2(L)  Platelets 150 - 400 K/uL 113(L) 120(L) 132(L)   BMP Latest Ref Rng & Units 12/08/2020 12/07/2020 12/07/2020  Glucose 70 - 99 mg/dL 123(H) 137(H) 147(H)  BUN 8 - 23 mg/dL 131(H) 130(H) 129(H)  Creatinine 0.61 - 1.24 mg/dL 2.34(H) 2.42(H) 2.65(H)  BUN/Creat Ratio 10 - 24 - - -  Sodium 135 - 145 mmol/L 126(L) 123(L) 125(L)  Potassium 3.5 - 5.1 mmol/L 3.5 3.4(L) 3.5  Chloride 98 - 111 mmol/L 84(L) 81(L) 84(L)  CO2 22 - 32 mmol/L 26 28 26   Calcium 8.9 - 10.3 mg/dL 9.2 9.1 9.1     Assessment/Plan:  Active Problems:   Acute exacerbation of CHF (congestive heart failure) (HCC)   Acute respiratory distress  [name] is a 65 year old gentleman with a past medical history of right-sided heart failure with high cardiac output, pulmonary hypertension of unknown etiology, cryptogenic cirrhosis c/b HCC s/p transplant on mycophenolate, IDDM, CKD Stage 3b who presents to the ED with complaints of shortness of breath with cough currently admitted for acute hypoxic respiratory failure with respiratory distress.   # Acute Hypoxic Respiratory Failure  # Acute Respiratory Distress  # RSV Increase shortness of breath onset about 10 days ago. Continues to have significant cough on exam. Saturation of 94% on 2 L [place]. Respiratory viral panel positive for RSV. He remains afebrile with no leukocytosis. Patient with history of liver transplant in 2013 and on mycophenolate may be at  increased risk or more severe infection. CT chest on 10/22 negative for pleural effusion, consolidation, or pulmonary edema. Given he does not have a history of lung or cardiac transplant and time since symptom onset unlikely to benefit from antivirals. Will continue supportive care at this time. - Continue tessalon Perles 200 mg 3 times daily -- Guaifenesin-codeine PRN for cough - Supplemental O2 as needed to maintain  saturations >92%. Wean as tolerated --Duonebs q6h   # Right-sided Heart Failure # HFpEF  Patient with a complicated heart failure picture for which he has had extensive work-up at [facility]. Likely RV failure complicated by cardiorenal syndrome. Please see H&P for more details. On his last admission he diuresed over 20 L of fluid and returned within 48 hours with worsening shortness of breath. He has a diagnosis of elevated pulmonary pressures with high-output heart failure and a severely dilated RV on cardiac MRI. No evidence of anatomical shunt noted on cardiac or abdominal MRI. CXR from 10/22 shows stable pulmonary vascular congestion.  Today, the patient continues to appear hypervolemic on exam. Na 126, reflecting hypervolemic hyponatremia. Today, output of 3L, Net -6.7L since admit. Weight has been stable around 84 kg, which is lower than his dry weight of ~86 kg. Cr has been stable, BUN continues to be elevated. Given his complex picture, we have consulted the heart failure team for their recommendations.   -- Cardiology on board, appreciate their recommendations -- Unna boots, bilateral --1500 mL fluid restriction -- IV Lasix 80 mg BID -- Tolvaptan 15 mg x1 this PM - Continue home spironolactone --Would benefit from [place] following aggressive diuresis, when able --If significant ascites noted, IR to perform abdominal US with paracentesis -- Monitor I/O's, weights -- Flutter valve  # CKD Stage 3b  Creatinine at 2.34 today. Baseline between 2.2 - 2.5.  - Monitor renal function - Strict I/Os   # Cryptogenic Cirrhosis c/b HCC s/p transplant (2013) - Continue mycophenolate 1,000 mg twice daily   # Insulin-Dependent Type 2 Diabetes Mellitus  A1c 5.2, CBGs well controlled during this admission. - SSI  # Pulmonary Hypertension  Etiology uncertain. MRI in February with portal hypertension with splenomegaly, ascites and enlarged venous vasculature suggestive of portopulmonary hypertension.    - Will need outpatient follow up with pulmonary hypertension clinic at [facility] (previously referred, but patient has not arranged an appointment).  #Thoracic Aortic aneurysm 4.0 cm aneurysm of the ascending aorta. Incidental finding on CT chest on 10/22.  -Will need annual CTA/MRA for surveillance  #PAD Right ABI 1.04, Left ABI 0.73. Started on moderate-intensity statin that does not interact with mycophenolate -Continue atorvastatin 20 mg po daily  Diet: Heart Healthy/carb modified VTE: Enoxaparin IVF: None Code: Full  Prior to Admission Living Arrangement: Home, living with his wife Anticipated Discharge Location:  Home Barriers to Discharge: Continued medical stabilization  Dispo: Anticipated discharge pending medical stabilization  [name], MD 12/08/2020, 6:15 AM Pager: [phone] After 5pm on weekdays and 1pm on weekends: On Call pager [phone]

## 2020-12-09 DIAGNOSIS — I509 Heart failure, unspecified: Secondary | ICD-10-CM | POA: Diagnosis not present

## 2020-12-09 LAB — BASIC METABOLIC PANEL
Anion gap: 13 (ref 5–15)
BUN: 123 mg/dL — ABNORMAL HIGH (ref 8–23)
CO2: 28 mmol/L (ref 22–32)
Calcium: 9.7 mg/dL (ref 8.9–10.3)
Chloride: 88 mmol/L — ABNORMAL LOW (ref 98–111)
Creatinine, Ser: 2.05 mg/dL — ABNORMAL HIGH (ref 0.61–1.24)
GFR, Estimated: 35 mL/min — ABNORMAL LOW (ref >60)
Glucose, Bld: 107 mg/dL — ABNORMAL HIGH (ref 70–99)
Potassium: 4.9 mmol/L (ref 3.5–5.1)
Sodium: 129 mmol/L — ABNORMAL LOW (ref 135–145)

## 2020-12-09 LAB — CBC
HCT: 30.3 % — ABNORMAL LOW (ref 39.0–52.0)
Hemoglobin: 10.2 g/dL — ABNORMAL LOW (ref 13.0–17.0)
MCH: 30.4 pg (ref 26.0–34.0)
MCHC: 33.7 g/dL (ref 30.0–36.0)
MCV: 90.2 fL (ref 80.0–100.0)
Platelets: 120 10*3/uL — ABNORMAL LOW (ref 150–400)
RBC: 3.36 MIL/uL — ABNORMAL LOW (ref 4.22–5.81)
RDW: 17.1 % — ABNORMAL HIGH (ref 11.5–15.5)
WBC: 6 10*3/uL (ref 4.0–10.5)
nRBC: 0 % (ref 0.0–0.2)

## 2020-12-09 LAB — COMPREHENSIVE METABOLIC PANEL
ALT: 16 U/L (ref 0–44)
AST: 23 U/L (ref 15–41)
Albumin: 4.3 g/dL (ref 3.5–5.0)
Alkaline Phosphatase: 64 U/L (ref 38–126)
Anion gap: 12 (ref 5–15)
BUN: 126 mg/dL — ABNORMAL HIGH (ref 8–23)
CO2: 26 mmol/L (ref 22–32)
Calcium: 9.1 mg/dL (ref 8.9–10.3)
Chloride: 87 mmol/L — ABNORMAL LOW (ref 98–111)
Creatinine, Ser: 2 mg/dL — ABNORMAL HIGH (ref 0.61–1.24)
GFR, Estimated: 36 mL/min — ABNORMAL LOW (ref >60)
Glucose, Bld: 156 mg/dL — ABNORMAL HIGH (ref 70–99)
Potassium: 4 mmol/L (ref 3.5–5.1)
Sodium: 125 mmol/L — ABNORMAL LOW (ref 135–145)
Total Bilirubin: 0.8 mg/dL (ref 0.3–1.2)
Total Protein: 6.7 g/dL (ref 6.5–8.1)

## 2020-12-09 LAB — SODIUM: Sodium: 129 mmol/L — ABNORMAL LOW (ref 135–145)

## 2020-12-09 LAB — GLUCOSE, CAPILLARY
Glucose-Capillary: 153 mg/dL — ABNORMAL HIGH (ref 70–99)
Glucose-Capillary: 124 mg/dL — ABNORMAL HIGH (ref 70–99)
Glucose-Capillary: 130 mg/dL — ABNORMAL HIGH (ref 70–99)
Glucose-Capillary: 141 mg/dL — ABNORMAL HIGH (ref 70–99)

## 2020-12-09 LAB — MAGNESIUM: Magnesium: 2.9 mg/dL — ABNORMAL HIGH (ref 1.7–2.4)

## 2020-12-09 LAB — HEPATIC FUNCTION PANEL
ALT: 19 U/L (ref 0–44)
AST: 25 U/L (ref 15–41)
Albumin: 4.7 g/dL (ref 3.5–5.0)
Alkaline Phosphatase: 67 U/L (ref 38–126)
Bilirubin, Direct: 0.3 mg/dL — ABNORMAL HIGH (ref 0.0–0.2)
Indirect Bilirubin: 0.7 mg/dL (ref 0.3–0.9)
Total Bilirubin: 1 mg/dL (ref 0.3–1.2)
Total Protein: 7 g/dL (ref 6.5–8.1)

## 2020-12-09 MED ORDER — TOLVAPTAN 15 MG PO TABS
15.0000 mg | ORAL_TABLET | Freq: Once | ORAL | Status: AC
  Administered 2020-12-09: 15 mg via ORAL
  Filled 2020-12-09: qty 1

## 2020-12-09 MED ORDER — POLYETHYLENE GLYCOL 3350 17 G PO PACK: 17.0000 g | PACK | Freq: Every day | ORAL | Status: DC | PRN

## 2020-12-09 MED ORDER — HYDROCOD POLST-CPM POLST ER 10-8 MG/5ML PO SUER
5.0000 mL | Freq: Two times a day (BID) | ORAL | Status: DC
  Administered 2020-12-09 – 2020-12-16 (×15): 5 mL via ORAL
  Filled 2020-12-09 (×15): qty 5

## 2020-12-09 MED ORDER — DM-GUAIFENESIN ER 30-600 MG PO TB12
1.0000 | ORAL_TABLET | Freq: Two times a day (BID) | ORAL | Status: DC | PRN
  Administered 2020-12-09 – 2020-12-13 (×5): 1 via ORAL
  Filled 2020-12-09 (×6): qty 1

## 2020-12-09 MED ORDER — DOCUSATE SODIUM 100 MG PO CAPS
100.0000 mg | ORAL_CAPSULE | Freq: Two times a day (BID) | ORAL | Status: DC
  Administered 2020-12-09 – 2020-12-16 (×15): 100 mg via ORAL
  Filled 2020-12-09 (×15): qty 1

## 2020-12-09 MED ORDER — ENOXAPARIN SODIUM 40 MG/0.4ML IJ SOSY
40.0000 mg | PREFILLED_SYRINGE | INTRAMUSCULAR | Status: DC
  Administered 2020-12-10: 40 mg via SUBCUTANEOUS
  Filled 2020-12-09: qty 0.4

## 2020-12-09 MED ORDER — TOLVAPTAN 15 MG PO TABS
15.0000 mg | ORAL_TABLET | Freq: Once | ORAL | Status: DC
  Filled 2020-12-09 (×2): qty 1

## 2020-12-09 NOTE — Consult Note (Signed)
   [facility] Inpatient Consult   12/09/2020  [name] Oct 12, 1955 [id number]  [facility] [ACO] Patient: Gregory Tanner [facility]  Patient screened for less than 7 days readmission hospitalization with noted extreme high risk score for unplanned readmission risk and to assess for potential [place] Management service needs for post hospital transition.  Review of patient's medical record reveals patient is for ongoing medical management needs, noted fatigued.   *Could benefit from PT/OT evaluation when medically ready.   Plan:  Continue to follow progress and disposition to assess for post hospital care management needs.    For questions contact:   Natividad Brood, RN BSN [facility] Liaison  [phone] business mobile phone Toll free office [phone]  Fax number: [phone] [email or web address]

## 2020-12-09 NOTE — Progress Notes (Addendum)
Advanced Heart Failure Rounding Note  PCP-Cardiologist: Shirlee More, MD   Subjective:    More fatigued today. Increased productive cough.   Leg edema improving.  Scr trending down, 2.0 today. Na 125.  Weight down 1 lb overnight. -2.4L yesterday   Objective:   Weight Range: 83.8 kg Body mass index is 26.5 kg/m.   Vital Signs:   Temp:  [97.3 F (36.3 C)-98.5 F (36.9 C)] 97.6 F (36.4 C) (10/26 0815) Pulse Rate:  [83-89] 87 (10/26 0815) Resp:  [16-20] 18 (10/26 0815) BP: (108-125)/(47-54) 125/54 (10/26 0815) SpO2:  [97 %-99 %] 97 % (10/26 0815) Weight:  [83.8 kg] 83.8 kg (10/26 0500)    Weight change: Filed Weights   12/07/20 0313 12/08/20 0200 12/09/20 0500  Weight: 84.1 kg 84.1 kg 83.8 kg    Intake/Output:   Intake/Output Summary (Last 24 hours) at 12/09/2020 1032 Last data filed at 12/09/2020 0951 Gross per 24 hour  Intake 720 ml  Output 3025 ml  Net -2305 ml      Physical Exam    General:  Thin WM, appears fatigued. Frequent coughing HEENT: Normal Neck: Supple. JVP to ear. Carotids 2+ bilat; no bruits. No lymphadenopathy or thyromegaly appreciated. Cor: PMI nondisplaced. Regular rate & rhythm. No rubs, gallops or murmurs. Lungs: Course throughout, expiratory wheezes b/l Abdomen: Distended, nontender.  Extremities: No cyanosis, clubbing, rash, 1+ ble edema. + UNNA boots Neuro: Alert & orientedx3, cranial nerves grossly intact. moves all 4 extremities w/o difficulty. Affect pleasant   Telemetry   NSR 80s-90s (Personally reviewed)  Labs    CBC Recent Labs    12/08/20 0008 12/09/20 0355  WBC 6.8 6.0  HGB 10.3* 10.2*  HCT 30.6* 30.3*  MCV 89.5 90.2  PLT 113* 626*   Basic Metabolic Panel Recent Labs    12/08/20 0008 12/08/20 0752 12/09/20 0355  NA 126* 127* 125*  K 3.5  --  4.0  CL 84*  --  87*  CO2 26  --  26  GLUCOSE 123*  --  156*  BUN 131*  --  126*  CREATININE 2.34*  --  2.00*  CALCIUM 9.2  --  9.1  MG  --   --  2.9*    Liver Function Tests Recent Labs    12/08/20 0752 12/09/20 0355  AST 23 23  ALT 17 16  ALKPHOS 69 64  BILITOT 1.3* 0.8  PROT 7.0 6.7  ALBUMIN 4.5 4.3   No results for input(s): LIPASE, AMYLASE in the last 72 hours. Cardiac Enzymes No results for input(s): CKTOTAL, CKMB, CKMBINDEX, TROPONINI in the last 72 hours.  BNP: BNP (last 3 results) Recent Labs    11/22/20 0622 11/29/20 1015 12/05/20 0510  BNP 352.1* 222.9* 237.7*    ProBNP (last 3 results) No results for input(s): PROBNP in the last 8760 hours.   D-Dimer No results for input(s): DDIMER in the last 72 hours. Hemoglobin A1C No results for input(s): HGBA1C in the last 72 hours. Fasting Lipid Panel No results for input(s): CHOL, HDL, LDLCALC, TRIG, CHOLHDL, LDLDIRECT in the last 72 hours. Thyroid Function Tests No results for input(s): TSH, T4TOTAL, T3FREE, THYROIDAB in the last 72 hours.  Invalid input(s): FREET3  Other results:   Imaging    IR ABDOMEN US LIMITED  Result Date: 12/08/2020 CLINICAL DATA:  Ascites EXAM: LIMITED ABDOMEN ULTRASOUND FOR ASCITES TECHNIQUE: Limited ultrasound survey for ascites was performed in all four abdominal quadrants. COMPARISON:  None. FINDINGS: Sonographic interrogation of the 4  quadrants of the abdomen demonstrates no drainable ascites. Paracentesis was deferred. IMPRESSION: No evidence of drainable ascites. Electronically Signed   By: Jacqulynn Cadet M.D.   On: 12/08/2020 16:14     Medications:     Scheduled Medications:  aspirin EC  81 mg Oral Daily   atorvastatin  20 mg Oral Daily   benzonatate  200 mg Oral TID   chlorpheniramine-HYDROcodone  5 mL Oral Q12H   enoxaparin (LOVENOX) injection  30 mg Subcutaneous Q24H   furosemide  80 mg Intravenous BID   hydrALAZINE  25 mg Oral q morning   And   hydrALAZINE  12.5 mg Oral QHS   insulin aspart  0-9 Units Subcutaneous TID WC   mycophenolate  1,000 mg Oral BID   potassium chloride  40 mEq Oral BID   sodium  chloride flush  3 mL Intravenous Q12H   spironolactone  25 mg Oral BID    Infusions:   PRN Medications: acetaminophen **OR** acetaminophen, alum & mag hydroxide-simeth, ipratropium-albuterol, lip balm, methocarbamol, ondansetron (ZOFRAN) IV, phenol    Patient Profile   65 y/o male w/ h/o NAFLD and hepatocellular carcinoma with liver transplant.  Now with RV failure in the setting of high output HF, has had extensive workup at [facility] with no cardiac or peripheral shunt discovered.  CKD stage III with creatinine 2.5 at baseline.    He had a recent admission here with CHF, not seen by cardiology.  Readmitted < 1 week post-discharge with wheezing and dyspnea, found to have recurrent CHF as well as RSV infection.   Assessment/Plan   1. Acute on chronic diastolic CHF with prominent RV failure: High output HF by last RHC in 12/21 at [facility].  Cardiac MRI in 12/21 at [facility] showed EF 74%, D-shaped septum, enlarged RV with RV EF 37%, Qp/Qs 1 with no evidence for significant shunt lesion, no pulmonary vein anomalies.  Echo this admission with normal EF 65-70%, moderate RV enlargement/mildly decreased RV systolic function, moderate TR.  MRI abdomen in 2/22 showed no evidence for AV shunt or fistula.   He has had extensive workup at [facility] with no definite intra- or extra-cardiac shunt lesion found to explain high output. He has struggled with RV failure, currently appears markedly volume overloaded but this is complicated by cardiorenal syndrome with markedly high BUN.  - C/w unna boots (ABIs not markedly abnormal).  - Continue Lasix 80 mg IV bid. -2.4L yesterday. Weight down 1 lb overnight. Still appears volume up. Continue IV diuresis today. ?if may benefit from metolazone once hyponatremia corrected.  BUN quite high, Scr 2. Would need to monitor closely. - Repeat RHC when more diuresed.  - US abdomen with no drainable ascites - SGLT2 inhibitor stopped due to perineal yeast infection.  - Continue spiro 25 mg  BID 2. CKD stage 3: Creatinine around baseline at 2.65 but BUN quite high.  Suspect cardiorenal syndrome.  - Will try to push diuresis to decongest/lower renal venous pressure.  - SCr down, 2.65>>2.42>>2.34>>2.00 - BP stable, does not appear to need midodrine.  3. Hyponatremia:  Hypervolemic hyponatremia.  - Tolvaptan given 10/24 and 10/25. Na 123>>126>>125.  - Give Tolvaptan again today - C/w diuresis  - Fluid restrict.  4. H/o liver transplant: Followed at [facility]. On mycophenolate.  Off tacrolimus due to side effects.  5. PAD: Moderate on left. Will need statin that does not interact with immunosuppression.  6. RSV: continues w/ persistent cough - Can add Mucinex DM.    Length  of Stay: 2  [facility], [place], PA-C  12/09/2020, 10:32 AM  Advanced Heart Failure Team Pager [phone] (M-F; 7a - 5p)  Please contact [facility] for night-coverage after hours (5p -7a ) and weekends on [email or web address]   Patient seen with PA, agree with the above note.   I/Os negative on Lasix 80 mg IV bid + tolvaptan.  Na remains low at 125.  BUN and creatinine remain high but trending down.   Abdominal US yesterday, no ascites.   Main complaint remains cough.   General: NAD Neck: JVP 10-12 cm, no thyromegaly or thyroid nodule.  Lungs: Clear to auscultation bilaterally with normal respiratory effort. CV: Nondisplaced PMI.  Heart regular S1/S2, no S3/S4, no murmur.  2+ edema to knees.  Abdomen: Soft, nontender, no hepatosplenomegaly, mild distention.  Skin: Intact without lesions or rashes.  Neurologic: Alert and oriented x 3.  Psych: Normal affect. Extremities: No clubbing or cyanosis.  HEENT: Normal.   Still with volume overload from RV failure but complicated by cardiorenal syndrome.  Creatinine coming down with diuresis though BUN still very elevated. Diuresis reasonable on current regimen.  - Will continue Lasix 80 mg IV bid today.  - Na 125 today, will give tolvaptan 15 mg again today  (hypervolemic hyponatremia).  LFTs normal.  - Continue Unna boots.   Loralie Champagne 12/09/2020 11:56 AM

## 2020-12-09 NOTE — Progress Notes (Signed)
Subjective:  [name] is a 65 year old with a past medical history of right-sided heart failure with high cardiac output, pulmonary hypertension of unknown etiology (maybe portopulmonary HTN), cryptogenic cirrhosis c/b HCC s/p transplant on mycophenolate, IDDM, CKD Stage 3b who is admitted for acute hypoxic respiratory failure with respiratory distress.  Patient evaluated at bedside. Patient continues to endorse a dry cough when he exerts himself. He does state, however, that his cough has been less frequent. He states that he slept well overnight.  He is pleased with his progress in terms of his volume status, stating that "I think this is as much fluid as you all are going to get off." No other complaints or concerns at this time.  Objective:  Vital signs in last 24 hours: Vitals:   12/08/20 1147 12/08/20 1924 12/09/20 0400 12/09/20 0500  BP: (!) 118/54 (!) 114/47 (!) 108/53   Pulse: 89 89 83   Resp: 16 20    Temp: (!) 97.3 F (36.3 C) 97.8 F (36.6 C) 98.5 F (36.9 C)   TempSrc: Oral Oral Oral   SpO2: 98% 99% 99%   Weight:    83.8 kg  Height:       I/O's: I: 720 mL O: 3.1 L Net -2.3 L Net 9.1 since admit Weight 83.8 kg from 84 kg yesterday  Physical Exam: Constitutional: Ill-appearing, coughing during exam, NAD on 2L HFNC HENT: Normocephalic and atraumatic, EOMI, conjunctiva normal Cardiovascular: Normal rate, regular rhythm, S1 and S2 present, no murmurs, rubs, gallops.  Respiratory: Normal work of breathing. No accessory muscle usage. Limited due to patient coughing during exam. GI: Moderately distended, nontender to palpation, normal active bowel sounds Musculoskeletal: Normal bulk and tone. Unna boots intact. 1-2+ bilateral pitting edema bilaterally. Neurological: Is alert and oriented x4, no apparent focal deficits noted. Skin: Warm and dry. Psychiatric: Normal mood and affect. Behavior is normal. Judgment and thought content normal.    Assessment/Plan:  Active Problems:   Acute exacerbation of CHF (congestive heart failure) (HCC)   Acute respiratory distress  [name] is a 65 year old gentleman with a past medical history of right-sided heart failure with high cardiac output, pulmonary hypertension of unknown etiology, cryptogenic cirrhosis c/b HCC s/p transplant on mycophenolate, IDDM, CKD Stage 3b who presents to the ED with complaints of shortness of breath with cough currently admitted for acute hypoxic respiratory failure with respiratory distress.   # Acute Hypoxic Respiratory Failure  # Acute Respiratory Distress  # RSV Increase shortness of breath onset about 10 days ago. Continues to have significant cough on exam. Saturation of 94% on 2 L [place]. Respiratory viral panel positive for RSV. He remains afebrile with no leukocytosis. Patient with history of liver transplant in 2013 and on mycophenolate may be at increased risk or more severe infection. CT chest on 10/22 negative for pleural effusion, consolidation, or pulmonary edema. Given he does not have a history of lung or cardiac transplant and time since symptom onset unlikely to benefit from antivirals. Will continue supportive care at this time. - Continue tessalon Perles 200 mg 3 times daily --Started Tussionex 5 mL p.o. twice daily for cough - Supplemental O2 as needed to maintain saturations >92%. Wean as tolerated --Duonebs q6h PRN   # Right-sided Heart Failure # HFpEF  Patient with a complicated heart failure picture for which he has had extensive work-up at [facility]. Likely RV failure complicated by cardiorenal syndrome. Please see H&P for more details. On his last admission he  diuresed over 20 L of fluid and returned within 48 hours with worsening shortness of breath. He has a diagnosis of elevated pulmonary pressures with high-output heart failure and a severely dilated RV on cardiac MRI. No evidence of anatomical shunt noted on cardiac or abdominal  MRI. CXR from 10/22 shows stable pulmonary vascular congestion.  Today, the patient continues to appear hypervolemic on exam. Na 125. Today, output of 3.1L, Net -9.1L since admit. Weight has been stable around 83.8 kg, which is lower than his dry weight of ~86 kg. Cr is downtrending, currently at 2, BUN continues to be elevated. Given his complex picture, we have consulted the heart failure team for their recommendations.   -- Cardiology on board, appreciate their recommendations -- Unna boots, bilateral --1500 mL fluid restriction -- IV Lasix 80 mg BID --Tolvaptan 15 mg p.o. x 1, this afternoon - Continue home spironolactone --Would benefit from [place] following aggressive diuresis, when able -- Monitor I/O's, weights -- Flutter valve  # CKD Stage 3b  Creatinine at 2 today. Baseline between 2.2 - 2.5.  - Monitor renal function - Strict I/Os   # Cryptogenic Cirrhosis c/b HCC s/p transplant (2013) - Continue mycophenolate 1,000 mg twice daily   # Insulin-Dependent Type 2 Diabetes Mellitus  A1c 5.2, CBGs well controlled during this admission. - SSI  # Pulmonary Hypertension  Etiology uncertain. MRI in February with portal hypertension with splenomegaly, ascites and enlarged venous vasculature suggestive of portopulmonary hypertension.   - Will need outpatient follow up with pulmonary hypertension clinic at [facility] (previously referred, but patient has not arranged an appointment).  #Thoracic Aortic aneurysm 4.0 cm aneurysm of the ascending aorta. Incidental finding on CT chest on 10/22.  -Will need annual CTA/MRA for surveillance  #PAD Right ABI 1.04, Left ABI 0.73. Started on moderate-intensity statin that does not interact with mycophenolate. -Continue atorvastatin 20 mg po daily  Diet: Heart Healthy/carb modified with 1500 mL fluid restriction VTE: Enoxaparin IVF: None Code: Full  Prior to Admission Living Arrangement: Home, living with his wife Anticipated Discharge Location:   Home Barriers to Discharge: Continued medical stabilization  Dispo: Anticipated discharge pending medical stabilization  Gregory Brill, MD 12/09/2020, 6:20 AM Pager: [phone] After 5pm on weekdays and 1pm on weekends: On Call pager [phone]

## 2020-12-10 ENCOUNTER — Inpatient Hospital Stay (HOSPITAL_COMMUNITY): Payer: Medicare Other

## 2020-12-10 DIAGNOSIS — I509 Heart failure, unspecified: Secondary | ICD-10-CM | POA: Diagnosis not present

## 2020-12-10 DIAGNOSIS — I5033 Acute on chronic diastolic (congestive) heart failure: Secondary | ICD-10-CM

## 2020-12-10 LAB — BASIC METABOLIC PANEL
Anion gap: 12 (ref 5–15)
BUN: 126 mg/dL — ABNORMAL HIGH (ref 8–23)
CO2: 26 mmol/L (ref 22–32)
Calcium: 9.5 mg/dL (ref 8.9–10.3)
Chloride: 88 mmol/L — ABNORMAL LOW (ref 98–111)
Creatinine, Ser: 2.22 mg/dL — ABNORMAL HIGH (ref 0.61–1.24)
GFR, Estimated: 32 mL/min — ABNORMAL LOW (ref >60)
Glucose, Bld: 127 mg/dL — ABNORMAL HIGH (ref 70–99)
Potassium: 5.1 mmol/L (ref 3.5–5.1)
Sodium: 126 mmol/L — ABNORMAL LOW (ref 135–145)

## 2020-12-10 LAB — HEPATIC FUNCTION PANEL
ALT: 19 U/L (ref 0–44)
AST: 29 U/L (ref 15–41)
Albumin: 4.7 g/dL (ref 3.5–5.0)
Alkaline Phosphatase: 65 U/L (ref 38–126)
Bilirubin, Direct: 0.3 mg/dL — ABNORMAL HIGH (ref 0.0–0.2)
Indirect Bilirubin: 0.9 mg/dL (ref 0.3–0.9)
Total Bilirubin: 1.2 mg/dL (ref 0.3–1.2)
Total Protein: 7.1 g/dL (ref 6.5–8.1)

## 2020-12-10 LAB — COMPREHENSIVE METABOLIC PANEL
ALT: 18 U/L (ref 0–44)
AST: 25 U/L (ref 15–41)
Albumin: 4.6 g/dL (ref 3.5–5.0)
Alkaline Phosphatase: 63 U/L (ref 38–126)
Anion gap: 12 (ref 5–15)
BUN: 124 mg/dL — ABNORMAL HIGH (ref 8–23)
CO2: 25 mmol/L (ref 22–32)
Calcium: 9.6 mg/dL (ref 8.9–10.3)
Chloride: 89 mmol/L — ABNORMAL LOW (ref 98–111)
Creatinine, Ser: 2.4 mg/dL — ABNORMAL HIGH (ref 0.61–1.24)
GFR, Estimated: 29 mL/min — ABNORMAL LOW (ref >60)
Glucose, Bld: 131 mg/dL — ABNORMAL HIGH (ref 70–99)
Potassium: 5.2 mmol/L — ABNORMAL HIGH (ref 3.5–5.1)
Sodium: 126 mmol/L — ABNORMAL LOW (ref 135–145)
Total Bilirubin: 1.3 mg/dL — ABNORMAL HIGH (ref 0.3–1.2)
Total Protein: 6.9 g/dL (ref 6.5–8.1)

## 2020-12-10 LAB — GLUCOSE, CAPILLARY
Glucose-Capillary: 135 mg/dL — ABNORMAL HIGH (ref 70–99)
Glucose-Capillary: 121 mg/dL — ABNORMAL HIGH (ref 70–99)
Glucose-Capillary: 106 mg/dL — ABNORMAL HIGH (ref 70–99)
Glucose-Capillary: 221 mg/dL — ABNORMAL HIGH (ref 70–99)

## 2020-12-10 LAB — SODIUM: Sodium: 125 mmol/L — ABNORMAL LOW (ref 135–145)

## 2020-12-10 MED ORDER — SODIUM CHLORIDE 0.9% FLUSH
3.0000 mL | Freq: Two times a day (BID) | INTRAVENOUS | Status: DC
Start: 2020-12-10 — End: 2020-12-16
  Administered 2020-12-10 – 2020-12-16 (×9): 3 mL via INTRAVENOUS

## 2020-12-10 MED ORDER — PREDNISONE 20 MG PO TABS: 40.0000 mg | ORAL_TABLET | Freq: Every day | ORAL | Status: DC

## 2020-12-10 MED ORDER — TOLVAPTAN 15 MG PO TABS
15.0000 mg | ORAL_TABLET | Freq: Once | ORAL | Status: AC
  Administered 2020-12-10: 15 mg via ORAL
  Filled 2020-12-10: qty 1

## 2020-12-10 MED ORDER — MENTHOL 3 MG MT LOZG
1.0000 | LOZENGE | OROMUCOSAL | Status: DC | PRN
  Filled 2020-12-10: qty 9

## 2020-12-10 MED ORDER — PREDNISONE 20 MG PO TABS
40.0000 mg | ORAL_TABLET | Freq: Every day | ORAL | Status: AC
  Administered 2020-12-10 – 2020-12-14 (×5): 40 mg via ORAL
  Filled 2020-12-10 (×5): qty 2

## 2020-12-10 MED ORDER — ENOXAPARIN SODIUM 40 MG/0.4ML IJ SOSY
40.0000 mg | PREFILLED_SYRINGE | INTRAMUSCULAR | Status: DC
  Administered 2020-12-12 – 2020-12-16 (×5): 40 mg via SUBCUTANEOUS
  Filled 2020-12-10 (×5): qty 0.4

## 2020-12-10 NOTE — Progress Notes (Signed)
Subjective:  [name] is a 65 year old with a past medical history of right-sided heart failure with high cardiac output, pulmonary hypertension of unknown etiology (maybe portopulmonary HTN), cryptogenic cirrhosis c/b HCC s/p transplant on mycophenolate, IDDM, CKD Stage 3b who is admitted for acute hypoxic respiratory failure with respiratory distress.  Patient evaluated at bedside. Patient continues to endorse a dry cough, mainly when he exerts himself. He states that the tussionex medication did not alleviate his cough. Fluctuates between worsening and improving throughout the week. No other complaints or concerns today.  Objective:  Vital signs in last 24 hours: Vitals:   12/09/20 0815 12/09/20 1609 12/09/20 1935 12/10/20 0336  BP: (!) 125/54 (!) 118/52 (!) 129/58 (!) 136/56  Pulse: 87 84 88 92  Resp: 18 19 20 20   Temp: 97.6 F (36.4 C) 97.9 F (36.6 C) 98 F (36.7 C) 97.6 F (36.4 C)  TempSrc: Oral Oral Oral Oral  SpO2: 97% 100% 98% 97%  Weight:    84.5 kg  Height:       I/O's: I: 3.3 L O: 2.5 L Net +870 mL Net -8.2 since admit Weight 84.5 kg from 83.8 kg yesterday  Physical Exam: Constitutional: Ill-appearing, coughs intermittently during exam, NAD on RA HENT: Normocephalic and atraumatic, EOMI, conjunctiva normal Cardiovascular: Normal rate, regular rhythm, S1 and S2 present, no murmurs, rubs, gallops.  Respiratory: Normal work of breathing. No accessory muscle usage. +Rales in bilateral lung bases GI: Moderately distended, nontender to palpation, normal active bowel sounds Musculoskeletal: Normal bulk and tone. Unna boots intact. 1-2+ bilateral pitting edema bilaterally. Neurological: Is alert and oriented x4, no apparent focal deficits noted. Skin: Warm and dry. Psychiatric: Normal mood and affect. Behavior is normal. Judgment and thought content normal.   BMP Latest Ref Rng & Units 12/10/2020 12/09/2020 12/09/2020  Glucose 70 - 99 mg/dL 131(H) -  107(H)  BUN 8 - 23 mg/dL 124(H) - 123(H)  Creatinine 0.61 - 1.24 mg/dL 2.40(H) - 2.05(H)  BUN/Creat Ratio 10 - 24 - - -  Sodium 135 - 145 mmol/L 126(L) 129(L) 129(L)  Potassium 3.5 - 5.1 mmol/L 5.2(H) - 4.9  Chloride 98 - 111 mmol/L 89(L) - 88(L)  CO2 22 - 32 mmol/L 25 - 28  Calcium 8.9 - 10.3 mg/dL 9.6 - 9.7    Assessment/Plan:  Active Problems:   Acute exacerbation of CHF (congestive heart failure) (HCC)   Acute respiratory distress  [name] is a 65 year old gentleman with a past medical history of right-sided heart failure with high cardiac output, pulmonary hypertension of unknown etiology, cryptogenic cirrhosis c/b HCC s/p transplant on mycophenolate, IDDM, CKD Stage 3b who presents to the ED with complaints of shortness of breath with cough currently admitted for acute hypoxic respiratory failure with respiratory distress.   # Acute Hypoxic Respiratory Failure  # Acute Respiratory Distress  # RSV Increase shortness of breath onset about 2 weeks ago. Continues to have significant cough on exam. Saturation of 97% on RA. Respiratory viral panel positive for RSV. He remains afebrile with no leukocytosis. Patient with history of liver transplant in 2013 and on mycophenolate may be at increased risk or more severe infection. CT chest on 10/22 negative for pleural effusion, consolidation, or pulmonary edema. Given he does not have a history of lung or cardiac transplant and time since symptom onset unlikely to benefit from antivirals.  Patient continues to endorse a dry cough despite trying multiple different regimens. Given SOB started about 2 weeks ago, will  give steroids at this time for symptomatic relief of his cough. - Continue tessalon Perles 200 mg 3 times daily --Tussionex 5 mL p.o. twice daily for cough - Supplemental O2 as needed to maintain saturations >92%. Wean as tolerated -- Duonebs q6h PRN -- Prednisone 40 mg PO daily   # Right-sided Heart Failure # HFpEF   Patient with a complicated heart failure picture for which he has had extensive work-up at [facility]. On last admission he diuresed over 20 L of fluid and returned within 48 hours with worsening shortness of breath. Please see H&P for more details.   Today, the patient continues to appear hypervolemic on exam. Na 126. Today, output of 2.5L, Net -8.2L since admit. Weight has been stable around 84.5 kg, which is lower than his dry weight of ~86 kg. Cr currently at 2.4, BUN continues to be elevated. Given his complex picture, we have consulted the heart failure team for their recommendations. Additionally, he does have rales at bilateral lung bases today, so we will repeat a CXR today.  -- Cardiology on board, appreciate their recommendations -- Unna boots, bilateral --1500 mL fluid restriction -- IV Lasix 80 mg BID - Continue home spironolactone --Would benefit from Ider following aggressive diuresis, when able -- Monitor I/O's, weights -- Flutter valve -- CXR pending  # CKD Stage 3b  Creatinine at 2.4 today. Baseline between 2.2 - 2.5.  - Monitor renal function - Strict I/Os   # Cryptogenic Cirrhosis c/b HCC s/p transplant (2013) - Continue mycophenolate 1,000 mg twice daily   # Insulin-Dependent Type 2 Diabetes Mellitus  A1c 5.2, CBGs well controlled during this admission. - SSI  # Pulmonary Hypertension  Etiology uncertain. MRI in February with portal hypertension with splenomegaly, ascites and enlarged venous vasculature suggestive of portopulmonary hypertension.   - Will need outpatient follow up with pulmonary hypertension clinic at [facility] (previously referred, but patient has not arranged an appointment).  #Thoracic Aortic aneurysm 4.0 cm aneurysm of the ascending aorta. Incidental finding on CT chest on 10/22.  -Will need annual CTA/MRA for surveillance  #PAD Right ABI 1.04, Left ABI 0.73. Started on moderate-intensity statin that does not interact with mycophenolate. -Continue  atorvastatin 20 mg po daily  Diet: Heart Healthy/carb modified with 1500 mL fluid restriction VTE: Enoxaparin IVF: None Code: Full  Prior to Admission Living Arrangement: Home, living with his wife Anticipated Discharge Location:  Home Barriers to Discharge: Continued medical stabilization  Dispo: Anticipated discharge pending medical stabilization  [name], MD 12/10/2020, 6:04 AM Pager: [phone] After 5pm on weekdays and 1pm on weekends: On Call pager [phone]

## 2020-12-10 NOTE — Progress Notes (Addendum)
Advanced Heart Failure Rounding Note  PCP-Cardiologist: Shirlee More, MD   Subjective:    SCr trending back up, 2.05>>2.40. BUN 124. K 5.2   2.5L in UOP yesterday w/ IV Lasix. Wt up 2 lb.   Na remains low despite tolvaptan, 126 today. LFTs ok    Objective:   Weight Range: 84.5 kg Body mass index is 26.73 kg/m.   Vital Signs:   Temp:  [97.6 F (36.4 C)-98 F (36.7 C)] 97.6 F (36.4 C) (10/27 0336) Pulse Rate:  [84-92] 92 (10/27 0336) Resp:  [18-20] 20 (10/27 0336) BP: (118-136)/(52-58) 136/56 (10/27 0336) SpO2:  [97 %-100 %] 97 % (10/27 0336) Weight:  [84.5 kg] 84.5 kg (10/27 0336)    Weight change: Filed Weights   12/08/20 0200 12/09/20 0500 12/10/20 0336  Weight: 84.1 kg 83.8 kg 84.5 kg    Intake/Output:   Intake/Output Summary (Last 24 hours) at 12/10/2020 0735 Last data filed at 12/10/2020 1638 Gross per 24 hour  Intake 3395 ml  Output 2525 ml  Net 870 ml      Physical Exam    General: thin and fatigued appearing WM, sitting up in chair, coughing  HEENT: normal Neck: supple. JVD elevated to ear. Carotids 2+ bilat; no bruits. No lymphadenopathy or thyromegaly appreciated. Cor: PMI nondisplaced. Regular rate & rhythm. No rubs, gallops or murmurs. Lungs: bibasilar wheezing and rhonchi  Abdomen: soft, nontender, nondistended. No hepatosplenomegaly. No bruits or masses. Good bowel sounds. Extremities: no cyanosis, clubbing, rash, 1+ bilateral LEE up to knees + unna boots  Neuro: alert & oriented x 3, cranial nerves grossly intact. moves all 4 extremities w/o difficulty. Affect pleasant.    Telemetry   NSR 90s (Personally reviewed)  Labs    CBC Recent Labs    12/08/20 0008 12/09/20 0355  WBC 6.8 6.0  HGB 10.3* 10.2*  HCT 30.6* 30.3*  MCV 89.5 90.2  PLT 113* 466*   Basic Metabolic Panel Recent Labs    12/09/20 0355 12/09/20 1209 12/09/20 1946 12/10/20 0320  NA 125* 129* 129* 126*  K 4.0 4.9  --  5.2*  CL 87* 88*  --  89*  CO2  26 28  --  25  GLUCOSE 156* 107*  --  131*  BUN 126* 123*  --  124*  CREATININE 2.00* 2.05*  --  2.40*  CALCIUM 9.1 9.7  --  9.6  MG 2.9*  --   --   --    Liver Function Tests Recent Labs    12/09/20 1209 12/10/20 0320  AST 25 25  ALT 19 18  ALKPHOS 67 63  BILITOT 1.0 1.3*  PROT 7.0 6.9  ALBUMIN 4.7 4.6   No results for input(s): LIPASE, AMYLASE in the last 72 hours. Cardiac Enzymes No results for input(s): CKTOTAL, CKMB, CKMBINDEX, TROPONINI in the last 72 hours.  BNP: BNP (last 3 results) Recent Labs    11/22/20 0622 11/29/20 1015 12/05/20 0510  BNP 352.1* 222.9* 237.7*    ProBNP (last 3 results) No results for input(s): PROBNP in the last 8760 hours.   D-Dimer No results for input(s): DDIMER in the last 72 hours. Hemoglobin A1C No results for input(s): HGBA1C in the last 72 hours. Fasting Lipid Panel No results for input(s): CHOL, HDL, LDLCALC, TRIG, CHOLHDL, LDLDIRECT in the last 72 hours. Thyroid Function Tests No results for input(s): TSH, T4TOTAL, T3FREE, THYROIDAB in the last 72 hours.  Invalid input(s): FREET3  Other results:   Imaging  No results found.   Medications:     Scheduled Medications:  aspirin EC  81 mg Oral Daily   atorvastatin  20 mg Oral Daily   benzonatate  200 mg Oral TID   chlorpheniramine-HYDROcodone  5 mL Oral Q12H   docusate sodium  100 mg Oral BID   enoxaparin (LOVENOX) injection  40 mg Subcutaneous Q24H   furosemide  80 mg Intravenous BID   hydrALAZINE  25 mg Oral q morning   And   hydrALAZINE  12.5 mg Oral QHS   insulin aspart  0-9 Units Subcutaneous TID WC   mycophenolate  1,000 mg Oral BID   potassium chloride  40 mEq Oral BID   sodium chloride flush  3 mL Intravenous Q12H   spironolactone  25 mg Oral BID    Infusions:   PRN Medications: acetaminophen **OR** acetaminophen, alum & mag hydroxide-simeth, dextromethorphan-guaiFENesin, ipratropium-albuterol, lip balm, menthol-cetylpyridinium,  methocarbamol, ondansetron (ZOFRAN) IV, phenol, polyethylene glycol    Patient Profile   65 y/o male w/ h/o NAFLD and hepatocellular carcinoma with liver transplant.  Now with RV failure in the setting of high output HF, has had extensive workup at [facility] with no cardiac or peripheral shunt discovered.  CKD stage III with creatinine 2.5 at baseline.    He had a recent admission here with CHF, not seen by cardiology.  Readmitted < 1 week post-discharge with wheezing and dyspnea, found to have recurrent CHF as well as RSV infection.   Assessment/Plan   1. Acute on chronic diastolic CHF with prominent RV failure: High output HF by last RHC in 12/21 at [facility].  Cardiac MRI in 12/21 at [facility] showed EF 74%, D-shaped septum, enlarged RV with RV EF 37%, Qp/Qs 1 with no evidence for significant shunt lesion, no pulmonary vein anomalies.  Echo this admission with normal EF 65-70%, moderate RV enlargement/mildly decreased RV systolic function, moderate TR.  MRI abdomen in 2/22 showed no evidence for AV shunt or fistula.   He has had extensive workup at [facility] with no definite intra- or extra-cardiac shunt lesion found to explain high output. He has struggled with RV failure, currently appears markedly volume overloaded but this is complicated by cardiorenal syndrome with markedly high BUN.  - C/w unna boots (ABIs not markedly abnormal).  - Continue Lasix 80 mg IV bid. ?if may benefit from metolazone once hyponatremia corrected.  BUN quite high, Scr 2.4 Would need to monitor closely. - Repeat RHC when more diuresed.  - US abdomen with no drainable ascites - SGLT2 inhibitor stopped due to perineal yeast infection.  - Continue spiro 25 mg BID 2. CKD stage 3: Creatinine around baseline at 2.65 but BUN quite high.  Suspect cardiorenal syndrome.  - Will try to push diuresis to decongest/lower renal venous pressure.  - SCr , 2.65>>2.42>>2.34>>2.00>2.40 - BP stable, does not appear to need midodrine.  3. Hyponatremia:   Hypervolemic hyponatremia.  - Tolvaptan given 10/24, 10/25 and 10/26 . Na 123>>126>>125>>126. LFTs ok  - C/w diuresis  - Fluid restrict.  4. H/o liver transplant: Followed at [facility]. On mycophenolate.  Off tacrolimus due to side effects.  5. PAD: Moderate on left. Will need statin that does not interact with immunosuppression.  6. RSV: continues w/ persistent cough - continue cough regimen  7. Hyperkalemia - K 5.2 - Stop KCl - Additional IV Lasix today  - C/w spiro, monitor BMP    Length of Stay: 3  Brittainy Simmons, PA-C  12/10/2020, 7:35 AM  Advanced Heart  Failure Team Pager [phone] (M-F; 7a - 5p)  Please contact [facility] for night-coverage after hours (5p -7a ) and weekends on [email or web address]   Patient seen with PA, agree with the above note.    Less UOP yesterday with Lasix 80 mg IV bid and tolvaptan.  BUN stable but creatinine higher at 2.4.  Na down to 126, K 5.2.    Cough is better, no dyspnea walking short distances.    General: NAD Neck: JVP 12 cm, no thyromegaly or thyroid nodule.  Lungs: Rhonchi bilaterally.  CV: Nondisplaced PMI.  Heart regular S1/S2, no S3/S4, 2/6 HSM LLSB.  2+ edema to knees.   Abdomen: Soft, nontender, no hepatosplenomegaly, mild distention.  Skin: Intact without lesions or rashes.  Neurologic: Alert and oriented x 3.  Psych: Normal affect. Extremities: No clubbing or cyanosis.  HEENT: Normal.   Still with volume overload from RV failure but complicated by cardiorenal syndrome.  BUN stable but creatinine now trending up.  We may have reached our limit for aggressive diuresis at this point, he says that he thinks his weight is the lowest it has been in a long time.  - Will continue Lasix 80 mg IV bid for 1 more day.  - Na 126 today, will give tolvaptan 15 mg again today (hypervolemic hyponatremia).  LFTs normal.  - Continue Unna boots.  - RHC tomorrow to assess LV and RV filling pressures and cardiac output. Could consider trial of  milrinone to support RV for further diuresis if cardiac output is low and RV filling pressure is markedly high.   Loralie Champagne 12/10/2020 2:09 PM

## 2020-12-11 ENCOUNTER — Encounter (HOSPITAL_COMMUNITY)
Admission: EM | Disposition: A | Discharge status: Home Health | Payer: Self-pay | Source: Home / Self Care | Attending: Internal Medicine

## 2020-12-11 DIAGNOSIS — I5033 Acute on chronic diastolic (congestive) heart failure: Secondary | ICD-10-CM | POA: Diagnosis not present

## 2020-12-11 DIAGNOSIS — I50810 Right heart failure, unspecified: Secondary | ICD-10-CM | POA: Diagnosis not present

## 2020-12-11 DIAGNOSIS — E875 Hyperkalemia: Secondary | ICD-10-CM

## 2020-12-11 DIAGNOSIS — J9601 Acute respiratory failure with hypoxia: Secondary | ICD-10-CM | POA: Diagnosis not present

## 2020-12-11 HISTORY — PX: RIGHT HEART CATH: CATH118263

## 2020-12-11 LAB — POCT I-STAT EG7
Acid-Base Excess: 1 mmol/L (ref 0.0–2.0)
Acid-Base Excess: 2 mmol/L (ref 0.0–2.0)
Acid-Base Excess: 2 mmol/L (ref 0.0–2.0)
Acid-Base Excess: 2 mmol/L (ref 0.0–2.0)
Acid-Base Excess: 2 mmol/L (ref 0.0–2.0)
Acid-Base Excess: 2 mmol/L (ref 0.0–2.0)
Acid-Base Excess: 3 mmol/L — ABNORMAL HIGH (ref 0.0–2.0)
Acid-Base Excess: 3 mmol/L — ABNORMAL HIGH (ref 0.0–2.0)
Bicarbonate: 26.7 mmol/L (ref 20.0–28.0)
Bicarbonate: 26.9 mmol/L (ref 20.0–28.0)
Bicarbonate: 26.6 mmol/L (ref 20.0–28.0)
Bicarbonate: 26.6 mmol/L (ref 20.0–28.0)
Bicarbonate: 27.7 mmol/L (ref 20.0–28.0)
Bicarbonate: 27.8 mmol/L (ref 20.0–28.0)
Bicarbonate: 28.1 mmol/L — ABNORMAL HIGH (ref 20.0–28.0)
Bicarbonate: 28.5 mmol/L — ABNORMAL HIGH (ref 20.0–28.0)
Calcium, Ion: 1.21 mmol/L (ref 1.15–1.40)
Calcium, Ion: 1.37 mmol/L (ref 1.15–1.40)
Calcium, Ion: 1.16 mmol/L (ref 1.15–1.40)
Calcium, Ion: 1.17 mmol/L (ref 1.15–1.40)
Calcium, Ion: 1.19 mmol/L (ref 1.15–1.40)
Calcium, Ion: 1.19 mmol/L (ref 1.15–1.40)
Calcium, Ion: 1.19 mmol/L (ref 1.15–1.40)
Calcium, Ion: 1.2 mmol/L (ref 1.15–1.40)
HCT: 33 % — ABNORMAL LOW (ref 39.0–52.0)
HCT: 34 % — ABNORMAL LOW (ref 39.0–52.0)
HCT: 32 % — ABNORMAL LOW (ref 39.0–52.0)
HCT: 33 % — ABNORMAL LOW (ref 39.0–52.0)
HCT: 33 % — ABNORMAL LOW (ref 39.0–52.0)
HCT: 33 % — ABNORMAL LOW (ref 39.0–52.0)
HCT: 33 % — ABNORMAL LOW (ref 39.0–52.0)
HCT: 34 % — ABNORMAL LOW (ref 39.0–52.0)
Hemoglobin: 11.2 g/dL — ABNORMAL LOW (ref 13.0–17.0)
Hemoglobin: 11.6 g/dL — ABNORMAL LOW (ref 13.0–17.0)
Hemoglobin: 10.9 g/dL — ABNORMAL LOW (ref 13.0–17.0)
Hemoglobin: 11.2 g/dL — ABNORMAL LOW (ref 13.0–17.0)
Hemoglobin: 11.2 g/dL — ABNORMAL LOW (ref 13.0–17.0)
Hemoglobin: 11.2 g/dL — ABNORMAL LOW (ref 13.0–17.0)
Hemoglobin: 11.2 g/dL — ABNORMAL LOW (ref 13.0–17.0)
Hemoglobin: 11.6 g/dL — ABNORMAL LOW (ref 13.0–17.0)
O2 Saturation: 83 %
O2 Saturation: 89 %
O2 Saturation: 60 %
O2 Saturation: 67 %
O2 Saturation: 70 %
O2 Saturation: 84 %
O2 Saturation: 84 %
O2 Saturation: 85 %
Potassium: 4.8 mmol/L (ref 3.5–5.1)
Potassium: 5 mmol/L (ref 3.5–5.1)
Potassium: 4.6 mmol/L (ref 3.5–5.1)
Potassium: 4.6 mmol/L (ref 3.5–5.1)
Potassium: 4.7 mmol/L (ref 3.5–5.1)
Potassium: 4.7 mmol/L (ref 3.5–5.1)
Potassium: 4.7 mmol/L (ref 3.5–5.1)
Potassium: 4.9 mmol/L (ref 3.5–5.1)
Sodium: 125 mmol/L — ABNORMAL LOW (ref 135–145)
Sodium: 130 mmol/L — ABNORMAL LOW (ref 135–145)
Sodium: 126 mmol/L — ABNORMAL LOW (ref 135–145)
Sodium: 126 mmol/L — ABNORMAL LOW (ref 135–145)
Sodium: 126 mmol/L — ABNORMAL LOW (ref 135–145)
Sodium: 126 mmol/L — ABNORMAL LOW (ref 135–145)
Sodium: 126 mmol/L — ABNORMAL LOW (ref 135–145)
Sodium: 127 mmol/L — ABNORMAL LOW (ref 135–145)
TCO2: 28 mmol/L (ref 22–32)
TCO2: 28 mmol/L (ref 22–32)
TCO2: 28 mmol/L (ref 22–32)
TCO2: 28 mmol/L (ref 22–32)
TCO2: 29 mmol/L (ref 22–32)
TCO2: 29 mmol/L (ref 22–32)
TCO2: 30 mmol/L (ref 22–32)
TCO2: 30 mmol/L (ref 22–32)
pCO2, Ven: 42.7 mmHg — ABNORMAL LOW (ref 44.0–60.0)
pCO2, Ven: 44.8 mmHg (ref 44.0–60.0)
pCO2, Ven: 42.7 mmHg — ABNORMAL LOW (ref 44.0–60.0)
pCO2, Ven: 42.9 mmHg — ABNORMAL LOW (ref 44.0–60.0)
pCO2, Ven: 44.1 mmHg (ref 44.0–60.0)
pCO2, Ven: 48 mmHg (ref 44.0–60.0)
pCO2, Ven: 48.1 mmHg (ref 44.0–60.0)
pCO2, Ven: 48.3 mmHg (ref 44.0–60.0)
pH, Ven: 7.384 (ref 7.250–7.430)
pH, Ven: 7.407 (ref 7.250–7.430)
pH, Ven: 7.368 (ref 7.250–7.430)
pH, Ven: 7.376 (ref 7.250–7.430)
pH, Ven: 7.38 (ref 7.250–7.430)
pH, Ven: 7.4 (ref 7.250–7.430)
pH, Ven: 7.403 (ref 7.250–7.430)
pH, Ven: 7.406 (ref 7.250–7.430)
pO2, Ven: 48 mmHg — ABNORMAL HIGH (ref 32.0–45.0)
pO2, Ven: 57 mmHg — ABNORMAL HIGH (ref 32.0–45.0)
pO2, Ven: 32 mmHg (ref 32.0–45.0)
pO2, Ven: 36 mmHg (ref 32.0–45.0)
pO2, Ven: 38 mmHg (ref 32.0–45.0)
pO2, Ven: 49 mmHg — ABNORMAL HIGH (ref 32.0–45.0)
pO2, Ven: 49 mmHg — ABNORMAL HIGH (ref 32.0–45.0)
pO2, Ven: 51 mmHg — ABNORMAL HIGH (ref 32.0–45.0)

## 2020-12-11 LAB — BASIC METABOLIC PANEL
Anion gap: 14 (ref 5–15)
Anion gap: 16 — ABNORMAL HIGH (ref 5–15)
BUN: 129 mg/dL — ABNORMAL HIGH (ref 8–23)
BUN: 127 mg/dL — ABNORMAL HIGH (ref 8–23)
CO2: 26 mmol/L (ref 22–32)
CO2: 25 mmol/L (ref 22–32)
Calcium: 9.8 mg/dL (ref 8.9–10.3)
Calcium: 9.9 mg/dL (ref 8.9–10.3)
Chloride: 86 mmol/L — ABNORMAL LOW (ref 98–111)
Chloride: 85 mmol/L — ABNORMAL LOW (ref 98–111)
Creatinine, Ser: 2.29 mg/dL — ABNORMAL HIGH (ref 0.61–1.24)
Creatinine, Ser: 2.22 mg/dL — ABNORMAL HIGH (ref 0.61–1.24)
GFR, Estimated: 31 mL/min — ABNORMAL LOW (ref >60)
GFR, Estimated: 32 mL/min — ABNORMAL LOW (ref >60)
Glucose, Bld: 129 mg/dL — ABNORMAL HIGH (ref 70–99)
Glucose, Bld: 276 mg/dL — ABNORMAL HIGH (ref 70–99)
Potassium: 4.9 mmol/L (ref 3.5–5.1)
Potassium: 4.4 mmol/L (ref 3.5–5.1)
Sodium: 126 mmol/L — ABNORMAL LOW (ref 135–145)
Sodium: 126 mmol/L — ABNORMAL LOW (ref 135–145)

## 2020-12-11 LAB — COMPREHENSIVE METABOLIC PANEL
ALT: 21 U/L (ref 0–44)
AST: 27 U/L (ref 15–41)
Albumin: 4.4 g/dL (ref 3.5–5.0)
Alkaline Phosphatase: 70 U/L (ref 38–126)
Anion gap: 12 (ref 5–15)
BUN: 126 mg/dL — ABNORMAL HIGH (ref 8–23)
CO2: 25 mmol/L (ref 22–32)
Calcium: 9.6 mg/dL (ref 8.9–10.3)
Chloride: 87 mmol/L — ABNORMAL LOW (ref 98–111)
Creatinine, Ser: 2.16 mg/dL — ABNORMAL HIGH (ref 0.61–1.24)
GFR, Estimated: 33 mL/min — ABNORMAL LOW (ref >60)
Glucose, Bld: 158 mg/dL — ABNORMAL HIGH (ref 70–99)
Potassium: 4.7 mmol/L (ref 3.5–5.1)
Sodium: 124 mmol/L — ABNORMAL LOW (ref 135–145)
Total Bilirubin: 1.2 mg/dL (ref 0.3–1.2)
Total Protein: 6.9 g/dL (ref 6.5–8.1)

## 2020-12-11 LAB — HEPATIC FUNCTION PANEL
ALT: 22 U/L (ref 0–44)
AST: 28 U/L (ref 15–41)
Albumin: 4.7 g/dL (ref 3.5–5.0)
Alkaline Phosphatase: 70 U/L (ref 38–126)
Bilirubin, Direct: 0.3 mg/dL — ABNORMAL HIGH (ref 0.0–0.2)
Indirect Bilirubin: 1.1 mg/dL — ABNORMAL HIGH (ref 0.3–0.9)
Total Bilirubin: 1.4 mg/dL — ABNORMAL HIGH (ref 0.3–1.2)
Total Protein: 7.1 g/dL (ref 6.5–8.1)

## 2020-12-11 LAB — GLUCOSE, CAPILLARY
Glucose-Capillary: 136 mg/dL — ABNORMAL HIGH (ref 70–99)
Glucose-Capillary: 138 mg/dL — ABNORMAL HIGH (ref 70–99)
Glucose-Capillary: 141 mg/dL — ABNORMAL HIGH (ref 70–99)
Glucose-Capillary: 159 mg/dL — ABNORMAL HIGH (ref 70–99)
Glucose-Capillary: 230 mg/dL — ABNORMAL HIGH (ref 70–99)

## 2020-12-11 SURGERY — RIGHT HEART CATH
Anesthesia: LOCAL

## 2020-12-11 MED ORDER — FUROSEMIDE 10 MG/ML IJ SOLN
40.0000 mg | Freq: Once | INTRAMUSCULAR | Status: AC
  Administered 2020-12-11: 40 mg via INTRAVENOUS
  Filled 2020-12-11: qty 4

## 2020-12-11 MED ORDER — HEPARIN (PORCINE) IN NACL 1000-0.9 UT/500ML-% IV SOLN
INTRAVENOUS | Status: AC
  Filled 2020-12-11: qty 500

## 2020-12-11 MED ORDER — SODIUM CHLORIDE 0.9 % IV SOLN
250.0000 mL | INTRAVENOUS | Status: DC | PRN
Start: 2020-12-11 — End: 2020-12-11

## 2020-12-11 MED ORDER — SODIUM CHLORIDE 0.9% FLUSH
3.0000 mL | INTRAVENOUS | Status: DC | PRN
Start: 2020-12-11 — End: 2020-12-11

## 2020-12-11 MED ORDER — TOLVAPTAN 15 MG PO TABS
30.0000 mg | ORAL_TABLET | Freq: Once | ORAL | Status: AC
  Administered 2020-12-11: 30 mg via ORAL
  Filled 2020-12-11: qty 2

## 2020-12-11 MED ORDER — LIDOCAINE HCL (PF) 1 % IJ SOLN
INTRAMUSCULAR | Status: DC | PRN
  Administered 2020-12-11: 2 mL via SUBCUTANEOUS

## 2020-12-11 MED ORDER — FENTANYL CITRATE (PF) 100 MCG/2ML IJ SOLN
INTRAMUSCULAR | Status: AC
  Filled 2020-12-11: qty 2

## 2020-12-11 MED ORDER — SODIUM CHLORIDE 0.9 % IV SOLN: INTRAVENOUS | Status: DC | PRN

## 2020-12-11 MED ORDER — ASPIRIN EC 81 MG PO TBEC
81.0000 mg | DELAYED_RELEASE_TABLET | Freq: Every day | ORAL | Status: DC
  Administered 2020-12-12 – 2020-12-16 (×5): 81 mg via ORAL
  Filled 2020-12-11 (×5): qty 1

## 2020-12-11 MED ORDER — ASPIRIN 81 MG PO CHEW
81.0000 mg | CHEWABLE_TABLET | ORAL | Status: AC
  Administered 2020-12-11: 81 mg via ORAL
  Filled 2020-12-11: qty 1

## 2020-12-11 MED ORDER — LIDOCAINE HCL (PF) 1 % IJ SOLN
INTRAMUSCULAR | Status: AC
  Filled 2020-12-11: qty 30

## 2020-12-11 MED ORDER — MIDAZOLAM HCL 2 MG/2ML IJ SOLN
INTRAMUSCULAR | Status: DC | PRN
  Administered 2020-12-11: .5 mg via INTRAVENOUS

## 2020-12-11 MED ORDER — SODIUM CHLORIDE 0.9 % IV SOLN: INTRAVENOUS | Status: DC

## 2020-12-11 MED ORDER — MIDAZOLAM HCL 2 MG/2ML IJ SOLN
INTRAMUSCULAR | Status: AC
  Filled 2020-12-11: qty 2

## 2020-12-11 MED ORDER — HEPARIN (PORCINE) IN NACL 1000-0.9 UT/500ML-% IV SOLN
INTRAVENOUS | Status: DC | PRN
  Administered 2020-12-11: 500 mL

## 2020-12-11 MED ORDER — FUROSEMIDE 10 MG/ML IJ SOLN
10.0000 mg/h | INTRAVENOUS | Status: DC
  Administered 2020-12-11 – 2020-12-14 (×5): 10 mg/h via INTRAVENOUS
  Filled 2020-12-11 (×7): qty 20

## 2020-12-11 MED ORDER — FUROSEMIDE 10 MG/ML IJ SOLN
80.0000 mg | Freq: Two times a day (BID) | INTRAMUSCULAR | Status: DC
  Administered 2020-12-11: 80 mg via INTRAVENOUS
  Filled 2020-12-11: qty 8

## 2020-12-11 MED ORDER — INSULIN ASPART 100 UNIT/ML IJ SOLN
0.0000 [IU] | Freq: Three times a day (TID) | INTRAMUSCULAR | Status: DC
  Administered 2020-12-11: 1 [IU] via SUBCUTANEOUS
  Administered 2020-12-11: 2 [IU] via SUBCUTANEOUS
  Administered 2020-12-11 – 2020-12-12 (×2): 1 [IU] via SUBCUTANEOUS
  Administered 2020-12-12: 3 [IU] via SUBCUTANEOUS
  Administered 2020-12-12 – 2020-12-13 (×3): 1 [IU] via SUBCUTANEOUS
  Administered 2020-12-13: 2 [IU] via SUBCUTANEOUS
  Administered 2020-12-14: 3 [IU] via SUBCUTANEOUS
  Administered 2020-12-15: 1 [IU] via SUBCUTANEOUS

## 2020-12-11 SURGICAL SUPPLY — 9 items
CATH BALLN WEDGE 5F 110CM (CATHETERS) ×1 IMPLANT
KIT HEART LEFT (KITS) ×2 IMPLANT
PACK CARDIAC CATHETERIZATION (CUSTOM PROCEDURE TRAY) ×2 IMPLANT
PROTECTION STATION PRESSURIZED (MISCELLANEOUS) ×2
SHEATH GLIDE SLENDER 4/5FR (SHEATH) ×1 IMPLANT
STATION PROTECTION PRESSURIZED (MISCELLANEOUS) IMPLANT
TRANSDUCER W/STOPCOCK (MISCELLANEOUS) ×2 IMPLANT
TUBING ART PRESS 72  MALE/FEM (TUBING) ×2
TUBING ART PRESS 72 MALE/FEM (TUBING) IMPLANT

## 2020-12-11 NOTE — Progress Notes (Addendum)
Advanced Heart Failure Rounding Note  PCP-Cardiologist: Shirlee More, MD   Subjective:    SCr 2.05>>2.40>>2.16. BUN 126. K 4.7.   Received Tolvaptan yesterday. Na 124. LFTs okay.   Voided 2.2L yesterday with IV lasix. Unmeasured UOP noted. Weight up 1 lb.   Going for RHC today  Still coughing frequently but notes some improvement.   Objective:   Weight Range: 84.9 kg Body mass index is 26.85 kg/m.   Vital Signs:   Temp:  [97.8 F (36.6 C)-98.4 F (36.9 C)] 97.8 F (36.6 C) (10/28 0508) Pulse Rate:  [85-91] 85 (10/28 0508) Resp:  [18] 18 (10/28 0508) BP: (109-124)/(55-59) 124/59 (10/28 0508) SpO2:  [94 %-96 %] 96 % (10/28 0508) Weight:  [84.9 kg] 84.9 kg (10/28 0500)    Weight change: Filed Weights   12/10/20 0336 12/11/20 0050 12/11/20 0500  Weight: 84.5 kg 84.9 kg 84.9 kg    Intake/Output:   Intake/Output Summary (Last 24 hours) at 12/11/2020 0745 Last data filed at 12/11/2020 0050 Gross per 24 hour  Intake 1560 ml  Output 2200 ml  Net -640 ml      Physical Exam    General:  Sitting up in chair, no distress. Thin, appears fatigued. HEENT: normal Neck: supple. + JVP to jaw.  Cor: PMI nondisplaced. Regular rate & rhythm. No rubs, gallops or murmurs. Lungs: less wheezes, + rhonchi Abdomen: soft, nontender, nondistended. No hepatosplenomegaly. No bruits or masses. Good bowel sounds. Extremities: no cyanosis, clubbing, rash, 2+ edema b/l, UNNA boots Neuro: alert & orientedx3, cranial nerves grossly intact. moves all 4 extremities w/o difficulty. Affect pleasant    Telemetry   NSR 80s-90s (personally reviewed)  Labs    CBC Recent Labs    12/09/20 0355  WBC 6.0  HGB 10.2*  HCT 30.3*  MCV 90.2  PLT 253*   Basic Metabolic Panel Recent Labs    12/09/20 0355 12/09/20 1209 12/10/20 1438 12/10/20 2300 12/11/20 0358  NA 125*   < > 126* 125* 124*  K 4.0   < > 5.1  --  4.7  CL 87*   < > 88*  --  87*  CO2 26   < > 26  --  25  GLUCOSE  156*   < > 127*  --  158*  BUN 126*   < > 126*  --  126*  CREATININE 2.00*   < > 2.22*  --  2.16*  CALCIUM 9.1   < > 9.5  --  9.6  MG 2.9*  --   --   --   --    < > = values in this interval not displayed.   Liver Function Tests Recent Labs    12/10/20 1438 12/11/20 0358  AST 29 27  ALT 19 21  ALKPHOS 65 70  BILITOT 1.2 1.2  PROT 7.1 6.9  ALBUMIN 4.7 4.4   No results for input(s): LIPASE, AMYLASE in the last 72 hours. Cardiac Enzymes No results for input(s): CKTOTAL, CKMB, CKMBINDEX, TROPONINI in the last 72 hours.  BNP: BNP (last 3 results) Recent Labs    11/22/20 0622 11/29/20 1015 12/05/20 0510  BNP 352.1* 222.9* 237.7*    ProBNP (last 3 results) No results for input(s): PROBNP in the last 8760 hours.   D-Dimer No results for input(s): DDIMER in the last 72 hours. Hemoglobin A1C No results for input(s): HGBA1C in the last 72 hours. Fasting Lipid Panel No results for input(s): CHOL, HDL, LDLCALC, TRIG, CHOLHDL, LDLDIRECT  in the last 72 hours. Thyroid Function Tests No results for input(s): TSH, T4TOTAL, T3FREE, THYROIDAB in the last 72 hours.  Invalid input(s): FREET3  Other results:   Imaging    DG CHEST PORT 1 VIEW  Result Date: 12/10/2020 CLINICAL DATA:  Cough.  Shortness of breath. EXAM: PORTABLE CHEST 1 VIEW COMPARISON:  Chest CT 12/05/2020.  Chest radiographs 12/05/2020. FINDINGS: Cardiomegaly. As before, there is marked prominence of the pulmonary arteries suggesting pulmonary hypertension and pulmonary vascular congestion. Mild pulmonary edema is also questioned. Mild atelectasis within the left lung base. No evidence of pleural effusion or pneumothorax. No acute bony abnormality identified. IMPRESSION: Cardiomegaly, unchanged. As before, there is marked prominence of the pulmonary arteries suggesting pulmonary hypertension and pulmonary vascular congestion. Mild pulmonary edema is also questioned. Mild atelectasis within the left lung base.  Electronically Signed   By: Kellie Simmering D.O.   On: 12/10/2020 11:41     Medications:     Scheduled Medications:  [START ON 12/12/2020] aspirin EC  81 mg Oral Daily   atorvastatin  20 mg Oral Daily   benzonatate  200 mg Oral TID   chlorpheniramine-HYDROcodone  5 mL Oral Q12H   docusate sodium  100 mg Oral BID   enoxaparin (LOVENOX) injection  40 mg Subcutaneous Q24H   hydrALAZINE  25 mg Oral q morning   And   hydrALAZINE  12.5 mg Oral QHS   insulin aspart  0-9 Units Subcutaneous TID WC   mycophenolate  1,000 mg Oral BID   predniSONE  40 mg Oral Q breakfast   sodium chloride flush  3 mL Intravenous Q12H   sodium chloride flush  3 mL Intravenous Q12H    Infusions:  sodium chloride     sodium chloride      PRN Medications: sodium chloride, acetaminophen **OR** acetaminophen, alum & mag hydroxide-simeth, dextromethorphan-guaiFENesin, ipratropium-albuterol, lip balm, menthol-cetylpyridinium, methocarbamol, ondansetron (ZOFRAN) IV, phenol, polyethylene glycol, sodium chloride flush    Patient Profile   65 y/o male w/ h/o NAFLD and hepatocellular carcinoma with liver transplant.  Now with RV failure in the setting of high output HF, has had extensive workup at [facility] with no cardiac or peripheral shunt discovered.  CKD stage III with creatinine 2.5 at baseline.    He had a recent admission here with CHF, not seen by cardiology.  Readmitted < 1 week post-discharge with wheezing and dyspnea, found to have recurrent CHF as well as RSV infection.   Assessment/Plan   1. Acute on chronic diastolic CHF with prominent RV failure: High output HF by last RHC in 12/21 at [facility].  Cardiac MRI in 12/21 at [facility] showed EF 74%, D-shaped septum, enlarged RV with RV EF 37%, Qp/Qs 1 with no evidence for significant shunt lesion, no pulmonary vein anomalies.  Echo this admission with normal EF 65-70%, moderate RV enlargement/mildly decreased RV systolic function, moderate TR.  MRI abdomen in 2/22 showed no  evidence for AV shunt or fistula.   He has had extensive workup at [facility] with no definite intra- or extra-cardiac shunt lesion found to explain high output. He has struggled with RV failure, currently appears markedly volume overloaded but this is complicated by cardiorenal syndrome with markedly high BUN.  - C/w unna boots (ABIs not markedly abnormal).  - Continues on Lasix 80 mg IV bid. BUN 126, Scr 2.16. Na 124 after Tolvaptan again yesterday. ?if may benefit from metolazone once hyponatremia corrected.   - Diuresis has slowed and weight starting to creep up.  Going for RHC today. May consider trial with milrinone to support RV for further diuresis if CO is low and RV filling pressures are high - US abdomen with no drainable ascites - SGLT2 inhibitor stopped due to perineal yeast infection.  - Continue spiro 25 mg BID 2. CKD stage 3: Creatinine actually slightly better than baseline at 2.16 but BUN quite high.  Suspect cardiorenal syndrome.  - Will try to push diuresis to decongest/lower renal venous pressure.  - SCr , 2.65>>2.42>>2.34>>2.00>2.40>>2.16 - BP stable, does not appear to need midodrine.  3. Hyponatremia:  Hypervolemic hyponatremia.  - Received multiple doses of Tolvaptan, including 10/27. Na 123>>126>>125>>126>>124 today. LFTs ok  - C/w diuresis  - Fluid restrict.  4. H/o liver transplant: Followed at [facility]. On mycophenolate.  Off tacrolimus due to side effects.  5. PAD: Moderate on left. Will need statin that does not interact with immunosuppression.  6. RSV: continues w/ persistent cough, modest improvement - continue cough regimen  7. Hyperkalemia - K 4.7 - KCL stopped 10/27 d/t hyperkalemia - C/w spiro - Daily BMET   Length of Stay: 4  FINCH, LINDSAY N, PA-C  12/11/2020, 7:45 AM  Advanced Heart Failure Team Pager [phone] (M-F; 7a - 5p)  Please contact [facility] for night-coverage after hours (5p -7a ) and weekends on [email or web address]   Patient seen with PA, agree  with the above note.   Some diuresis yesterday but not marked.  BUN/creatinine remain stable.   General: NAD Neck: JVP 12 cm, no thyromegaly or thyroid nodule.  Lungs: Decreased at bases.  CV: Nondisplaced PMI.  Heart regular S1/S2, no S3/S4, 2/6 HSM LLSB.  2+ edema to knees.  Abdomen: Soft, nontender, no hepatosplenomegaly, no distention.  Skin: Intact without lesions or rashes.  Neurologic: Alert and oriented x 3.  Psych: Normal affect. Extremities: No clubbing or cyanosis.  HEENT: Normal.   Still with volume overload from RV failure but complicated by cardiorenal syndrome.  BUN/creatinine high but stable.    - Will continue Lasix 80 mg IV bid today and with Na 124, will give tolvaptan 30 mg (hypervolemic hyponatremia).  LFTs normal.  - Continue Unna boots.  - RHC today to assess LV and RV filling pressures and cardiac output. Could consider trial of milrinone to support RV for further diuresis if cardiac output is low and RV filling pressure is markedly high. Discussed risks/benefits of procedure with patient and he agrees.   Loralie Champagne 12/11/2020 1:39 PM

## 2020-12-11 NOTE — Care Management Important Message (Signed)
Important Message  Patient Details  Name: Gregory Tanner MRN: [id number] Date of Birth: 08-09-1955   Medicare Important Message Given:  Yes     [name] 12/11/2020, 8:58 AM

## 2020-12-11 NOTE — Progress Notes (Signed)
Patient arrived back from the cath lab. Cath site is clean dry and intact. Patient expresses no pain. VS stable and VS protocol initiated. Meal tray ordered. Tele monitor has been applied.

## 2020-12-11 NOTE — Progress Notes (Signed)
Subjective:  [name] is a 65 year old with a past medical history of right-sided heart failure with high cardiac output, pulmonary hypertension of unknown etiology (maybe portopulmonary HTN), cryptogenic cirrhosis c/b HCC s/p transplant on mycophenolate, IDDM, CKD Stage 3b who is admitted for acute hypoxic respiratory failure with respiratory distress.  Patient evaluated at bedside. Patient states that his cough has improved since yesterday afternoon after he got steroids. He states that he has "not had this much fluid off in a long time." No other complaints or concerns at this time.   Objective:  Vital signs in last 24 hours: Vitals:   12/11/20 0050 12/11/20 0500 12/11/20 0508 12/11/20 0950  BP:   (!) 124/59 (!) 133/53  Pulse:   85 88  Resp:   18   Temp:   97.8 F (36.6 C)   TempSrc:   Oral   SpO2:   96% 95%  Weight: 84.9 kg 84.9 kg    Height:       I/O's: I: 1.5 L O: 2.2 L, + 1 stool Net -8.8L since admit Weight 84.9 kg from 84.5 kg yesterday  Physical Exam: Constitutional: Ill-appearing, mild coughing during exam, NAD on RA HENT: Normocephalic and atraumatic, EOMI, conjunctiva normal Cardiovascular: Normal rate, regular rhythm, S1 and S2 present, no murmurs, rubs, gallops.  Respiratory: Normal work of breathing. No accessory muscle usage. CTAB. GI: Moderately distended, nontender to palpation, normal active bowel sounds Musculoskeletal: Normal bulk and tone. Unna boots intact. 1-2+ bilateral pitting edema bilaterally. Neurological: Is alert and oriented x4, no apparent focal deficits noted. Skin: Warm and dry. Psychiatric: Normal mood and affect. Behavior is normal. Judgment and thought content normal.   BMP Latest Ref Rng & Units 12/11/2020 12/10/2020 12/10/2020  Glucose 70 - 99 mg/dL 158(H) - 127(H)  BUN 8 - 23 mg/dL 126(H) - 126(H)  Creatinine 0.61 - 1.24 mg/dL 2.16(H) - 2.22(H)  BUN/Creat Ratio 10 - 24 - - -  Sodium 135 - 145 mmol/L 124(L) 125(L)  126(L)  Potassium 3.5 - 5.1 mmol/L 4.7 - 5.1  Chloride 98 - 111 mmol/L 87(L) - 88(L)  CO2 22 - 32 mmol/L 25 - 26  Calcium 8.9 - 10.3 mg/dL 9.6 - 9.5   CXR, 10/27: IMPRESSION: Cardiomegaly, unchanged. As before, there is marked prominence of the pulmonary arteries suggesting pulmonary hypertension and pulmonary vascular congestion. Mild pulmonary edema is also questioned. Mild atelectasis within the left lung base.   Assessment/Plan:  Active Problems:   Acute on chronic diastolic CHF (congestive heart failure) (HCC)   Acute exacerbation of CHF (congestive heart failure) ([place])   Acute respiratory distress  [name] is a 65 year old gentleman with a past medical history of right-sided heart failure with high cardiac output, pulmonary hypertension of unknown etiology, cryptogenic cirrhosis c/b HCC s/p transplant on mycophenolate, IDDM, CKD Stage 3b who presents to the ED with complaints of shortness of breath with cough currently admitted for acute hypoxic respiratory failure with respiratory distress.   # Acute Hypoxic Respiratory Failure  # Acute Respiratory Distress  # RSV Increase shortness of breath onset about 2 weeks ago. Continues to have significant cough on exam. Saturation of 96% on RA. Respiratory viral panel positive for RSV. He remains afebrile with no leukocytosis. Endorses significant improvement in cough after initiating steroids yesterday. Will continue supportive management at this time.   - Continue tessalon Perles 200 mg 3 times daily -- Tussionex 5 mL p.o. twice daily for cough - Supplemental O2 as needed  to maintain saturations >92%. Wean as tolerated -- Duonebs q6h PRN -- Prednisone 40 mg PO daily  # Right-sided Heart Failure # HFpEF  Patient with a complicated heart failure picture for which he has had extensive work-up at [facility]. On last admission he diuresed over 20 L of fluid and returned within 48 hours with worsening shortness of breath. Please see  H&P for more details.   Today, the patient continues to appear hypervolemic on exam. Na 124. Today, output of 2.2L, Net -8.8L since admit. Weight has been stable around 84.9 kg, which is lower than his dry weight of ~86 kg. Cr currently at 2.16, BUN continues to be elevated. Given his complex picture, we have consulted the heart failure team for their recommendations. Pt will undergo RHC today to assess LV and RV filling pressures and CO.   -- Cardiology on board, appreciate their recommendations -- NPO for RHC today -- Unna boots, bilateral --1200 mL fluid restriction -- IV Lasix 80 mg BID, will give tolvaptan 30 mg today as well - Continue home spironolactone -- Monitor I/O's, weights -- Flutter valve  # CKD Stage 3b  Creatinine at 2.16 today. Baseline between 2.2 - 2.5.  - Monitor renal function - Strict I/Os   # Cryptogenic Cirrhosis c/b HCC s/p transplant (2013) - Continue mycophenolate 1,000 mg twice daily   # Insulin-Dependent Type 2 Diabetes Mellitus  A1c 5.2, CBGs well controlled during this admission. - SSI  # Pulmonary Hypertension  Etiology uncertain. MRI in February with portal hypertension with splenomegaly, ascites and enlarged venous vasculature suggestive of portopulmonary hypertension.   - Will need outpatient follow up with pulmonary hypertension clinic at [facility] (previously referred, but patient has not arranged an appointment).  #Thoracic Aortic aneurysm 4.0 cm aneurysm of the ascending aorta. Incidental finding on CT chest on 10/22.  -Will need annual CTA/MRA for surveillance  #PAD Right ABI 1.04, Left ABI 0.73. Started on moderate-intensity statin that does not interact with mycophenolate. -Continue atorvastatin 20 mg po daily  Diet: Heart Healthy/carb modified with 1200 mL fluid restriction VTE: Enoxaparin IVF: None Code: Full  Prior to Admission Living Arrangement: Home, living with his wife Anticipated Discharge Location:  Home Barriers to  Discharge: Continued medical stabilization, RHC Dispo: Anticipated discharge pending medical stabilization, RHC  [name], MD 12/11/2020, 10:28 AM Pager: [phone] After 5pm on weekdays and 1pm on weekends: On Call pager [phone]

## 2020-12-12 DIAGNOSIS — I5033 Acute on chronic diastolic (congestive) heart failure: Secondary | ICD-10-CM | POA: Diagnosis not present

## 2020-12-12 LAB — COMPREHENSIVE METABOLIC PANEL
ALT: 21 U/L (ref 0–44)
AST: 27 U/L (ref 15–41)
Albumin: 4.7 g/dL (ref 3.5–5.0)
Alkaline Phosphatase: 68 U/L (ref 38–126)
Anion gap: 15 (ref 5–15)
BUN: 130 mg/dL — ABNORMAL HIGH (ref 8–23)
CO2: 25 mmol/L (ref 22–32)
Calcium: 9.8 mg/dL (ref 8.9–10.3)
Chloride: 85 mmol/L — ABNORMAL LOW (ref 98–111)
Creatinine, Ser: 2.22 mg/dL — ABNORMAL HIGH (ref 0.61–1.24)
GFR, Estimated: 32 mL/min — ABNORMAL LOW (ref >60)
Glucose, Bld: 112 mg/dL — ABNORMAL HIGH (ref 70–99)
Potassium: 4.4 mmol/L (ref 3.5–5.1)
Sodium: 125 mmol/L — ABNORMAL LOW (ref 135–145)
Total Bilirubin: 1 mg/dL (ref 0.3–1.2)
Total Protein: 7.1 g/dL (ref 6.5–8.1)

## 2020-12-12 LAB — BASIC METABOLIC PANEL
Anion gap: 14 (ref 5–15)
BUN: 139 mg/dL — ABNORMAL HIGH (ref 8–23)
CO2: 26 mmol/L (ref 22–32)
Calcium: 9.7 mg/dL (ref 8.9–10.3)
Chloride: 88 mmol/L — ABNORMAL LOW (ref 98–111)
Creatinine, Ser: 2.01 mg/dL — ABNORMAL HIGH (ref 0.61–1.24)
GFR, Estimated: 36 mL/min — ABNORMAL LOW (ref >60)
Glucose, Bld: 191 mg/dL — ABNORMAL HIGH (ref 70–99)
Potassium: 3.9 mmol/L (ref 3.5–5.1)
Sodium: 128 mmol/L — ABNORMAL LOW (ref 135–145)

## 2020-12-12 LAB — CBC
HCT: 31.6 % — ABNORMAL LOW (ref 39.0–52.0)
Hemoglobin: 10.6 g/dL — ABNORMAL LOW (ref 13.0–17.0)
MCH: 29.8 pg (ref 26.0–34.0)
MCHC: 33.5 g/dL (ref 30.0–36.0)
MCV: 88.8 fL (ref 80.0–100.0)
Platelets: 138 10*3/uL — ABNORMAL LOW (ref 150–400)
RBC: 3.56 MIL/uL — ABNORMAL LOW (ref 4.22–5.81)
RDW: 16.3 % — ABNORMAL HIGH (ref 11.5–15.5)
WBC: 5.9 10*3/uL (ref 4.0–10.5)
nRBC: 0 % (ref 0.0–0.2)

## 2020-12-12 LAB — HEPATIC FUNCTION PANEL
ALT: 22 U/L (ref 0–44)
AST: 28 U/L (ref 15–41)
Albumin: 4.7 g/dL (ref 3.5–5.0)
Alkaline Phosphatase: 75 U/L (ref 38–126)
Bilirubin, Direct: 0.2 mg/dL (ref 0.0–0.2)
Indirect Bilirubin: 0.8 mg/dL (ref 0.3–0.9)
Total Bilirubin: 1 mg/dL (ref 0.3–1.2)
Total Protein: 7.2 g/dL (ref 6.5–8.1)

## 2020-12-12 LAB — GLUCOSE, CAPILLARY
Glucose-Capillary: 98 mg/dL (ref 70–99)
Glucose-Capillary: 145 mg/dL — ABNORMAL HIGH (ref 70–99)
Glucose-Capillary: 146 mg/dL — ABNORMAL HIGH (ref 70–99)
Glucose-Capillary: 229 mg/dL — ABNORMAL HIGH (ref 70–99)
Glucose-Capillary: 181 mg/dL — ABNORMAL HIGH (ref 70–99)

## 2020-12-12 LAB — SODIUM: Sodium: 129 mmol/L — ABNORMAL LOW (ref 135–145)

## 2020-12-12 MED ORDER — TOLVAPTAN 15 MG PO TABS
30.0000 mg | ORAL_TABLET | Freq: Once | ORAL | Status: AC
  Administered 2020-12-12: 30 mg via ORAL
  Filled 2020-12-12: qty 2

## 2020-12-12 NOTE — Progress Notes (Signed)
OT Cancellation Note  Patient Details Name: Gregory Tanner MRN: [id number] DOB: [birth year]   Cancelled Treatment:    Reason Eval/Treat Not Completed: OT screened, no needs identified, will sign off. Advised by PT, no OT needs in the acute setting.    Alethia Melendrez D Joan Avetisyan 12/12/2020, 12:40 PM

## 2020-12-12 NOTE — Plan of Care (Signed)
  Problem: Health Behavior/Discharge Planning: Goal: Ability to manage health-related needs will improve Outcome: Progressing   Problem: Clinical Measurements: Goal: Ability to maintain clinical measurements within normal limits will improve Outcome: Progressing   Problem: Activity: Goal: Risk for activity intolerance will decrease Outcome: Progressing   Problem: Nutrition: Goal: Adequate nutrition will be maintained Outcome: Progressing   

## 2020-12-12 NOTE — Evaluation (Signed)
Physical Therapy Evaluation and Discharge  Patient Details Name: Gregory Tanner MRN: [id number] DOB: 1956/01/28 Today's Date: 12/12/2020  History of Present Illness  Pt is a 65 y.o. male admitted on 12/05/20 with worsening SOB and cough. Pt worked up for acute hypoxic respiratory failure seconday to RSV and complicated by heart failure. Right heart cath performed on 12/11/20. PMH includes diastolic heart failure, HTN, DM, CKD, liver disease.  Clinical Impression   Pt presents with decreased activity tolerance secondary to above. PTA, pt was independent in functional mobility and ADLs. Pt remains independent but is limited by cardiopulmonary fatigue. Encouraged pt to continue to remain active within his limits and provided handout on energy conservation strategies. DGI score of 19/24 does not show increased risk of falls in high level balance activities. No further acute PT services needed at this time.    Recommendations for follow up therapy are one component of a multi-disciplinary discharge planning process, led by the attending physician.  Recommendations may be updated based on patient status, additional functional criteria and insurance authorization.  Follow Up Recommendations No PT follow up    Assistance Recommended at Discharge PRN  Functional Status Assessment    Equipment Recommendations  None recommended by PT    Recommendations for Other Services       Precautions / Restrictions Precautions Precautions: Fall Restrictions Weight Bearing Restrictions: No      Mobility  Bed Mobility               General bed mobility comments: Pt received in standing    Transfers Overall transfer level: Independent Equipment used: None               General transfer comment: Pt transfered from sit <> stand independently    Ambulation/Gait Ambulation/Gait assistance: Modified independent (Device/Increase time) Gait Distance (Feet): 338 Feet Assistive device:  None Gait Pattern/deviations: Step-through pattern;Decreased stride length Gait velocity: decreased   General Gait Details: Pt ambulated without DME; decreased speed, limited due to cardiopulmonary fatigue  Stairs            Wheelchair Mobility    Modified Rankin (Stroke Patients Only)       Balance Overall balance assessment: Modified Independent                               Standardized Balance Assessment Standardized Balance Assessment : Dynamic Gait Index   Dynamic Gait Index Level Surface: Normal Change in Gait Speed: Mild Impairment Gait with Horizontal Head Turns: Mild Impairment Gait with Vertical Head Turns: Mild Impairment Gait and Pivot Turn: Normal Step Over Obstacle: Mild Impairment Step Around Obstacles: Normal Steps: Mild Impairment Total Score: 19       Pertinent Vitals/Pain Pain Assessment: Faces Faces Pain Scale: No hurt    Home Living Family/patient expects to be discharged to:: Private residence Living Arrangements: Spouse/significant other Available Help at Discharge: Family Type of Home: House Home Access: Stairs to enter Entrance Stairs-Rails: None Technical brewer of Steps: 1   Home Layout: One level Home Equipment: Shower seat Additional Comments: Pt has 1 large step to enter the house with no rails    Prior Function Prior Level of Function : Independent/Modified Independent;Driving             Mobility Comments: Pt ambulated independently without DME       Hand Dominance        Extremity/Trunk Assessment   Upper Extremity  Assessment Upper Extremity Assessment: Overall WFL for tasks assessed    Lower Extremity Assessment Lower Extremity Assessment: Overall WFL for tasks assessed       Communication   Communication: No difficulties  Cognition Arousal/Alertness: Awake/alert Behavior During Therapy: WFL for tasks assessed/performed Overall Cognitive Status: Within Functional Limits for  tasks assessed                                 General Comments: Pt alert and oriented x4        General Comments General comments (skin integrity, edema, etc.): Pre-therapy: SpO2 96, HR 98; during ambulation: SpO2 97, HR 95; post-ambulation: SpO2 98, HR 95; SpO2 all on room air; encouraged pt to remain active; educated on energy conservation strategies (handout provided)    Exercises     Assessment/Plan    PT Assessment Patient does not need any further PT services  PT Problem List         PT Treatment Interventions      PT Goals (Current goals can be found in the Care Plan section)  Acute Rehab PT Goals Patient Stated Goal: To return home and be independent with mobility and ADLs PT Goal Formulation: All assessment and education complete, DC therapy Time For Goal Achievement: 12/26/20 Potential to Achieve Goals: Good    Frequency     Barriers to discharge        Co-evaluation               AM-PAC PT "6 Clicks" Mobility  Outcome Measure Help needed turning from your back to your side while in a flat bed without using bedrails?: None Help needed moving from lying on your back to sitting on the side of a flat bed without using bedrails?: None Help needed moving to and from a bed to a chair (including a wheelchair)?: None Help needed standing up from a chair using your arms (e.g., wheelchair or bedside chair)?: None Help needed to walk in hospital room?: None Help needed climbing 3-5 steps with a railing? : A Little 6 Click Score: 23    End of Session Equipment Utilized During Treatment: Gait belt Activity Tolerance: Patient tolerated treatment well;Patient limited by fatigue Patient left: in chair;with call bell/phone within reach   PT Visit Diagnosis: Other abnormalities of gait and mobility (R26.89)    Time: 4097-3532 PT Time Calculation (min) (ACUTE ONLY): 25 min   Charges:   PT Evaluation $PT Eval Low Complexity: 1 Low           Brandon Melnick, SPT  Brandon Melnick 12/12/2020, 10:17 AM

## 2020-12-12 NOTE — Progress Notes (Signed)
Subjective:  Gregory Tanner is a 65 year old with a past medical history of right-sided heart failure with high cardiac output, pulmonary hypertension of unknown etiology (maybe portopulmonary HTN), cryptogenic cirrhosis c/b HCC s/p transplant on mycophenolate, IDDM, CKD Stage 3b who is admitted for acute hypoxic respiratory failure with respiratory distress.  Patient evaluated at bedside this morning.  Gregory Tanner states that his cough was getting better yesterday, however when Gregory Tanner was moving around the hospital getting his cath, the cough worsened.  Gregory Tanner says that the cough prevented him from sleeping last night.  Gregory Tanner does believe that the prednisone alleviates his cough.  Gregory Tanner is unsure whether his nebulizer treatments have helped but is willing to try them again.  There are no other complaints or concerns at this time.  Objective:  Vital signs in last 24 hours: Vitals:   12/11/20 1900 12/11/20 2012 12/12/20 0001 12/12/20 0342  BP: 133/60 (!) 131/96 (!) 116/55 (!) 117/57  Pulse: 97 95 92 86  Resp:  20 20 20   Temp:  98.3 F (36.8 C) 97.8 F (36.6 C) 97.8 F (36.6 C)  TempSrc:  Oral  Oral  SpO2:  94% 94% 97%  Weight:    83.8 kg  Height:       I/O's: I: 655.1 mL O: 1.8 L Net - 10 L since admit Weight 83.8 kg from 84.9 kg yesterday  Physical Exam: Constitutional: Ill-appearing, mild coughing during exam, NAD on RA HENT: Normocephalic and atraumatic, EOMI, conjunctiva normal Cardiovascular: Normal rate, regular rhythm, S1 and S2 present, no murmurs, rubs, gallops.  Respiratory: Normal work of breathing. No accessory muscle usage. CTAB. GI: Moderately distended, nontender to palpation, normal active bowel sounds Musculoskeletal: Normal bulk and tone. Unna boots intact. 1-2+ bilateral pitting edema bilaterally. Neurological: Is alert and oriented x4, no apparent focal deficits noted. Skin: Warm and dry. Psychiatric: Normal mood and affect. Behavior is normal. Judgment and thought  content normal.   Assessment/Plan:  Active Problems:   Acute on chronic diastolic CHF (congestive heart failure) (HCC)   Acute exacerbation of CHF (congestive heart failure) ([place])   Acute respiratory distress  Gregory Tanner is a 65 year old gentleman with a past medical history of right-sided heart failure with high cardiac output, pulmonary hypertension of unknown etiology, cryptogenic cirrhosis c/b HCC s/p transplant on mycophenolate, IDDM, CKD Stage 3b who presents to the ED with complaints of shortness of breath with cough currently admitted for acute hypoxic respiratory failure with respiratory distress.   # Acute Hypoxic Respiratory Failure  # Acute Respiratory Distress  # RSV Increased shortness of breath onset about 2 weeks ago. Continues to have significant cough on exam. Saturation of 96% on RA. Respiratory viral panel found to be positive for RSV. Gregory Tanner remains afebrile with no leukocytosis. Endorses worsening of his cough since yesterday, however Gregory Tanner notes that prednisone alleviated his cough yesterday.  Gregory Tanner is willing to try nebulizer treatments as well.  Will continue supportive management, as below, at this time.  - Continue tessalon Perles 200 mg 3 times daily -- Tussionex 5 mL p.o. twice daily for cough -- Duonebs q6h PRN -- Prednisone 40 mg PO daily - Supplemental O2 as needed to maintain saturations >92%  # Right-sided Heart Failure # HFpEF  Patient with a complicated heart failure picture for which Gregory Tanner has had extensive work-up at [facility]. On his last admission here, Gregory Tanner diuresed over 20 L of fluid and returned within 48 hours with worsening shortness of breath. Please see H&P  for more details.   The patient continues to appear hypervolemic on exam. Na 125. Today, output of 1.8 L, Net -10 L since admit. Weight has been stable around 83.8 kg, which is lower than his dry weight of ~86 kg. Cr currently at 2.22, BUN continues to be elevated. Given his complex picture, we have  consulted the heart failure team for their recommendations.   Patient is status post catheterization on 10/28. Results showed mildly elevated PCWP, predominantly right-sided heart failure, possible ASD. Given findings, will continue to carefully diurese and plan for TEE on Monday, 10/31. Otherwise, plan as below:  -- Cardiology on board, appreciate their recommendations -- Plan for TEE on Monday, 10/31 to assess for ASD -- Unna boots, bilateral -- Renal diet with 1200 mL fluid restriction -- Lasix gtt @ 10 mg/hr, will give 30 mg tolvaptan 1 x again today -- Monitor I/O's, weights -- Flutter valve  # CKD Stage 3b  Creatinine at 2.22 today. Baseline between 2.2 - 2.5.  - Monitor renal function - Strict I/Os   # Cryptogenic Cirrhosis c/b HCC s/p transplant (2013) - Continue mycophenolate 1,000 mg twice daily   # Insulin-Dependent Type 2 Diabetes Mellitus  A1c 5.2, CBGs well controlled during this admission. - SSI  # Pulmonary Hypertension  Etiology uncertain. MRI in February with portal hypertension with splenomegaly, ascites and enlarged venous vasculature suggestive of portopulmonary hypertension.   - Will need outpatient follow up with pulmonary hypertension clinic at [facility] (previously referred, but patient has not arranged an appointment).  #Thoracic Aortic aneurysm 4.0 cm aneurysm of the ascending aorta. Incidental finding on CT chest on 10/22.  -Will need annual CTA/MRA for surveillance  #PAD Right ABI 1.04, Left ABI 0.73. Started on a moderate-intensity statin that does not interact with mycophenolate. -Continue atorvastatin 20 mg po daily  Diet: Renal with 1200 mL fluid restriction VTE: Enoxaparin IVF: None Code: Full  Prior to Admission Living Arrangement: Home, living with his wife Anticipated Discharge Location:  Home Barriers to Discharge: Continued medical stabilization, TEE on 10/31 Dispo: Anticipated discharge pending medical stabilization, TEE on  10/31  [name], MD 12/12/2020, 9:36 AM Pager: [phone] After 5pm on weekdays and 1pm on weekends: On Call pager [phone]

## 2020-12-12 NOTE — Progress Notes (Signed)
Patient ID: Gregory Tanner, male   DOB: Jun 19, 1955, 65 y.o.   MRN: [id number]     Advanced Heart Failure Rounding Note  PCP-Cardiologist: Shirlee More, MD   Subjective:    SCr 2.05>>2.40>>2.16>>2.22. BUN 130.   Received Tolvaptan yesterday. Na 125. LFTs normal.   Currently on Lasix gtt 10 mg/hr, weight down 3 lbs.   Still coughing, was started on prednisone.   RHC Procedural Findings: Hemodynamics (mmHg) RA mean 15 with prominent v-waves to 21 RV 65/12 PA 66/25, mean 43 PCWP mean 17 Oxygen saturations: SVC 67% RA 84% RV 83% PA 85% PVs 94% AO 95% Cardiac Output (Fick) 19  Cardiac Index (Fick) 9.58 PVR 1.4 WU  PAPI 2.7  Qp/Qs 3.1  Objective:   Weight Range: 83.8 kg Body mass index is 26.52 kg/m.   Vital Signs:   Temp:  [97.5 F (36.4 C)-98.3 F (36.8 C)] 97.8 F (36.6 C) (10/29 0342) Pulse Rate:  [81-106] 91 (10/29 0938) Resp:  [4-67] 20 (10/29 0342) BP: (112-148)/(46-96) 143/46 (10/29 0938) SpO2:  [93 %-98 %] 97 % (10/29 0938) Weight:  [83.8 kg] 83.8 kg (10/29 0342) Last BM Date: 12/11/20  Weight change: Filed Weights   12/11/20 0050 12/11/20 0500 12/12/20 0342  Weight: 84.9 kg 84.9 kg 83.8 kg    Intake/Output:   Intake/Output Summary (Last 24 hours) at 12/12/2020 1003 Last data filed at 12/12/2020 0938 Gross per 24 hour  Intake 845.08 ml  Output 2450 ml  Net -1604.92 ml      Physical Exam    General: NAD Neck: JVP 10-12 cm, no thyromegaly or thyroid nodule.  Lungs: Clear to auscultation bilaterally with normal respiratory effort. CV: Nondisplaced PMI.  Heart regular S1/S2, no S3/S4, 2/6 HSM LLSB.  2+ edema to knees.  Abdomen: Soft, nontender, no hepatosplenomegaly, no distention.  Skin: Intact without lesions or rashes.  Neurologic: Alert and oriented x 3.  Psych: Normal affect. Extremities: No clubbing or cyanosis.  HEENT: Normal.    Telemetry   NSR 80s-90s (personally reviewed)  Labs    CBC Recent Labs    12/11/20 1648  12/12/20 0338  WBC  --  5.9  HGB 11.2* 10.6*  HCT 33.0* 31.6*  MCV  --  88.8  PLT  --  627*   Basic Metabolic Panel Recent Labs    12/11/20 1912 12/12/20 0338  NA 126* 125*  K 4.4 4.4  CL 85* 85*  CO2 25 25  GLUCOSE 276* 112*  BUN 127* 130*  CREATININE 2.22* 2.22*  CALCIUM 9.9 9.8   Liver Function Tests Recent Labs    12/11/20 0921 12/12/20 0338  AST 28 27  ALT 22 21  ALKPHOS 70 68  BILITOT 1.4* 1.0  PROT 7.1 7.1  ALBUMIN 4.7 4.7   No results for input(s): LIPASE, AMYLASE in the last 72 hours. Cardiac Enzymes No results for input(s): CKTOTAL, CKMB, CKMBINDEX, TROPONINI in the last 72 hours.  BNP: BNP (last 3 results) Recent Labs    11/22/20 0622 11/29/20 1015 12/05/20 0510  BNP 352.1* 222.9* 237.7*    ProBNP (last 3 results) No results for input(s): PROBNP in the last 8760 hours.   D-Dimer No results for input(s): DDIMER in the last 72 hours. Hemoglobin A1C No results for input(s): HGBA1C in the last 72 hours. Fasting Lipid Panel No results for input(s): CHOL, HDL, LDLCALC, TRIG, CHOLHDL, LDLDIRECT in the last 72 hours. Thyroid Function Tests No results for input(s): TSH, T4TOTAL, T3FREE, THYROIDAB in the last 72 hours.  Invalid input(s): FREET3  Other results:   Imaging    CARDIAC CATHETERIZATION  Result Date: 12/11/2020 1. Mildly elevated PCWP, more significantly elevated RA pressure.  Primarily RV failure, PAPi ok at 2.7. 2. Pulmonary venous hypertension in setting of high cardiac output. 3. Significant step up between SVC and RA, suggesting ASD.  Qp/Qs 3.1.  Continue to carefully diurese.  I will arrange for TEE on Monday.     Medications:     Scheduled Medications:  aspirin EC  81 mg Oral Daily   atorvastatin  20 mg Oral Daily   benzonatate  200 mg Oral TID   chlorpheniramine-HYDROcodone  5 mL Oral Q12H   docusate sodium  100 mg Oral BID   enoxaparin (LOVENOX) injection  40 mg Subcutaneous Q24H   hydrALAZINE  25 mg Oral q  morning   And   hydrALAZINE  12.5 mg Oral QHS   insulin aspart  0-9 Units Subcutaneous TID WC   mycophenolate  1,000 mg Oral BID   predniSONE  40 mg Oral Q breakfast   sodium chloride flush  3 mL Intravenous Q12H   sodium chloride flush  3 mL Intravenous Q12H   tolvaptan  30 mg Oral Once    Infusions:  sodium chloride 10 mL/hr at 12/12/20 0601   furosemide (LASIX) 200 mg in dextrose 5% 100 mL (2mg /mL) infusion 10 mg/hr (12/12/20 0402)    PRN Medications: sodium chloride, acetaminophen **OR** acetaminophen, alum & mag hydroxide-simeth, dextromethorphan-guaiFENesin, ipratropium-albuterol, lip balm, menthol-cetylpyridinium, methocarbamol, ondansetron (ZOFRAN) IV, phenol, polyethylene glycol    Patient Profile   65 y/o male w/ h/o NAFLD and hepatocellular carcinoma with liver transplant.  Now with RV failure in the setting of high output HF, has had extensive workup at [facility] with no cardiac or peripheral shunt discovered.  CKD stage III with creatinine 2.5 at baseline.    He had a recent admission here with CHF, not seen by cardiology.  Readmitted < 1 week post-discharge with wheezing and dyspnea, found to have recurrent CHF as well as RSV infection.   Assessment/Plan   1. Acute on chronic diastolic CHF with prominent RV failure: High output HF by last RHC in 12/21 at [facility].  Cardiac MRI in 12/21 at [facility] showed EF 74%, D-shaped septum, enlarged RV with RV EF 37%, Qp/Qs 1 with no evidence for significant shunt lesion, no pulmonary vein anomalies.  Echo this admission with normal EF 65-70%, moderate RV enlargement/mildly decreased RV systolic function, moderate TR.  MRI abdomen in 2/22 showed no evidence for AV shunt or fistula.   He has had extensive workup at [facility] with no definite intra- or extra-cardiac shunt lesion found to explain high output. He has struggled with RV failure, admitted markedly volume overloaded but this was complicated by cardiorenal syndrome with markedly high BUN.  RHC  yesterday with mildly elevated PCWP, more significantly elevated RA pressure, primarily RV failure, PAPi ok at 2.7, pulmonary venous hypertension in setting of high cardiac output. Also noted significant step up between SVC and RA, suggesting ASD with Qp/Qs 3.1. Today, still right-sided HF on exam. Renal indices stable.  - C/w unna boots (ABIs not markedly abnormal).  - Continues on Lasix gtt 10 mg/hr.  - Tolvaptan 30 mg x 1 with Na still 125.  - US abdomen with no drainable ascites - SGLT2 inhibitor stopped due to perineal yeast infection.  - TEE on Monday to assess for ASD.  Discussed risks/benefits with patient and he agrees to procedure.  2. CKD  stage 3: Stable renal indices but still very high BUN. Suspect cardiorenal syndrome.  - BP stable, does not appear to need midodrine.  3. Hyponatremia:  Hypervolemic hyponatremia.  - Fluid restrict. - Dose of tolvaptan again today.  4. H/o liver transplant: Followed at [facility] On mycophenolate.  Off tacrolimus due to side effects.  5. PAD: Moderate on left. Will need statin that does not interact with immunosuppression.  6. RSV: continues w/ persistent cough, modest improvement - continue cough regimen, now on prednisone.   Length of Stay: [place], MD  12/12/2020, 10:03 AM  Advanced Heart Failure Team Pager [phone] (M-F; 7a - 5p)  Please contact [facility] for night-coverage after hours (5p -7a ) and weekends on [email or web address]

## 2020-12-13 DIAGNOSIS — J9601 Acute respiratory failure with hypoxia: Secondary | ICD-10-CM | POA: Diagnosis not present

## 2020-12-13 DIAGNOSIS — I5033 Acute on chronic diastolic (congestive) heart failure: Secondary | ICD-10-CM | POA: Diagnosis not present

## 2020-12-13 LAB — CBC
HCT: 32.5 % — ABNORMAL LOW (ref 39.0–52.0)
Hemoglobin: 10.8 g/dL — ABNORMAL LOW (ref 13.0–17.0)
MCH: 30.2 pg (ref 26.0–34.0)
MCHC: 33.2 g/dL (ref 30.0–36.0)
MCV: 90.8 fL (ref 80.0–100.0)
Platelets: 120 10*3/uL — ABNORMAL LOW (ref 150–400)
RBC: 3.58 MIL/uL — ABNORMAL LOW (ref 4.22–5.81)
RDW: 16.2 % — ABNORMAL HIGH (ref 11.5–15.5)
WBC: 5.8 10*3/uL (ref 4.0–10.5)
nRBC: 0 % (ref 0.0–0.2)

## 2020-12-13 LAB — COMPREHENSIVE METABOLIC PANEL
ALT: 21 U/L (ref 0–44)
AST: 27 U/L (ref 15–41)
Albumin: 4.7 g/dL (ref 3.5–5.0)
Alkaline Phosphatase: 68 U/L (ref 38–126)
Anion gap: 14 (ref 5–15)
BUN: 138 mg/dL — ABNORMAL HIGH (ref 8–23)
CO2: 25 mmol/L (ref 22–32)
Calcium: 9.7 mg/dL (ref 8.9–10.3)
Chloride: 89 mmol/L — ABNORMAL LOW (ref 98–111)
Creatinine, Ser: 2.04 mg/dL — ABNORMAL HIGH (ref 0.61–1.24)
GFR, Estimated: 35 mL/min — ABNORMAL LOW (ref >60)
Glucose, Bld: 133 mg/dL — ABNORMAL HIGH (ref 70–99)
Potassium: 3.8 mmol/L (ref 3.5–5.1)
Sodium: 128 mmol/L — ABNORMAL LOW (ref 135–145)
Total Bilirubin: 1 mg/dL (ref 0.3–1.2)
Total Protein: 7.1 g/dL (ref 6.5–8.1)

## 2020-12-13 LAB — GLUCOSE, CAPILLARY
Glucose-Capillary: 141 mg/dL — ABNORMAL HIGH (ref 70–99)
Glucose-Capillary: 133 mg/dL — ABNORMAL HIGH (ref 70–99)
Glucose-Capillary: 187 mg/dL — ABNORMAL HIGH (ref 70–99)
Glucose-Capillary: 170 mg/dL — ABNORMAL HIGH (ref 70–99)

## 2020-12-13 LAB — SODIUM: Sodium: 129 mmol/L — ABNORMAL LOW (ref 135–145)

## 2020-12-13 MED ORDER — SODIUM CHLORIDE 0.9 % IV SOLN: INTRAVENOUS | Status: DC

## 2020-12-13 NOTE — Plan of Care (Signed)

## 2020-12-13 NOTE — Progress Notes (Signed)
Patient ID: Gregory Tanner, male   DOB: [birth year], 65 y.o.   MRN: [id number]     Advanced Heart Failure Rounding Note  PCP-Cardiologist: Shirlee More, MD   Subjective:    SCr 2.05>>2.40>>2.16>>2.22>>2.04. BUN 130>>138.   Received Tolvaptan yesterday. Na 128. LFTs normal.   Currently on Lasix gtt 10 mg/hr, I/Os net negative 1796.   Still coughing, was started on prednisone. Hot tea helps the cough.   RHC Procedural Findings: Hemodynamics (mmHg) RA mean 15 with prominent v-waves to 21 RV 65/12 PA 66/25, mean 43 PCWP mean 17 Oxygen saturations: SVC 67% RA 84% RV 83% PA 85% PVs 94% AO 95% Cardiac Output (Fick) 19  Cardiac Index (Fick) 9.58 PVR 1.4 WU  PAPI 2.7  Qp/Qs 3.1  Objective:   Weight Range: 84.1 kg Body mass index is 26.6 kg/m.   Vital Signs:   Temp:  [97.3 F (36.3 C)-97.7 F (36.5 C)] 97.5 F (36.4 C) (10/30 0410) Pulse Rate:  [90-94] 94 (10/30 0410) Resp:  [16-20] 20 (10/30 0410) BP: (115-128)/(56-65) 124/65 (10/30 0410) SpO2:  [97 %-98 %] 97 % (10/30 0410) Weight:  [84.1 kg] 84.1 kg (10/30 0410) Last BM Date: 12/11/20  Weight change: Filed Weights   12/11/20 0500 12/12/20 0342 12/13/20 0410  Weight: 84.9 kg 83.8 kg 84.1 kg    Intake/Output:   Intake/Output Summary (Last 24 hours) at 12/13/2020 1008 Last data filed at 12/13/2020 0711 Gross per 24 hour  Intake 1553.67 ml  Output 2725 ml  Net -1171.33 ml      Physical Exam    General: NAD Neck: JVP 10-12 cm, no thyromegaly or thyroid nodule.  Lungs: Clear to auscultation bilaterally with normal respiratory effort. CV: Nondisplaced PMI.  Heart regular S1/S2, no S3/S4, 2/6 HSM LLSB.  2+ edema to knees.   Abdomen: Soft, nontender, no hepatosplenomegaly, no distention.  Skin: Intact without lesions or rashes.  Neurologic: Alert and oriented x 3.  Psych: Normal affect. Extremities: No clubbing or cyanosis.  HEENT: Normal.    Telemetry   NSR 80s-90s (personally reviewed)  Labs     CBC Recent Labs    12/12/20 0338 12/13/20 0122  WBC 5.9 5.8  HGB 10.6* 10.8*  HCT 31.6* 32.5*  MCV 88.8 90.8  PLT 138* 086*   Basic Metabolic Panel Recent Labs    12/12/20 1744 12/13/20 0122  NA 128*  129* 128*  129*  K 3.9 3.8  CL 88* 89*  CO2 26 25  GLUCOSE 191* 133*  BUN 139* 138*  CREATININE 2.01* 2.04*  CALCIUM 9.7 9.7   Liver Function Tests Recent Labs    12/12/20 1744 12/13/20 0122  AST 28 27  ALT 22 21  ALKPHOS 75 68  BILITOT 1.0 1.0  PROT 7.2 7.1  ALBUMIN 4.7 4.7   No results for input(s): LIPASE, AMYLASE in the last 72 hours. Cardiac Enzymes No results for input(s): CKTOTAL, CKMB, CKMBINDEX, TROPONINI in the last 72 hours.  BNP: BNP (last 3 results) Recent Labs    11/22/20 0622 11/29/20 1015 12/05/20 0510  BNP 352.1* 222.9* 237.7*    ProBNP (last 3 results) No results for input(s): PROBNP in the last 8760 hours.   D-Dimer No results for input(s): DDIMER in the last 72 hours. Hemoglobin A1C No results for input(s): HGBA1C in the last 72 hours. Fasting Lipid Panel No results for input(s): CHOL, HDL, LDLCALC, TRIG, CHOLHDL, LDLDIRECT in the last 72 hours. Thyroid Function Tests No results for input(s): TSH, T4TOTAL, T3FREE, THYROIDAB in  the last 72 hours.  Invalid input(s): FREET3  Other results:   Imaging    No results found.   Medications:     Scheduled Medications:  aspirin EC  81 mg Oral Daily   atorvastatin  20 mg Oral Daily   benzonatate  200 mg Oral TID   chlorpheniramine-HYDROcodone  5 mL Oral Q12H   docusate sodium  100 mg Oral BID   enoxaparin (LOVENOX) injection  40 mg Subcutaneous Q24H   hydrALAZINE  25 mg Oral q morning   And   hydrALAZINE  12.5 mg Oral QHS   insulin aspart  0-9 Units Subcutaneous TID WC   mycophenolate  1,000 mg Oral BID   predniSONE  40 mg Oral Q breakfast   sodium chloride flush  3 mL Intravenous Q12H   sodium chloride flush  3 mL Intravenous Q12H    Infusions:  sodium  chloride 10 mL/hr at 12/12/20 0601   furosemide (LASIX) 200 mg in dextrose 5% 100 mL (2mg /mL) infusion 10 mg/hr (12/13/20 0709)    PRN Medications: sodium chloride, acetaminophen **OR** acetaminophen, alum & mag hydroxide-simeth, dextromethorphan-guaiFENesin, ipratropium-albuterol, lip balm, menthol-cetylpyridinium, methocarbamol, ondansetron (ZOFRAN) IV, phenol, polyethylene glycol    Patient Profile   65 y/o male w/ h/o NAFLD and hepatocellular carcinoma with liver transplant.  Now with RV failure in the setting of high output HF, has had extensive workup at [facility] with no cardiac or peripheral shunt discovered.  CKD stage III with creatinine 2.5 at baseline.    He had a recent admission here with CHF, not seen by cardiology.  Readmitted < 1 week post-discharge with wheezing and dyspnea, found to have recurrent CHF as well as RSV infection.   Assessment/Plan   1. Acute on chronic diastolic CHF with prominent RV failure: High output HF by last RHC in 12/21 at [facility].  Cardiac MRI in 12/21 at [facility] showed EF 74%, D-shaped septum, enlarged RV with RV EF 37%, Qp/Qs 1 with no evidence for significant shunt lesion, no pulmonary vein anomalies.  Echo this admission with normal EF 65-70%, moderate RV enlargement/mildly decreased RV systolic function, moderate TR.  MRI abdomen in 2/22 showed no evidence for AV shunt or fistula.   He has had extensive workup at [facility] with no definite intra- or extra-cardiac shunt lesion found to explain high output. He has struggled with RV failure, admitted markedly volume overloaded but this was complicated by cardiorenal syndrome with markedly high BUN.  RHC yesterday with mildly elevated PCWP, more significantly elevated RA pressure, primarily RV failure, PAPi ok at 2.7, pulmonary venous hypertension in setting of high cardiac output. Also noted significant step up between SVC and RA, suggesting ASD with Qp/Qs 3.1. Today, still right-sided HF on exam. Renal indices stable.   - C/w unna boots (ABIs not markedly abnormal).  - Continues on Lasix gtt 10 mg/hr today, possibly to po tomorrow after TEE.   - US abdomen with no drainable ascites - SGLT2 inhibitor stopped due to perineal yeast infection.  - TEE on Monday to assess for ASD.  Discussed risks/benefits with patient and he agrees to procedure.  2. CKD stage 3: Stable renal indices but still very high BUN. Suspect cardiorenal syndrome.  - BP stable, does not appear to need midodrine.  3. Hyponatremia:  Hypervolemic hyponatremia. Na up to 128.  - Fluid restrict. 4. H/o liver transplant: Followed at [facility]. On mycophenolate.  Off tacrolimus due to side effects.  5. PAD: Moderate on left. Will need statin that does  not interact with immunosuppression.  6. RSV: continues w/ persistent cough, modest improvement - continue cough regimen, now on prednisone.   Length of Stay: 6  Loralie Champagne, MD  12/13/2020, 10:08 AM  Advanced Heart Failure Team Pager [phone] (M-F; 7a - 5p)  Please contact [facility] for night-coverage after hours (5p -7a ) and weekends on [email or web address]

## 2020-12-13 NOTE — Progress Notes (Signed)
Subjective: No acute overnight events. Patient was seen at bedside during rounds this morning. Pt reports cough is much better this AM with hot tea with lemon and honey soothing his throat. Pt denies fever and chills. Pt is understanding that he will undergo TEE tomorrow morning. There are no other complains or concerns at this time.   Objective:  Vital signs in last 24 hours: Vitals:   12/12/20 0938 12/12/20 1634 12/12/20 1912 12/13/20 0410  BP: (!) 143/46 (!) 115/59 (!) 128/56 124/65  Pulse: 91 90 94 94  Resp:  16 20 20   Temp:  (!) 97.3 F (36.3 C) 97.7 F (36.5 C) (!) 97.5 F (36.4 C)  TempSrc:  Oral Oral Oral  SpO2: 97% 98% 97% 97%  Weight:    84.1 kg  Height:       Physical exam: Constitutional: Ill-appearing, mild coughing during exam, NAD on RA HENT: Normocephalic and atraumatic, EOMI, conjunctiva normal Cardiovascular: Normal rate, regular rhythm, S1 and S2 present, no murmurs, rubs, gallops.  Respiratory: Normal work of breathing. No accessory muscle usage. CTAB. GI: Moderately distended, nontender to palpation, normal active bowel sounds Musculoskeletal: Normal bulk and tone. Unna boots intact. 1+ bilateral pitting edema bilaterally, L>R. Neurological: Is alert and oriented x4, no apparent focal deficits noted. Skin: Warm and dry. Psychiatric: Normal mood and affect. Behavior is normal. Judgment and thought content normal.   Assessment/Plan:  Active Problems:   Acute on chronic diastolic CHF (congestive heart failure) (HCC)   Acute exacerbation of CHF (congestive heart failure) (HCC)   Acute respiratory distress   # Acute Hypoxic Respiratory Failure # Acute Respiratory Distress # RSV  Increased shortness of breath onset about 2 weeks ago. Patient notes that his cough has improved during his hospitalization. Saturation of 98% on RA. Respiratory viral panel found to be positive for RSV. He remains afebrile with no leukocytosis. Will continue supportive  management, as below, at this time.   - Continue tessalon Perles 200 mg 3 times daily -- Tussionex 5 mL p.o. twice daily for cough -- Duonebs q6h PRN -- Prednisone 40 mg PO daily - Supplemental O2 as needed to maintain saturations >92%   # Right-sided Heart Failure # HFpEF Patient with a complicated heart failure picture for which he has had extensive work-up at [facility]. During his last admission here, he diuresed over 20 L of fluid and returned within 48 hours with worsening shortness of breath. Please see H&P for more details.    The patient continues to appear hypervolemic on exam though the patient notes that his volume status is "the best it is going to get." Na 128. Today, output of 3.3 L, Net -11.8 L since admit. Weight has been stable around 84.1 kg, which is lower than his dry weight of ~86 kg. Cr currently at 2.04, BUN continues to be elevated. Given his complex picture, we have consulted the heart failure team for their recommendations.    Patient is status post catheterization on 10/28. Results showed mildly elevated PCWP, predominantly right-sided heart failure, possible ASD. Given findings, will continue to carefully diurese and plan for TEE on Monday, 10/31. Otherwise, plan as below:   -- Cardiology on board, appreciate their recommendations -- Plan for TEE on Monday, 10/31 to assess for ASD -- Unna boots, bilateral -- Renal diet with 1200 mL fluid restriction -- Lasix gtt @ 10 mg/hr, possible transition to po tomorrow -- Monitor I/O's, weights -- Flutter valve  # CKD Stage 3b Creatinine at 2.02  today. Baseline between 2.2 - 2.5.  - Monitor renal function - Strict I/Os  # Cryptogenic Cirrhosis c/b HCC s/p transplant (2013) - Continue mycophenolate 1,000 mg twice daily  # Insulin-Dependent Type 2 Diabetes Mellitus A1c 5.2, CBGs well controlled during this admission. - SSI  # Pulmonary Hypertension  Etiology uncertain. MRI in February with portal hypertension with  splenomegaly, ascites and enlarged venous vasculature suggestive of portopulmonary hypertension.   - Will need outpatient follow up with pulmonary hypertension clinic at [facility] (previously referred, but patient has not arranged an appointment).   #Thoracic Aortic aneurysm 4.0 cm aneurysm of the ascending aorta. Incidental finding on CT chest on 10/22.  -Will need annual CTA/MRA for surveillance   #PAD Right ABI 1.04, Left ABI 0.73. Started on a moderate-intensity statin that does not interact with mycophenolate. -Continue atorvastatin 20 mg po daily  Diet: Renal with 1200 mL fluid restriction VTE: Enoxaparin IVF: None Code: Full   Prior to Admission Living Arrangement: Home, living with his wife Anticipated Discharge Location:  Home Barriers to Discharge: Continued medical stabilization, TEE on 10/31 Dispo: Anticipated discharge pending medical stabilization, TEE on 10/31  Signature: Mitzie Na, MD  Internal Medicine Resident, PGY-1 [facility]  Pager: [phone] 9:33 AM, 12/13/2020  After 5pm on weekdays and 1pm on weekends: On Call pager [phone]

## 2020-12-14 ENCOUNTER — Inpatient Hospital Stay (HOSPITAL_COMMUNITY): Payer: Medicare Other | Admitting: Certified Registered Nurse Anesthetist

## 2020-12-14 ENCOUNTER — Encounter (HOSPITAL_COMMUNITY): Payer: Self-pay | Admitting: Cardiology

## 2020-12-14 ENCOUNTER — Encounter (HOSPITAL_COMMUNITY)
Admission: EM | Disposition: A | Discharge status: Home Health | Payer: Self-pay | Source: Home / Self Care | Attending: Internal Medicine

## 2020-12-14 ENCOUNTER — Inpatient Hospital Stay (HOSPITAL_COMMUNITY): Payer: Medicare Other

## 2020-12-14 DIAGNOSIS — J9601 Acute respiratory failure with hypoxia: Secondary | ICD-10-CM | POA: Diagnosis not present

## 2020-12-14 DIAGNOSIS — I361 Nonrheumatic tricuspid (valve) insufficiency: Secondary | ICD-10-CM

## 2020-12-14 DIAGNOSIS — I5033 Acute on chronic diastolic (congestive) heart failure: Secondary | ICD-10-CM | POA: Diagnosis not present

## 2020-12-14 HISTORY — PX: TEE WITHOUT CARDIOVERSION: SHX5443

## 2020-12-14 HISTORY — PX: BUBBLE STUDY: SHX6837

## 2020-12-14 LAB — COMPREHENSIVE METABOLIC PANEL
ALT: 22 U/L (ref 0–44)
AST: 24 U/L (ref 15–41)
Albumin: 4.6 g/dL (ref 3.5–5.0)
Alkaline Phosphatase: 57 U/L (ref 38–126)
Anion gap: 12 (ref 5–15)
BUN: 133 mg/dL — ABNORMAL HIGH (ref 8–23)
CO2: 29 mmol/L (ref 22–32)
Calcium: 9.8 mg/dL (ref 8.9–10.3)
Chloride: 90 mmol/L — ABNORMAL LOW (ref 98–111)
Creatinine, Ser: 1.89 mg/dL — ABNORMAL HIGH (ref 0.61–1.24)
GFR, Estimated: 38 mL/min — ABNORMAL LOW (ref >60)
Glucose, Bld: 125 mg/dL — ABNORMAL HIGH (ref 70–99)
Potassium: 3.2 mmol/L — ABNORMAL LOW (ref 3.5–5.1)
Sodium: 131 mmol/L — ABNORMAL LOW (ref 135–145)
Total Bilirubin: 1 mg/dL (ref 0.3–1.2)
Total Protein: 6.9 g/dL (ref 6.5–8.1)

## 2020-12-14 LAB — CBC
HCT: 33.8 % — ABNORMAL LOW (ref 39.0–52.0)
Hemoglobin: 10.9 g/dL — ABNORMAL LOW (ref 13.0–17.0)
MCH: 29.5 pg (ref 26.0–34.0)
MCHC: 32.2 g/dL (ref 30.0–36.0)
MCV: 91.4 fL (ref 80.0–100.0)
Platelets: 115 10*3/uL — ABNORMAL LOW (ref 150–400)
RBC: 3.7 MIL/uL — ABNORMAL LOW (ref 4.22–5.81)
RDW: 16.1 % — ABNORMAL HIGH (ref 11.5–15.5)
WBC: 5.2 10*3/uL (ref 4.0–10.5)
nRBC: 0 % (ref 0.0–0.2)

## 2020-12-14 LAB — GLUCOSE, CAPILLARY
Glucose-Capillary: 118 mg/dL — ABNORMAL HIGH (ref 70–99)
Glucose-Capillary: 138 mg/dL — ABNORMAL HIGH (ref 70–99)
Glucose-Capillary: 229 mg/dL — ABNORMAL HIGH (ref 70–99)
Glucose-Capillary: 124 mg/dL — ABNORMAL HIGH (ref 70–99)

## 2020-12-14 SURGERY — ECHOCARDIOGRAM, TRANSESOPHAGEAL
Anesthesia: Monitor Anesthesia Care

## 2020-12-14 MED ORDER — POTASSIUM CHLORIDE CRYS ER 20 MEQ PO TBCR
40.0000 meq | EXTENDED_RELEASE_TABLET | Freq: Two times a day (BID) | ORAL | Status: DC
  Administered 2020-12-14 – 2020-12-16 (×5): 40 meq via ORAL
  Filled 2020-12-14 (×5): qty 2

## 2020-12-14 MED ORDER — PROPOFOL 10 MG/ML IV BOLUS
INTRAVENOUS | Status: DC | PRN
  Administered 2020-12-14: 10 mg via INTRAVENOUS
  Administered 2020-12-14: 20 mg via INTRAVENOUS
  Administered 2020-12-14 (×2): 10 mg via INTRAVENOUS

## 2020-12-14 MED ORDER — LACTATED RINGERS IV SOLN
INTRAVENOUS | Status: AC | PRN
  Administered 2020-12-14: 1000 mL via INTRAVENOUS

## 2020-12-14 MED ORDER — PROPOFOL 500 MG/50ML IV EMUL
INTRAVENOUS | Status: DC | PRN
  Administered 2020-12-14: 100 ug/kg/min via INTRAVENOUS

## 2020-12-14 MED ORDER — LIDOCAINE 2% (20 MG/ML) 5 ML SYRINGE
INTRAMUSCULAR | Status: DC | PRN
  Administered 2020-12-14: 80 mg via INTRAVENOUS

## 2020-12-14 MED ORDER — LORAZEPAM 0.5 MG PO TABS: 0.5000 mg | ORAL_TABLET | Freq: Once | ORAL | Status: DC

## 2020-12-14 MED ORDER — POTASSIUM CHLORIDE CRYS ER 20 MEQ PO TBCR: 40.0000 meq | EXTENDED_RELEASE_TABLET | Freq: Two times a day (BID) | ORAL | Status: DC

## 2020-12-14 MED ORDER — PHENYLEPHRINE 40 MCG/ML (10ML) SYRINGE FOR IV PUSH (FOR BLOOD PRESSURE SUPPORT)
PREFILLED_SYRINGE | INTRAVENOUS | Status: DC | PRN
  Administered 2020-12-14 (×5): 80 ug via INTRAVENOUS

## 2020-12-14 NOTE — Anesthesia Postprocedure Evaluation (Signed)
Anesthesia Post Note  Patient: Gregory Tanner  Procedure(s) Performed: TRANSESOPHAGEAL ECHOCARDIOGRAM (TEE) BUBBLE STUDY     Patient location during evaluation: PACU Anesthesia Type: MAC Level of consciousness: awake and alert Pain management: pain level controlled Vital Signs Assessment: post-procedure vital signs reviewed and stable Respiratory status: spontaneous breathing, nonlabored ventilation and respiratory function stable Cardiovascular status: blood pressure returned to baseline and stable Postop Assessment: no apparent nausea or vomiting Anesthetic complications: no   No notable events documented.  Last Vitals:  Vitals:   12/14/20 1237 12/14/20 1256  BP: (!) 109/58 121/63  Pulse: 83 91  Resp: 20 18  Temp: 36.6 C (!) 36.2 C  SpO2: 95% 96%    Last Pain:  Vitals:   12/14/20 1256  TempSrc: Oral  PainSc:                  Lidia Collum

## 2020-12-14 NOTE — H&P (View-Only) (Signed)
Patient ID: Gregory Tanner, male   DOB: 22-Nov-1955, 65 y.o.   MRN: [id number]     Advanced Heart Failure Rounding Note  PCP-Cardiologist: Shirlee More, MD   Subjective:    SCr 2.05>>2.40>>2.16>>2.22>>2.04>>1.89. BUN 130>>138>>133.   Na 131. LFTs normal.   Currently on Lasix gtt 10 mg/hr, I/Os net negative 2030, weight down 2 lbs.    Cough gradually improving.    RHC Procedural Findings: Hemodynamics (mmHg) RA mean 15 with prominent v-waves to 21 RV 65/12 PA 66/25, mean 43 PCWP mean 17 Oxygen saturations: SVC 67% RA 84% RV 83% PA 85% PVs 94% AO 95% Cardiac Output (Fick) 19  Cardiac Index (Fick) 9.58 PVR 1.4 WU  PAPI 2.7  Qp/Qs 3.1  Objective:   Weight Range: 83.3 kg Body mass index is 26.36 kg/m.   Vital Signs:   Temp:  [97.8 F (36.6 C)-98.1 F (36.7 C)] 97.8 F (36.6 C) (10/31 0304) Pulse Rate:  [87-88] 88 (10/31 0304) Resp:  [16] 16 (10/31 0304) BP: (110-135)/(51-115) 110/51 (10/31 0304) SpO2:  [97 %-98 %] 97 % (10/31 0304) Weight:  [83.3 kg] 83.3 kg (10/31 0304) Last BM Date: 12/13/20  Weight change: Filed Weights   12/12/20 0342 12/13/20 0410 12/14/20 0304  Weight: 83.8 kg 84.1 kg 83.3 kg    Intake/Output:   Intake/Output Summary (Last 24 hours) at 12/14/2020 0845 Last data filed at 12/14/2020 0747 Gross per 24 hour  Intake 1479.77 ml  Output 4050 ml  Net -2570.23 ml      Physical Exam    General: NAD Neck: JVP 10 cm, no thyromegaly or thyroid nodule.  Lungs: Clear to auscultation bilaterally with normal respiratory effort. CV: Nondisplaced PMI.  Heart regular S1/S2, no S3/S4, 2/6 HSM LLSB.  2+ edema to knees.  Abdomen: Soft, nontender, no hepatosplenomegaly, no distention.  Skin: Intact without lesions or rashes.  Neurologic: Alert and oriented x 3.  Psych: Normal affect. Extremities: No clubbing or cyanosis.  HEENT: Normal.    Telemetry   NSR 80s-90s (personally reviewed)  Labs    CBC Recent Labs    12/13/20 0122  12/14/20 0321  WBC 5.8 5.2  HGB 10.8* 10.9*  HCT 32.5* 33.8*  MCV 90.8 91.4  PLT 120* 353*   Basic Metabolic Panel Recent Labs    12/13/20 0122 12/14/20 0321  NA 128*  129* 131*  K 3.8 3.2*  CL 89* 90*  CO2 25 29  GLUCOSE 133* 125*  BUN 138* 133*  CREATININE 2.04* 1.89*  CALCIUM 9.7 9.8   Liver Function Tests Recent Labs    12/13/20 0122 12/14/20 0321  AST 27 24  ALT 21 22  ALKPHOS 68 57  BILITOT 1.0 1.0  PROT 7.1 6.9  ALBUMIN 4.7 4.6   No results for input(s): LIPASE, AMYLASE in the last 72 hours. Cardiac Enzymes No results for input(s): CKTOTAL, CKMB, CKMBINDEX, TROPONINI in the last 72 hours.  BNP: BNP (last 3 results) Recent Labs    11/22/20 0622 11/29/20 1015 12/05/20 0510  BNP 352.1* 222.9* 237.7*    ProBNP (last 3 results) No results for input(s): PROBNP in the last 8760 hours.   D-Dimer No results for input(s): DDIMER in the last 72 hours. Hemoglobin A1C No results for input(s): HGBA1C in the last 72 hours. Fasting Lipid Panel No results for input(s): CHOL, HDL, LDLCALC, TRIG, CHOLHDL, LDLDIRECT in the last 72 hours. Thyroid Function Tests No results for input(s): TSH, T4TOTAL, T3FREE, THYROIDAB in the last 72 hours.  Invalid input(s): FREET3  Other results:   Imaging    No results found.   Medications:     Scheduled Medications:  aspirin EC  81 mg Oral Daily   atorvastatin  20 mg Oral Daily   benzonatate  200 mg Oral TID   chlorpheniramine-HYDROcodone  5 mL Oral Q12H   docusate sodium  100 mg Oral BID   enoxaparin (LOVENOX) injection  40 mg Subcutaneous Q24H   hydrALAZINE  25 mg Oral q morning   And   hydrALAZINE  12.5 mg Oral QHS   insulin aspart  0-9 Units Subcutaneous TID WC   mycophenolate  1,000 mg Oral BID   potassium chloride  40 mEq Oral BID   predniSONE  40 mg Oral Q breakfast   sodium chloride flush  3 mL Intravenous Q12H   sodium chloride flush  3 mL Intravenous Q12H    Infusions:  sodium chloride      sodium chloride 10 mL/hr at 12/13/20 1200   furosemide (LASIX) 200 mg in dextrose 5% 100 mL (2mg /mL) infusion 10 mg/hr (12/14/20 0013)    PRN Medications: sodium chloride, acetaminophen **OR** acetaminophen, alum & mag hydroxide-simeth, dextromethorphan-guaiFENesin, ipratropium-albuterol, lip balm, menthol-cetylpyridinium, methocarbamol, ondansetron (ZOFRAN) IV, phenol, polyethylene glycol    Patient Profile   65 y/o male w/ h/o NAFLD and hepatocellular carcinoma with liver transplant.  Now with RV failure in the setting of high output HF, has had extensive workup at [facility] with no cardiac or peripheral shunt discovered.  CKD stage III with creatinine 2.5 at baseline.    He had a recent admission here with CHF, not seen by cardiology.  Readmitted < 1 week post-discharge with wheezing and dyspnea, found to have recurrent CHF as well as RSV infection.   Assessment/Plan   1. Acute on chronic diastolic CHF with prominent RV failure: High output HF by last RHC in 12/21 at [facility].  Cardiac MRI in 12/21 at [facility] showed EF 74%, D-shaped septum, enlarged RV with RV EF 37%, Qp/Qs 1 with no evidence for significant shunt lesion, no pulmonary vein anomalies.  Echo this admission with normal EF 65-70%, moderate RV enlargement/mildly decreased RV systolic function, moderate TR.  MRI abdomen in 2/22 showed no evidence for AV shunt or fistula.   He has had extensive workup at [facility] with no definite intra- or extra-cardiac shunt lesion found to explain high output. He has struggled with RV failure, admitted markedly volume overloaded but this was complicated by cardiorenal syndrome with markedly high BUN.  RHC yesterday with mildly elevated PCWP, more significantly elevated RA pressure, primarily RV failure, PAPi ok at 2.7, pulmonary venous hypertension in setting of high cardiac output. Also noted significant step up between SVC and RA, suggesting ASD with Qp/Qs 3.1. Today, still right-sided HF on exam with gradual  improvement. Renal indices stable.  - C/w unna boots (ABIs not markedly abnormal).  - Continues on Lasix gtt 10 mg/hr today, possibly to po tomorrow.    - US abdomen with no drainable ascites - SGLT2 inhibitor stopped due to perineal yeast infection.  - TEE today to assess for ASD.  Discussed risks/benefits with patient and he agrees to procedure.  2. CKD stage 3: Stable renal indices but still very high BUN. Suspect cardiorenal syndrome.  - BP stable, does not appear to need midodrine.  3. Hyponatremia:  Hypervolemic hyponatremia. Na up to 131.  - Fluid restrict. 4. H/o liver transplant: Followed at [facility]. On mycophenolate.  Off tacrolimus due to side effects.  5. PAD: Moderate  on left. Will need statin that does not interact with immunosuppression.  6. RSV: continues w/ persistent cough, modest improvement - continue cough regimen, now on prednisone.   Length of Stay: 7  Loralie Champagne, MD  12/14/2020, 8:45 AM  Advanced Heart Failure Team Pager [phone] (M-F; 7a - 5p)  Please contact [facility] for night-coverage after hours (5p -7a ) and weekends on [email or web address]

## 2020-12-14 NOTE — CV Procedure (Signed)
Procedure: TEE  Sedation: Per anesthesiology.   Indication: ?ASD  Findings: Please see echo section for full report.  Normal LV size with normal wall thickness.  EF 60-65%, no wall motion abnormalities.  Moderate RV dilation with mildly decreased RV systolic function.  D-shaped septum suggestive of RV pressure/volume overload.  Moderate biatrial enlargement.  No PFO or ASD noted, negative bubble study.  Cannot rule out anomalous right upper pulmonary vein connecting to SVC (concerning for this in several images but not definitive).  Moderate TR, peak RV-RA gradient 38 mmHg.  Trivial PI.  Trivial MR.  Trileaflet aortic valve with trivial AI, no aortic stenosis.  Normal caliber thoracic aorta with minimal plaque.   No ASD.  Cannot rule out anomalous RUPV to SVC.   Gregory Tanner 12/14/2020 12:21 PM

## 2020-12-14 NOTE — Anesthesia Procedure Notes (Signed)
Procedure Name: MAC Date/Time: 12/14/2020 11:44 AM Performed by: Dorthea Cove, CRNA Pre-anesthesia Checklist: Patient identified, Emergency Drugs available, Suction available, Patient being monitored and Timeout performed Patient Re-evaluated:Patient Re-evaluated prior to induction Oxygen Delivery Method: Nasal cannula Preoxygenation: Pre-oxygenation with 100% oxygen Induction Type: IV induction Placement Confirmation: positive ETCO2 and CO2 detector Dental Injury: Teeth and Oropharynx as per pre-operative assessment

## 2020-12-14 NOTE — Progress Notes (Signed)
Mobility Specialist Progress Note:   12/14/20 1545  Mobility  Activity Ambulated in room  Level of Assistance Standby assist, set-up cues, supervision of patient - no hands on  Assistive Device None  Distance Ambulated (ft) 80 ft  Mobility Ambulated with assistance in room  Mobility Response Tolerated well  Mobility performed by Mobility specialist  Bed Position Chair  $Mobility charge 1 Mobility   Pt received in chair willing to participate in mobility. Pt "didn't want to over-do it" so we  stayed in the room. Pt returned to chair with call bell in reach, all needs met, and wife present.   [facility] and safety inspector Phone [phone]

## 2020-12-14 NOTE — Anesthesia Preprocedure Evaluation (Signed)
Anesthesia Evaluation  Patient identified by MRN, date of birth, ID band Patient awake    Reviewed: Allergy & Precautions, NPO status , Patient's Chart, lab work & pertinent test results  History of Anesthesia Complications Negative for: history of anesthetic complications  Airway Mallampati: II  TM Distance: >3 FB Neck ROM: Full    Dental   Pulmonary Recent URI  (RSV),    Pulmonary exam normal        Cardiovascular hypertension, + Peripheral Vascular Disease and +CHF  Normal cardiovascular exam+ Valvular Problems/Murmurs    Echo 10/10 with normal EF 65-70%, moderate RV enlargement/mildly decreased RV systolic function, moderate TR.  [place] 10/28 with mildly elevated PCWP, more significantly elevated RA pressure, primarily RV failure, PAPi ok at 2.7, pulmonary venous hypertension in setting of high cardiac output. Also noted significant step up between SVC and RA, suggesting ASD with Qp/Qs 3.1   Neuro/Psych negative neurological ROS     GI/Hepatic negative GI ROS, NASH/hepatocellular carcinoma s/p liver txp (2013)   Endo/Other  diabetes, Type 2, Insulin Dependent  Renal/GU CRFRenal disease  negative genitourinary   Musculoskeletal negative musculoskeletal ROS (+)   Abdominal   Peds  Hematology  (+) anemia ,   Anesthesia Other Findings  TEE to eval for possible ASD  Reproductive/Obstetrics                            Anesthesia Physical Anesthesia Plan  ASA: 4  Anesthesia Plan: MAC   Post-op Pain Management:    Induction: Intravenous  PONV Risk Score and Plan: Propofol infusion, TIVA and Treatment may vary due to age or medical condition  Airway Management Planned: Natural Airway, Nasal Cannula and Simple Face Mask  Additional Equipment: None  Intra-op Plan:   Post-operative Plan:   Informed Consent: I have reviewed the patients History and Physical, chart, labs and discussed the  procedure including the risks, benefits and alternatives for the proposed anesthesia with the patient or authorized representative who has indicated his/her understanding and acceptance.       Plan Discussed with:   Anesthesia Plan Comments:         Anesthesia Quick Evaluation

## 2020-12-14 NOTE — Transfer of Care (Signed)
Immediate Anesthesia Transfer of Care Note  Patient: Gregory Tanner  Procedure(s) Performed: TRANSESOPHAGEAL ECHOCARDIOGRAM (TEE) BUBBLE STUDY  Patient Location: PACU  Anesthesia Type:MAC  Level of Consciousness: drowsy  Airway & Oxygen Therapy: Patient Spontanous Breathing and Patient connected to nasal cannula oxygen  Post-op Assessment: Report given to RN and Post -op Vital signs reviewed and stable  Post vital signs: Reviewed and stable  Last Vitals:  Vitals Value Taken Time  BP 98/50 12/14/20 1215  Temp 36.6 C 12/14/20 1215  Pulse 79 12/14/20 1218  Resp 22 12/14/20 1218  SpO2 95 % 12/14/20 1218  Vitals shown include unvalidated device data.  Last Pain:  Vitals:   12/14/20 1046  TempSrc: Temporal  PainSc: 0-No pain      Patients Stated Pain Goal: 0 (07/46/00 2984)  Complications: No notable events documented.

## 2020-12-14 NOTE — Interval H&P Note (Signed)
History and Physical Interval Note:  12/14/2020 11:36 AM  Gregory Tanner  has presented today for surgery, with the diagnosis of chf.  The various methods of treatment have been discussed with the patient and family. After consideration of risks, benefits and other options for treatment, the patient has consented to  Procedure(s): TRANSESOPHAGEAL ECHOCARDIOGRAM (TEE) (N/A) as a surgical intervention.  The patient's history has been reviewed, patient examined, no change in status, stable for surgery.  I have reviewed the patient's chart and labs.  Questions were answered to the patient's satisfaction.     Tyson Masin [employer]

## 2020-12-14 NOTE — Progress Notes (Signed)
  Echocardiogram 2D Echocardiogram has been performed.  Gregory Tanner 12/14/2020, 12:20 PM

## 2020-12-14 NOTE — Progress Notes (Signed)
Patient ID: Gregory Tanner, male   DOB: Jun 19, 1955, 65 y.o.   MRN: [id number]     Advanced Heart Failure Rounding Note  PCP-Cardiologist: Shirlee More, MD   Subjective:    SCr 2.05>>2.40>>2.16>>2.22>>2.04>>1.89. BUN 130>>138>>133.   Na 131. LFTs normal.   Currently on Lasix gtt 10 mg/hr, I/Os net negative 2030, weight down 2 lbs.    Cough gradually improving.    RHC Procedural Findings: Hemodynamics (mmHg) RA mean 15 with prominent v-waves to 21 RV 65/12 PA 66/25, mean 43 PCWP mean 17 Oxygen saturations: SVC 67% RA 84% RV 83% PA 85% PVs 94% AO 95% Cardiac Output (Fick) 19  Cardiac Index (Fick) 9.58 PVR 1.4 WU  PAPI 2.7  Qp/Qs 3.1  Objective:   Weight Range: 83.3 kg Body mass index is 26.36 kg/m.   Vital Signs:   Temp:  [97.8 F (36.6 C)-98.1 F (36.7 C)] 97.8 F (36.6 C) (10/31 0304) Pulse Rate:  [87-88] 88 (10/31 0304) Resp:  [16] 16 (10/31 0304) BP: (110-135)/(51-115) 110/51 (10/31 0304) SpO2:  [97 %-98 %] 97 % (10/31 0304) Weight:  [83.3 kg] 83.3 kg (10/31 0304) Last BM Date: 12/13/20  Weight change: Filed Weights   12/12/20 0342 12/13/20 0410 12/14/20 0304  Weight: 83.8 kg 84.1 kg 83.3 kg    Intake/Output:   Intake/Output Summary (Last 24 hours) at 12/14/2020 0845 Last data filed at 12/14/2020 0747 Gross per 24 hour  Intake 1479.77 ml  Output 4050 ml  Net -2570.23 ml      Physical Exam    General: NAD Neck: JVP 10 cm, no thyromegaly or thyroid nodule.  Lungs: Clear to auscultation bilaterally with normal respiratory effort. CV: Nondisplaced PMI.  Heart regular S1/S2, no S3/S4, 2/6 HSM LLSB.  2+ edema to knees.  Abdomen: Soft, nontender, no hepatosplenomegaly, no distention.  Skin: Intact without lesions or rashes.  Neurologic: Alert and oriented x 3.  Psych: Normal affect. Extremities: No clubbing or cyanosis.  HEENT: Normal.    Telemetry   NSR 80s-90s (personally reviewed)  Labs    CBC Recent Labs    12/13/20 0122  12/14/20 0321  WBC 5.8 5.2  HGB 10.8* 10.9*  HCT 32.5* 33.8*  MCV 90.8 91.4  PLT 120* 258*   Basic Metabolic Panel Recent Labs    12/13/20 0122 12/14/20 0321  NA 128*  129* 131*  K 3.8 3.2*  CL 89* 90*  CO2 25 29  GLUCOSE 133* 125*  BUN 138* 133*  CREATININE 2.04* 1.89*  CALCIUM 9.7 9.8   Liver Function Tests Recent Labs    12/13/20 0122 12/14/20 0321  AST 27 24  ALT 21 22  ALKPHOS 68 57  BILITOT 1.0 1.0  PROT 7.1 6.9  ALBUMIN 4.7 4.6   No results for input(s): LIPASE, AMYLASE in the last 72 hours. Cardiac Enzymes No results for input(s): CKTOTAL, CKMB, CKMBINDEX, TROPONINI in the last 72 hours.  BNP: BNP (last 3 results) Recent Labs    11/22/20 0622 11/29/20 1015 12/05/20 0510  BNP 352.1* 222.9* 237.7*    ProBNP (last 3 results) No results for input(s): PROBNP in the last 8760 hours.   D-Dimer No results for input(s): DDIMER in the last 72 hours. Hemoglobin A1C No results for input(s): HGBA1C in the last 72 hours. Fasting Lipid Panel No results for input(s): CHOL, HDL, LDLCALC, TRIG, CHOLHDL, LDLDIRECT in the last 72 hours. Thyroid Function Tests No results for input(s): TSH, T4TOTAL, T3FREE, THYROIDAB in the last 72 hours.  Invalid input(s): FREET3  Other results:   Imaging    No results found.   Medications:     Scheduled Medications:  aspirin EC  81 mg Oral Daily   atorvastatin  20 mg Oral Daily   benzonatate  200 mg Oral TID   chlorpheniramine-HYDROcodone  5 mL Oral Q12H   docusate sodium  100 mg Oral BID   enoxaparin (LOVENOX) injection  40 mg Subcutaneous Q24H   hydrALAZINE  25 mg Oral q morning   And   hydrALAZINE  12.5 mg Oral QHS   insulin aspart  0-9 Units Subcutaneous TID WC   mycophenolate  1,000 mg Oral BID   potassium chloride  40 mEq Oral BID   predniSONE  40 mg Oral Q breakfast   sodium chloride flush  3 mL Intravenous Q12H   sodium chloride flush  3 mL Intravenous Q12H    Infusions:  sodium chloride      sodium chloride 10 mL/hr at 12/13/20 1200   furosemide (LASIX) 200 mg in dextrose 5% 100 mL (2mg /mL) infusion 10 mg/hr (12/14/20 0013)    PRN Medications: sodium chloride, acetaminophen **OR** acetaminophen, alum & mag hydroxide-simeth, dextromethorphan-guaiFENesin, ipratropium-albuterol, lip balm, menthol-cetylpyridinium, methocarbamol, ondansetron (ZOFRAN) IV, phenol, polyethylene glycol    Patient Profile   65 y/o male w/ h/o NAFLD and hepatocellular carcinoma with liver transplant.  Now with RV failure in the setting of high output HF, has had extensive workup at [facility] with no cardiac or peripheral shunt discovered.  CKD stage III with creatinine 2.5 at baseline.    He had a recent admission here with CHF, not seen by cardiology.  Readmitted < 1 week post-discharge with wheezing and dyspnea, found to have recurrent CHF as well as RSV infection.   Assessment/Plan   1. Acute on chronic diastolic CHF with prominent RV failure: High output HF by last RHC in 12/21 at [facility].  Cardiac MRI in 12/21 at [facility] showed EF 74%, D-shaped septum, enlarged RV with RV EF 37%, Qp/Qs 1 with no evidence for significant shunt lesion, no pulmonary vein anomalies.  Echo this admission with normal EF 65-70%, moderate RV enlargement/mildly decreased RV systolic function, moderate TR.  MRI abdomen in 2/22 showed no evidence for AV shunt or fistula.   He has had extensive workup at [facility] with no definite intra- or extra-cardiac shunt lesion found to explain high output. He has struggled with RV failure, admitted markedly volume overloaded but this was complicated by cardiorenal syndrome with markedly high BUN.  RHC yesterday with mildly elevated PCWP, more significantly elevated RA pressure, primarily RV failure, PAPi ok at 2.7, pulmonary venous hypertension in setting of high cardiac output. Also noted significant step up between SVC and RA, suggesting ASD with Qp/Qs 3.1. Today, still right-sided HF on exam with gradual  improvement. Renal indices stable.  - C/w unna boots (ABIs not markedly abnormal).  - Continues on Lasix gtt 10 mg/hr today, possibly to po tomorrow.    - US abdomen with no drainable ascites - SGLT2 inhibitor stopped due to perineal yeast infection.  - TEE today to assess for ASD.  Discussed risks/benefits with patient and he agrees to procedure.  2. CKD stage 3: Stable renal indices but still very high BUN. Suspect cardiorenal syndrome.  - BP stable, does not appear to need midodrine.  3. Hyponatremia:  Hypervolemic hyponatremia. Na up to 131.  - Fluid restrict. 4. H/o liver transplant: Followed at [facility]. On mycophenolate.  Off tacrolimus due to side effects.  5. PAD: Moderate  on left. Will need statin that does not interact with immunosuppression.  6. RSV: continues w/ persistent cough, modest improvement - continue cough regimen, now on prednisone.   Length of Stay: 7  Loralie Champagne, MD  12/14/2020, 8:45 AM  Advanced Heart Failure Team Pager [phone] (M-F; 7a - 5p)  Please contact [facility] for night-coverage after hours (5p -7a ) and weekends on [email or web address]

## 2020-12-14 NOTE — Progress Notes (Addendum)
Subjective: No acute overnight events.  Mentions improvement in cough compared to yesterday. Has been NPO since midnight, expecting TEE this afternoon. He feels "better" than when he was admitted. He continues to state that hot tea has been helpful in his progression. He remains in good spirits.  Updated on completing steroid course and switching to PO lasix.   Objective:  Vital signs in last 24 hours: Vitals:   12/13/20 1000 12/13/20 2101 12/13/20 2116 12/14/20 0304  BP: (!) 128/51 (!) 135/115 (!) 133/57 (!) 110/51  Pulse:  87 87 88  Resp:  16  16  Temp:  98.1 F (36.7 C)  97.8 F (36.6 C)  TempSrc:  Oral  Oral  SpO2: 98% 98%  97%  Weight:    83.3 kg  Height:       I: 1.7 L O: 3.75 L Net - 13.8 L since admit 83.3 kg from 84.1 kg yesterday  Physical exam: Constitutional: Ill-appearing, mild coughing during exam, NAD on RA HENT: Normocephalic and atraumatic, EOMI, conjunctiva normal Cardiovascular: Normal rate, regular rhythm, S1 and S2 present, no murmurs, rubs, gallops.  Respiratory: Normal work of breathing. No accessory muscle usage. CTAB. GI: Moderately distended, nontender to palpation, normal active bowel sounds Musculoskeletal: Normal bulk and tone. Unna boots intact. 2+ bilateral pitting edema bilaterally. Neurological: Is alert and oriented x4, no apparent focal deficits noted. Skin: Warm and dry. Psychiatric: Normal mood and affect. Behavior is normal. Judgment and thought content normal.   Assessment/Plan:  Active Problems:   Acute on chronic diastolic CHF (congestive heart failure) (HCC)   Acute exacerbation of CHF (congestive heart failure) (HCC)   Acute respiratory distress   # Acute Hypoxic Respiratory Failure # Acute Respiratory Distress # RSV  Increased shortness of breath onset about 2.5 weeks ago. Patient notes that his cough has improved during his hospitalization. Satting well on RA. Respiratory viral panel found to be positive for RSV. He  remains afebrile with no leukocytosis. Will continue supportive management, as below, at this time.   - Continue tessalon Perles 200 mg 3 times daily -- Tussionex 5 mL p.o. twice daily for cough -- Duonebs q6h PRN -- S/p 5 days of Prednisone 40 mg PO daily - Supplemental O2 as needed to maintain saturations >92%  # Right-sided Heart Failure # HFpEF Patient with a complicated heart failure picture for which he has had extensive work-up at [facility]. During his last admission here, he diuresed over 20 L of fluid and returned within 48 hours with worsening shortness of breath. Please see H&P for more details.    The patient continues to appear hypervolemic on exam though the patient notes that his volume status is "the best it is going to get." Na 131. Today, output of 1.7 L, Net -13.8 L since admit. Weight has been stable around 83.3 kg, which is lower than his dry weight of ~86 kg. Cr currently at 1.89, BUN continues to be elevated. Given his complex picture, we have consulted the heart failure team for their recommendations.    Patient is status post catheterization on 10/28. Results showed mildly elevated PCWP, predominantly right-sided heart failure, possible ASD. Given findings, will continue to carefully diurese and follow upTEE today, 10/31. Otherwise, plan as below:   -- Cardiology on board, appreciate their recommendations -- Plan for TEE today, 10/31 to assess for ASD -- Unna boots, bilateral -- Renal diet with 1200 mL fluid restriction -- Lasix gtt @ 10 mg/hr, possible transition to po today --  Monitor I/O's, weights -- Flutter valve  # CKD Stage 3b Creatinine at 1.89 today. Baseline between 2.2 - 2.5.  - Monitor renal function - Strict I/Os  # Cryptogenic Cirrhosis c/b HCC s/p transplant (2013) - Continue mycophenolate 1,000 mg twice daily  # Insulin-Dependent Type 2 Diabetes Mellitus A1c 5.2, CBGs well controlled during this admission. - SSI  # Pulmonary Hypertension   Etiology uncertain. MRI in February with portal hypertension with splenomegaly, ascites and enlarged venous vasculature suggestive of portopulmonary hypertension.   - Will need outpatient follow up with pulmonary hypertension clinic at [facility] (previously referred, but patient has not arranged an appointment).   #Thoracic Aortic aneurysm 4.0 cm aneurysm of the ascending aorta. Incidental finding on CT chest on 10/22.  -Will need annual CTA/MRA for surveillance   #PAD Right ABI 1.04, Left ABI 0.73. Started on a moderate-intensity statin that does not interact with mycophenolate. -Continue atorvastatin 20 mg po daily  Diet: Renal with 1200 mL fluid restriction VTE: Enoxaparin IVF: None Code: Full   Prior to Admission Living Arrangement: Home, living with his wife Anticipated Discharge Location:  Home Barriers to Discharge: Continued medical stabilization, TEE on 10/31 Dispo: Anticipated discharge pending medical stabilization, TEE on 10/31  Signature: Mitzie Na, MD  Internal Medicine Resident, PGY-1 [facility]  Pager: [phone] 7:48 AM, 12/14/2020  After 5pm on weekdays and 1pm on weekends: On Call pager [phone]

## 2020-12-15 ENCOUNTER — Inpatient Hospital Stay (HOSPITAL_COMMUNITY): Payer: Medicare Other

## 2020-12-15 DIAGNOSIS — R188 Other ascites: Secondary | ICD-10-CM | POA: Diagnosis not present

## 2020-12-15 DIAGNOSIS — I5033 Acute on chronic diastolic (congestive) heart failure: Secondary | ICD-10-CM | POA: Diagnosis not present

## 2020-12-15 DIAGNOSIS — I509 Heart failure, unspecified: Secondary | ICD-10-CM | POA: Diagnosis not present

## 2020-12-15 LAB — GLUCOSE, CAPILLARY
Glucose-Capillary: 102 mg/dL — ABNORMAL HIGH (ref 70–99)
Glucose-Capillary: 143 mg/dL — ABNORMAL HIGH (ref 70–99)
Glucose-Capillary: 116 mg/dL — ABNORMAL HIGH (ref 70–99)
Glucose-Capillary: 170 mg/dL — ABNORMAL HIGH (ref 70–99)

## 2020-12-15 LAB — COMPREHENSIVE METABOLIC PANEL
ALT: 25 U/L (ref 0–44)
AST: 26 U/L (ref 15–41)
Albumin: 4.5 g/dL (ref 3.5–5.0)
Alkaline Phosphatase: 55 U/L (ref 38–126)
Anion gap: 11 (ref 5–15)
BUN: 124 mg/dL — ABNORMAL HIGH (ref 8–23)
CO2: 28 mmol/L (ref 22–32)
Calcium: 9.6 mg/dL (ref 8.9–10.3)
Chloride: 89 mmol/L — ABNORMAL LOW (ref 98–111)
Creatinine, Ser: 1.83 mg/dL — ABNORMAL HIGH (ref 0.61–1.24)
GFR, Estimated: 40 mL/min — ABNORMAL LOW (ref >60)
Glucose, Bld: 105 mg/dL — ABNORMAL HIGH (ref 70–99)
Potassium: 3.7 mmol/L (ref 3.5–5.1)
Sodium: 128 mmol/L — ABNORMAL LOW (ref 135–145)
Total Bilirubin: 1.5 mg/dL — ABNORMAL HIGH (ref 0.3–1.2)
Total Protein: 6.6 g/dL (ref 6.5–8.1)

## 2020-12-15 LAB — CBC
HCT: 33.7 % — ABNORMAL LOW (ref 39.0–52.0)
Hemoglobin: 11.1 g/dL — ABNORMAL LOW (ref 13.0–17.0)
MCH: 29.8 pg (ref 26.0–34.0)
MCHC: 32.9 g/dL (ref 30.0–36.0)
MCV: 90.6 fL (ref 80.0–100.0)
Platelets: 116 10*3/uL — ABNORMAL LOW (ref 150–400)
RBC: 3.72 MIL/uL — ABNORMAL LOW (ref 4.22–5.81)
RDW: 16.4 % — ABNORMAL HIGH (ref 11.5–15.5)
WBC: 6.2 10*3/uL (ref 4.0–10.5)
nRBC: 0 % (ref 0.0–0.2)

## 2020-12-15 MED ORDER — GADOBUTROL 1 MMOL/ML IV SOLN
10.0000 mL | Freq: Once | INTRAVENOUS | Status: AC | PRN
  Administered 2020-12-15: 10 mL via INTRAVENOUS

## 2020-12-15 MED ORDER — LORAZEPAM 2 MG/ML IJ SOLN
INTRAMUSCULAR | Status: AC
  Administered 2020-12-15: 0.5 mg
  Filled 2020-12-15: qty 1

## 2020-12-15 MED ORDER — TORSEMIDE 20 MG PO TABS
60.0000 mg | ORAL_TABLET | Freq: Two times a day (BID) | ORAL | Status: DC
  Administered 2020-12-15 – 2020-12-16 (×2): 60 mg via ORAL
  Filled 2020-12-15 (×2): qty 3

## 2020-12-15 NOTE — Progress Notes (Signed)
HD#8 SUBJECTIVE:  Patient Summary: Gregory Tanner is a 65 y.o. with a pertinent PMH of chronic diastolic heart failure, pulmonary hypertension, cryptogenic cirrhosis complicated by hepatocellular carcinoma s/p liver transplant, CKD IIIb, type 2 diabetes, and hypertension who presented with worsening shortness of breath and admitted for acute on chronic diastolic heart failure.   Overnight Events: No acute events overnight  Interim History: This is hospital day 8 for Gregory Tanner who was seen and evaluated sitting up in his bedside chair this morning. He reports that his cough is improved. He has no shortness of breath at rest and generally feels improved compared to when he presented to the ED. He remains off of supplemental O2. Patient still awaiting MR angio of chest, but is in good spirits.   OBJECTIVE:  Vital Signs: Vitals:   12/14/20 1256 12/14/20 2110 12/14/20 2354 12/15/20 0538  BP: 121/63 129/67 (!) 126/50 (!) 120/54  Pulse: 91 89 89 87  Resp: 18 18 18 20   Temp: (!) 97.2 F (36.2 C) 98 F (36.7 C) 98.2 F (36.8 C)   TempSrc: Oral Oral Oral   SpO2: 96% 100% 96% 97%  Weight:      Height:       Supplemental O2: Room Air SpO2: 97%  Filed Weights   12/13/20 0410 12/14/20 0304 12/14/20 1046  Weight: 84.1 kg 83.3 kg 83.3 kg     Intake/Output Summary (Last 24 hours) at 12/15/2020 3419 Last data filed at 12/15/2020 0557 Gross per 24 hour  Intake 993.58 ml  Output 1775 ml  Net -781.42 ml   Net IO Since Admission: -13,808.9 mL [12/15/20 0614]  Physical Exam: General: ill-appearing male laying in bed. No acute distress. Head: Normocephalic. Atraumatic. CV: RRR. 2/6 systolic murmur. +JVD Pulmonary: Lungs CTAB. Normal effort. No wheezing or rales. Abdominal: Soft, nontender, moderately distended. Normal bowel sounds. Extremities: Normal bulk and tone. Unna boots intact. 1-2+ bilateral pitting edema  Skin: Warm and dry. No obvious rash or lesions. Neuro: A&Ox3. Moves  all extremities. Normal sensation. No focal deficit. Psych: Normal mood and affect    ASSESSMENT/PLAN:  Assessment: Active Problems:   Acute on chronic diastolic CHF (congestive heart failure) (HCC)   Acute exacerbation of CHF (congestive heart failure) (HCC)   Acute respiratory distress   Plan: #Right sided heart failure  #HFpEF Patient has been noted to have high output HF by last [place] in 2021 at [facility]. He is s/p cath on 10/28 which showed mildly elevated PCWP, predominantly right sided heart failure. TEE obtained on 10/31 and showed EF of 60-65% with no WMA, no evidence of PFO of ASD, however, cannot rule out anomalous right upper pulmonary vein connecting to SVC. MR angio of chest to be performed to assess this further. The patient has been aggressively diuresed during this admission, with a net output of 13.8 L. He is down ~ 6 kg since admission, as well.  - Heart failure team consulted, appreciate recommendations  - MR angio of chest today to assess for anomalous RUPV to SVC - Lasix 10 mg/hr gtt discontinued today - Start torsemide 60 mg po bid - Strict I&Os and daily weights - Bilateral unna boots  #Acute hypoxic respiratory failure #RSV Patient presented with shortness of breath and a cough that started about 2.5 weeks ago. Resp panel found to be positive for RSV. Patient remains afebrile with no leukocytosis and overall, is clinically improving. He is s/p five days of prednisone 40 mg daily.  - Continue Tessalon Perles  200 mg tid  - Tussionex 5 mL bid for cough - Duonebs q6h prn  - Continue supplemental O2 as needed to maintain SpO2 >92%   #CKD IIIb Baseline creatinine noted to be between 2-2.5. Cr 1.83 today - Continue to monitor renal function  #Hyperlipidemia #PAD On lipitor 20 mg and aspirin 81 mg   #Type 2 diabetes A1c 5.2. CBGs well controlled during this admission. - SSI  Best Practice: Diet: Renal diet with fluid restriction IVF: Fluids: None VTE:  enoxaparin (LOVENOX) injection 40 mg Start: 12/11/20 1000 Code: Full Therapy Recs: None DISPO: Anticipated discharge  1 day  to Home pending  clinical improvement .  Signature: Buddy Duty, D.O.  Internal Medicine Resident, PGY-1 [facility]  Pager: [phone] 6:14 AM, 12/15/2020   Please contact the on call pager after 5 pm and on weekends at [phone].

## 2020-12-15 NOTE — Progress Notes (Addendum)
Patient ID: Gregory Tanner, male   DOB: 09-19-1955, 65 y.o.   MRN: [id number]     Advanced Heart Failure Rounding Note  PCP-Cardiologist: Shirlee More, MD   Subjective:    TEE 10/31: EF 60-65%, RV mildly reduced w/ D-shaped septum.  No PFO or ASD noted, negative bubble study. Cannot rule out anomalous RUPV to SVC.   On Lasix gtt at 10/hr. ? If I/Os accurate, only 925 cc charted in UOP yesterday. Wt not charted yet for today (have asked nursing staff to do).   SCr stable, 1.89>>1.83. BUN down 138>>133>124  Na 128   Cough has improved. OOB sitting up in chair eating breakfast. Appetite is good.   No resting dyspnea.   RHC Procedural Findings: Hemodynamics (mmHg) RA mean 15 with prominent v-waves to 21 RV 65/12 PA 66/25, mean 43 PCWP mean 17 Oxygen saturations: SVC 67% RA 84% RV 83% PA 85% PVs 94% AO 95% Cardiac Output (Fick) 19  Cardiac Index (Fick) 9.58 PVR 1.4 WU  PAPI 2.7  Qp/Qs 3.1  Objective:   Weight Range: 83.3 kg Body mass index is 26.35 kg/m.   Vital Signs:   Temp:  [97.2 F (36.2 C)-98.2 F (36.8 C)] 98.2 F (36.8 C) (10/31 2354) Pulse Rate:  [78-91] 87 (11/01 0538) Resp:  [17-25] 20 (11/01 0538) BP: (98-129)/(50-67) 120/54 (11/01 0538) SpO2:  [92 %-100 %] 97 % (11/01 0538) Weight:  [83.3 kg] 83.3 kg (10/31 1046) Last BM Date: 12/13/20  Weight change: Filed Weights   12/13/20 0410 12/14/20 0304 12/14/20 1046  Weight: 84.1 kg 83.3 kg 83.3 kg    Intake/Output:   Intake/Output Summary (Last 24 hours) at 12/15/2020 0728 Last data filed at 12/15/2020 0557 Gross per 24 hour  Intake 993.58 ml  Output 925 ml  Net 68.58 ml      Physical Exam    General:  Well appearing, sitting up in chair. No respiratory difficulty HEENT: normal Neck: supple. JVD 10 cm. Carotids 2+ bilat; no bruits. No lymphadenopathy or thyromegaly appreciated. Cor: PMI nondisplaced. Regular rhythm, mildly tachy rate. 2/6 HSM LSB Lungs: decreased BS at the bases  bilaterally  Abdomen: soft, nontender, nondistended. No hepatosplenomegaly. No bruits or masses. Good bowel sounds. Extremities: no cyanosis, clubbing, rash, 1+ bilateral LEE up to knees, bilateral unna boots  Neuro: alert & oriented x 3, cranial nerves grossly intact. moves all 4 extremities w/o difficulty. Affect pleasant.   Telemetry   NSR 90s-100. (personally reviewed)  Labs    CBC Recent Labs    12/14/20 0321 12/15/20 0421  WBC 5.2 6.2  HGB 10.9* 11.1*  HCT 33.8* 33.7*  MCV 91.4 90.6  PLT 115* 657*   Basic Metabolic Panel Recent Labs    12/14/20 0321 12/15/20 0421  NA 131* 128*  K 3.2* 3.7  CL 90* 89*  CO2 29 28  GLUCOSE 125* 105*  BUN 133* 124*  CREATININE 1.89* 1.83*  CALCIUM 9.8 9.6   Liver Function Tests Recent Labs    12/14/20 0321 12/15/20 0421  AST 24 26  ALT 22 25  ALKPHOS 57 55  BILITOT 1.0 1.5*  PROT 6.9 6.6  ALBUMIN 4.6 4.5   No results for input(s): LIPASE, AMYLASE in the last 72 hours. Cardiac Enzymes No results for input(s): CKTOTAL, CKMB, CKMBINDEX, TROPONINI in the last 72 hours.  BNP: BNP (last 3 results) Recent Labs    11/22/20 0622 11/29/20 1015 12/05/20 0510  BNP 352.1* 222.9* 237.7*    ProBNP (last 3 results) No  results for input(s): PROBNP in the last 8760 hours.   D-Dimer No results for input(s): DDIMER in the last 72 hours. Hemoglobin A1C No results for input(s): HGBA1C in the last 72 hours. Fasting Lipid Panel No results for input(s): CHOL, HDL, LDLCALC, TRIG, CHOLHDL, LDLDIRECT in the last 72 hours. Thyroid Function Tests No results for input(s): TSH, T4TOTAL, T3FREE, THYROIDAB in the last 72 hours.  Invalid input(s): FREET3  Other results:   Imaging    No results found.   Medications:     Scheduled Medications:  aspirin EC  81 mg Oral Daily   atorvastatin  20 mg Oral Daily   benzonatate  200 mg Oral TID   chlorpheniramine-HYDROcodone  5 mL Oral Q12H   docusate sodium  100 mg Oral BID    enoxaparin (LOVENOX) injection  40 mg Subcutaneous Q24H   hydrALAZINE  25 mg Oral q morning   And   hydrALAZINE  12.5 mg Oral QHS   insulin aspart  0-9 Units Subcutaneous TID WC   LORazepam  0.5 mg Oral Once   mycophenolate  1,000 mg Oral BID   potassium chloride  40 mEq Oral BID   sodium chloride flush  3 mL Intravenous Q12H   sodium chloride flush  3 mL Intravenous Q12H    Infusions:  sodium chloride 10 mL/hr at 12/13/20 1200   furosemide (LASIX) 200 mg in dextrose 5% 100 mL (2mg /mL) infusion 10 mg/hr (12/14/20 2352)    PRN Medications: sodium chloride, acetaminophen **OR** acetaminophen, alum & mag hydroxide-simeth, dextromethorphan-guaiFENesin, ipratropium-albuterol, lip balm, menthol-cetylpyridinium, methocarbamol, ondansetron (ZOFRAN) IV, phenol, polyethylene glycol    Patient Profile   65 y/o male w/ h/o NAFLD and hepatocellular carcinoma with liver transplant.  Now with RV failure in the setting of high output HF, has had extensive workup at [facility] with no cardiac or peripheral shunt discovered.  CKD stage III with creatinine 2.5 at baseline.    He had a recent admission here with CHF, not seen by cardiology.  Readmitted < 1 week post-discharge with wheezing and dyspnea, found to have recurrent CHF as well as RSV infection.   Assessment/Plan   1. Acute on chronic diastolic CHF with prominent RV failure: High output HF by last RHC in 12/21 at [facility].  Cardiac MRI in 12/21 at [facility] showed EF 74%, D-shaped septum, enlarged RV with RV EF 37%, Qp/Qs 1 with no evidence for significant shunt lesion, no pulmonary vein anomalies.  Echo this admission with normal EF 65-70%, moderate RV enlargement/mildly decreased RV systolic function, moderate TR.  MRI abdomen in 2/22 showed no evidence for AV shunt or fistula.   He has had extensive workup at [facility] with no definite intra- or extra-cardiac shunt lesion found to explain high output. He has struggled with RV failure, admitted markedly volume  overloaded but this was complicated by cardiorenal syndrome with markedly high BUN.  RHC with mildly elevated PCWP, more significantly elevated RA pressure, primarily RV failure, PAPi ok at 2.7, pulmonary venous hypertension in setting of high cardiac output. Also noted significant step up between SVC and RA, suggesting ASD with Qp/Qs 3.1.  TEE showed no ASD.  Cannot rule out anomalous RUPV to SVC. Today, still right-sided HF on exam with gradual improvement. Renal indices stable.  - C/w unna boots (ABIs not markedly abnormal).  - Continue on Lasix gtt 10 mg/hr today. Monitor UOP. If sluggish, may need metolazone. Possibly to po tomorrow.    - US abdomen with no drainable ascites - SGLT2  inhibitor stopped due to perineal yeast infection.  - Plan MR Angio of chest today to assess for anomalous RUPV to SVC 2. CKD stage 3: Stable renal indices but still very high BUN but starting to trend down. Suspect cardiorenal syndrome.  - BP stable, does not appear to need midodrine.  3. Hyponatremia:  Hypervolemic hyponatremia. Na 128 today - Fluid restrict. 4. H/o liver transplant: Followed at [facility]. On mycophenolate.  Off tacrolimus due to side effects.  5. PAD: Moderate on left. Will need statin that does not interact with immunosuppression.  6. RSV: cough much improved - continue cough regimen, now on prednisone.   Length of Stay: [address], PA-C  12/15/2020, 7:28 AM  Advanced Heart Failure Team Pager [phone] (M-F; 7a - 5p)  Please contact [facility] for night-coverage after hours (5p -7a ) and weekends on [email or web address]   Patient seen with PA, agree with the above note.  TEE yesterday with no ASD seen, cannot rule out anomalous RUPV to SVC but not certain.    Breathing improved. I/Os not recorded yesterday.  Renal indices stable.   General: NAD Neck: JVP 8-9 cm, no thyromegaly or thyroid nodule.  Lungs: Clear to auscultation bilaterally with normal respiratory effort. CV: Nondisplaced  PMI.  Heart regular S1/S2, no S3/S4, 2/6 HSM LLSB.  2+ edema to knees.   Abdomen: Soft, nontender, no hepatosplenomegaly, no distention.  Skin: Intact without lesions or rashes.  Neurologic: Alert and oriented x 3.  Psych: Normal affect. Extremities: No clubbing or cyanosis.  HEENT: Normal.   I think we have reached the limit of aggressive diuresis for him.  I am going to stop Lasix gtt and start him on torsemide 60 mg po bid.    With elevated Qp/Qs on RHC, I am still concerned for left=>right shunt (step up between SVC and RA).  No ASD on TEE.  MRA chest today to assess pulmonary veins for anomalies.  Aim for home tomorrow.   Loralie Champagne 12/15/2020 8:50 AM

## 2020-12-15 NOTE — Consult Note (Signed)
   [facility] Inpatient Consult   12/15/2020  [name] 11-23-1955 [id number]  [facility] [ACO] Patient: Gregory Tanner [facility] Medicare  Primary Care Provider: [name], MD [facility] with the patient and the wife at the bedside to follow up regarding [place] [facility] follow up services.  Patient states, "I have pretty much gotten the fluid off that is needed. Between seeing my Heart Specialist at [facility], my Endocrinologist, and my kidney doctors, I really don't see how having another person mixed in all of this.  Tried to explain how care management can assist however, patient and wife accepted the brochure and 24 hour nurse advise line information as a benefit and encouraged to follow up with PCP for follow up.  Will sign off at transition.  Natividad Brood, RN BSN [facility] Liaison  [phone] business mobile phone Toll free office [phone]  Fax number: [phone] [email or web address]

## 2020-12-15 NOTE — Progress Notes (Signed)
PT Cancellation Note and Discharge  Patient Details Name: Gregory Tanner MRN: [id number] DOB: [birth year]   Cancelled Treatment:    Reason Eval/Treat Not Completed: New PT eval received, chart reviewed. Pt originally evaluated by Physical Therapy on 10/29 and pt was grossly functioning at a modified independent to independent level without an AD. He ambulated 338 feet. No follow up physical therapy services or DME was recommended. Discussed with CM, and it does not appear pt has any new needs. Please refer to evaluation on 10/29 for further detail. PT will again sign off.    Thelma Comp 12/15/2020, 2:43 PM  Rolinda Roan, PT, DPT Acute Rehabilitation Services Pager: [phone] Office: [phone]

## 2020-12-15 NOTE — Care Management Important Message (Signed)
Important Message  Patient Details  Name: Gregory Tanner MRN: [id number] Date of Birth: [birth year]   Medicare Important Message Given:  Yes     [name] 12/15/2020, 11:08 AM

## 2020-12-16 ENCOUNTER — Telehealth: Payer: Self-pay

## 2020-12-16 ENCOUNTER — Encounter (HOSPITAL_COMMUNITY): Payer: Self-pay | Admitting: Cardiology

## 2020-12-16 DIAGNOSIS — Z944 Liver transplant status: Secondary | ICD-10-CM

## 2020-12-16 DIAGNOSIS — I5033 Acute on chronic diastolic (congestive) heart failure: Secondary | ICD-10-CM | POA: Diagnosis not present

## 2020-12-16 LAB — GLUCOSE, CAPILLARY: Glucose-Capillary: 106 mg/dL — ABNORMAL HIGH (ref 70–99)

## 2020-12-16 LAB — CBC
HCT: 33.7 % — ABNORMAL LOW (ref 39.0–52.0)
Hemoglobin: 11.2 g/dL — ABNORMAL LOW (ref 13.0–17.0)
MCH: 29.9 pg (ref 26.0–34.0)
MCHC: 33.2 g/dL (ref 30.0–36.0)
MCV: 90.1 fL (ref 80.0–100.0)
Platelets: 107 10*3/uL — ABNORMAL LOW (ref 150–400)
RBC: 3.74 MIL/uL — ABNORMAL LOW (ref 4.22–5.81)
RDW: 16.3 % — ABNORMAL HIGH (ref 11.5–15.5)
WBC: 6.8 10*3/uL (ref 4.0–10.5)
nRBC: 0 % (ref 0.0–0.2)

## 2020-12-16 LAB — COMPREHENSIVE METABOLIC PANEL
ALT: 28 U/L (ref 0–44)
AST: 28 U/L (ref 15–41)
Albumin: 4.2 g/dL (ref 3.5–5.0)
Alkaline Phosphatase: 58 U/L (ref 38–126)
Anion gap: 9 (ref 5–15)
BUN: 112 mg/dL — ABNORMAL HIGH (ref 8–23)
CO2: 28 mmol/L (ref 22–32)
Calcium: 9.2 mg/dL (ref 8.9–10.3)
Chloride: 94 mmol/L — ABNORMAL LOW (ref 98–111)
Creatinine, Ser: 1.65 mg/dL — ABNORMAL HIGH (ref 0.61–1.24)
GFR, Estimated: 45 mL/min — ABNORMAL LOW (ref >60)
Glucose, Bld: 93 mg/dL (ref 70–99)
Potassium: 3.8 mmol/L (ref 3.5–5.1)
Sodium: 131 mmol/L — ABNORMAL LOW (ref 135–145)
Total Bilirubin: 0.9 mg/dL (ref 0.3–1.2)
Total Protein: 6.3 g/dL — ABNORMAL LOW (ref 6.5–8.1)

## 2020-12-16 MED ORDER — ATORVASTATIN CALCIUM 20 MG PO TABS: 20.0000 mg | ORAL_TABLET | Freq: Every day | ORAL | 0 refills | Status: DC

## 2020-12-16 MED ORDER — HYDRALAZINE HCL 25 MG PO TABS
12.5000 mg | ORAL_TABLET | Freq: Every day | ORAL | 0 refills | Status: DC
Start: 2020-12-16 — End: 2021-03-10

## 2020-12-16 MED ORDER — TORSEMIDE 60 MG PO TABS: 60.0000 mg | ORAL_TABLET | Freq: Two times a day (BID) | ORAL | 0 refills | Status: DC

## 2020-12-16 MED ORDER — HYDRALAZINE HCL 25 MG PO TABS: 25.0000 mg | ORAL_TABLET | Freq: Every morning | ORAL | 0 refills | Status: DC

## 2020-12-16 NOTE — Discharge Summary (Signed)
Name: Gregory Tanner MRN: [id number] DOB: 1955-07-11 65 y.o. PCP: Ronita Hipps, MD  Date of Admission: 12/05/2020  3:23 AM Date of Discharge:  12/16/2020 Attending Physician: Dr. Philipp Ovens  DISCHARGE DIAGNOSIS:  Primary Problem: Acute on chronic diastolic CHF (congestive heart failure) [place])   Hospital Problems: Principal Problem:   Acute on chronic diastolic CHF (congestive heart failure) (HCC) Active Problems:   History of liver transplant ([place])   Acute exacerbation of CHF (congestive heart failure) (HCC)   Acute respiratory distress    DISCHARGE MEDICATIONS:   Allergies as of 12/16/2020       Reactions   Iodinated Diagnostic Agents Hives, Other (See Comments)   Allergy is not to all contrast - but patient is unsure of which particular one his allergy is to. Allergy is not to all contrast - but patient is unsure of which particular one his allergy is to. Contrast dye used before liver transplant cause hives, not sure which dye this was but is able to use others   Jardiance [empagliflozin] Dermatitis   developed blisters with the medication, blisters  stopped after he discontinued taking it. (About 6 months ago /~06/2020)   Latex Other (See Comments)   Skin peeling   Metformin And Related Nausea And Vomiting   Rosiglitazone Nausea And Vomiting   GI effects and abdominal pain        Medication List     STOP taking these medications    azithromycin 250 MG tablet Commonly known as: Zithromax   bumetanide 2 MG tablet Commonly known as: Bumex   metolazone 5 MG tablet Commonly known as: ZAROXOLYN   spironolactone 25 MG tablet Commonly known as: ALDACTONE       TAKE these medications    allopurinol 300 MG tablet Commonly known as: ZYLOPRIM Take 150 mg by mouth daily.   aspirin 81 MG tablet Take 81 mg by mouth daily.   atorvastatin 20 MG tablet Commonly known as: LIPITOR Take 1 tablet (20 mg total) by mouth daily. Start taking on: December 17, 2020   Dexcom G6 Receiver Kerrin Mo Use 1 each as directed   Dexcom G6 Sensor Misc Use 1 each every 10 (ten) days   Dexcom G6 Transmitter Misc Use 1 each every 3 (three) months   dextromethorphan-guaiFENesin 30-600 MG 12hr tablet Commonly known as: MUCINEX DM Take 1 tablet by mouth 2 (two) times daily as needed for cough. What changed: when to take this   gemfibrozil 600 MG tablet Commonly known as: LOPID Take 0.5 tablets (300 mg total) by mouth in the morning and at bedtime.   hydrALAZINE 25 MG tablet Commonly known as: APRESOLINE Take 0.5 tablets (12.5 mg total) by mouth at bedtime. What changed: You were already taking a medication with the same name, and this prescription was added. Make sure you understand how and when to take each.   hydrALAZINE 25 MG tablet Commonly known as: APRESOLINE Take 1 tablet (25 mg total) by mouth every morning. Start taking on: December 17, 2020 What changed:  how much to take when to take this additional instructions   insulin lispro 100 UNIT/ML KwikPen Commonly known as: HUMALOG Inject 20-30 Units into the skin with breakfast, with lunch, and with evening meal.   multivitamin tablet Take 1 tablet by mouth daily.   mycophenolate 250 MG capsule Commonly known as: CELLCEPT Take 4 capsules (1,000 mg total) by mouth in the morning and at bedtime.   potassium chloride SA 20 MEQ tablet Commonly  known as: KLOR-CON Take 2 tablets (40 mEq total) by mouth 2 (two) times daily.   TART CHERRY PO Take 1 tablet by mouth in the morning and at bedtime.   Torsemide 60 MG Tabs Take 60 mg by mouth 2 (two) times daily.               Discharge Care Instructions  (From admission, onward)           Start     Ordered   12/16/20 0000  Discharge wound care:       Comments: Clean the right great toe with NS, pat dry then place a small piece of Xeroform gauze Kellie Simmering # 295) over the wound and wrap with small Mancel Parsons Kellie Simmering # [phone]). Change  daily.   12/16/20 0948   12/16/20 0000  Discharge wound care:       Comments: Clean the right great toe with NS, pat dry then place a small piece of Xeroform gauze over the wound and wrap with small Kerlix. Change daily.   12/16/20 0948            DISPOSITION AND FOLLOW-UP:  Mr.Casten D Lineback was discharged from [facility] in Stable condition. At the hospital follow up visit please address:  Follow-up Recommendations: Consults: Cardiology, Recommend follow up with transplant team  Labs: Basic Metabolic Profile Medications: STOP spironolactone, bumex, and metolazone. START torsemide 60 mg twice daily and atorvastatin 20 mg daily. Continue aspirin 81 mg, Kcl 40 mEq twice daily. CHANGE how you take hydralazine: take 25 mg each morning, and 12.5 mg each night.   Follow-up Appointments:  Follow-up Information     Ronita Hipps, MD Follow up in 1 week(s).   Specialty: Family Medicine Why: hospital follow up Contact information: [place], Brian J, MD .   Specialties: Cardiology, Radiology Contact information: [place] [address] [phone]         Enis Gash, MD .   Specialty: Internal Medicine Contact information: [facility] [place] [address] [place]:  Patient Summary: #Right-sided Heart Failure #HFpEF  Initial history of heart failure started as right-sided only with extensive work-up in December 2021 at [facility] at which time he was also diagnosed with pulmonary hypertension. RHC at the time demonstrated elevated pulmonary pressure with high-output heart failure and a 3:1 cardiac shunt. Cardiac MRI demonstrated severely dilated RV with reduced RVEF of 38%.  No evidence of clinically significant anatomical shunt or infiltrative disease noted on cMRI. TTE negative for platypnea-orthodeoxia. Further workup  regarding high output HF with abdominal MRI negative for any abdominal shunt or fistula. With recent admission from 10/09 - 10/20, there is evidence of left-sided failure although with preserved EF.   During his hospitalization here, his volume status was monitored as well as strict I's and O's and weights.  On admit, patient was hypervolemic on exam, though this had improved since his recent discharge.  He was initially given IV Lasix 80 mg twice daily, and his home spironolactone regimen was resumed.  The following day on 10/23, patient was transitioned to his home diuretic regimen of Bumex 3 mg twice daily, metolazone Monday, Wednesday, Friday, Saturday, and spironolactone 25 mg twice daily. On 10/24, given patient's complex picture, we consulted the heart failure team. At  that time, metolazone and Bumex were discontinued, and patient given 1x tolvaptan 15 mg and placed on IV Lasix 80 mg twice daily.  Unna boots were applied bilaterally given mild left PAD, no PAD on the right side. The following day, IR assessed the patient and did not find a pocket of fluid for paracentesis.  He was continued on IV Lasix at that time, and patient had output of 3 L that day with weight around 84 kg, which is below his dry weight.  The following day, we continued to follow heart failure recommendations for diuresis: IV Lasix 80 mg twice daily, given another dose of tolvaptan 15 mg.  The following day, continued IV Lasix 80 mg twice daily as well as home spiro.  Repeat chest x-ray was obtained that day and showed stable pulmonary vascular congestion.  On 10/28, patient underwent RHC, and IV Lasix and tolvaptan were given the same day.  Results showed mildly elevated PCWP, predominantly right-sided heart failure, possible ASD.  On 10/29, patient transition to Lasix GTT at 10 mg/h.  Also given 30 mg tolvaptan again.  Heart failure team planned for TEE on Monday 10/31.  On 10/31, TEE showed No PFO or ASD noted, negative bubble  study. However, could not rule out anomalous right upper pulmonary vein connecting to SVC (concerning for this in several images but not definitive).  Therefore, patient was scheduled for MRI angio chest with/without contrast, and this rule out an anomalous pulmonary vein. On 11/1, the lasix drip was discontinued and the patient was started on torsemide 60 mg bid. He will be discharged with torsemide 60 mg bid, asa 81 mg daily, atorvastatin 20 mg daily, hydralazine 25 mg q AM and 12.5 mg q PM, and Kcl 40 mEq bid.   #Acute hypoxic respiratory failure: Patient was recently admitted to our service for an acute diastolic heart failure exacerbation.  Patient was aggressively diuresed and had improvement in his lower extremity edema and was discharged home on October 28.  Patient did note worsening cough over the last 2 to 3 days prior to discharge and was started on a Z-Pak for possible acute bronchitis.  Chest x-ray done at that time showed unchanged pulmonary vascular congestion.  At the time of discharge he was at his dry weight of approximately 191 pounds.    Patient presented ED on 10/22 with progressively worsening shortness of breath and dyspnea on exertion as well as a nonproductive cough which worsened over the last 2 days since discharge.  Patient also currently requiring supplemental oxygen with O2 sats at 96% on 3 L.  His chest x-ray shows pulmonary vascular congestion which is unchanged from his prior one at discharge. CT chest on 10/22 negative for pleural effusion, consolidation, or pulmonary edema. Respiratory viral panel positive for RSV.  He did however remain afebrile with no leukocytosis.   Patient does have history of chronic diastolic heart failure and is noted to have an elevated BNP on admission as well as 2+ bilateral lower extremity pitting edema and JVD elevated to the mandible consistent with an acute on chronic diastolic heart failure exacerbation. However, he also has a worsening  cough and shortness of breath which is not typical for his usual heart failure exacerbations (usually has evidence of right-sided heart failure but no dyspnea). His dyspnea is likely multifactorial from RSV infection and pulmonary hypertension. Given he does not have a history of lung or cardiac transplant and time since symptom onset unlikely to benefit from antivirals and  therefore was continued on supportive care throughout his hospitalization.  The patient's main complaint during this hospitalization was a dry cough.  He was continued on supplemental oxygen as needed and was initially placed on duo nebs, and he endorsed fewer dry cough spells with this. On 10/26, he states that his dry cough had much improved and only endorses with exertion.  At that time, we started patient on Tussionex regimen.  On 10/27, patient endorsed no improvement with current regimen, therefore he was started on prednisone 40 mg p.o. daily.  This was continued for a total of 5 days in the hospital, and the patient endorsed significant improvement of his cough as result.  He also states that his cough improved with drinking tea with honey.    # CKD Stage 3b  Baseline between 2.2 - 2.5. Patient's creatinine levels were within baseline, and in the final days of his hospitalization, creatinine levels were actually below his baseline.    # Cryptogenic Cirrhosis c/b HCC s/p transplant (2013) Patient was continued on his home mycophenolate 1,000 mg twice daily out his hospitalization. Recommended for close follow up with his transplant team as his RV failure is due to high output HF in the setting of his liver disease.    # Insulin-Dependent Type 2 Diabetes Mellitus  A1c 5.2, CBGs well controlled throughout his hospitalization on sliding scale insulin.   # Pulmonary Hypertension  Etiology uncertain. MRI in February with portal hypertension with splenomegaly, ascites and enlarged venous vasculature suggestive of portopulmonary  hypertension.  Will need outpatient follow up with pulmonary hypertension clinic at [facility] (previously referred, but patient has not arranged an appointment).   #Thoracic Aortic aneurysm 4.0 cm aneurysm of the ascending aorta. Incidental finding on CT chest on 10/22. Patient will need annual CTA/MRA for surveillance.  #PAD Right ABI 1.04, Left ABI 0.73. Started on moderate-intensity statin that does not interact with mycophenolate. Continued atorvastatin 20 mg po daily during his hospitalization.  #Right great toe wound Patient noted to have nail loss of right great toe with excoriation over the nail area at his last admission. Wound care evaluated the patient at that time and noted scant, serous drainage on the dressing with recommended daily dressing changes. Will be discharged with home health wound care.      DISCHARGE INSTRUCTIONS:   Discharge Instructions     (HEART FAILURE PATIENTS) Call MD:  Anytime you have any of the following symptoms: 1) 3 pound weight gain in 24 hours or 5 pounds in 1 week 2) shortness of breath, with or without a dry hacking cough 3) swelling in the hands, feet or stomach 4) if you have to sleep on extra pillows at night in order to breathe.   Complete by: As directed    Call MD for:  redness, tenderness, or signs of infection (pain, swelling, redness, odor or green/yellow discharge around incision site)   Complete by: As directed    Diet - low sodium heart healthy   Complete by: As directed    Discharge wound care:   Complete by: As directed    Clean the right great toe with NS, pat dry then place a small piece of Xeroform gauze Kellie Simmering # 295) over the wound and wrap with small Mancel Parsons Kellie Simmering # [phone]). Change daily.   Discharge wound care:   Complete by: As directed    Clean the right great toe with NS, pat dry then place a small piece of Xeroform gauze over the wound and  wrap with small Kerlix. Change daily.   Increase activity slowly   Complete by: As  directed    Increase activity slowly   Complete by: As directed       FOLLOW-UP INSTRUCTIONS:  Thank you for allowing [place] to be part of your care. You were hospitalized for Acute on chronic diastolic CHF (congestive heart failure) ([place])  Please follow up with the following providers: A. Ronita Hipps, MD, [place] / [place] [address], [phone]  B. Your transplant team  C. Cardiologist:  Richardo Priest, MD Richardo Priest, MD PCP - Cardiology Cardiology Radiology [phone] [facility] Cardiovascular Division [address] [place] [address]    Please note these changes made to your medications:   A. Medications to continue: Current Meds  Medication Sig   allopurinol (ZYLOPRIM) 300 MG tablet Take 150 mg by mouth daily.    aspirin 81 MG tablet Take 81 mg by mouth daily.           Continuous Blood Gluc Receiver (DEXCOM G6 RECEIVER) DEVI Use 1 each as directed   Continuous Blood Gluc Sensor (DEXCOM G6 SENSOR) MISC Use 1 each every 10 (ten) days   Continuous Blood Gluc Transmit (DEXCOM G6 TRANSMITTER) MISC Use 1 each every 3 (three) months   dextromethorphan-guaiFENesin (MUCINEX DM) 30-600 MG 12hr tablet Take 1 tablet by mouth 2 (two) times daily as needed for cough. (Patient taking differently: Take 1 tablet by mouth 4 (four) times daily as needed for cough.)   gemfibrozil (LOPID) 600 MG tablet Take 0.5 tablets (300 mg total) by mouth in the morning and at bedtime.       insulin lispro (HUMALOG) 100 UNIT/ML KwikPen Inject 20-30 Units into the skin with breakfast, with lunch, and with evening meal.       Multiple Vitamin (MULTIVITAMIN) tablet Take 1 tablet by mouth daily.   mycophenolate (CELLCEPT) 250 MG capsule Take 4 capsules (1,000 mg total) by mouth in the morning and at bedtime.   potassium chloride SA (KLOR-CON) 20 MEQ tablet Take 2 tablets (40 mEq total) by mouth 2 (two) times daily.   spironolactone (ALDACTONE) 25 MG tablet Take 1 tablet (25 mg  total) by mouth 2 (two) times daily.   TART CHERRY PO Take 1 tablet by mouth in the morning and at bedtime.      B. Medications to start:     atorvastatin (LIPITOR) tablet 20 mg, 20 mg, Oral, Daily, [name], MD, 20 mg at 12/16/20 0926   hydrALAZINE (APRESOLINE) tablet 25 mg, Oral, every morning and 12.5 mg every night   torsemide (DEMADEX) tablet 60 mg, 60 mg, Oral, twice daily   C. Medications to discontinue:  STOP Bumex, Spironolactone, and Metolazone   Please make sure to follow up with your heart doctor and liver transplant doctors.   Please call our clinic if you have any questions or concerns, we may be able to help and keep you from a long and expensive emergency room wait. Our clinic and after hours phone number is [phone], the best time to call is Monday through Friday 9 am to 4 pm but there is always someone available 24/7 if you have an emergency. If you need medication refills please notify your pharmacy one week in advance and they will send [place] a request.    SUBJECTIVE:  Mr. Foti was seen and evaluated on the day of discharge. He is doing well overall and notes that his cough is much  improved, and is only mild in the mornings. His breathing is also improved, and he notes that it is "the best it's been". His lower extremity swelling is improved, as well. He is excited to go home today.   Discharge Vitals:   BP 122/65   Pulse 87   Temp 98 F (36.7 C) (Oral)   Resp 18   Ht 5\' 10"  (1.778 m)   Wt 83.3 kg   SpO2 98%   BMI 26.36 kg/m   OBJECTIVE:  Physical Exam  General: Pleasant, well-appearing male sitting in bedside chair. No acute distress. CV: RRR. Systolic murmur. 1+ bilateral pitting edema to knees. Pulmonary: Lungs CTAB. Normal effort.  Abdominal: Soft, nontender, nondistended. Normal bowel sounds. Extremities: Palpable radial and DP pulses. Normal ROM. Bilateral LE in unna boots. Skin: Warm and dry. No obvious rash or lesions. Neuro:  A&Ox3. Moves all extremities. Normal sensation. No focal deficit. Psych: Normal mood and affect   Pertinent Labs, Studies, and Procedures:  CBC Latest Ref Rng & Units 12/16/2020 12/15/2020 12/14/2020  WBC 4.0 - 10.5 K/uL 6.8 6.2 5.2  Hemoglobin 13.0 - 17.0 g/dL 11.2(L) 11.1(L) 10.9(L)  Hematocrit 39.0 - 52.0 % 33.7(L) 33.7(L) 33.8(L)  Platelets 150 - 400 K/uL 107(L) 116(L) 115(L)    CMP Latest Ref Rng & Units 12/16/2020 12/15/2020 12/14/2020  Glucose 70 - 99 mg/dL 93 105(H) 125(H)  BUN 8 - 23 mg/dL 112(H) 124(H) 133(H)  Creatinine 0.61 - 1.24 mg/dL 1.65(H) 1.83(H) 1.89(H)  Sodium 135 - 145 mmol/L 131(L) 128(L) 131(L)  Potassium 3.5 - 5.1 mmol/L 3.8 3.7 3.2(L)  Chloride 98 - 111 mmol/L 94(L) 89(L) 90(L)  CO2 22 - 32 mmol/L 28 28 29   Calcium 8.9 - 10.3 mg/dL 9.2 9.6 9.8  Total Protein 6.5 - 8.1 g/dL 6.3(L) 6.6 6.9  Total Bilirubin 0.3 - 1.2 mg/dL 0.9 1.5(H) 1.0  Alkaline Phos 38 - 126 U/L 58 55 57  AST 15 - 41 U/L 28 26 24   ALT 0 - 44 U/L 28 25 22     CT CHEST WO CONTRAST  Result Date: 12/05/2020 CLINICAL DATA:  Respiratory illness, nondiagnostic x-ray. Shortness of breath. EXAM: CT CHEST WITHOUT CONTRAST TECHNIQUE: Multidetector CT imaging of the chest was performed following the standard protocol without IV contrast. COMPARISON:  Chest x-ray 12/05/2020 and CT angio chest on 08/02/2010 FINDINGS: Cardiovascular: There is four-chamber enlargement of heart. Coronary artery calcifications are present. The ascending aorta is aneurysmal, 4.0 centimeters. The pulmonary arteries are diffusely enlarged./RIGHT pulmonary artery is 3.1 centimeters. Mediastinum/Nodes: Esophagus is unremarkable. A 0.9 centimeter low-attenuation circumscribed lesion is identified in the RIGHT lobe of the thyroid. No significant mediastinal hilar, or axillary adenopathy. Lungs/Pleura: Motion degraded images. No focal consolidations or pleural effusions. No pulmonary edema or consolidation. Upper Abdomen: No acute  abnormality. Musculoskeletal: Moderate midthoracic degenerative changes. IMPRESSION: 1. Significant cardiomegaly. 2. Thoracic aortic aneurysm. Recommend annual imaging followup by CTA or MRA. This recommendation follows 2010 ACCF/AHA/AATS/ACR/ASA/SCA/SCAI/SIR/STS/SVM Guidelines for the Diagnosis and Management of Patients with Thoracic Aortic Disease. Circulation. 2010; 121: W656-C127. Aortic aneurysm NOS (ICD10-I71.9) 3. Marked pulmonary artery enlargement consistent with pulmonary arterial hypertension. 4. Coronary artery calcifications. 5.  Aortic atherosclerosis.  (ICD10-I70.0) 6. Subcentimeter incidental right thyroid nodule. No follow-up imaging is recommended. Reference: J Am Coll Radiol. 2015 Feb;12(2): 143-50 Electronically Signed   By: Nolon Nations M.D.   On: 12/05/2020 13:59   DG Chest Port 1 View  Result Date: 12/05/2020 CLINICAL DATA:  Shortness of breath. EXAM: PORTABLE CHEST 1 VIEW COMPARISON:  12/03/2020 FINDINGS: Stable mild cardiac enlargement and low lung volumes. Prominent bilateral hilar markings are identified which may reflect PA hypertension there is diffuse pulmonary vascular congestion. Subsegmental atelectasis noted in the left base. No airspace consolidation. IMPRESSION: 1. Suspect pulmonary vascular congestion. 2. Left base atelectasis. 3. Prominent pulmonary arteries concerning for PA hypertension. Electronically Signed   By: Kerby Moors M.D.   On: 12/05/2020 06:26     Signed: Buddy Duty, D.O.  Internal Medicine Resident, PGY-1 [facility]  Pager: [phone] 9:51 AM, 12/16/2020

## 2020-12-16 NOTE — Telephone Encounter (Signed)
Received a TC from patient's wife who states patient was discharged from the hospital and did not receive one of his RX's, the Atorvastatin. Per chart review, Atorvastatin was sent to [facility] w/ receipt confirmation.  RN informed wife she will resend Careers information officer.  TC to [facility], spoke with the pharmacist, Almyra Free who states they did not receive the RX for Atorvastatin. VO given for Atorvastatin 20mg , Take 1 tablet by mouth daily.  #30 with no refills. Dr. Janyth Contes

## 2020-12-16 NOTE — Discharge Instructions (Addendum)
FOLLOW-UP INSTRUCTIONS:  Thank you for allowing [place] to be part of your care. You were hospitalized for Acute on chronic diastolic CHF (congestive heart failure) ([place])   Please follow up with the following providers: A. Ronita Hipps, MD, [name] [address] [place] [address], [phone]  B. Transplant team  C. Cardiologist:  Richardo Priest, MD Richardo Priest, MD PCP - Cardiology Cardiology Radiology [phone] [facility] [address] [place] [address]     Please note these changes made to your medications:   A. Medications to continue: Current Meds  Medication Sig   allopurinol (ZYLOPRIM) 300 MG tablet Take 150 mg by mouth daily.    aspirin 81 MG tablet Take 81 mg by mouth daily.           Continuous Blood Gluc Receiver (DEXCOM G6 RECEIVER) DEVI Use 1 each as directed   Continuous Blood Gluc Sensor (DEXCOM G6 SENSOR) MISC Use 1 each every 10 (ten) days   Continuous Blood Gluc Transmit (DEXCOM G6 TRANSMITTER) MISC Use 1 each every 3 (three) months   dextromethorphan-guaiFENesin (MUCINEX DM) 30-600 MG 12hr tablet Take 1 tablet by mouth 2 (two) times daily as needed for cough. (Patient taking differently: Take 1 tablet by mouth 4 (four) times daily as needed for cough.)   gemfibrozil (LOPID) 600 MG tablet Take 0.5 tablets (300 mg total) by mouth in the morning and at bedtime.       insulin lispro (HUMALOG) 100 UNIT/ML KwikPen Inject 20-30 Units into the skin with breakfast, with lunch, and with evening meal.       Multiple Vitamin (MULTIVITAMIN) tablet Take 1 tablet by mouth daily.   mycophenolate (CELLCEPT) 250 MG capsule Take 4 capsules (1,000 mg total) by mouth in the morning and at bedtime.   potassium chloride SA (KLOR-CON) 20 MEQ tablet Take 2 tablets (40 mEq total) by mouth 2 (two) times daily.   spironolactone (ALDACTONE) 25 MG tablet Take 1 tablet (25 mg total) by mouth 2 (two) times daily.   TART CHERRY PO Take 1 tablet by mouth in  the morning and at bedtime.      B. Medications to start:     atorvastatin (LIPITOR) tablet 20 mg, 20 mg, Oral, Daily, [name], MD, 20 mg at 12/16/20 0926   hydrALAZINE (APRESOLINE) tablet 25 mg, Oral, every morning and 12.5 mg every night   torsemide (DEMADEX) tablet 60 mg, 60 mg, Oral, twice daily   C. Medications to discontinue:  STOP Bumex, Spironolactone, and Metolazone   Please make sure to follow up with your heart doctor and liver transplant doctors.   Please call our clinic if you have any questions or concerns, we may be able to help and keep you from a long and expensive emergency room wait. Our clinic and after hours phone number is [phone], the best time to call is Monday through Friday 9 am to 4 pm but there is always someone available 24/7 if you have an emergency. If you need medication refills please notify your pharmacy one week in advance and they will send [place] a request.

## 2020-12-16 NOTE — Progress Notes (Signed)
OT Cancellation Note  Patient Details Name: Gregory Tanner MRN: [id number] DOB: 1955/05/11   Cancelled Treatment:    Reason Eval/Treat Not Completed: OT screened, no needs identified, will sign off Pt previously screened by OT on 10/29 with no needs identified based on PT eval on 10/29. Discussed pt with RN who advises that pt is still functioning at Modified Independent to Independent level with no OT needs indicated. Will sign off.  Layla Maw 12/16/2020, 9:31 AM

## 2020-12-16 NOTE — TOC Initial Note (Signed)
Transition of Care [facility]) - Initial/Assessment Note    Patient Details  Name: Gregory Tanner MRN: [id number] Date of Birth: 06-24-1955  [name]) CM/SW Contact:    Erenest Rasher, RN Phone Number: [phone] 12/16/2020, 10:15 AM  Clinical Narrative:                 HF TOC CM spoke to pt and wife. Agreeable to [facility]. Offered choice and agreeable to agency that will accept insurance in his area. [place], [facility] Marrow with new referral. Has DME in home.   Expected Discharge Plan: [place] Barriers to Discharge: No Barriers Identified   Patient Goals and CMS Choice Patient states their goals for this hospitalization and ongoing recovery are:: wants to remain independent CMS Medicare.gov Compare Post Acute Care list provided to:: Patient Represenative (must comment) Choice offered to / list presented to : Spouse  Expected Discharge Plan and Services Expected Discharge Plan: [place] In-house Referral: Clinical Social Work Discharge Planning Services: CM Consult Post Acute Care Choice: [place] arrangements for the past 2 months: [place] Expected Discharge Date: 12/16/20                         HH Arranged: RN [place] Agency: [place] ([place]) Date HH Agency Contacted: 12/16/20 Time HH Agency Contacted: 52 Representative spoke with at [place]: Elijio Miles  Prior Living Arrangements/Services Living arrangements for the past 2 months: [place] with:: Spouse Patient language and need for interpreter reviewed:: Yes Do you feel safe going back to the place where you live?: Yes      Need for Family Participation in Patient Care: Yes (Comment) Care giver support system in place?: Yes (comment) Current home services: DME (rolling walker) Criminal Activity/Legal Involvement Pertinent to Current Situation/Hospitalization: No - Comment as  needed  Activities of Daily Living Home Assistive Devices/Equipment: None ADL Screening (condition at time of admission) Patient's cognitive ability adequate to safely complete daily activities?: Yes Is the patient deaf or have difficulty hearing?: No Does the patient have difficulty seeing, even when wearing glasses/contacts?: No Does the patient have difficulty concentrating, remembering, or making decisions?: No Patient able to express need for assistance with ADLs?: Yes Does the patient have difficulty dressing or bathing?: No Independently performs ADLs?: Yes (appropriate for developmental age) Does the patient have difficulty walking or climbing stairs?: No Weakness of Legs: None Weakness of Arms/Hands: None  Permission Sought/Granted Permission sought to share information with : Case Manager, PCP, Family Supports Permission granted to share information with : Yes, Verbal Permission Granted  Share Information with NAME: Ata Pecha  Permission granted to share info w AGENCY: [place] granted to share info w Relationship: wife  Permission granted to share info w Contact Information: [phone]  Emotional Assessment       Orientation: : Oriented to Self, Oriented to Place, Oriented to  Time, Oriented to Situation   Psych Involvement: No (comment)  Admission diagnosis:  Acute respiratory distress [R06.03] Acute exacerbation of CHF (congestive heart failure) ([place]) [I50.9] Patient Active Problem List   Diagnosis Date Noted   Acute respiratory distress 12/05/2020   Ascites    Thrombocytopenia ([place]) 11/23/2020   Iron deficiency anemia 11/23/2020   Acute exacerbation of CHF (congestive heart failure) ([place]) 11/22/2020   Volume overload 01/22/2020   PHT (pulmonary hypertension) ([place]) 01/19/2020  Swelling    Liver disease    Hypertension    Hearing loss    Diabetes mellitus without complication ([place])    Hypokalemia    Acute on chronic diastolic CHF  (congestive heart failure) ([place]) 07/24/2019   Uncontrolled type 2 diabetes mellitus with insulin therapy 06/21/2018   CKD (chronic kidney disease) stage 3, GFR 30-59 ml/min (HCC) 03/27/2017   Chronic diastolic heart failure ([employer]) 09/26/2016   Chronic pain disorder 10/28/2015   SOB (shortness of breath) on exertion 10/28/2015   Hypertensive heart disease with heart failure ([place]) 10/26/2015   Benign essential hypertension 10/26/2015   Chronic pain associated with significant psychosocial dysfunction 06/17/2015   Nerve root pain 06/17/2015   Ankylosis of lumbar spine 02/21/2014   Degeneration of intervertebral disc of lumbar region 07/05/2013   Lumbar radiculopathy 10/10/2012   Degenerative disc disease, lumbar 07/26/2012   Encounter for other specified prophylactic measures 10/10/2011   Excess weight 10/10/2011   History of liver transplant ([place]) 07/20/2011   Type 2 diabetes mellitus ([place]) 12/27/2010   Hepatocellular carcinoma ([place]) 12/27/2010   BP (high blood pressure) 12/27/2010   PCP:  Ronita Hipps, MD Pharmacy:   [facility] [place] [address] Phone: [phone] Fax: [phone]     Social Determinants of Health (SDOH) Interventions    Readmission Risk Interventions Readmission Risk Prevention Plan 12/03/2020  Transportation Screening Complete  PCP or Specialist Appt within 3-5 Days Complete  HRI or [place] Complete  Social Work Consult for [place] Planning/Counseling Complete  Palliative Care Screening Not Applicable  Medication Review Press photographer) Complete  Some recent data might be hidden

## 2020-12-16 NOTE — Progress Notes (Addendum)
Patient ID: Gregory Tanner, male   DOB: May 02, 1955, 65 y.o.   MRN: [id number]     Advanced Heart Failure Rounding Note  PCP-Cardiologist: Shirlee More, MD   Subjective:    TEE 10/31: EF 60-65%, RV mildly reduced w/ D-shaped septum.  No PFO or ASD noted, negative bubble study. Concern for anomalous RUPV to SVC.  Chest MRA 11/1: showed no anomalous pulmonary venous return. Qp/Qs 1.2.   Transitioned to PO diuretics yesterday, torsemide 60 mg bid. 2.2L in UOP yesterday. Wt down 2 lb.   SCr/BUN trending down, 1.89>>1.65 and 133>>124>>112  Feels well today. Much improved. Denies dyspnea. Cough much better. No CP.    RHC Procedural Findings: Hemodynamics (mmHg) RA mean 15 with prominent v-waves to 21 RV 65/12 PA 66/25, mean 43 PCWP mean 17 Oxygen saturations: SVC 67% RA 84% RV 83% PA 85% PVs 94% AO 95% Cardiac Output (Fick) 19  Cardiac Index (Fick) 9.58 PVR 1.4 WU  PAPI 2.7  Qp/Qs 3.1  Objective:   Weight Range: 83.3 kg Body mass index is 26.36 kg/m.   Vital Signs:   Temp:  [97.7 F (36.5 C)-98 F (36.7 C)] 98 F (36.7 C) (11/01 2005) Pulse Rate:  [87-88] 87 (11/01 2005) Resp:  [18-19] 18 (11/01 2005) BP: (113-122)/(55-65) 122/65 (11/01 2154) SpO2:  [98 %] 98 % (11/01 2005) Weight:  [83.3 kg] 83.3 kg (11/02 0600) Last BM Date: 12/13/20  Weight change: Filed Weights   12/14/20 1046 12/15/20 0700 12/16/20 0600  Weight: 83.3 kg 83.9 kg 83.3 kg    Intake/Output:   Intake/Output Summary (Last 24 hours) at 12/16/2020 0740 Last data filed at 12/15/2020 2201 Gross per 24 hour  Intake 746.84 ml  Output 2182 ml  Net -1435.16 ml      Physical Exam    General:  Well appearing, out of bed and in chair. No respiratory difficulty HEENT: normal Neck: supple. JVD 9 cm. Carotids 2+ bilat; no bruits. No lymphadenopathy or thyromegaly appreciated. Cor: PMI nondisplaced. Regular rate & rhythm. No rubs, gallops or murmurs. Lungs: clear Abdomen: soft, nontender,  nondistended. No hepatosplenomegaly. No bruits or masses. Good bowel sounds. Extremities: no cyanosis, clubbing, rash, 1+ bilateral LE edema to knees  Neuro: alert & oriented x 3, cranial nerves grossly intact. moves all 4 extremities w/o difficulty. Affect pleasant.  Telemetry   NSR 90s (personally reviewed)  Labs    CBC Recent Labs    12/15/20 0421 12/16/20 0409  WBC 6.2 6.8  HGB 11.1* 11.2*  HCT 33.7* 33.7*  MCV 90.6 90.1  PLT 116* 166*   Basic Metabolic Panel Recent Labs    12/15/20 0421 12/16/20 0409  NA 128* 131*  K 3.7 3.8  CL 89* 94*  CO2 28 28  GLUCOSE 105* 93  BUN 124* 112*  CREATININE 1.83* 1.65*  CALCIUM 9.6 9.2   Liver Function Tests Recent Labs    12/15/20 0421 12/16/20 0409  AST 26 28  ALT 25 28  ALKPHOS 55 58  BILITOT 1.5* 0.9  PROT 6.6 6.3*  ALBUMIN 4.5 4.2   No results for input(s): LIPASE, AMYLASE in the last 72 hours. Cardiac Enzymes No results for input(s): CKTOTAL, CKMB, CKMBINDEX, TROPONINI in the last 72 hours.  BNP: BNP (last 3 results) Recent Labs    11/22/20 0622 11/29/20 1015 12/05/20 0510  BNP 352.1* 222.9* 237.7*    ProBNP (last 3 results) No results for input(s): PROBNP in the last 8760 hours.   D-Dimer No results for input(s): DDIMER  in the last 72 hours. Hemoglobin A1C No results for input(s): HGBA1C in the last 72 hours. Fasting Lipid Panel No results for input(s): CHOL, HDL, LDLCALC, TRIG, CHOLHDL, LDLDIRECT in the last 72 hours. Thyroid Function Tests No results for input(s): TSH, T4TOTAL, T3FREE, THYROIDAB in the last 72 hours.  Invalid input(s): FREET3  Other results:   Imaging    MR ANGIO CHEST W WO CONTRAST  Result Date: 12/15/2020 CLINICAL DATA:  ?Anomalous pulmonary venous return. EXAM: CARDIAC MRI TECHNIQUE: This study was reported with the cardiac MRI on the same day. [name] Electronically Signed   By: Loralie Champagne M.D.   On: 12/15/2020 19:17   MR CARDIAC MORPHOLOGY WO  CONTRAST  Result Date: 12/15/2020 CLINICAL DATA:  ?Anomalous pulmonary venous return EXAM: CARDIAC MRI TECHNIQUE: The patient was scanned on a 1.5 Tesla GE magnet. A dedicated cardiac coil was used. Functional imaging was done using Fiesta sequences. 2,3, and 4 chamber views were done to assess for RWMA's. Limited cine images were done. The patient received 8 cc of Gadavist for MR angiography. FINDINGS: Limited images of the lung fields showed no gross abnormalities. Limited cine images were done. The right ventricle is moderately dilated with normal systolic function. Mildly D-shaped interventricular septum suggests RV pressure/volume overload. The right atrium is severely dilated. Tricuspid regurgitation appears moderate. The left ventricle is normal in size with normal systolic function, EF 38-75%. Normal left atrial size. Arch vessels originate normally from the thoracic aorta. Mildly dilated ascending aorta, 3.7 cm. The pulmonary veins drain normally to the left atrium. The IVC was enlarged. The structure seen by TEE with concern for anomalous right upper PV to SVC actually appears to be a dilated thoracic duct. IMPRESSION: There does not appear to be anomalous pulmonary venous return. The structure seen by TEE joining the SVC may have been a dilated thoracic duct. [name] Electronically Signed   By: Loralie Champagne M.D.   On: 12/15/2020 19:17     Medications:     Scheduled Medications:  aspirin EC  81 mg Oral Daily   atorvastatin  20 mg Oral Daily   benzonatate  200 mg Oral TID   chlorpheniramine-HYDROcodone  5 mL Oral Q12H   docusate sodium  100 mg Oral BID   enoxaparin (LOVENOX) injection  40 mg Subcutaneous Q24H   hydrALAZINE  25 mg Oral q morning   And   hydrALAZINE  12.5 mg Oral QHS   insulin aspart  0-9 Units Subcutaneous TID WC   LORazepam  0.5 mg Oral Once   mycophenolate  1,000 mg Oral BID   potassium chloride  40 mEq Oral BID   sodium chloride flush  3 mL Intravenous Q12H    sodium chloride flush  3 mL Intravenous Q12H   torsemide  60 mg Oral BID    Infusions:  sodium chloride 10 mL/hr at 12/13/20 1200    PRN Medications: sodium chloride, acetaminophen **OR** acetaminophen, alum & mag hydroxide-simeth, dextromethorphan-guaiFENesin, ipratropium-albuterol, lip balm, menthol-cetylpyridinium, methocarbamol, ondansetron (ZOFRAN) IV, phenol, polyethylene glycol    Patient Profile   65 y/o male w/ h/o NAFLD and hepatocellular carcinoma with liver transplant.  Now with RV failure in the setting of high output HF, has had extensive workup at [facility] with no cardiac or peripheral shunt discovered.  CKD stage III with creatinine 2.5 at baseline.    He had a recent admission here with CHF, not seen by cardiology.  Readmitted < 1 week post-discharge with wheezing and dyspnea, found  to have recurrent CHF as well as RSV infection.   Assessment/Plan   1. Acute on chronic diastolic CHF with prominent RV failure: High output HF by last RHC in 12/21 at [facility].  Cardiac MRI in 12/21 at [facility] showed EF 74%, D-shaped septum, enlarged RV with RV EF 37%, Qp/Qs 1 with no evidence for significant shunt lesion, no pulmonary vein anomalies.  Echo this admission with normal EF 65-70%, moderate RV enlargement/mildly decreased RV systolic function, moderate TR.  MRI abdomen in 2/22 showed no evidence for AV shunt or fistula.   He has had extensive workup at [facility] with no definite intra- or extra-cardiac shunt lesion found to explain high output. He has struggled with RV failure, admitted markedly volume overloaded but this was complicated by cardiorenal syndrome with markedly high BUN.  RHC with mildly elevated PCWP, more significantly elevated RA pressure, primarily RV failure, PAPi ok at 2.7, pulmonary venous hypertension in setting of high cardiac output. Also noted significant step up between SVC and RA, suggesting ASD with Qp/Qs 3.1.  TEE showed no ASD.  There was concern for possible anomalous  RUPV to SVC however MRA of chest showed  no anomalous pulmonary venous return. The structure seen by TEE joining the SVC may have been a dilated thoracic duct. He has diuresed 15 lb. Still right-sided HF on exam with gradual improvement. Renal indices improving. Now on PO torsemide  - Continue torsemide 60 mg bid  - C/w unna boots (ABIs not markedly abnormal).  - US abdomen with no drainable ascites - SGLT2 inhibitor stopped due to perineal yeast infection.  2. CKD stage 3: Stable renal indices improving. BUN remains elevated but steadily trending down. Suspect cardiorenal syndrome.  - BP stable, does not appear to need midodrine.  3. Hyponatremia:  Hypervolemic hyponatremia. Na 128>>131 today - Fluid restrict. 4. H/o liver transplant: Followed at [facility]. On mycophenolate.  Off tacrolimus due to side effects.  5. PAD: Moderate on left. Will need statin that does not interact with immunosuppression.  6. RSV: cough much improved - continue cough regimen, now on prednisone.   Stable for d/c home from CHF standpoint.   Cardiac Meds for Discharge ASA 81 mg daily  Atorvastatin 20 mg daily  Hydralazine 25 mg qam/ 12.5 mg qpm KCL 40 mEq BID Torsemide 60 mg bid   He will f/u w/ his cardiologist at [facility] of Stay: [address] PA-C  12/16/2020, 7:40 AM  Advanced Heart Failure Team Pager [phone] (M-F; 7a - 5p)  Please contact [facility] for night-coverage after hours (5p -7a ) and weekends on [email or web address]   Patient seen with PA, agree with the above note.   BUN/creatinine trending down, weight continues to trend down.  Breathing improved.   MRA yesterday did NOT show anomalous pulmonary venous drainage and Qp/Qs was 1.2.    General: NAD Neck: JVP 8-9 cm, no thyromegaly or thyroid nodule.  Lungs: Clear to auscultation bilaterally with normal respiratory effort. CV: Nondisplaced PMI.  Heart regular S1/S2, no S3/S4, 2/6 HSM LLSB.  2+ edema to knees.  Abdomen: Soft,  nontender, no hepatosplenomegaly, no distention.  Skin: Intact without lesions or rashes.  Neurologic: Alert and oriented x 3.  Psych: Normal affect. Extremities: No clubbing or cyanosis.  HEENT: Normal.   Volume overall better.  Still with evidence of RV failure, but will aim for slow diuresis over time with torsemide.  Continue torsemide 60 mg po bid at home.   Picture is  still confusing.  High Qp/Qs with step up between SVC and RA on RHC, but TEE and MRA chest did not show ASD or anomalous PV return to explain this, and Qp/Qs by MRA was only 1.2.  Patient had similar discordant findings on workup in past at [facility].  It looks like the RV failure is due to high output HF in the setting of liver disease.   Patient gets all his care at [facility], should followup with cardiologist there in the next 2 wks (he will call).  If he cannot get an appointment, we can see him in HF clinic here.  He can go home today on the meds listed in the PA's note above.   Loralie Champagne 12/16/2020 8:52 AM

## 2020-12-17 DIAGNOSIS — N1832 Chronic kidney disease, stage 3b: Secondary | ICD-10-CM | POA: Diagnosis not present

## 2020-12-17 DIAGNOSIS — K7469 Other cirrhosis of liver: Secondary | ICD-10-CM | POA: Diagnosis not present

## 2020-12-17 DIAGNOSIS — I50810 Right heart failure, unspecified: Secondary | ICD-10-CM | POA: Diagnosis not present

## 2020-12-17 DIAGNOSIS — I5033 Acute on chronic diastolic (congestive) heart failure: Secondary | ICD-10-CM | POA: Diagnosis not present

## 2020-12-17 DIAGNOSIS — E1122 Type 2 diabetes mellitus with diabetic chronic kidney disease: Secondary | ICD-10-CM | POA: Diagnosis not present

## 2020-12-17 DIAGNOSIS — G894 Chronic pain syndrome: Secondary | ICD-10-CM | POA: Diagnosis not present

## 2020-12-17 DIAGNOSIS — H9190 Unspecified hearing loss, unspecified ear: Secondary | ICD-10-CM | POA: Diagnosis not present

## 2020-12-17 DIAGNOSIS — Z944 Liver transplant status: Secondary | ICD-10-CM | POA: Diagnosis not present

## 2020-12-17 DIAGNOSIS — R011 Cardiac murmur, unspecified: Secondary | ICD-10-CM | POA: Diagnosis not present

## 2020-12-17 DIAGNOSIS — Z8505 Personal history of malignant neoplasm of liver: Secondary | ICD-10-CM | POA: Diagnosis not present

## 2020-12-17 DIAGNOSIS — Z794 Long term (current) use of insulin: Secondary | ICD-10-CM | POA: Diagnosis not present

## 2020-12-17 DIAGNOSIS — M5136 Other intervertebral disc degeneration, lumbar region: Secondary | ICD-10-CM | POA: Diagnosis not present

## 2020-12-17 DIAGNOSIS — I130 Hypertensive heart and chronic kidney disease with heart failure and stage 1 through stage 4 chronic kidney disease, or unspecified chronic kidney disease: Secondary | ICD-10-CM | POA: Diagnosis not present

## 2020-12-17 DIAGNOSIS — I272 Pulmonary hypertension, unspecified: Secondary | ICD-10-CM | POA: Diagnosis not present

## 2020-12-17 DIAGNOSIS — I712 Thoracic aortic aneurysm, without rupture, unspecified: Secondary | ICD-10-CM | POA: Diagnosis not present

## 2020-12-22 DIAGNOSIS — I50810 Right heart failure, unspecified: Secondary | ICD-10-CM | POA: Diagnosis not present

## 2020-12-22 DIAGNOSIS — I10 Essential (primary) hypertension: Secondary | ICD-10-CM | POA: Diagnosis not present

## 2020-12-23 DIAGNOSIS — L97909 Non-pressure chronic ulcer of unspecified part of unspecified lower leg with unspecified severity: Secondary | ICD-10-CM | POA: Diagnosis not present

## 2020-12-23 DIAGNOSIS — Z6827 Body mass index (BMI) 27.0-27.9, adult: Secondary | ICD-10-CM | POA: Diagnosis not present

## 2020-12-23 DIAGNOSIS — I509 Heart failure, unspecified: Secondary | ICD-10-CM | POA: Diagnosis not present

## 2020-12-29 DIAGNOSIS — R0602 Shortness of breath: Secondary | ICD-10-CM | POA: Diagnosis not present

## 2020-12-29 DIAGNOSIS — N289 Disorder of kidney and ureter, unspecified: Secondary | ICD-10-CM | POA: Diagnosis not present

## 2021-01-16 DIAGNOSIS — Z794 Long term (current) use of insulin: Secondary | ICD-10-CM | POA: Diagnosis not present

## 2021-01-16 DIAGNOSIS — I5033 Acute on chronic diastolic (congestive) heart failure: Secondary | ICD-10-CM | POA: Diagnosis not present

## 2021-01-16 DIAGNOSIS — N1832 Chronic kidney disease, stage 3b: Secondary | ICD-10-CM | POA: Diagnosis not present

## 2021-01-16 DIAGNOSIS — G894 Chronic pain syndrome: Secondary | ICD-10-CM | POA: Diagnosis not present

## 2021-01-16 DIAGNOSIS — I50810 Right heart failure, unspecified: Secondary | ICD-10-CM | POA: Diagnosis not present

## 2021-01-16 DIAGNOSIS — I712 Thoracic aortic aneurysm, without rupture, unspecified: Secondary | ICD-10-CM | POA: Diagnosis not present

## 2021-01-16 DIAGNOSIS — K7469 Other cirrhosis of liver: Secondary | ICD-10-CM | POA: Diagnosis not present

## 2021-01-16 DIAGNOSIS — Z8505 Personal history of malignant neoplasm of liver: Secondary | ICD-10-CM | POA: Diagnosis not present

## 2021-01-16 DIAGNOSIS — E1122 Type 2 diabetes mellitus with diabetic chronic kidney disease: Secondary | ICD-10-CM | POA: Diagnosis not present

## 2021-01-16 DIAGNOSIS — Z944 Liver transplant status: Secondary | ICD-10-CM | POA: Diagnosis not present

## 2021-01-16 DIAGNOSIS — I130 Hypertensive heart and chronic kidney disease with heart failure and stage 1 through stage 4 chronic kidney disease, or unspecified chronic kidney disease: Secondary | ICD-10-CM | POA: Diagnosis not present

## 2021-01-16 DIAGNOSIS — R011 Cardiac murmur, unspecified: Secondary | ICD-10-CM | POA: Diagnosis not present

## 2021-01-16 DIAGNOSIS — M5136 Other intervertebral disc degeneration, lumbar region: Secondary | ICD-10-CM | POA: Diagnosis not present

## 2021-01-16 DIAGNOSIS — I272 Pulmonary hypertension, unspecified: Secondary | ICD-10-CM | POA: Diagnosis not present

## 2021-01-16 DIAGNOSIS — H9190 Unspecified hearing loss, unspecified ear: Secondary | ICD-10-CM | POA: Diagnosis not present

## 2021-01-19 DIAGNOSIS — I129 Hypertensive chronic kidney disease with stage 1 through stage 4 chronic kidney disease, or unspecified chronic kidney disease: Secondary | ICD-10-CM | POA: Diagnosis not present

## 2021-01-19 DIAGNOSIS — I509 Heart failure, unspecified: Secondary | ICD-10-CM | POA: Diagnosis not present

## 2021-01-19 DIAGNOSIS — E1122 Type 2 diabetes mellitus with diabetic chronic kidney disease: Secondary | ICD-10-CM | POA: Diagnosis not present

## 2021-01-19 DIAGNOSIS — N1832 Chronic kidney disease, stage 3b: Secondary | ICD-10-CM | POA: Diagnosis not present

## 2021-02-01 DIAGNOSIS — I5032 Chronic diastolic (congestive) heart failure: Secondary | ICD-10-CM | POA: Diagnosis not present

## 2021-02-01 DIAGNOSIS — Z944 Liver transplant status: Secondary | ICD-10-CM | POA: Diagnosis not present

## 2021-02-01 DIAGNOSIS — R0602 Shortness of breath: Secondary | ICD-10-CM | POA: Diagnosis not present

## 2021-02-01 DIAGNOSIS — I270 Primary pulmonary hypertension: Secondary | ICD-10-CM | POA: Diagnosis not present

## 2021-02-01 DIAGNOSIS — Z79899 Other long term (current) drug therapy: Secondary | ICD-10-CM | POA: Diagnosis not present

## 2021-02-01 DIAGNOSIS — I5083 High output heart failure: Secondary | ICD-10-CM | POA: Diagnosis not present

## 2021-02-01 DIAGNOSIS — I50810 Right heart failure, unspecified: Secondary | ICD-10-CM | POA: Diagnosis not present

## 2021-02-01 DIAGNOSIS — N183 Chronic kidney disease, stage 3 unspecified: Secondary | ICD-10-CM | POA: Diagnosis not present

## 2021-02-17 ENCOUNTER — Other Ambulatory Visit: Payer: Self-pay | Admitting: Internal Medicine

## 2021-02-18 DIAGNOSIS — K921 Melena: Secondary | ICD-10-CM | POA: Diagnosis not present

## 2021-02-18 DIAGNOSIS — R110 Nausea: Secondary | ICD-10-CM | POA: Diagnosis not present

## 2021-02-18 DIAGNOSIS — Z6826 Body mass index (BMI) 26.0-26.9, adult: Secondary | ICD-10-CM | POA: Diagnosis not present

## 2021-02-25 DIAGNOSIS — K921 Melena: Secondary | ICD-10-CM | POA: Diagnosis not present

## 2021-03-01 DIAGNOSIS — L570 Actinic keratosis: Secondary | ICD-10-CM | POA: Diagnosis not present

## 2021-03-01 DIAGNOSIS — L304 Erythema intertrigo: Secondary | ICD-10-CM | POA: Diagnosis not present

## 2021-03-03 DIAGNOSIS — K921 Melena: Secondary | ICD-10-CM | POA: Diagnosis not present

## 2021-03-03 DIAGNOSIS — K5900 Constipation, unspecified: Secondary | ICD-10-CM | POA: Diagnosis not present

## 2021-03-03 DIAGNOSIS — K219 Gastro-esophageal reflux disease without esophagitis: Secondary | ICD-10-CM | POA: Diagnosis not present

## 2021-03-10 ENCOUNTER — Encounter: Payer: Self-pay | Admitting: Sports Medicine

## 2021-03-10 ENCOUNTER — Other Ambulatory Visit: Payer: Self-pay

## 2021-03-10 ENCOUNTER — Ambulatory Visit: Payer: Medicare Other | Admitting: Sports Medicine

## 2021-03-10 DIAGNOSIS — I739 Peripheral vascular disease, unspecified: Secondary | ICD-10-CM

## 2021-03-10 DIAGNOSIS — E119 Type 2 diabetes mellitus without complications: Secondary | ICD-10-CM

## 2021-03-10 DIAGNOSIS — M79674 Pain in right toe(s): Secondary | ICD-10-CM | POA: Diagnosis not present

## 2021-03-10 DIAGNOSIS — L601 Onycholysis: Secondary | ICD-10-CM | POA: Diagnosis not present

## 2021-03-10 DIAGNOSIS — L603 Nail dystrophy: Secondary | ICD-10-CM | POA: Diagnosis not present

## 2021-03-10 NOTE — Patient Instructions (Signed)
Soak with 2-3 tablespoons of betadine or iodine solution and warm water for 15 mins [place]ress with bandaid as needed and leave open to air as much as possible

## 2021-03-10 NOTE — Progress Notes (Signed)
Subjective: Gregory Tanner is a 66 y.o. male patient returns to office today for follow up evaluation after having Right nail permanent avulsion performed on 11-13-20. Patient has been soaking using Epsom salt and applying topical antibiotic covered with bandaid daily but about 1 month ago noticed a fleck of nail and pulled it and since it has been bleeding and has noticed a drop of puss. Admits he has admitted to hospital for edema and fluid retention. Patient deniesfever/chills/nausea/vomitting/any other related constitutional symptoms at this time.  Patient Active Problem List   Diagnosis Date Noted   Acute respiratory distress 12/05/2020   Ascites    Thrombocytopenia ([place]) 11/23/2020   Iron deficiency anemia 11/23/2020   Acute exacerbation of CHF (congestive heart failure) ([place]) 11/22/2020   Volume overload 01/22/2020   PHT (pulmonary hypertension) ([place]) 01/19/2020   Swelling    Liver disease    Hypertension    Hearing loss    Diabetes mellitus without complication (HCC)    Hypokalemia    Acute on chronic diastolic CHF (congestive heart failure) ([place]) 07/24/2019   Uncontrolled type 2 diabetes mellitus with insulin therapy 06/21/2018   CKD (chronic kidney disease) stage 3, GFR 30-59 ml/min (HCC) 03/27/2017   Chronic diastolic heart failure ([place]) 09/26/2016   Chronic pain disorder 10/28/2015   SOB (shortness of breath) on exertion 10/28/2015   Hypertensive heart disease with heart failure ([place]) 10/26/2015   Benign essential hypertension 10/26/2015   Chronic pain associated with significant psychosocial dysfunction 06/17/2015   Nerve root pain 06/17/2015   Ankylosis of lumbar spine 02/21/2014   Degeneration of intervertebral disc of lumbar region 07/05/2013   Lumbar radiculopathy 10/10/2012   Degenerative disc disease, lumbar 07/26/2012   Encounter for other specified prophylactic measures 10/10/2011   Excess weight 10/10/2011   History of liver transplant ([place]) 07/20/2011   Type  2 diabetes mellitus ([place]) 12/27/2010   Hepatocellular carcinoma ([place]) 12/27/2010   BP (high blood pressure) 12/27/2010    Current Outpatient Medications on File Prior to Visit  Medication Sig Dispense Refill   allopurinol (ZYLOPRIM) 300 MG tablet 1 tablet (300 mg total) once daily     bumetanide (BUMEX) 2 MG tablet Take by mouth.     metolazone (ZAROXOLYN) 5 MG tablet Take 1 tablet 4 days of the week.     aspirin 81 MG EC tablet Take by mouth.     atorvastatin (LIPITOR) 20 MG tablet Take 1 tablet (20 mg total) by mouth daily. 30 tablet 0   Continuous Blood Gluc Receiver (DEXCOM G6 RECEIVER) DEVI Use 1 each as directed     Continuous Blood Gluc Sensor (DEXCOM G6 SENSOR) MISC Use 1 each every 10 (ten) days     Continuous Blood Gluc Transmit (DEXCOM G6 TRANSMITTER) MISC Use 1 each every 3 (three) months     dextromethorphan-guaiFENesin (MUCINEX DM) 30-600 MG 12hr tablet Take 1 tablet by mouth 2 (two) times daily as needed for cough. (Patient taking differently: Take 1 tablet by mouth 4 (four) times daily as needed for cough.) 60 tablet 0   gemfibrozil (LOPID) 600 MG tablet Take 0.5 tablets (300 mg total) by mouth in the morning and at bedtime. 30 tablet 0   hydrALAZINE (APRESOLINE) 25 MG tablet Take 1 tablet (25 mg total) by mouth every morning. 30 tablet 0   insulin lispro (HUMALOG) 100 UNIT/ML KwikPen Inject 20-30 Units into the skin with breakfast, with lunch, and with evening meal.     Multiple Vitamin (MULTIVITAMIN) tablet Take 1  tablet by mouth daily.     mupirocin ointment (BACTROBAN) 2 % SMARTSIG:1 Application Topical 2-3 Times Daily     mycophenolate (CELLCEPT) 250 MG capsule Take 4 capsules (1,000 mg total) by mouth in the morning and at bedtime. 240 capsule 0   omeprazole (PRILOSEC) 20 MG capsule Take 20 mg by mouth daily.     ondansetron (ZOFRAN-ODT) 4 MG disintegrating tablet Take 2 mg by mouth every 6 (six) hours as needed.     potassium chloride SA (KLOR-CON) 20 MEQ tablet Take 2  tablets (40 mEq total) by mouth 2 (two) times daily. 120 tablet 0   spironolactone (ALDACTONE) 25 MG tablet Take 25 mg by mouth daily.     TART CHERRY PO Take 1 tablet by mouth in the morning and at bedtime.     torsemide 60 MG TABS Take 60 mg by mouth 2 (two) times daily. 60 tablet 0   triamcinolone ointment (KENALOG) 0.1 % SMARTSIG:1 Topical Daily     No current facility-administered medications on file prior to visit.    Allergies  Allergen Reactions   Iodinated Contrast Media Hives and Other (See Comments)    Allergy is not to all contrast - but patient is unsure of which particular one his allergy is to. Allergy is not to all contrast - but patient is unsure of which particular one his allergy is to. Contrast dye used before liver transplant cause hives, not sure which dye this was but is able to use others   Jardiance [Empagliflozin] Dermatitis    developed blisters with the medication, blisters  stopped after he discontinued taking it. (About 6 months ago /~06/2020)   Latex Other (See Comments)    Skin peeling   Metformin And Related Nausea And Vomiting   Rosiglitazone Nausea And Vomiting    GI effects and abdominal pain    Objective:  General: Well developed, nourished, in no acute distress, alert and oriented x3   Dermatology: Skin is warm, dry and supple bilateral.  Right hallux nail bed has a granular wound in the nail bed with yellow drainage , (-)Erythema. (+) Edema. (+) bloody drainage present. The remaining nails appear unremarkable at this time history of previous nail removals sites at left hallux and 2nd toes are well healed. There are no open sores, lesions or other signs of infection present.   Vascular: Dorsalis Pedis artery and Posterior Tibial artery pedal pulses are 0/4 bilateral today due to edema. + purple discoloration to legs and feet, Scant Pedal hair growth present. + lymphedema bilateral.    Neruologic: Grossly intact via light touch bilateral.    Musculoskeletal: Tenderness to palpation of the right hallux nail bed.  Muscular strength within normal limits in all groups bilateral.      Assesement and Plan: Problem List Items Addressed This Visit       Endocrine   Diabetes mellitus without complication (HCC)   Relevant Medications   aspirin 81 MG EC tablet   Other Visit Diagnoses     Nail dystrophy    -  Primary   Onycholysis       Toe pain, right       PVD (peripheral vascular disease) (HCC)       Relevant Medications   aspirin 81 MG EC tablet   bumetanide (BUMEX) 2 MG tablet   metolazone (ZAROXOLYN) 5 MG tablet   spironolactone (ALDACTONE) 25 MG tablet       -Examined patient  -Cleansed right hallux medial/lateral nail fold  and gently scrubbed with peroxide and q-tip/curetted away any nonviable tissue to site and then applied a small amount of phenol at the area of previous nail splinter per patient that may have been indicative of a nail root -Discussed plan of care with patient. -Patient to now begin soaking in a weak solution of Epsom salt and warm water. Patient was instructed to soak for 15-20 minutes each day until the toe appears normal and there is no drainage, redness, tenderness, or swelling at the procedure site, and apply  bandaid dressing each day as needed. May leave open to air at night or as much as possible to assist with healing. -Educated patient on long term care after nail surgery. -Patient was instructed to monitor the toe for reoccurrence and signs of infection; Patient advised to return to office or go to ER if toe becomes red, hot or swollen. -Patient is to return in 2 weeks for toe check or sooner if problems arise.  Landis Martins, DPM

## 2021-03-16 DIAGNOSIS — H59032 Cystoid macular edema following cataract surgery, left eye: Secondary | ICD-10-CM | POA: Diagnosis not present

## 2021-03-16 DIAGNOSIS — H5202 Hypermetropia, left eye: Secondary | ICD-10-CM | POA: Diagnosis not present

## 2021-03-16 DIAGNOSIS — H35372 Puckering of macula, left eye: Secondary | ICD-10-CM | POA: Diagnosis not present

## 2021-03-16 DIAGNOSIS — H04121 Dry eye syndrome of right lacrimal gland: Secondary | ICD-10-CM | POA: Diagnosis not present

## 2021-03-22 DIAGNOSIS — D849 Immunodeficiency, unspecified: Secondary | ICD-10-CM | POA: Diagnosis not present

## 2021-03-22 DIAGNOSIS — E871 Hypo-osmolality and hyponatremia: Secondary | ICD-10-CM | POA: Diagnosis not present

## 2021-03-22 DIAGNOSIS — R188 Other ascites: Secondary | ICD-10-CM | POA: Diagnosis not present

## 2021-03-22 DIAGNOSIS — R251 Tremor, unspecified: Secondary | ICD-10-CM | POA: Diagnosis not present

## 2021-03-22 DIAGNOSIS — Z4823 Encounter for aftercare following liver transplant: Secondary | ICD-10-CM | POA: Diagnosis not present

## 2021-03-22 DIAGNOSIS — Z944 Liver transplant status: Secondary | ICD-10-CM | POA: Diagnosis not present

## 2021-03-29 DIAGNOSIS — L98429 Non-pressure chronic ulcer of back with unspecified severity: Secondary | ICD-10-CM | POA: Diagnosis not present

## 2021-03-29 DIAGNOSIS — I071 Rheumatic tricuspid insufficiency: Secondary | ICD-10-CM | POA: Diagnosis not present

## 2021-03-29 DIAGNOSIS — R519 Headache, unspecified: Secondary | ICD-10-CM | POA: Diagnosis not present

## 2021-03-29 DIAGNOSIS — E8779 Other fluid overload: Secondary | ICD-10-CM | POA: Diagnosis not present

## 2021-03-29 DIAGNOSIS — M47812 Spondylosis without myelopathy or radiculopathy, cervical region: Secondary | ICD-10-CM | POA: Diagnosis not present

## 2021-03-29 DIAGNOSIS — Z23 Encounter for immunization: Secondary | ICD-10-CM | POA: Diagnosis not present

## 2021-03-29 DIAGNOSIS — I10 Essential (primary) hypertension: Secondary | ICD-10-CM | POA: Diagnosis not present

## 2021-03-29 DIAGNOSIS — N185 Chronic kidney disease, stage 5: Secondary | ICD-10-CM | POA: Diagnosis not present

## 2021-03-29 DIAGNOSIS — Y92231 Patient bathroom in hospital as the place of occurrence of the external cause: Secondary | ICD-10-CM | POA: Diagnosis not present

## 2021-03-29 DIAGNOSIS — R053 Chronic cough: Secondary | ICD-10-CM | POA: Diagnosis not present

## 2021-03-29 DIAGNOSIS — C220 Liver cell carcinoma: Secondary | ICD-10-CM | POA: Diagnosis not present

## 2021-03-29 DIAGNOSIS — R0989 Other specified symptoms and signs involving the circulatory and respiratory systems: Secondary | ICD-10-CM | POA: Diagnosis not present

## 2021-03-29 DIAGNOSIS — Z452 Encounter for adjustment and management of vascular access device: Secondary | ICD-10-CM | POA: Diagnosis not present

## 2021-03-29 DIAGNOSIS — I132 Hypertensive heart and chronic kidney disease with heart failure and with stage 5 chronic kidney disease, or end stage renal disease: Secondary | ICD-10-CM | POA: Diagnosis not present

## 2021-03-29 DIAGNOSIS — W1839XA Other fall on same level, initial encounter: Secondary | ICD-10-CM | POA: Diagnosis not present

## 2021-03-29 DIAGNOSIS — R270 Ataxia, unspecified: Secondary | ICD-10-CM | POA: Diagnosis not present

## 2021-03-29 DIAGNOSIS — S066XAA Traumatic subarachnoid hemorrhage with loss of consciousness status unknown, initial encounter: Secondary | ICD-10-CM | POA: Diagnosis not present

## 2021-03-29 DIAGNOSIS — I471 Supraventricular tachycardia: Secondary | ICD-10-CM | POA: Diagnosis not present

## 2021-03-29 DIAGNOSIS — I723 Aneurysm of iliac artery: Secondary | ICD-10-CM | POA: Diagnosis not present

## 2021-03-29 DIAGNOSIS — E1122 Type 2 diabetes mellitus with diabetic chronic kidney disease: Secondary | ICD-10-CM | POA: Diagnosis not present

## 2021-03-29 DIAGNOSIS — R188 Other ascites: Secondary | ICD-10-CM | POA: Diagnosis not present

## 2021-03-29 DIAGNOSIS — R0602 Shortness of breath: Secondary | ICD-10-CM | POA: Diagnosis not present

## 2021-03-29 DIAGNOSIS — R778 Other specified abnormalities of plasma proteins: Secondary | ICD-10-CM | POA: Diagnosis not present

## 2021-03-29 DIAGNOSIS — D509 Iron deficiency anemia, unspecified: Secondary | ICD-10-CM | POA: Diagnosis not present

## 2021-03-29 DIAGNOSIS — I50810 Right heart failure, unspecified: Secondary | ICD-10-CM | POA: Diagnosis not present

## 2021-03-29 DIAGNOSIS — S0990XA Unspecified injury of head, initial encounter: Secondary | ICD-10-CM | POA: Diagnosis not present

## 2021-03-29 DIAGNOSIS — E8770 Fluid overload, unspecified: Secondary | ICD-10-CM | POA: Diagnosis not present

## 2021-03-29 DIAGNOSIS — E871 Hypo-osmolality and hyponatremia: Secondary | ICD-10-CM | POA: Diagnosis not present

## 2021-03-29 DIAGNOSIS — Y33XXXA Other specified events, undetermined intent, initial encounter: Secondary | ICD-10-CM | POA: Diagnosis not present

## 2021-03-29 DIAGNOSIS — I5043 Acute on chronic combined systolic (congestive) and diastolic (congestive) heart failure: Secondary | ICD-10-CM | POA: Diagnosis not present

## 2021-03-29 DIAGNOSIS — N179 Acute kidney failure, unspecified: Secondary | ICD-10-CM | POA: Diagnosis not present

## 2021-03-29 DIAGNOSIS — I272 Pulmonary hypertension, unspecified: Secondary | ICD-10-CM | POA: Diagnosis not present

## 2021-03-29 DIAGNOSIS — N1832 Chronic kidney disease, stage 3b: Secondary | ICD-10-CM | POA: Diagnosis not present

## 2021-03-29 DIAGNOSIS — C801 Malignant (primary) neoplasm, unspecified: Secondary | ICD-10-CM | POA: Diagnosis not present

## 2021-03-29 DIAGNOSIS — Z944 Liver transplant status: Secondary | ICD-10-CM | POA: Diagnosis not present

## 2021-03-29 DIAGNOSIS — D696 Thrombocytopenia, unspecified: Secondary | ICD-10-CM | POA: Diagnosis not present

## 2021-03-29 DIAGNOSIS — Z992 Dependence on renal dialysis: Secondary | ICD-10-CM | POA: Diagnosis not present

## 2021-03-29 DIAGNOSIS — I5033 Acute on chronic diastolic (congestive) heart failure: Secondary | ICD-10-CM | POA: Diagnosis not present

## 2021-03-29 DIAGNOSIS — R079 Chest pain, unspecified: Secondary | ICD-10-CM | POA: Diagnosis not present

## 2021-03-29 DIAGNOSIS — M4802 Spinal stenosis, cervical region: Secondary | ICD-10-CM | POA: Diagnosis not present

## 2021-03-29 DIAGNOSIS — N186 End stage renal disease: Secondary | ICD-10-CM | POA: Diagnosis not present

## 2021-04-05 DIAGNOSIS — N179 Acute kidney failure, unspecified: Secondary | ICD-10-CM | POA: Insufficient documentation

## 2021-04-05 DIAGNOSIS — N1832 Chronic kidney disease, stage 3b: Secondary | ICD-10-CM | POA: Insufficient documentation

## 2021-04-05 DIAGNOSIS — N19 Unspecified kidney failure: Secondary | ICD-10-CM | POA: Insufficient documentation

## 2021-04-07 ENCOUNTER — Ambulatory Visit: Payer: Medicare Other | Admitting: Sports Medicine

## 2021-04-21 DIAGNOSIS — I509 Heart failure, unspecified: Secondary | ICD-10-CM | POA: Diagnosis not present

## 2021-04-21 DIAGNOSIS — N1832 Chronic kidney disease, stage 3b: Secondary | ICD-10-CM | POA: Diagnosis not present

## 2021-04-21 DIAGNOSIS — N179 Acute kidney failure, unspecified: Secondary | ICD-10-CM | POA: Diagnosis not present

## 2021-04-21 DIAGNOSIS — E871 Hypo-osmolality and hyponatremia: Secondary | ICD-10-CM | POA: Diagnosis not present

## 2021-04-28 DIAGNOSIS — N179 Acute kidney failure, unspecified: Secondary | ICD-10-CM | POA: Diagnosis not present

## 2021-04-29 DIAGNOSIS — I272 Pulmonary hypertension, unspecified: Secondary | ICD-10-CM | POA: Diagnosis not present

## 2021-04-29 DIAGNOSIS — E8770 Fluid overload, unspecified: Secondary | ICD-10-CM | POA: Diagnosis not present

## 2021-04-29 DIAGNOSIS — I890 Lymphedema, not elsewhere classified: Secondary | ICD-10-CM | POA: Diagnosis not present

## 2021-04-29 DIAGNOSIS — M5416 Radiculopathy, lumbar region: Secondary | ICD-10-CM | POA: Diagnosis not present

## 2021-04-29 DIAGNOSIS — Z9181 History of falling: Secondary | ICD-10-CM | POA: Diagnosis not present

## 2021-04-29 DIAGNOSIS — N186 End stage renal disease: Secondary | ICD-10-CM | POA: Diagnosis not present

## 2021-04-29 DIAGNOSIS — I50810 Right heart failure, unspecified: Secondary | ICD-10-CM | POA: Diagnosis not present

## 2021-04-29 DIAGNOSIS — M5136 Other intervertebral disc degeneration, lumbar region: Secondary | ICD-10-CM | POA: Diagnosis not present

## 2021-04-29 DIAGNOSIS — Z944 Liver transplant status: Secondary | ICD-10-CM | POA: Diagnosis not present

## 2021-04-29 DIAGNOSIS — I130 Hypertensive heart and chronic kidney disease with heart failure and stage 1 through stage 4 chronic kidney disease, or unspecified chronic kidney disease: Secondary | ICD-10-CM | POA: Diagnosis not present

## 2021-04-29 DIAGNOSIS — I071 Rheumatic tricuspid insufficiency: Secondary | ICD-10-CM | POA: Diagnosis not present

## 2021-04-29 DIAGNOSIS — Z992 Dependence on renal dialysis: Secondary | ICD-10-CM | POA: Diagnosis not present

## 2021-04-29 DIAGNOSIS — I5033 Acute on chronic diastolic (congestive) heart failure: Secondary | ICD-10-CM | POA: Diagnosis not present

## 2021-04-29 DIAGNOSIS — Z794 Long term (current) use of insulin: Secondary | ICD-10-CM | POA: Diagnosis not present

## 2021-04-29 DIAGNOSIS — E1122 Type 2 diabetes mellitus with diabetic chronic kidney disease: Secondary | ICD-10-CM | POA: Diagnosis not present

## 2021-05-03 DIAGNOSIS — E8770 Fluid overload, unspecified: Secondary | ICD-10-CM | POA: Diagnosis not present

## 2021-05-03 DIAGNOSIS — E1122 Type 2 diabetes mellitus with diabetic chronic kidney disease: Secondary | ICD-10-CM | POA: Diagnosis not present

## 2021-05-03 DIAGNOSIS — Z992 Dependence on renal dialysis: Secondary | ICD-10-CM | POA: Diagnosis not present

## 2021-05-03 DIAGNOSIS — Z9181 History of falling: Secondary | ICD-10-CM | POA: Diagnosis not present

## 2021-05-03 DIAGNOSIS — I130 Hypertensive heart and chronic kidney disease with heart failure and stage 1 through stage 4 chronic kidney disease, or unspecified chronic kidney disease: Secondary | ICD-10-CM | POA: Diagnosis not present

## 2021-05-03 DIAGNOSIS — I50810 Right heart failure, unspecified: Secondary | ICD-10-CM | POA: Diagnosis not present

## 2021-05-03 DIAGNOSIS — I272 Pulmonary hypertension, unspecified: Secondary | ICD-10-CM | POA: Diagnosis not present

## 2021-05-03 DIAGNOSIS — Z794 Long term (current) use of insulin: Secondary | ICD-10-CM | POA: Diagnosis not present

## 2021-05-03 DIAGNOSIS — I071 Rheumatic tricuspid insufficiency: Secondary | ICD-10-CM | POA: Diagnosis not present

## 2021-05-03 DIAGNOSIS — N179 Acute kidney failure, unspecified: Secondary | ICD-10-CM | POA: Diagnosis not present

## 2021-05-03 DIAGNOSIS — N186 End stage renal disease: Secondary | ICD-10-CM | POA: Diagnosis not present

## 2021-05-03 DIAGNOSIS — Z944 Liver transplant status: Secondary | ICD-10-CM | POA: Diagnosis not present

## 2021-05-03 DIAGNOSIS — I890 Lymphedema, not elsewhere classified: Secondary | ICD-10-CM | POA: Diagnosis not present

## 2021-05-03 DIAGNOSIS — I5033 Acute on chronic diastolic (congestive) heart failure: Secondary | ICD-10-CM | POA: Diagnosis not present

## 2021-05-03 DIAGNOSIS — M5136 Other intervertebral disc degeneration, lumbar region: Secondary | ICD-10-CM | POA: Diagnosis not present

## 2021-05-03 DIAGNOSIS — M5416 Radiculopathy, lumbar region: Secondary | ICD-10-CM | POA: Diagnosis not present

## 2021-05-04 DIAGNOSIS — N186 End stage renal disease: Secondary | ICD-10-CM | POA: Diagnosis not present

## 2021-05-04 DIAGNOSIS — Z794 Long term (current) use of insulin: Secondary | ICD-10-CM | POA: Diagnosis not present

## 2021-05-04 DIAGNOSIS — E1122 Type 2 diabetes mellitus with diabetic chronic kidney disease: Secondary | ICD-10-CM | POA: Diagnosis not present

## 2021-05-04 DIAGNOSIS — E119 Type 2 diabetes mellitus without complications: Secondary | ICD-10-CM | POA: Diagnosis not present

## 2021-05-05 DIAGNOSIS — I272 Pulmonary hypertension, unspecified: Secondary | ICD-10-CM | POA: Diagnosis not present

## 2021-05-05 DIAGNOSIS — E8770 Fluid overload, unspecified: Secondary | ICD-10-CM | POA: Diagnosis not present

## 2021-05-05 DIAGNOSIS — I890 Lymphedema, not elsewhere classified: Secondary | ICD-10-CM | POA: Diagnosis not present

## 2021-05-05 DIAGNOSIS — Z9181 History of falling: Secondary | ICD-10-CM | POA: Diagnosis not present

## 2021-05-05 DIAGNOSIS — Z944 Liver transplant status: Secondary | ICD-10-CM | POA: Diagnosis not present

## 2021-05-05 DIAGNOSIS — E1122 Type 2 diabetes mellitus with diabetic chronic kidney disease: Secondary | ICD-10-CM | POA: Diagnosis not present

## 2021-05-05 DIAGNOSIS — Z992 Dependence on renal dialysis: Secondary | ICD-10-CM | POA: Diagnosis not present

## 2021-05-05 DIAGNOSIS — I5033 Acute on chronic diastolic (congestive) heart failure: Secondary | ICD-10-CM | POA: Diagnosis not present

## 2021-05-05 DIAGNOSIS — I130 Hypertensive heart and chronic kidney disease with heart failure and stage 1 through stage 4 chronic kidney disease, or unspecified chronic kidney disease: Secondary | ICD-10-CM | POA: Diagnosis not present

## 2021-05-05 DIAGNOSIS — I50810 Right heart failure, unspecified: Secondary | ICD-10-CM | POA: Diagnosis not present

## 2021-05-05 DIAGNOSIS — M5136 Other intervertebral disc degeneration, lumbar region: Secondary | ICD-10-CM | POA: Diagnosis not present

## 2021-05-05 DIAGNOSIS — I071 Rheumatic tricuspid insufficiency: Secondary | ICD-10-CM | POA: Diagnosis not present

## 2021-05-05 DIAGNOSIS — Z794 Long term (current) use of insulin: Secondary | ICD-10-CM | POA: Diagnosis not present

## 2021-05-05 DIAGNOSIS — N186 End stage renal disease: Secondary | ICD-10-CM | POA: Diagnosis not present

## 2021-05-05 DIAGNOSIS — M5416 Radiculopathy, lumbar region: Secondary | ICD-10-CM | POA: Diagnosis not present

## 2021-05-07 DIAGNOSIS — N186 End stage renal disease: Secondary | ICD-10-CM | POA: Diagnosis not present

## 2021-05-07 DIAGNOSIS — Z9181 History of falling: Secondary | ICD-10-CM | POA: Diagnosis not present

## 2021-05-07 DIAGNOSIS — Z992 Dependence on renal dialysis: Secondary | ICD-10-CM | POA: Diagnosis not present

## 2021-05-07 DIAGNOSIS — I130 Hypertensive heart and chronic kidney disease with heart failure and stage 1 through stage 4 chronic kidney disease, or unspecified chronic kidney disease: Secondary | ICD-10-CM | POA: Diagnosis not present

## 2021-05-07 DIAGNOSIS — M5416 Radiculopathy, lumbar region: Secondary | ICD-10-CM | POA: Diagnosis not present

## 2021-05-07 DIAGNOSIS — I5033 Acute on chronic diastolic (congestive) heart failure: Secondary | ICD-10-CM | POA: Diagnosis not present

## 2021-05-07 DIAGNOSIS — E1122 Type 2 diabetes mellitus with diabetic chronic kidney disease: Secondary | ICD-10-CM | POA: Diagnosis not present

## 2021-05-07 DIAGNOSIS — E8770 Fluid overload, unspecified: Secondary | ICD-10-CM | POA: Diagnosis not present

## 2021-05-07 DIAGNOSIS — M5136 Other intervertebral disc degeneration, lumbar region: Secondary | ICD-10-CM | POA: Diagnosis not present

## 2021-05-07 DIAGNOSIS — I890 Lymphedema, not elsewhere classified: Secondary | ICD-10-CM | POA: Diagnosis not present

## 2021-05-07 DIAGNOSIS — I50810 Right heart failure, unspecified: Secondary | ICD-10-CM | POA: Diagnosis not present

## 2021-05-07 DIAGNOSIS — Z944 Liver transplant status: Secondary | ICD-10-CM | POA: Diagnosis not present

## 2021-05-07 DIAGNOSIS — Z794 Long term (current) use of insulin: Secondary | ICD-10-CM | POA: Diagnosis not present

## 2021-05-07 DIAGNOSIS — I071 Rheumatic tricuspid insufficiency: Secondary | ICD-10-CM | POA: Diagnosis not present

## 2021-05-07 DIAGNOSIS — I272 Pulmonary hypertension, unspecified: Secondary | ICD-10-CM | POA: Diagnosis not present

## 2021-05-10 DIAGNOSIS — I50810 Right heart failure, unspecified: Secondary | ICD-10-CM | POA: Diagnosis not present

## 2021-05-10 DIAGNOSIS — I5033 Acute on chronic diastolic (congestive) heart failure: Secondary | ICD-10-CM | POA: Diagnosis not present

## 2021-05-10 DIAGNOSIS — I272 Pulmonary hypertension, unspecified: Secondary | ICD-10-CM | POA: Diagnosis not present

## 2021-05-10 DIAGNOSIS — Z992 Dependence on renal dialysis: Secondary | ICD-10-CM | POA: Diagnosis not present

## 2021-05-10 DIAGNOSIS — N186 End stage renal disease: Secondary | ICD-10-CM | POA: Diagnosis not present

## 2021-05-10 DIAGNOSIS — M5416 Radiculopathy, lumbar region: Secondary | ICD-10-CM | POA: Diagnosis not present

## 2021-05-10 DIAGNOSIS — M5136 Other intervertebral disc degeneration, lumbar region: Secondary | ICD-10-CM | POA: Diagnosis not present

## 2021-05-10 DIAGNOSIS — I130 Hypertensive heart and chronic kidney disease with heart failure and stage 1 through stage 4 chronic kidney disease, or unspecified chronic kidney disease: Secondary | ICD-10-CM | POA: Diagnosis not present

## 2021-05-10 DIAGNOSIS — I890 Lymphedema, not elsewhere classified: Secondary | ICD-10-CM | POA: Diagnosis not present

## 2021-05-10 DIAGNOSIS — E1122 Type 2 diabetes mellitus with diabetic chronic kidney disease: Secondary | ICD-10-CM | POA: Diagnosis not present

## 2021-05-10 DIAGNOSIS — I071 Rheumatic tricuspid insufficiency: Secondary | ICD-10-CM | POA: Diagnosis not present

## 2021-05-10 DIAGNOSIS — Z9181 History of falling: Secondary | ICD-10-CM | POA: Diagnosis not present

## 2021-05-10 DIAGNOSIS — Z794 Long term (current) use of insulin: Secondary | ICD-10-CM | POA: Diagnosis not present

## 2021-05-10 DIAGNOSIS — E8770 Fluid overload, unspecified: Secondary | ICD-10-CM | POA: Diagnosis not present

## 2021-05-10 DIAGNOSIS — Z944 Liver transplant status: Secondary | ICD-10-CM | POA: Diagnosis not present

## 2021-05-11 DIAGNOSIS — E785 Hyperlipidemia, unspecified: Secondary | ICD-10-CM | POA: Diagnosis not present

## 2021-05-11 DIAGNOSIS — E1122 Type 2 diabetes mellitus with diabetic chronic kidney disease: Secondary | ICD-10-CM | POA: Diagnosis not present

## 2021-05-11 DIAGNOSIS — Z992 Dependence on renal dialysis: Secondary | ICD-10-CM | POA: Diagnosis not present

## 2021-05-11 DIAGNOSIS — I132 Hypertensive heart and chronic kidney disease with heart failure and with stage 5 chronic kidney disease, or end stage renal disease: Secondary | ICD-10-CM | POA: Diagnosis not present

## 2021-05-11 DIAGNOSIS — N186 End stage renal disease: Secondary | ICD-10-CM | POA: Diagnosis not present

## 2021-05-11 DIAGNOSIS — Z794 Long term (current) use of insulin: Secondary | ICD-10-CM | POA: Diagnosis not present

## 2021-05-11 DIAGNOSIS — I10 Essential (primary) hypertension: Secondary | ICD-10-CM | POA: Diagnosis not present

## 2021-05-11 DIAGNOSIS — I50810 Right heart failure, unspecified: Secondary | ICD-10-CM | POA: Diagnosis not present

## 2021-05-12 DIAGNOSIS — E1122 Type 2 diabetes mellitus with diabetic chronic kidney disease: Secondary | ICD-10-CM | POA: Diagnosis not present

## 2021-05-12 DIAGNOSIS — N186 End stage renal disease: Secondary | ICD-10-CM | POA: Diagnosis not present

## 2021-05-12 DIAGNOSIS — I5033 Acute on chronic diastolic (congestive) heart failure: Secondary | ICD-10-CM | POA: Diagnosis not present

## 2021-05-12 DIAGNOSIS — Z944 Liver transplant status: Secondary | ICD-10-CM | POA: Diagnosis not present

## 2021-05-12 DIAGNOSIS — E8770 Fluid overload, unspecified: Secondary | ICD-10-CM | POA: Diagnosis not present

## 2021-05-12 DIAGNOSIS — Z992 Dependence on renal dialysis: Secondary | ICD-10-CM | POA: Diagnosis not present

## 2021-05-12 DIAGNOSIS — I130 Hypertensive heart and chronic kidney disease with heart failure and stage 1 through stage 4 chronic kidney disease, or unspecified chronic kidney disease: Secondary | ICD-10-CM | POA: Diagnosis not present

## 2021-05-12 DIAGNOSIS — I50810 Right heart failure, unspecified: Secondary | ICD-10-CM | POA: Diagnosis not present

## 2021-05-12 DIAGNOSIS — I272 Pulmonary hypertension, unspecified: Secondary | ICD-10-CM | POA: Diagnosis not present

## 2021-05-12 DIAGNOSIS — Z794 Long term (current) use of insulin: Secondary | ICD-10-CM | POA: Diagnosis not present

## 2021-05-12 DIAGNOSIS — I890 Lymphedema, not elsewhere classified: Secondary | ICD-10-CM | POA: Diagnosis not present

## 2021-05-12 DIAGNOSIS — M5136 Other intervertebral disc degeneration, lumbar region: Secondary | ICD-10-CM | POA: Diagnosis not present

## 2021-05-12 DIAGNOSIS — Z9181 History of falling: Secondary | ICD-10-CM | POA: Diagnosis not present

## 2021-05-12 DIAGNOSIS — M5416 Radiculopathy, lumbar region: Secondary | ICD-10-CM | POA: Diagnosis not present

## 2021-05-12 DIAGNOSIS — I071 Rheumatic tricuspid insufficiency: Secondary | ICD-10-CM | POA: Diagnosis not present

## 2021-05-14 DIAGNOSIS — N179 Acute kidney failure, unspecified: Secondary | ICD-10-CM | POA: Diagnosis not present

## 2021-05-14 DIAGNOSIS — I071 Rheumatic tricuspid insufficiency: Secondary | ICD-10-CM | POA: Diagnosis not present

## 2021-05-14 DIAGNOSIS — E8770 Fluid overload, unspecified: Secondary | ICD-10-CM | POA: Diagnosis not present

## 2021-05-14 DIAGNOSIS — I130 Hypertensive heart and chronic kidney disease with heart failure and stage 1 through stage 4 chronic kidney disease, or unspecified chronic kidney disease: Secondary | ICD-10-CM | POA: Diagnosis not present

## 2021-05-14 DIAGNOSIS — Z944 Liver transplant status: Secondary | ICD-10-CM | POA: Diagnosis not present

## 2021-05-14 DIAGNOSIS — N186 End stage renal disease: Secondary | ICD-10-CM | POA: Diagnosis not present

## 2021-05-14 DIAGNOSIS — E1122 Type 2 diabetes mellitus with diabetic chronic kidney disease: Secondary | ICD-10-CM | POA: Diagnosis not present

## 2021-05-14 DIAGNOSIS — I5033 Acute on chronic diastolic (congestive) heart failure: Secondary | ICD-10-CM | POA: Diagnosis not present

## 2021-05-14 DIAGNOSIS — M5136 Other intervertebral disc degeneration, lumbar region: Secondary | ICD-10-CM | POA: Diagnosis not present

## 2021-05-14 DIAGNOSIS — Z794 Long term (current) use of insulin: Secondary | ICD-10-CM | POA: Diagnosis not present

## 2021-05-14 DIAGNOSIS — M5416 Radiculopathy, lumbar region: Secondary | ICD-10-CM | POA: Diagnosis not present

## 2021-05-14 DIAGNOSIS — Z9181 History of falling: Secondary | ICD-10-CM | POA: Diagnosis not present

## 2021-05-14 DIAGNOSIS — I890 Lymphedema, not elsewhere classified: Secondary | ICD-10-CM | POA: Diagnosis not present

## 2021-05-14 DIAGNOSIS — Z992 Dependence on renal dialysis: Secondary | ICD-10-CM | POA: Diagnosis not present

## 2021-05-14 DIAGNOSIS — I272 Pulmonary hypertension, unspecified: Secondary | ICD-10-CM | POA: Diagnosis not present

## 2021-05-14 DIAGNOSIS — I50810 Right heart failure, unspecified: Secondary | ICD-10-CM | POA: Diagnosis not present

## 2021-05-17 ENCOUNTER — Other Ambulatory Visit (HOSPITAL_COMMUNITY): Payer: Self-pay | Admitting: Internal Medicine

## 2021-05-17 ENCOUNTER — Other Ambulatory Visit: Payer: Self-pay | Admitting: Internal Medicine

## 2021-05-17 DIAGNOSIS — N186 End stage renal disease: Secondary | ICD-10-CM | POA: Diagnosis not present

## 2021-05-17 DIAGNOSIS — I272 Pulmonary hypertension, unspecified: Secondary | ICD-10-CM | POA: Diagnosis not present

## 2021-05-17 DIAGNOSIS — I5033 Acute on chronic diastolic (congestive) heart failure: Secondary | ICD-10-CM | POA: Diagnosis not present

## 2021-05-17 DIAGNOSIS — R188 Other ascites: Secondary | ICD-10-CM

## 2021-05-17 DIAGNOSIS — Z992 Dependence on renal dialysis: Secondary | ICD-10-CM | POA: Diagnosis not present

## 2021-05-17 DIAGNOSIS — I50810 Right heart failure, unspecified: Secondary | ICD-10-CM | POA: Diagnosis not present

## 2021-05-17 DIAGNOSIS — M5416 Radiculopathy, lumbar region: Secondary | ICD-10-CM | POA: Diagnosis not present

## 2021-05-17 DIAGNOSIS — Z9181 History of falling: Secondary | ICD-10-CM | POA: Diagnosis not present

## 2021-05-17 DIAGNOSIS — E1122 Type 2 diabetes mellitus with diabetic chronic kidney disease: Secondary | ICD-10-CM | POA: Diagnosis not present

## 2021-05-17 DIAGNOSIS — Z794 Long term (current) use of insulin: Secondary | ICD-10-CM | POA: Diagnosis not present

## 2021-05-17 DIAGNOSIS — I890 Lymphedema, not elsewhere classified: Secondary | ICD-10-CM | POA: Diagnosis not present

## 2021-05-17 DIAGNOSIS — Z944 Liver transplant status: Secondary | ICD-10-CM | POA: Diagnosis not present

## 2021-05-17 DIAGNOSIS — E8770 Fluid overload, unspecified: Secondary | ICD-10-CM | POA: Diagnosis not present

## 2021-05-17 DIAGNOSIS — I071 Rheumatic tricuspid insufficiency: Secondary | ICD-10-CM | POA: Diagnosis not present

## 2021-05-17 DIAGNOSIS — M5136 Other intervertebral disc degeneration, lumbar region: Secondary | ICD-10-CM | POA: Diagnosis not present

## 2021-05-17 DIAGNOSIS — I130 Hypertensive heart and chronic kidney disease with heart failure and stage 1 through stage 4 chronic kidney disease, or unspecified chronic kidney disease: Secondary | ICD-10-CM | POA: Diagnosis not present

## 2021-05-19 DIAGNOSIS — I272 Pulmonary hypertension, unspecified: Secondary | ICD-10-CM | POA: Diagnosis not present

## 2021-05-19 DIAGNOSIS — E1122 Type 2 diabetes mellitus with diabetic chronic kidney disease: Secondary | ICD-10-CM | POA: Diagnosis not present

## 2021-05-19 DIAGNOSIS — I071 Rheumatic tricuspid insufficiency: Secondary | ICD-10-CM | POA: Diagnosis not present

## 2021-05-19 DIAGNOSIS — Z992 Dependence on renal dialysis: Secondary | ICD-10-CM | POA: Diagnosis not present

## 2021-05-19 DIAGNOSIS — N186 End stage renal disease: Secondary | ICD-10-CM | POA: Diagnosis not present

## 2021-05-19 DIAGNOSIS — I5033 Acute on chronic diastolic (congestive) heart failure: Secondary | ICD-10-CM | POA: Diagnosis not present

## 2021-05-19 DIAGNOSIS — I130 Hypertensive heart and chronic kidney disease with heart failure and stage 1 through stage 4 chronic kidney disease, or unspecified chronic kidney disease: Secondary | ICD-10-CM | POA: Diagnosis not present

## 2021-05-19 DIAGNOSIS — Z9181 History of falling: Secondary | ICD-10-CM | POA: Diagnosis not present

## 2021-05-19 DIAGNOSIS — Z794 Long term (current) use of insulin: Secondary | ICD-10-CM | POA: Diagnosis not present

## 2021-05-19 DIAGNOSIS — E8770 Fluid overload, unspecified: Secondary | ICD-10-CM | POA: Diagnosis not present

## 2021-05-19 DIAGNOSIS — Z944 Liver transplant status: Secondary | ICD-10-CM | POA: Diagnosis not present

## 2021-05-19 DIAGNOSIS — M5416 Radiculopathy, lumbar region: Secondary | ICD-10-CM | POA: Diagnosis not present

## 2021-05-19 DIAGNOSIS — M5136 Other intervertebral disc degeneration, lumbar region: Secondary | ICD-10-CM | POA: Diagnosis not present

## 2021-05-19 DIAGNOSIS — I50810 Right heart failure, unspecified: Secondary | ICD-10-CM | POA: Diagnosis not present

## 2021-05-19 DIAGNOSIS — I890 Lymphedema, not elsewhere classified: Secondary | ICD-10-CM | POA: Diagnosis not present

## 2021-05-20 ENCOUNTER — Ambulatory Visit (HOSPITAL_COMMUNITY)
Admission: RE | Admit: 2021-05-20 | Discharge: 2021-05-20 | Disposition: A | Discharge status: Home / Self Care | Payer: Medicare Other | Source: Ambulatory Visit | Attending: Internal Medicine | Admitting: Internal Medicine

## 2021-05-20 DIAGNOSIS — R188 Other ascites: Secondary | ICD-10-CM | POA: Diagnosis not present

## 2021-05-20 DIAGNOSIS — Z944 Liver transplant status: Secondary | ICD-10-CM | POA: Diagnosis not present

## 2021-05-20 DIAGNOSIS — Z23 Encounter for immunization: Secondary | ICD-10-CM | POA: Diagnosis not present

## 2021-05-20 HISTORY — PX: IR PARACENTESIS: IMG2679

## 2021-05-20 MED ORDER — LIDOCAINE HCL 1 % IJ SOLN
INTRAMUSCULAR | Status: AC
  Administered 2021-05-20: 10 mL
  Filled 2021-05-20: qty 20

## 2021-05-20 NOTE — Procedures (Signed)
PROCEDURE SUMMARY: ? ?Successful US guided therapeutic paracentesis from RLQ.  ?Yielded 3.2 L of clear, rust-colored fluid.  ?No immediate complications.  ?Pt tolerated well.  ? ?Specimen not sent for labs. ? ?EBL < 1 mL ? ?Tyson Alias, AGNP ?05/20/2021 ?2:08 PM ? ? ?

## 2021-05-21 DIAGNOSIS — D509 Iron deficiency anemia, unspecified: Secondary | ICD-10-CM | POA: Diagnosis not present

## 2021-05-21 DIAGNOSIS — I50810 Right heart failure, unspecified: Secondary | ICD-10-CM | POA: Diagnosis not present

## 2021-05-21 DIAGNOSIS — I272 Pulmonary hypertension, unspecified: Secondary | ICD-10-CM | POA: Diagnosis not present

## 2021-05-21 DIAGNOSIS — Z9181 History of falling: Secondary | ICD-10-CM | POA: Diagnosis not present

## 2021-05-21 DIAGNOSIS — N186 End stage renal disease: Secondary | ICD-10-CM | POA: Diagnosis not present

## 2021-05-21 DIAGNOSIS — N2581 Secondary hyperparathyroidism of renal origin: Secondary | ICD-10-CM | POA: Diagnosis not present

## 2021-05-21 DIAGNOSIS — Z944 Liver transplant status: Secondary | ICD-10-CM | POA: Diagnosis not present

## 2021-05-21 DIAGNOSIS — I890 Lymphedema, not elsewhere classified: Secondary | ICD-10-CM | POA: Diagnosis not present

## 2021-05-21 DIAGNOSIS — E1122 Type 2 diabetes mellitus with diabetic chronic kidney disease: Secondary | ICD-10-CM | POA: Diagnosis not present

## 2021-05-21 DIAGNOSIS — M5136 Other intervertebral disc degeneration, lumbar region: Secondary | ICD-10-CM | POA: Diagnosis not present

## 2021-05-21 DIAGNOSIS — I5033 Acute on chronic diastolic (congestive) heart failure: Secondary | ICD-10-CM | POA: Diagnosis not present

## 2021-05-21 DIAGNOSIS — I071 Rheumatic tricuspid insufficiency: Secondary | ICD-10-CM | POA: Diagnosis not present

## 2021-05-21 DIAGNOSIS — Z992 Dependence on renal dialysis: Secondary | ICD-10-CM | POA: Diagnosis not present

## 2021-05-21 DIAGNOSIS — Z794 Long term (current) use of insulin: Secondary | ICD-10-CM | POA: Diagnosis not present

## 2021-05-21 DIAGNOSIS — I130 Hypertensive heart and chronic kidney disease with heart failure and stage 1 through stage 4 chronic kidney disease, or unspecified chronic kidney disease: Secondary | ICD-10-CM | POA: Diagnosis not present

## 2021-05-21 DIAGNOSIS — M5416 Radiculopathy, lumbar region: Secondary | ICD-10-CM | POA: Diagnosis not present

## 2021-05-21 DIAGNOSIS — E8770 Fluid overload, unspecified: Secondary | ICD-10-CM | POA: Diagnosis not present

## 2021-05-21 DIAGNOSIS — E876 Hypokalemia: Secondary | ICD-10-CM | POA: Diagnosis not present

## 2021-05-24 DIAGNOSIS — D509 Iron deficiency anemia, unspecified: Secondary | ICD-10-CM | POA: Diagnosis not present

## 2021-05-24 DIAGNOSIS — N2581 Secondary hyperparathyroidism of renal origin: Secondary | ICD-10-CM | POA: Diagnosis not present

## 2021-05-24 DIAGNOSIS — Z9181 History of falling: Secondary | ICD-10-CM | POA: Diagnosis not present

## 2021-05-24 DIAGNOSIS — E1122 Type 2 diabetes mellitus with diabetic chronic kidney disease: Secondary | ICD-10-CM | POA: Diagnosis not present

## 2021-05-24 DIAGNOSIS — Z794 Long term (current) use of insulin: Secondary | ICD-10-CM | POA: Diagnosis not present

## 2021-05-24 DIAGNOSIS — I272 Pulmonary hypertension, unspecified: Secondary | ICD-10-CM | POA: Diagnosis not present

## 2021-05-24 DIAGNOSIS — Z944 Liver transplant status: Secondary | ICD-10-CM | POA: Diagnosis not present

## 2021-05-24 DIAGNOSIS — I071 Rheumatic tricuspid insufficiency: Secondary | ICD-10-CM | POA: Diagnosis not present

## 2021-05-24 DIAGNOSIS — E876 Hypokalemia: Secondary | ICD-10-CM | POA: Diagnosis not present

## 2021-05-24 DIAGNOSIS — N186 End stage renal disease: Secondary | ICD-10-CM | POA: Diagnosis not present

## 2021-05-24 DIAGNOSIS — M5136 Other intervertebral disc degeneration, lumbar region: Secondary | ICD-10-CM | POA: Diagnosis not present

## 2021-05-24 DIAGNOSIS — I50810 Right heart failure, unspecified: Secondary | ICD-10-CM | POA: Diagnosis not present

## 2021-05-24 DIAGNOSIS — I890 Lymphedema, not elsewhere classified: Secondary | ICD-10-CM | POA: Diagnosis not present

## 2021-05-24 DIAGNOSIS — M5416 Radiculopathy, lumbar region: Secondary | ICD-10-CM | POA: Diagnosis not present

## 2021-05-24 DIAGNOSIS — I130 Hypertensive heart and chronic kidney disease with heart failure and stage 1 through stage 4 chronic kidney disease, or unspecified chronic kidney disease: Secondary | ICD-10-CM | POA: Diagnosis not present

## 2021-05-24 DIAGNOSIS — I5033 Acute on chronic diastolic (congestive) heart failure: Secondary | ICD-10-CM | POA: Diagnosis not present

## 2021-05-24 DIAGNOSIS — E8770 Fluid overload, unspecified: Secondary | ICD-10-CM | POA: Diagnosis not present

## 2021-05-24 DIAGNOSIS — Z992 Dependence on renal dialysis: Secondary | ICD-10-CM | POA: Diagnosis not present

## 2021-05-26 DIAGNOSIS — I272 Pulmonary hypertension, unspecified: Secondary | ICD-10-CM | POA: Diagnosis not present

## 2021-05-26 DIAGNOSIS — E8770 Fluid overload, unspecified: Secondary | ICD-10-CM | POA: Diagnosis not present

## 2021-05-26 DIAGNOSIS — D509 Iron deficiency anemia, unspecified: Secondary | ICD-10-CM | POA: Diagnosis not present

## 2021-05-26 DIAGNOSIS — M5416 Radiculopathy, lumbar region: Secondary | ICD-10-CM | POA: Diagnosis not present

## 2021-05-26 DIAGNOSIS — I130 Hypertensive heart and chronic kidney disease with heart failure and stage 1 through stage 4 chronic kidney disease, or unspecified chronic kidney disease: Secondary | ICD-10-CM | POA: Diagnosis not present

## 2021-05-26 DIAGNOSIS — I50810 Right heart failure, unspecified: Secondary | ICD-10-CM | POA: Diagnosis not present

## 2021-05-26 DIAGNOSIS — Z992 Dependence on renal dialysis: Secondary | ICD-10-CM | POA: Diagnosis not present

## 2021-05-26 DIAGNOSIS — I071 Rheumatic tricuspid insufficiency: Secondary | ICD-10-CM | POA: Diagnosis not present

## 2021-05-26 DIAGNOSIS — E1122 Type 2 diabetes mellitus with diabetic chronic kidney disease: Secondary | ICD-10-CM | POA: Diagnosis not present

## 2021-05-26 DIAGNOSIS — N2581 Secondary hyperparathyroidism of renal origin: Secondary | ICD-10-CM | POA: Diagnosis not present

## 2021-05-26 DIAGNOSIS — I5033 Acute on chronic diastolic (congestive) heart failure: Secondary | ICD-10-CM | POA: Diagnosis not present

## 2021-05-26 DIAGNOSIS — E876 Hypokalemia: Secondary | ICD-10-CM | POA: Diagnosis not present

## 2021-05-26 DIAGNOSIS — N186 End stage renal disease: Secondary | ICD-10-CM | POA: Diagnosis not present

## 2021-05-26 DIAGNOSIS — Z9181 History of falling: Secondary | ICD-10-CM | POA: Diagnosis not present

## 2021-05-26 DIAGNOSIS — I890 Lymphedema, not elsewhere classified: Secondary | ICD-10-CM | POA: Diagnosis not present

## 2021-05-26 DIAGNOSIS — Z944 Liver transplant status: Secondary | ICD-10-CM | POA: Diagnosis not present

## 2021-05-26 DIAGNOSIS — Z794 Long term (current) use of insulin: Secondary | ICD-10-CM | POA: Diagnosis not present

## 2021-05-26 DIAGNOSIS — M5136 Other intervertebral disc degeneration, lumbar region: Secondary | ICD-10-CM | POA: Diagnosis not present

## 2021-05-28 DIAGNOSIS — D509 Iron deficiency anemia, unspecified: Secondary | ICD-10-CM | POA: Diagnosis not present

## 2021-05-28 DIAGNOSIS — Z992 Dependence on renal dialysis: Secondary | ICD-10-CM | POA: Diagnosis not present

## 2021-05-28 DIAGNOSIS — E1122 Type 2 diabetes mellitus with diabetic chronic kidney disease: Secondary | ICD-10-CM | POA: Diagnosis not present

## 2021-05-28 DIAGNOSIS — N186 End stage renal disease: Secondary | ICD-10-CM | POA: Diagnosis not present

## 2021-05-28 DIAGNOSIS — N2581 Secondary hyperparathyroidism of renal origin: Secondary | ICD-10-CM | POA: Diagnosis not present

## 2021-05-28 DIAGNOSIS — E876 Hypokalemia: Secondary | ICD-10-CM | POA: Diagnosis not present

## 2021-05-31 DIAGNOSIS — D509 Iron deficiency anemia, unspecified: Secondary | ICD-10-CM | POA: Diagnosis not present

## 2021-05-31 DIAGNOSIS — E876 Hypokalemia: Secondary | ICD-10-CM | POA: Diagnosis not present

## 2021-05-31 DIAGNOSIS — S81801D Unspecified open wound, right lower leg, subsequent encounter: Secondary | ICD-10-CM | POA: Diagnosis not present

## 2021-05-31 DIAGNOSIS — Z992 Dependence on renal dialysis: Secondary | ICD-10-CM | POA: Diagnosis not present

## 2021-05-31 DIAGNOSIS — N2581 Secondary hyperparathyroidism of renal origin: Secondary | ICD-10-CM | POA: Diagnosis not present

## 2021-05-31 DIAGNOSIS — E1122 Type 2 diabetes mellitus with diabetic chronic kidney disease: Secondary | ICD-10-CM | POA: Diagnosis not present

## 2021-05-31 DIAGNOSIS — N186 End stage renal disease: Secondary | ICD-10-CM | POA: Diagnosis not present

## 2021-05-31 DIAGNOSIS — I890 Lymphedema, not elsewhere classified: Secondary | ICD-10-CM | POA: Diagnosis not present

## 2021-05-31 DIAGNOSIS — E8770 Fluid overload, unspecified: Secondary | ICD-10-CM | POA: Diagnosis not present

## 2021-06-01 ENCOUNTER — Inpatient Hospital Stay (HOSPITAL_COMMUNITY)
Admission: EM | Admit: 2021-06-01 | Discharge: 2021-06-03 | DRG: 291 | Disposition: A | Discharge status: Home Health | Payer: Medicare Other | Attending: Internal Medicine | Admitting: Internal Medicine

## 2021-06-01 ENCOUNTER — Emergency Department (HOSPITAL_COMMUNITY): Payer: Medicare Other

## 2021-06-01 ENCOUNTER — Encounter (HOSPITAL_COMMUNITY): Payer: Self-pay

## 2021-06-01 ENCOUNTER — Inpatient Hospital Stay (HOSPITAL_COMMUNITY): Payer: Medicare Other

## 2021-06-01 ENCOUNTER — Other Ambulatory Visit: Payer: Self-pay

## 2021-06-01 DIAGNOSIS — K7469 Other cirrhosis of liver: Secondary | ICD-10-CM | POA: Diagnosis present

## 2021-06-01 DIAGNOSIS — Z8505 Personal history of malignant neoplasm of liver: Secondary | ICD-10-CM | POA: Diagnosis not present

## 2021-06-01 DIAGNOSIS — R188 Other ascites: Secondary | ICD-10-CM | POA: Diagnosis present

## 2021-06-01 DIAGNOSIS — R0600 Dyspnea, unspecified: Secondary | ICD-10-CM | POA: Diagnosis not present

## 2021-06-01 DIAGNOSIS — I2729 Other secondary pulmonary hypertension: Secondary | ICD-10-CM | POA: Diagnosis not present

## 2021-06-01 DIAGNOSIS — R0602 Shortness of breath: Secondary | ICD-10-CM | POA: Diagnosis not present

## 2021-06-01 DIAGNOSIS — N186 End stage renal disease: Secondary | ICD-10-CM | POA: Diagnosis not present

## 2021-06-01 DIAGNOSIS — Z794 Long term (current) use of insulin: Secondary | ICD-10-CM | POA: Diagnosis not present

## 2021-06-01 DIAGNOSIS — I959 Hypotension, unspecified: Secondary | ICD-10-CM | POA: Diagnosis present

## 2021-06-01 DIAGNOSIS — Z944 Liver transplant status: Secondary | ICD-10-CM | POA: Diagnosis not present

## 2021-06-01 DIAGNOSIS — I5082 Biventricular heart failure: Secondary | ICD-10-CM | POA: Diagnosis present

## 2021-06-01 DIAGNOSIS — I499 Cardiac arrhythmia, unspecified: Secondary | ICD-10-CM | POA: Diagnosis not present

## 2021-06-01 DIAGNOSIS — Z79899 Other long term (current) drug therapy: Secondary | ICD-10-CM | POA: Diagnosis not present

## 2021-06-01 DIAGNOSIS — Z8249 Family history of ischemic heart disease and other diseases of the circulatory system: Secondary | ICD-10-CM

## 2021-06-01 DIAGNOSIS — E785 Hyperlipidemia, unspecified: Secondary | ICD-10-CM | POA: Diagnosis present

## 2021-06-01 DIAGNOSIS — I5043 Acute on chronic combined systolic (congestive) and diastolic (congestive) heart failure: Secondary | ICD-10-CM | POA: Diagnosis present

## 2021-06-01 DIAGNOSIS — Z7982 Long term (current) use of aspirin: Secondary | ICD-10-CM

## 2021-06-01 DIAGNOSIS — D631 Anemia in chronic kidney disease: Secondary | ICD-10-CM | POA: Diagnosis not present

## 2021-06-01 DIAGNOSIS — J9601 Acute respiratory failure with hypoxia: Secondary | ICD-10-CM | POA: Diagnosis not present

## 2021-06-01 DIAGNOSIS — E119 Type 2 diabetes mellitus without complications: Secondary | ICD-10-CM

## 2021-06-01 DIAGNOSIS — I4510 Unspecified right bundle-branch block: Secondary | ICD-10-CM | POA: Diagnosis not present

## 2021-06-01 DIAGNOSIS — Z809 Family history of malignant neoplasm, unspecified: Secondary | ICD-10-CM | POA: Diagnosis not present

## 2021-06-01 DIAGNOSIS — E8770 Fluid overload, unspecified: Secondary | ICD-10-CM | POA: Diagnosis not present

## 2021-06-01 DIAGNOSIS — N289 Disorder of kidney and ureter, unspecified: Secondary | ICD-10-CM | POA: Diagnosis not present

## 2021-06-01 DIAGNOSIS — E1122 Type 2 diabetes mellitus with diabetic chronic kidney disease: Secondary | ICD-10-CM | POA: Diagnosis not present

## 2021-06-01 DIAGNOSIS — K7581 Nonalcoholic steatohepatitis (NASH): Secondary | ICD-10-CM | POA: Diagnosis present

## 2021-06-01 DIAGNOSIS — Z992 Dependence on renal dialysis: Secondary | ICD-10-CM | POA: Diagnosis not present

## 2021-06-01 DIAGNOSIS — R609 Edema, unspecified: Secondary | ICD-10-CM | POA: Diagnosis not present

## 2021-06-01 DIAGNOSIS — E1129 Type 2 diabetes mellitus with other diabetic kidney complication: Secondary | ICD-10-CM | POA: Diagnosis not present

## 2021-06-01 DIAGNOSIS — I132 Hypertensive heart and chronic kidney disease with heart failure and with stage 5 chronic kidney disease, or end stage renal disease: Principal | ICD-10-CM | POA: Diagnosis present

## 2021-06-01 DIAGNOSIS — Z833 Family history of diabetes mellitus: Secondary | ICD-10-CM

## 2021-06-01 DIAGNOSIS — Z7189 Other specified counseling: Secondary | ICD-10-CM | POA: Diagnosis not present

## 2021-06-01 DIAGNOSIS — I110 Hypertensive heart disease with heart failure: Secondary | ICD-10-CM | POA: Diagnosis present

## 2021-06-01 DIAGNOSIS — Z20822 Contact with and (suspected) exposure to covid-19: Secondary | ICD-10-CM | POA: Diagnosis not present

## 2021-06-01 DIAGNOSIS — I5032 Chronic diastolic (congestive) heart failure: Secondary | ICD-10-CM | POA: Diagnosis not present

## 2021-06-01 DIAGNOSIS — Z818 Family history of other mental and behavioral disorders: Secondary | ICD-10-CM

## 2021-06-01 DIAGNOSIS — I10 Essential (primary) hypertension: Secondary | ICD-10-CM | POA: Diagnosis present

## 2021-06-01 DIAGNOSIS — I517 Cardiomegaly: Secondary | ICD-10-CM | POA: Diagnosis not present

## 2021-06-01 HISTORY — PX: IR PARACENTESIS: IMG2679

## 2021-06-01 LAB — CBC WITH DIFFERENTIAL/PLATELET
Abs Immature Granulocytes: 0.1 10*3/uL — ABNORMAL HIGH (ref 0.00–0.07)
Basophils Absolute: 0 10*3/uL (ref 0.0–0.1)
Basophils Relative: 1 %
Eosinophils Absolute: 0.1 10*3/uL (ref 0.0–0.5)
Eosinophils Relative: 1 %
HCT: 34.4 % — ABNORMAL LOW (ref 39.0–52.0)
Hemoglobin: 10.8 g/dL — ABNORMAL LOW (ref 13.0–17.0)
Immature Granulocytes: 2 %
Lymphocytes Relative: 12 %
Lymphs Abs: 0.7 10*3/uL (ref 0.7–4.0)
MCH: 30.6 pg (ref 26.0–34.0)
MCHC: 31.4 g/dL (ref 30.0–36.0)
MCV: 97.5 fL (ref 80.0–100.0)
Monocytes Absolute: 0.7 10*3/uL (ref 0.1–1.0)
Monocytes Relative: 11 %
Neutro Abs: 4.3 10*3/uL (ref 1.7–7.7)
Neutrophils Relative %: 73 %
Platelets: 158 10*3/uL (ref 150–400)
RBC: 3.53 MIL/uL — ABNORMAL LOW (ref 4.22–5.81)
RDW: 17.8 % — ABNORMAL HIGH (ref 11.5–15.5)
WBC: 5.9 10*3/uL (ref 4.0–10.5)
nRBC: 0 % (ref 0.0–0.2)

## 2021-06-01 LAB — COMPREHENSIVE METABOLIC PANEL
ALT: 16 U/L (ref 0–44)
AST: 20 U/L (ref 15–41)
Albumin: 4 g/dL (ref 3.5–5.0)
Alkaline Phosphatase: 114 U/L (ref 38–126)
Anion gap: 13 (ref 5–15)
BUN: 36 mg/dL — ABNORMAL HIGH (ref 8–23)
CO2: 24 mmol/L (ref 22–32)
Calcium: 8.6 mg/dL — ABNORMAL LOW (ref 8.9–10.3)
Chloride: 94 mmol/L — ABNORMAL LOW (ref 98–111)
Creatinine, Ser: 3.33 mg/dL — ABNORMAL HIGH (ref 0.61–1.24)
GFR, Estimated: 19 mL/min — ABNORMAL LOW (ref >60)
Glucose, Bld: 102 mg/dL — ABNORMAL HIGH (ref 70–99)
Potassium: 4.4 mmol/L (ref 3.5–5.1)
Sodium: 131 mmol/L — ABNORMAL LOW (ref 135–145)
Total Bilirubin: 0.9 mg/dL (ref 0.3–1.2)
Total Protein: 6.6 g/dL (ref 6.5–8.1)

## 2021-06-01 LAB — HEPATITIS B SURFACE ANTIBODY,QUALITATIVE: Hep B S Ab: NONREACTIVE

## 2021-06-01 LAB — BRAIN NATRIURETIC PEPTIDE: B Natriuretic Peptide: 709.6 pg/mL — ABNORMAL HIGH (ref 0.0–100.0)

## 2021-06-01 LAB — RESP PANEL BY RT-PCR (FLU A&B, COVID) ARPGX2
Influenza A by PCR: NEGATIVE
Influenza B by PCR: NEGATIVE
SARS Coronavirus 2 by RT PCR: NEGATIVE

## 2021-06-01 LAB — TROPONIN I (HIGH SENSITIVITY)
Troponin I (High Sensitivity): 38 ng/L — ABNORMAL HIGH (ref <18)
Troponin I (High Sensitivity): 40 ng/L — ABNORMAL HIGH (ref <18)

## 2021-06-01 LAB — GLUCOSE, CAPILLARY: Glucose-Capillary: 200 mg/dL — ABNORMAL HIGH (ref 70–99)

## 2021-06-01 LAB — HEPATITIS B SURFACE ANTIGEN: Hepatitis B Surface Ag: NONREACTIVE

## 2021-06-01 MED ORDER — CAMPHOR-MENTHOL 0.5-0.5 % EX LOTN: 1.0000 "application " | TOPICAL_LOTION | Freq: Three times a day (TID) | CUTANEOUS | Status: DC | PRN

## 2021-06-01 MED ORDER — INSULIN ASPART 100 UNIT/ML IJ SOLN
0.0000 [IU] | Freq: Three times a day (TID) | INTRAMUSCULAR | Status: DC
  Administered 2021-06-02 (×2): 1 [IU] via SUBCUTANEOUS

## 2021-06-01 MED ORDER — LIDOCAINE HCL 1 % IJ SOLN
INTRAMUSCULAR | Status: AC
  Filled 2021-06-01: qty 20

## 2021-06-01 MED ORDER — HYDROXYZINE HCL 25 MG PO TABS: 25.0000 mg | ORAL_TABLET | Freq: Three times a day (TID) | ORAL | Status: DC | PRN

## 2021-06-01 MED ORDER — ACETAMINOPHEN 325 MG PO TABS
650.0000 mg | ORAL_TABLET | Freq: Four times a day (QID) | ORAL | Status: DC | PRN
Start: 2021-06-01 — End: 2021-06-03

## 2021-06-01 MED ORDER — ONDANSETRON HCL 4 MG/2ML IJ SOLN: 4.0000 mg | Freq: Four times a day (QID) | INTRAMUSCULAR | Status: DC | PRN

## 2021-06-01 MED ORDER — MIDODRINE HCL 5 MG PO TABS
10.0000 mg | ORAL_TABLET | Freq: Every day | ORAL | Status: DC | PRN
  Administered 2021-06-01: 10 mg via ORAL
  Filled 2021-06-01: qty 2

## 2021-06-01 MED ORDER — ACETAMINOPHEN 650 MG RE SUPP: 650.0000 mg | Freq: Four times a day (QID) | RECTAL | Status: DC | PRN

## 2021-06-01 MED ORDER — ZOLPIDEM TARTRATE 5 MG PO TABS: 5.0000 mg | ORAL_TABLET | Freq: Every evening | ORAL | Status: DC | PRN

## 2021-06-01 MED ORDER — ASPIRIN EC 81 MG PO TBEC
81.0000 mg | DELAYED_RELEASE_TABLET | Freq: Every day | ORAL | Status: DC
  Administered 2021-06-03: 81 mg via ORAL
  Filled 2021-06-01 (×2): qty 1

## 2021-06-01 MED ORDER — HEPARIN SODIUM (PORCINE) 1000 UNIT/ML IJ SOLN
INTRAMUSCULAR | Status: AC
  Administered 2021-06-01: 1000 [IU]
  Filled 2021-06-01: qty 4

## 2021-06-01 MED ORDER — PANTOPRAZOLE SODIUM 40 MG PO TBEC: 40.0000 mg | DELAYED_RELEASE_TABLET | Freq: Every day | ORAL | Status: DC

## 2021-06-01 MED ORDER — HEPARIN SODIUM (PORCINE) 5000 UNIT/ML IJ SOLN
5000.0000 [IU] | Freq: Three times a day (TID) | INTRAMUSCULAR | Status: DC
  Filled 2021-06-01: qty 1

## 2021-06-01 MED ORDER — SODIUM CHLORIDE 0.9% FLUSH
3.0000 mL | Freq: Two times a day (BID) | INTRAVENOUS | Status: DC
  Administered 2021-06-01: 3 mL via INTRAVENOUS

## 2021-06-01 MED ORDER — ATORVASTATIN CALCIUM 10 MG PO TABS
20.0000 mg | ORAL_TABLET | Freq: Every day | ORAL | Status: DC
Start: 2021-06-01 — End: 2021-06-01

## 2021-06-01 MED ORDER — ALBUMIN HUMAN 25 % IV SOLN
INTRAVENOUS | Status: AC
Start: 2021-06-01 — End: 2021-06-01
  Filled 2021-06-01: qty 100

## 2021-06-01 MED ORDER — HYDRALAZINE HCL 20 MG/ML IJ SOLN: 5.0000 mg | INTRAMUSCULAR | Status: DC | PRN

## 2021-06-01 MED ORDER — GEMFIBROZIL 600 MG PO TABS: 300.0000 mg | ORAL_TABLET | Freq: Two times a day (BID) | ORAL | Status: DC

## 2021-06-01 MED ORDER — ALBUMIN HUMAN 25 % IV SOLN
25.0000 g | Freq: Once | INTRAVENOUS | Status: AC
  Administered 2021-06-01: 25 g via INTRAVENOUS

## 2021-06-01 MED ORDER — SODIUM CHLORIDE 0.9 % IV SOLN
62.5000 mg | INTRAVENOUS | Status: DC
  Administered 2021-06-02: 62.5 mg via INTRAVENOUS
  Filled 2021-06-01: qty 5

## 2021-06-01 MED ORDER — SPIRONOLACTONE 25 MG PO TABS: 25.0000 mg | ORAL_TABLET | Freq: Every day | ORAL | Status: DC

## 2021-06-01 MED ORDER — ALLOPURINOL 300 MG PO TABS
150.0000 mg | ORAL_TABLET | Freq: Every day | ORAL | Status: DC
  Administered 2021-06-02 – 2021-06-03 (×2): 150 mg via ORAL
  Filled 2021-06-01 (×2): qty 1

## 2021-06-01 MED ORDER — SORBITOL 70 % SOLN
30.0000 mL | Status: DC | PRN
  Filled 2021-06-01: qty 30

## 2021-06-01 MED ORDER — DOCUSATE SODIUM 283 MG RE ENEM
1.0000 | ENEMA | RECTAL | Status: DC | PRN
  Filled 2021-06-01: qty 1

## 2021-06-01 MED ORDER — NEPRO/CARBSTEADY PO LIQD
237.0000 mL | Freq: Three times a day (TID) | ORAL | Status: DC | PRN
  Filled 2021-06-01: qty 237

## 2021-06-01 MED ORDER — ONDANSETRON HCL 4 MG PO TABS: 4.0000 mg | ORAL_TABLET | Freq: Four times a day (QID) | ORAL | Status: DC | PRN

## 2021-06-01 MED ORDER — CALCIUM CARBONATE ANTACID 1250 MG/5ML PO SUSP
500.0000 mg | Freq: Four times a day (QID) | ORAL | Status: DC | PRN
  Filled 2021-06-01: qty 5

## 2021-06-01 MED ORDER — LIDOCAINE HCL 1 % IJ SOLN
INTRAMUSCULAR | Status: DC | PRN
Start: 2021-06-01 — End: 2021-06-03
  Administered 2021-06-01: 10 mL via INTRADERMAL

## 2021-06-01 MED ORDER — MYCOPHENOLATE MOFETIL 250 MG PO CAPS
1000.0000 mg | ORAL_CAPSULE | Freq: Two times a day (BID) | ORAL | Status: DC
  Administered 2021-06-01 – 2021-06-03 (×4): 1000 mg via ORAL
  Filled 2021-06-01 (×6): qty 4

## 2021-06-01 MED ORDER — CHLORHEXIDINE GLUCONATE CLOTH 2 % EX PADS
6.0000 | MEDICATED_PAD | Freq: Every day | CUTANEOUS | Status: DC
  Administered 2021-06-01 – 2021-06-03 (×3): 6 via TOPICAL

## 2021-06-01 NOTE — ED Notes (Signed)
BiPap removed, pt placed on oygen 2 liters via nasal canula ?

## 2021-06-01 NOTE — ED Notes (Signed)
Got patient back in the bed from the bathroom patient is back on the monitor with family at bedsideand call bell in reach  ?

## 2021-06-01 NOTE — ED Provider Notes (Signed)
?[place] DEPT ?[facility] Emergency Department ?Provider Note ?MRN:  [id number]  ?Arrival date & time: 06/01/21    ? ?Chief Complaint   ?Shortness of Breath ?  ?History of Present Illness   ?Gregory Tanner is a 66 y.o. year-old male presents to the ED with chief complaint of shortness of breath.  Patient has history of end-stage renal disease and is on dialysis.  He was last dialyzed yesterday.  He also has history of liver transplant and had therapeutic paracentesis a couple of weeks ago.  He reports acutely worsening shortness of breath 2 days ago.  Denies any fevers or chills.  Denies abdominal pain.  He states that when he was dialyzed yesterday it helped a little.. ? ?History provided by patient. ?Chronically ill ? ?Review of Systems  ?Pertinent review of systems noted in HPI.  ? ? ?Physical Exam  ? ?Vitals:  ? 06/01/21 0400 06/01/21 0515  ?BP: 110/64   ?Pulse: (!) 58 (!) 105  ?Resp:  (!) 25  ?Temp:    ?SpO2: 95% 100%  ?  ?CONSTITUTIONAL:  chronically ill-appearing, NAD ?NEURO:  Alert and oriented x 3, CN 3-12 grossly intact ?EYES:  eyes equal and reactive ?ENT/NECK:  Supple, no stridor  ?CARDIO:  mildly tachy, regular rhythm, appears well-perfused  ?PULM:  Moderate respiratory distress, increased WOB, accessory muscle use, diffuse wheezes ?GI/GU:  Distended, likely ascites, non-tender ?MSK/SPINE:  No gross deformities, no edema, moves all extremities  ?SKIN:  no rash, atraumatic ? ? ?*Additional and/or pertinent findings included in MDM below ? ?Diagnostic and Interventional Summary  ? ? EKG Interpretation ? ?Date/Time:    ?Ventricular Rate:    ?PR Interval:    ?QRS Duration:   ?QT Interval:    ?QTC Calculation:   ?R Axis:     ?Text Interpretation:   ?  ? ?  ? ?Labs Reviewed  ?CBC WITH DIFFERENTIAL/PLATELET - Abnormal; Notable for the following components:  ?    Result Value  ? RBC 3.53 (*)   ? Hemoglobin 10.8 (*)   ? HCT 34.4 (*)   ? RDW 17.8 (*)   ? Abs Immature Granulocytes 0.10 (*)   ? All  other components within normal limits  ?COMPREHENSIVE METABOLIC PANEL - Abnormal; Notable for the following components:  ? Sodium 131 (*)   ? Chloride 94 (*)   ? Glucose, Bld 102 (*)   ? BUN 36 (*)   ? Creatinine, Ser 3.33 (*)   ? Calcium 8.6 (*)   ? GFR, Estimated 19 (*)   ? All other components within normal limits  ?BRAIN NATRIURETIC PEPTIDE - Abnormal; Notable for the following components:  ? B Natriuretic Peptide 709.6 (*)   ? All other components within normal limits  ?TROPONIN I (HIGH SENSITIVITY) - Abnormal; Notable for the following components:  ? Troponin I (High Sensitivity) 38 (*)   ? All other components within normal limits  ?RESP PANEL BY RT-PCR (FLU A&B, COVID) [id number]  ?TROPONIN I (HIGH SENSITIVITY)  ?  ?DG Chest 2 View  ?Final Result  ?  ?  ?Medications - No data to display  ? ?Procedures  /  Critical Care ?.Critical Care ?Performed by: Montine Circle, PA-C ?Authorized by: Montine Circle, PA-C  ? ?Critical care provider statement:  ?  Critical care time (minutes):  50 ?  Critical care was necessary to treat or prevent imminent or life-threatening deterioration of the following conditions:  Respiratory failure ?  Critical care was time spent personally by  me on the following activities:  Development of treatment plan with patient or surrogate, discussions with consultants, evaluation of patient's response to treatment, examination of patient, ordering and review of laboratory studies, ordering and review of radiographic studies, ordering and performing treatments and interventions, pulse oximetry, re-evaluation of patient's condition and review of old charts ? ?ED Course and Medical Decision Making  ?I have reviewed the triage vital signs, the nursing notes, and pertinent available records from the EMR. ? ?Complexity of Problems Addressed: ?High Complexity: Acute illness/injury posing a threat to life or bodily function, requiring emergent diagnostic workup, evaluation, and treatment as  below. ?Comorbidities affecting this illness/injury include: ?End-stage renal disease on dialysis, prior liver transplant ?Social Determinants Affecting Care: ? No clinically significant social determinants affecting this chief complaint.. ? ? ?ED Course: ?After considering the following differential, volume overload, pneumonia, COVID, flu, ACS, PE, I ordered labs and chest x-ray. ?I personally interpreted the labs which are notable for normal potassium. ?I visualized the chest x-ray which is notable for pulmonary edema/vascular congestion and agree with the radiologist interpretation.. ? ?  ? ?Consultants: ?I discussed the case with Hospitalist, Dr. Marlowe Sax, who is appreciated for admitting. ? ?Treatment and Plan: ?Patient will need admission for shortness of breath.  I suspect this is primarily from his abdominal ascites and he will likely need therapeutic paracentesis.  His chest x-ray does have some pulmonary edema/vascular congestion.  He is currently being treated with BiPAP and appears improved on this. ? ?Patient's exam and diagnostic results are concerning for respiratory failure.  Feel that patient will need admission to the hospital for further treatment and evaluation. ? ?Patient seen by and discussed with attending physician, Dr. Leonette Monarch, who agrees with plan for admission. ? ?Final Clinical Impressions(s) / ED Diagnoses  ? ?  ICD-10-CM   ?1. SOB (shortness of breath)  R06.02   ?  ?  ?ED Discharge Orders   ? ? None  ? ?  ?  ? ? ?Discharge Instructions Discussed with and Provided to Patient:  ? ?Discharge Instructions   ?None ?  ? ?  ?Montine Circle, PA-C ?06/01/21 7425 ? ?  ?Fatima Blank, MD ?06/01/21 [phone] ? ?

## 2021-06-01 NOTE — ED Notes (Signed)
Pt insisting that we take BiPap off. MD and RT notified.  ?

## 2021-06-01 NOTE — Consult Note (Signed)
Renal Service ?Consult Note ?[facility] ? ?[name] ?06/01/2021 ?[name], MD ?Requesting Physician: Dr. Lorin Mercy ? ?Reason for Consult: ESRD pt w/ SOB ?HPI: The patient is a 66 y.o. year-old w/ hx of HTN, chronic pain, ESRD on HD,  combined diast CHF, DM and NASH cirrhosis s/p liver transplant presented to ED w/ c/o SOB. Had HD yesterday. SOB x 2 days. Has paracentesis 2 wks ago, hx liver transplant. Pt was placed on bipap and is improving. Asked to see for ESRD.   ? ?Pt seen in HD unit. Pt on HD for about 2 months. He sometimes has to take a 4th day (Sat) in the week if he is overloaded. He "doesn't see how anybody could make it" on 32 oz of fluids per day.  Pt states he had liver Tx (cryptgenic liver failure) around 2013. Didn't have ascites pre-transplant but did develop ascites afterwards.  He was getting a LVP (large vol paracentesis) about once a year but since starting HD it is more like once per month.   ? ?He had a LVP today for 3 L.  He wears wraps on his lower legs for chronic edema. Denies any fevers.  ? ?BP's are low in HD, don't know if this is chronic issue or not. States he gets most of his Care at [facility] (cardiology, transplant).  ? ? ?ROS - denies CP, no joint pain, no HA, no blurry vision, no rash, no diarrhea, no nausea/ vomiting, no dysuria, no difficulty voiding ? ? ?Past Medical History  ?Past Medical History:  ?Diagnosis Date  ? Ankylosis of lumbar spine 02/21/2014  ? Benign essential hypertension 10/26/2015  ? Chronic diastolic heart failure ([place]) 09/26/2016  ? Chronic pain associated with significant psychosocial dysfunction 06/17/2015  ? Degeneration of intervertebral disc of lumbar region 07/05/2013  ? ESRD (end stage renal disease) on dialysis ([place]) 03/27/2017  ? Hearing loss   ? Hepatocellular carcinoma ([place]) 12/27/2010  ? Overview:  Embolized 6/12   ? History of liver transplant ([place]) 07/20/2011  ? Overview:  06/07/2011 for cryptogenic cirrhosis and HCC   ?  Uncontrolled type 2 diabetes mellitus with insulin therapy 06/21/2018  ? ?Past Surgical History  ?Past Surgical History:  ?Procedure Laterality Date  ? BACK SURGERY    ? BAKER CYST SURGERY    ? RIGHT LEG  ? BUBBLE STUDY  12/14/2020  ? Procedure: BUBBLE STUDY;  Surgeon: [name], MD;  Location: [facility] ENDOSCOPY;  Service: Cardiovascular;;  ? EYE SURGERY Left   ? HERNIA REPAIR    ? IR PARACENTESIS  11/30/2020  ? IR PARACENTESIS  05/20/2021  ? LIVER SURGERY    ? IMPLANT  ? LIVER TRANSPLANT    ? RIGHT HEART CATH N/A 12/11/2020  ? Procedure: RIGHT HEART CATH;  Surgeon: [name], MD;  Location: [facility] CV LAB;  Service: Cardiovascular;  Laterality: N/A;  ? TEE WITHOUT CARDIOVERSION N/A 12/14/2020  ? Procedure: TRANSESOPHAGEAL ECHOCARDIOGRAM (TEE);  Surgeon: [name], MD;  Location: [facility] ENDOSCOPY;  Service: Cardiovascular;  Laterality: N/A;  ? [place]    ? TONSILLECTOMY    ? ?Family History  ?Family History  ?Problem Relation Age of Onset  ? Cancer Mother   ? Diabetes Father   ? Heart attack Father   ? Schizophrenia Father   ? Hypertension Brother   ? ?Social History  reports that he has never smoked. He has never used smokeless tobacco. He reports current alcohol use. He reports  that he does not currently use drugs. ?Allergies  ?Allergies  ?Allergen Reactions  ? Iodinated Contrast Media Hives and Other (See Comments)  ?  Allergy is not to all contrast - but patient is unsure of which particular one his allergy is to. ?Allergy is not to all contrast - but patient is unsure of which particular one his allergy is to. ?Contrast dye used before liver transplant cause hives, not sure which dye this was but is able to use others  ? Jardiance [Empagliflozin] Dermatitis  ?  developed blisters with the medication, blisters  stopped after he discontinued taking it. (About 6 months ago /~06/2020)  ? Latex Other (See Comments)  ?  Skin peeling  ? Metformin And Related Nausea And Vomiting  ? Rosiglitazone  Nausea And Vomiting  ?  GI effects and abdominal pain  ? ?Home medications ?Prior to Admission medications   ?Medication Sig Start Date End Date Taking? Authorizing Provider  ?allopurinol (ZYLOPRIM) 300 MG tablet 1 tablet (300 mg total) once daily 05/07/18   [provider]  ?aspirin 81 MG EC tablet Take by mouth.    [provider]  ?atorvastatin (LIPITOR) 20 MG tablet Take 1 tablet (20 mg total) by mouth daily. 12/17/20   Atway, Rayann N, DO  ?bumetanide (BUMEX) 2 MG tablet Take by mouth. 12/22/20 12/22/21  [provider]  ?Continuous Blood Gluc Receiver (DEXCOM G6 RECEIVER) DEVI Use 1 each as directed 09/24/20   [provider]  ?Continuous Blood Gluc Sensor (DEXCOM G6 SENSOR) MISC Use 1 each every 10 (ten) days 09/24/20   [provider]  ?Continuous Blood Gluc Transmit (DEXCOM G6 TRANSMITTER) MISC Use 1 each every 3 (three) months 09/24/20   [provider]  ?dextromethorphan-guaiFENesin (MUCINEX DM) 30-600 MG 12hr tablet Take 1 tablet by mouth 2 (two) times daily as needed for cough. ?Patient taking differently: Take 1 tablet by mouth 4 (four) times daily as needed for cough. 12/03/20   [name], MD  ?gemfibrozil (LOPID) 600 MG tablet Take 0.5 tablets (300 mg total) by mouth in the morning and at bedtime. 12/03/20   [name], MD  ?hydrALAZINE (APRESOLINE) 25 MG tablet Take 1 tablet (25 mg total) by mouth every morning. 12/17/20   Atway, Jeananne Rama, DO  ?insulin lispro (HUMALOG) 100 UNIT/ML KwikPen Inject 20-30 Units into the skin with breakfast, with lunch, and with evening meal. 09/19/20   [provider]  ?metolazone (ZAROXOLYN) 5 MG tablet Take 1 tablet 4 days of the week. 12/25/20   [provider]  ?Multiple Vitamin (MULTIVITAMIN) tablet Take 1 tablet by mouth daily.    [provider]  ?mupirocin ointment (BACTROBAN) 2 % SMARTSIG:1 Application Topical 2-3 Times Daily 03/01/21   [provider]   ?mycophenolate (CELLCEPT) 250 MG capsule Take 4 capsules (1,000 mg total) by mouth in the morning and at bedtime. 12/03/20   [name], MD  ?omeprazole (PRILOSEC) 20 MG capsule Take 20 mg by mouth daily. 03/03/21   [provider]  ?ondansetron (ZOFRAN-ODT) 4 MG disintegrating tablet Take 2 mg by mouth every 6 (six) hours as needed. 02/19/21   [provider]  ?potassium chloride SA (KLOR-CON) 20 MEQ tablet Take 2 tablets (40 mEq total) by mouth 2 (two) times daily. 12/03/20   [name], MD  ?spironolactone (ALDACTONE) 25 MG tablet Take 25 mg by mouth daily. 02/03/21   [provider]  ?TART CHERRY PO Take 1 tablet by mouth in the  morning and at bedtime.    [provider]  ?torsemide 60 MG TABS Take 60 mg by mouth 2 (two) times daily. 12/16/20 01/15/21  Dorethea Clan, DO  ?triamcinolone ointment (KENALOG) 0.1 % SMARTSIG:1 Topical Daily 03/01/21   [provider]  ? ? ? ?Vitals:  ? 06/01/21 0600 06/01/21 0630 06/01/21 0800 06/01/21 0900  ?BP: (!) 100/59 117/69 116/62 100/67  ?Pulse: 98 97 98 98  ?Resp: (!) 26 (!) 24 (!) 25 (!) 22  ?Temp:      ?TempSrc:      ?SpO2: 99% 99% 99% 97%  ? ?Exam ?Gen alert, no distress ?No rash, cyanosis or gangrene ?Sclera anicteric, throat clear  ?+JVD ?Chest scant scattered basilar crackles ?RRR no RG ?Abd soft 2+ ascites ntnd no mass +bs ?GU normal males ?MS no joint effusions or deformity ?Ext diffuse 2+ pretib edema bilat, lower legs wrapped bilat ?Neuro is alert, Ox 3 , nf ?   RIJ TDC in place ? ? ? ? ? Home meds include - xyloprim, asa, lipitor, bumex '2mg'$ , lopid, hydralazine 25 qam, insulin lispro, metolazone 4 d/ wk, cellcept 1 gm bid, prilosec, klor-con, aldactone 25 qd, prns/ vits/ supps ? ? ? ? OP HD: [facility] MWF ? 4h  400/800  84kg  2/2.5 bath  RIJ TDC  ?-  4/18 lab showed hep B Ag neg and hep B Abs were low / not protective ?- last 3 sessions came off 3-4 kg over ?- venofer 50 mg per wk ?- last Hb 10.6 on 4/12 ? ? CXR  4/18 - vasc congestion and patchy early edema ? ?Assessment/ Plan: ?SOB - acute hypoxic resp failure due to pulm edema/ vol overload, possibly pleural effusions. Pt states he is heart problems, but no details. Pt is

## 2021-06-01 NOTE — ED Provider Triage Note (Signed)
Emergency Medicine Provider Triage Evaluation Note ? ?Gregory Tanner , a 66 y.o. male  was evaluated in triage.  Pt complains of dyspnea x 2 days. Constant, worse with laying flat and with activity. Some cough productive of phlegm sputum and left sided chest pain worse with coughing associated w/ dyspnea. Leg swelling & abdominal swelling, reports he had a paracentesis last month. Patient w/ hx of ESRD, MWF, most recently dialysis earlier today. S/p liver transplant on cellcept. ? ?Review of Systems  ?Positive: Dyspnea, chest pain, cough, leg swelling/abdominal swelling ?Negative: Vomiting, hemoptysis, fever ? ?Physical Exam  ?BP 110/64   Pulse (!) 58   Temp 98.2 ?F (36.8 ?C) (Oral)   Resp (!) 22   SpO2 95%  ?Gen:   Awake  ?Resp:  Decreased breath sounds @ the bases, mildly tachypneic.  ?MSK:   Legs are wrapped  ?Other:  Abdominal distension and lower extremity edema.  ? ?Medical Decision Making  ?Medically screening exam initiated at 2:04 AM.  Appropriate orders placed.  Gregory Tanner was informed that the remainder of the evaluation will be completed by another provider, this initial triage assessment does not replace that evaluation, and the importance of remaining in the ED until their evaluation is complete. ? ?Dyspena.  ?Prioritized for rooming.  ?  Amaryllis Dyke, PA-C ?06/01/21 0507 ? ?

## 2021-06-01 NOTE — H&P (Signed)
?History and Physical  ? ? ?Patient: Gregory Tanner DOB: 11-17-1955 ?DOA: 06/01/2021 ?DOS: the patient was seen and examined on 06/01/2021 ?PCP: Ronita Hipps, MD  ?Patient coming from: Home - lives with wife; NOK: Wife, Lio Wehrly, [phone] (he reported that she was coming in soon and did not request that I call her) ? ? ?Chief Complaint: SOB ? ?HPI: Gregory Tanner is a 66 y.o. male with medical history significant of HTN; chronic pain; ESRD on HD; chronic diastolic CHF; DM; and NASH cirrhosis with [place] s/p liver transplant presenting with SOB.  He reports h/o heart problems that are causing repeated volume overload, but he is working with the doctors at [facility] and thinks they will be able to work things out pretty soon.  He was started on HD several months ago but is still volume overloaded because his BP drops too low during HD for them to pull enough fluid off.  He also has had recurrent ascites requiring paracentesis in increasing frequency.  For the last few days he has been more SOB.  He always sleeps sitting up in a recliner but in the last few days has to be completely upright in order to breathe.  He had minimal improvement with HD yesterday and they have been trying to do HD MWFS (4 times a week) to help.  He has not been on home O2 because he doesn't qualify, but they gave him O2 at HD yesterday and it was remarkable; he would pay out of pocket or even consider hospice in order to obtain home O2 - but he is clearly full code. ? ? ? ?ER Course:  Carryover, per Dr. Marlowe Sax: ? ?66 year old ESRD patient on dialysis, history of liver transplant receives therapeutic paracentesis presented to the ED with complaints of shortness of breath.  Chest x-ray showing vascular congestion.  Not hypoxic but placed on BiPAP for comfort.  Nephrology needs to be consulted in the morning.  Has ascites and will need therapeutic paracentesis, consult IR in the morning. ? ? ? ? ?Review of Systems: As  mentioned in the history of present illness. All other systems reviewed and are negative. ?Past Medical History:  ?Diagnosis Date  ? Ankylosis of lumbar spine 02/21/2014  ? Benign essential hypertension 10/26/2015  ? Chronic diastolic heart failure ([place]) 09/26/2016  ? Chronic pain associated with significant psychosocial dysfunction 06/17/2015  ? Degeneration of intervertebral disc of lumbar region 07/05/2013  ? ESRD (end stage renal disease) on dialysis ([place]) 03/27/2017  ? Hearing loss   ? Hepatocellular carcinoma ([place]) 12/27/2010  ? Overview:  Embolized 6/12   ? History of liver transplant ([place]) 07/20/2011  ? Overview:  06/07/2011 for cryptogenic cirrhosis and HCC   ? Uncontrolled type 2 diabetes mellitus with insulin therapy 06/21/2018  ? ?Past Surgical History:  ?Procedure Laterality Date  ? BACK SURGERY    ? BAKER CYST SURGERY    ? RIGHT LEG  ? BUBBLE STUDY  12/14/2020  ? Procedure: BUBBLE STUDY;  Surgeon: [name], MD;  Location: [facility] ENDOSCOPY;  Service: Cardiovascular;;  ? EYE SURGERY Left   ? HERNIA REPAIR    ? IR PARACENTESIS  11/30/2020  ? IR PARACENTESIS  05/20/2021  ? IR PARACENTESIS  06/01/2021  ? LIVER SURGERY    ? IMPLANT  ? LIVER TRANSPLANT    ? RIGHT HEART CATH N/A 12/11/2020  ? Procedure: RIGHT HEART CATH;  Surgeon: [name], MD;  Location: [facility] CV LAB;  Service:  Cardiovascular;  Laterality: N/A;  ? TEE WITHOUT CARDIOVERSION N/A 12/14/2020  ? Procedure: TRANSESOPHAGEAL ECHOCARDIOGRAM (TEE);  Surgeon: [name], MD;  Location: [facility] ENDOSCOPY;  Service: Cardiovascular;  Laterality: N/A;  ? [place]    ? TONSILLECTOMY    ? ?Social History:  reports that he has never smoked. He has never used smokeless tobacco. He reports current alcohol use. He reports that he does not currently use drugs. ? ?Allergies  ?Allergen Reactions  ? Iodinated Contrast Media Hives and Other (See Comments)  ?  Allergy is not to all contrast - but patient is unsure of which particular one his  allergy is to. ?Allergy is not to all contrast - but patient is unsure of which particular one his allergy is to. ?Contrast dye used before liver transplant cause hives, not sure which dye this was but is able to use others  ? Jardiance [Empagliflozin] Dermatitis  ?  developed blisters with the medication, blisters  stopped after he discontinued taking it. (About 6 months ago /~06/2020)  ? Latex Other (See Comments)  ?  Skin peeling  ? Metformin And Related Nausea And Vomiting  ? Rosiglitazone Nausea And Vomiting  ?  GI effects and abdominal pain  ? ? ?Family History  ?Problem Relation Age of Onset  ? Cancer Mother   ? Diabetes Father   ? Heart attack Father   ? Schizophrenia Father   ? Hypertension Brother   ? ? ?Prior to Admission medications   ?Medication Sig Start Date End Date Taking? Authorizing Provider  ?allopurinol (ZYLOPRIM) 300 MG tablet 1 tablet (300 mg total) once daily 05/07/18   [provider]  ?aspirin 81 MG EC tablet Take by mouth.    [provider]  ?atorvastatin (LIPITOR) 20 MG tablet Take 1 tablet (20 mg total) by mouth daily. 12/17/20   Atway, Rayann N, DO  ?bumetanide (BUMEX) 2 MG tablet Take by mouth. 12/22/20 12/22/21  [provider]  ?Continuous Blood Gluc Receiver (DEXCOM G6 RECEIVER) DEVI Use 1 each as directed 09/24/20   [provider]  ?Continuous Blood Gluc Sensor (DEXCOM G6 SENSOR) MISC Use 1 each every 10 (ten) days 09/24/20   [provider]  ?Continuous Blood Gluc Transmit (DEXCOM G6 TRANSMITTER) MISC Use 1 each every 3 (three) months 09/24/20   [provider]  ?dextromethorphan-guaiFENesin (MUCINEX DM) 30-600 MG 12hr tablet Take 1 tablet by mouth 2 (two) times daily as needed for cough. ?Patient taking differently: Take 1 tablet by mouth 4 (four) times daily as needed for cough. 12/03/20   [name], MD  ?gemfibrozil (LOPID) 600 MG tablet Take 0.5 tablets (300 mg total) by mouth in the morning and at bedtime. 12/03/20    [name], MD  ?hydrALAZINE (APRESOLINE) 25 MG tablet Take 1 tablet (25 mg total) by mouth every morning. 12/17/20   Atway, Jeananne Rama, DO  ?insulin lispro (HUMALOG) 100 UNIT/ML KwikPen Inject 20-30 Units into the skin with breakfast, with lunch, and with evening meal. 09/19/20   [provider]  ?metolazone (ZAROXOLYN) 5 MG tablet Take 1 tablet 4 days of the week. 12/25/20   [provider]  ?Multiple Vitamin (MULTIVITAMIN) tablet Take 1 tablet by mouth daily.    [provider]  ?mupirocin ointment (BACTROBAN) 2 % SMARTSIG:1 Application Topical 2-3 Times Daily 03/01/21   [provider]  ?mycophenolate (CELLCEPT) 250 MG capsule Take 4 capsules (1,000 mg total) by mouth in the morning and at bedtime. 12/03/20  [name], MD  ?omeprazole (PRILOSEC) 20 MG capsule Take 20 mg by mouth daily. 03/03/21   [provider]  ?ondansetron (ZOFRAN-ODT) 4 MG disintegrating tablet Take 2 mg by mouth every 6 (six) hours as needed. 02/19/21   [provider]  ?potassium chloride SA (KLOR-CON) 20 MEQ tablet Take 2 tablets (40 mEq total) by mouth 2 (two) times daily. 12/03/20   [name], MD  ?spironolactone (ALDACTONE) 25 MG tablet Take 25 mg by mouth daily. 02/03/21   [provider]  ?TART CHERRY PO Take 1 tablet by mouth in the morning and at bedtime.    [provider]  ?torsemide 60 MG TABS Take 60 mg by mouth 2 (two) times daily. 12/16/20 01/15/21  Dorethea Clan, DO  ?triamcinolone ointment (KENALOG) 0.1 % SMARTSIG:1 Topical Daily 03/01/21   [provider]  ? ? ?Physical Exam: ?Vitals:  ? 06/01/21 1600 06/01/21 1630 06/01/21 1651 06/01/21 1835  ?BP: (!) 84/52 (!) 88/50 96/74 (!) 112/53  ?Pulse: 91 96 64 (!) 101  ?Resp:   18 20  ?Temp:   97.8 ?F (36.6 ?C) 97.8 ?F (36.6 ?C)  ?TempSrc:   Temporal Oral  ?SpO2:   99%   ?Weight:   83 kg   ? ?General:  Appears uncomfortable, sitting stark upright on the side of the bed with his  legs off the bed ?Eyes:   EOMI, normal lids, iris ?ENT:  grossly normal hearing, lips & tongue, mmm ?Neck:  no LAD, masses or thyromegaly; JVD up to his jaw ?Cardiovascular:  RRR, no m/r/g. 3+ LE edema despite Louretta Parma

## 2021-06-01 NOTE — Procedures (Signed)
? ?  I was present at this dialysis session, have reviewed the session itself and made  appropriate changes ?Kelly Splinter MD ?[employer] ?pager [phone]   ?06/01/2021, 3:59 PM ? ? ?

## 2021-06-01 NOTE — Procedures (Signed)
Ultrasound-guided  therapeutic paracentesis performed yielding 3 liters of straw colored fluid.  No immediate complications. EBL is none.       

## 2021-06-01 NOTE — ED Notes (Signed)
Pt transported to dialysis

## 2021-06-01 NOTE — Consult Note (Signed)
? ?                                                                                ?Consultation Note ?Date: 06/01/2021  ? ?Patient Name: Gregory Tanner  ?DOB: 02-07-1956  MRN: [id number]  Age / Sex: 66 y.o., male  ?PCP: Gregory Hipps, MD ?Referring Physician: Shela Leff, MD ? ?Reason for Consultation: Establishing goals of care ? ?HPI/Patient Profile: 65 y.o. male  with past medical history of HTN; chronic pain; ESRD on HD; chronic diastolic CHF; DM; and NASH cirrhosis with [place] s/p liver transplant admitted on 06/01/2021 with SOB. ? ?Patient with 3 admissions in the past 6 months, multiorgan system failure, and now with O2 needs for comfort despite no hypoxia. PMT has been consulted to assist with goals of care conversation.   ? ?Clinical Assessment and Goals of Care: ? ?I have reviewed medical records including EPIC notes, labs and imaging, received report from RN, assessed the patient and then met at the bedside along with patient's wife to discuss diagnosis prognosis, GOC, EOL wishes, disposition and options. ? ?I introduced Palliative Medicine as specialized medical care for people living with serious illness. It focuses on providing relief from the symptoms and stress of a serious illness. The goal is to improve quality of life for both the patient and the family. ? ?We discussed a brief life review of the patient and then focused on their current illness.  ? ?Medical History Review and Understanding: ?Reviewed patient's comorbidities including heart, liver, and kidney failure, history of liver transplant and now respiratory failure. He shares that his [place] physicians have informed him that he is fighting a "slow, retreating battle."  ? ?Social History: ?Patient is from [place] and lives at home with his wife. He spends his time at home watching TV and cooking when he can. He is not religious. ? ?Functional and Nutritional State: ?Patient is able to walk to the bathroom and around the house but is  too short of breath to go out much. His wife provides assistance with bathing and administering medications. He has a walker and a wheelchair but prefers not to use them. ? ?Palliative Symptoms: ?Dyspnea, ascites, difficulty sleeping, cough, occasional itching  ? ?Advance Directives: ?A detailed discussion regarding advanced directives was had. ? ?Code Status: ?Concepts specific to code status, artifical feeding and hydration, and rehospitalization were considered and discussed. ? ?Discussion: ?Patient reports presenting to [facility] due to lack of appropriate resources in his hometown of [place], with inability for EMS to transport to [place]. He typically spends most of his days resting after HD with little time for any other activities. This is acceptable to him and he is committed to continuing with interventions to live as long as possible - this has been his goal and focus throughout his illnesses including liver cancer. His shortness of breath is new and we discussed relationship to his fluid status. We discussed complication of obtaining oxygen and whether the development of this distressing symptom has impacted patient's goals and wishes. He states that he would be willing to consider hospice if he could obtain oxygen for comfort. When providing education and clarifying the hospice philosophy,  he states he would prefer to pay out of pocket for oxygen and continue seeing his specialists for ongoing management of his illnesses. He acknowledges that there will be a day where he may feel differently. We discussed options when his symptom burden outweighs the benefit of aggressive interventions and curative efforts. He is open to ongoing conversations based on his clinical course. In terms of code status, [name] shares that he would accept CPR and mechanical ventilation in all circumstances except for brain death. He would find 10 minutes of CPR efforts acceptable and would want to remain on a ventilator until 3  physicians determined there was no change for recovery. Provided education that the cause of the arrest is likely associated with chronic/terminal disease rather than a reversible acute cardio-pulmonary event. He states that in this situation, he would want to discuss again in the future.  ? ? ?The difference between aggressive medical intervention and comfort care was considered in light of the patient's goals of care. Hospice and Palliative Care services outpatient were explained and offered.  ? ?Discussed the importance of continued conversation with family and the medical providers regarding overall plan of care and treatment options, ensuring decisions are within the context of the patient?s values and GOCs.  ? ?Questions and concerns were addressed.  The family was encouraged to call with questions or concerns.  PMT will continue to support holistically.  ?  ? ?SUMMARY OF RECOMMENDATIONS   ?-Continue full code/full scope treatment ?-Patient is not ready for hospice philosophy ?-PMT will continue to follow incrementally  ? ?Prognosis:  ?Guarded ? ?Discharge Planning: Home with Home Health  ? ?  ? ?Primary Diagnoses: ?Present on Admission: ? Volume overload ? ? ?I have reviewed the medical record, interviewed the patient and family, and examined the patient. The following aspects are pertinent. ? ?Past Medical History:  ?Diagnosis Date  ? Ankylosis of lumbar spine 02/21/2014  ? Benign essential hypertension 10/26/2015  ? Chronic diastolic heart failure ([place]) 09/26/2016  ? Chronic pain associated with significant psychosocial dysfunction 06/17/2015  ? Degeneration of intervertebral disc of lumbar region 07/05/2013  ? ESRD (end stage renal disease) on dialysis ([place]) 03/27/2017  ? Hearing loss   ? Hepatocellular carcinoma ([place]) 12/27/2010  ? Overview:  Embolized 6/12   ? History of liver transplant ([place]) 07/20/2011  ? Overview:  06/07/2011 for cryptogenic cirrhosis and HCC   ? Uncontrolled type 2 diabetes mellitus  with insulin therapy 06/21/2018  ? ?Social History  ? ?Socioeconomic History  ? Marital status: Married  ?  Spouse name: Not on file  ? Number of children: Not on file  ? Years of education: Not on file  ? Highest education level: Not on file  ?Occupational History  ? Not on file  ?Tobacco Use  ? Smoking status: Never  ? Smokeless tobacco: Never  ?Vaping Use  ? Vaping Use: Never used  ?Substance and Sexual Activity  ? Alcohol use: Yes  ?  Alcohol/week: 0.0 standard drinks  ?  Comment: ocassional  ? Drug use: Not Currently  ? Sexual activity: Not on file  ?Other Topics Concern  ? Not on file  ?Social History Narrative  ? Not on file  ? ?Social Determinants of Health  ? ?Financial Resource Strain: Not on file  ?Food Insecurity: Not on file  ?Transportation Needs: Not on file  ?Physical Activity: Not on file  ?Stress: Not on file  ?Social Connections: Not on file  ? ?Family History  ?Problem Relation  Age of Onset  ? Cancer Mother   ? Diabetes Father   ? Heart attack Father   ? Schizophrenia Father   ? Hypertension Brother   ? ?Scheduled Meds: ? allopurinol  150 mg Oral Daily  ? aspirin EC  81 mg Oral Daily  ? atorvastatin  20 mg Oral Daily  ? Chlorhexidine Gluconate Cloth  6 each Topical Q0600  ? gemfibrozil  300 mg Oral BID AC  ? heparin  5,000 Units Subcutaneous Q8H  ? heparin sodium (porcine)      ? insulin aspart  0-6 Units Subcutaneous TID WC  ? mycophenolate  1,000 mg Oral BID  ? sodium chloride flush  3 mL Intravenous Q12H  ? spironolactone  25 mg Oral Daily  ? ?Continuous Infusions: ? albumin human    ? ?PRN Meds:.acetaminophen **OR** acetaminophen, calcium carbonate (dosed in mg elemental calcium), camphor-menthol **AND** hydrOXYzine, docusate sodium, feeding supplement (NEPRO CARB STEADY), hydrALAZINE, lidocaine, midodrine, ondansetron **OR** ondansetron (ZOFRAN) IV, sorbitol, zolpidem ?Medications Prior to Admission:  ?Prior to Admission medications   ?Medication Sig Start Date End Date Taking? Authorizing  Provider  ?allopurinol (ZYLOPRIM) 100 MG tablet Take 150 mg by mouth daily.   Yes [provider]  ?aspirin 81 MG EC tablet Take 81 mg by mouth daily.   Yes [provider]  ?bumetanide Cleda Clarks

## 2021-06-01 NOTE — ED Notes (Signed)
RT notified for Bipap ?

## 2021-06-01 NOTE — Progress Notes (Signed)
Patient arrived to the unit at [facility] from hemodialysis. Pt is alert and oriented x 4, wearing una boots with bilateral wraps to legs. Sacral foam dressing was applied to sacral and skin was assessd by me and Melva RN.  ?

## 2021-06-01 NOTE — ED Triage Notes (Addendum)
Pt comes via [facility] for SOB, pt is a dialysis pt and hx of CHF, goes on MWF and has not missed any days. Pt speaks in short sentences and uses accessory muscles.  ?

## 2021-06-02 DIAGNOSIS — E8770 Fluid overload, unspecified: Secondary | ICD-10-CM | POA: Diagnosis not present

## 2021-06-02 DIAGNOSIS — N289 Disorder of kidney and ureter, unspecified: Secondary | ICD-10-CM | POA: Diagnosis not present

## 2021-06-02 LAB — GLUCOSE, CAPILLARY
Glucose-Capillary: 157 mg/dL — ABNORMAL HIGH (ref 70–99)
Glucose-Capillary: 117 mg/dL — ABNORMAL HIGH (ref 70–99)
Glucose-Capillary: 163 mg/dL — ABNORMAL HIGH (ref 70–99)
Glucose-Capillary: 114 mg/dL — ABNORMAL HIGH (ref 70–99)

## 2021-06-02 LAB — CBC
HCT: 35.6 % — ABNORMAL LOW (ref 39.0–52.0)
Hemoglobin: 10.8 g/dL — ABNORMAL LOW (ref 13.0–17.0)
MCH: 29.6 pg (ref 26.0–34.0)
MCHC: 30.3 g/dL (ref 30.0–36.0)
MCV: 97.5 fL (ref 80.0–100.0)
Platelets: 149 10*3/uL — ABNORMAL LOW (ref 150–400)
RBC: 3.65 MIL/uL — ABNORMAL LOW (ref 4.22–5.81)
RDW: 17.9 % — ABNORMAL HIGH (ref 11.5–15.5)
WBC: 5.8 10*3/uL (ref 4.0–10.5)
nRBC: 0 % (ref 0.0–0.2)

## 2021-06-02 LAB — RENAL FUNCTION PANEL
Albumin: 4.1 g/dL (ref 3.5–5.0)
Anion gap: 14 (ref 5–15)
BUN: 26 mg/dL — ABNORMAL HIGH (ref 8–23)
CO2: 26 mmol/L (ref 22–32)
Calcium: 9.1 mg/dL (ref 8.9–10.3)
Chloride: 95 mmol/L — ABNORMAL LOW (ref 98–111)
Creatinine, Ser: 3.47 mg/dL — ABNORMAL HIGH (ref 0.61–1.24)
GFR, Estimated: 19 mL/min — ABNORMAL LOW (ref >60)
Glucose, Bld: 190 mg/dL — ABNORMAL HIGH (ref 70–99)
Phosphorus: 6 mg/dL — ABNORMAL HIGH (ref 2.5–4.6)
Potassium: 3.8 mmol/L (ref 3.5–5.1)
Sodium: 135 mmol/L (ref 135–145)

## 2021-06-02 LAB — MRSA NEXT GEN BY PCR, NASAL: MRSA by PCR Next Gen: NOT DETECTED

## 2021-06-02 LAB — HEPATITIS B SURFACE ANTIBODY, QUANTITATIVE: Hep B S AB Quant (Post): <3.1 m[IU]/mL — ABNORMAL LOW (ref >9.9)

## 2021-06-02 MED ORDER — HEPARIN SODIUM (PORCINE) 1000 UNIT/ML IJ SOLN
INTRAMUSCULAR | Status: AC
  Administered 2021-06-02: 1000 [IU]
  Filled 2021-06-02: qty 4

## 2021-06-02 MED ORDER — MIDODRINE HCL 5 MG PO TABS
10.0000 mg | ORAL_TABLET | Freq: Every day | ORAL | Status: DC
  Administered 2021-06-02: 10 mg via ORAL
  Filled 2021-06-02 (×2): qty 2

## 2021-06-02 NOTE — Progress Notes (Signed)
SATURATION QUALIFICATIONS: (This note is used to comply with regulatory documentation for home oxygen) ? ?Patient Saturations on Room Air at Rest = 93% ? ?Patient Saturations on Room Air while Ambulating = 92% ? ?Patient Saturations on 0 Liters of oxygen while Ambulating = 92% ? ?Please briefly explain why patient needs home oxygen: ?

## 2021-06-02 NOTE — Progress Notes (Signed)
PT Cancellation Note ? ?Patient Details ?Name: Gregory Tanner ?MRN: [id number] ?DOB: [birth year] ? ? ?Cancelled Treatment:    Reason Eval/Treat Not Completed: Patient at procedure or test/unavailable. Pt at HD treatment, will plan to follow-up later as time permits. ? ? ?Moishe Spice, PT, DPT ?Acute Rehabilitation Services  ?Pager: [phone] ?Office: [phone] ? ? ? ?[name] ?06/02/2021, 8:31 AM ? ? ?

## 2021-06-02 NOTE — Evaluation (Signed)
Occupational Therapy Evaluation ?Patient Details ?Name: Gregory Tanner ?MRN: [id number] ?DOB: [birth year] ?Today's Date: 06/02/2021 ? ? ?History of Present Illness Pt is a 66 y.o. male who presented 06/01/21 with SOB. Admitted with hypervolemia associated with renal insufficiency. PMH: chronic diastolic CHF, HTN, DM, CKD, NASH cirrhosis with [place] s/p liver transplant, ESRD on HD  ? ?Clinical Impression ?  ?Prior to this admission, patient living at home with his wife. States that his wife helps him with lower body dressing, does not shower due to HD cath (bird bathes) and sleeps in a lift recliner. Patient does not manage his medications or drive. Currently, patient on 2L of oxygen, and is requiring mod A for ADLs. OT talking at length with patient as far as home set up, future equipment, and increasing activity tolerance and endurance. Patient receptive to receiving services in the hospital, however with HD schedule and doctors appointments is politely declining OT services at discharge. OT will continue to follow acutely in order to address deficits outlined below.   ?   ? ?Recommendations for follow up therapy are one component of a multi-disciplinary discharge planning process, led by the attending physician.  Recommendations may be updated based on patient status, additional functional criteria and insurance authorization.  ? ?Follow Up Recommendations ? No OT follow up  ?  ?Assistance Recommended at Discharge Frequent or constant Supervision/Assistance  ?Patient can return home with the following A little help with walking and/or transfers;A little help with bathing/dressing/bathroom;Assistance with cooking/housework;Direct supervision/assist for medications management;Direct supervision/assist for financial management;Help with stairs or ramp for entrance;Assist for transportation ? ?  ?Functional Status Assessment ? Patient has had a recent decline in their functional status and demonstrates the ability to make  significant improvements in function in a reasonable and predictable amount of time.  ?Equipment Recommendations ? None recommended by OT (Patient has DME needed)  ?  ?Recommendations for Other Services   ? ? ?  ?Precautions / Restrictions Precautions ?Precautions: Fall ?Restrictions ?Weight Bearing Restrictions: No  ? ?  ? ?Mobility Bed Mobility ?  ?  ?  ?  ?  ?  ?  ?General bed mobility comments: Pt up in recliner upon arrival. ?  ? ?Transfers ?  ?  ?  ?  ?  ?  ?  ?  ?  ?General transfer comment: patient recently up with PT, declining further mobility ?  ? ?  ?Balance Overall balance assessment: Mild deficits observed, not formally tested ?  ?  ?  ?  ?  ?  ?  ?  ?  ?  ?  ?  ?  ?  ?  ?  ?  ?  ?   ? ?ADL either performed or assessed with clinical judgement  ? ?ADL Overall ADL's : Needs assistance/impaired ?Eating/Feeding: Set up;Sitting ?  ?Grooming: Set up;Sitting ?  ?Upper Body Bathing: Set up;Sitting ?  ?Lower Body Bathing: Moderate assistance;Sitting/lateral leans;Sit to/from stand ?  ?Upper Body Dressing : Set up;Sitting ?  ?Lower Body Dressing: Moderate assistance;Sitting/lateral leans;Sit to/from stand ?  ?Toilet Transfer: Minimal assistance;Ambulation;Rolling walker (2 wheels);Grab bars ?  ?  ?  ?  ?  ?Functional mobility during ADLs: Moderate assistance;Cueing for safety;Cueing for sequencing;Rolling walker (2 wheels) ?General ADL Comments: Patient presenting with decreased activity tolerance and endurance  ? ? ? ?Vision Baseline Vision/History: 1 Wears glasses ?Ability to See in Adequate Light: 0 Adequate ?Patient Visual Report: No change from baseline ?   ?   ?Perception   ?  ?  Praxis   ?  ? ?Pertinent Vitals/Pain Pain Assessment ?Pain Assessment: No/denies pain  ? ? ? ?Hand Dominance   ?  ?Extremity/Trunk Assessment Upper Extremity Assessment ?Upper Extremity Assessment: Generalized weakness ?  ?Lower Extremity Assessment ?Lower Extremity Assessment: Defer to PT evaluation ?RLE Deficits / Details: hx of  neuropathy in R toes; MMT scores of 4+ bil hip flexion, 5 bil knee extension, 4+ bil ankle dorsiflexion ?RLE Sensation: history of peripheral neuropathy ?LLE Deficits / Details: Noted muscular endurance deficits functionally as knee buckled slightly with stairs; MMT scores of 4+ bil hip flexion, 5 bil knee extension, 4+ bil ankle dorsiflexion ?  ?Cervical / Trunk Assessment ?Cervical / Trunk Assessment: Kyphotic ?  ?Communication Communication ?Communication: No difficulties ?  ?Cognition Arousal/Alertness: Awake/alert ?Behavior During Therapy: [facility] for tasks assessed/performed ?Overall Cognitive Status: Within Functional Limits for tasks assessed ?  ?  ?  ?  ?  ?  ?  ?  ?  ?  ?  ?  ?  ?  ?  ?  ?General Comments: demonstrating good anticipatory awareness ?  ?  ?General Comments  Patient on 2L O2 with O2 between 92-96% throughout ? ?  ?Exercises   ?  ?Shoulder Instructions    ? ? ?Home Living Family/patient expects to be discharged to:: Private residence ?Living Arrangements: Spouse/significant other ?Available Help at Discharge: Family;Available 24 hours/day ?Type of Home: House ?Home Access: Stairs to enter ?Entrance Stairs-Number of Steps: 2 (small) ?Entrance Stairs-Rails: Right (ascending) ?Home Layout: One level ?  ?  ?Bathroom Shower/Tub: Tub/shower unit ?  ?Bathroom Toilet: Handicapped height ?Bathroom Accessibility: Yes ?  ?Home Equipment: Grab bars - tub/shower;Tub bench;Rolling Walker (2 wheels);Other (comment);Wheelchair - manual;Hand held shower head (walking stick) ?  ?Additional Comments: Sleeps in lift recliner chair ?  ? ?  ?Prior Functioning/Environment Prior Level of Function : Needs assist ?  ?  ?  ?  ?  ?  ?Mobility Comments: Intermittently gets too weak to stand to a point the wife cannot assist him up. Otherwise IND without AD. Has fallen 2x in past 6 months. ?ADLs Comments: Wife assists with lower body dressing intermittently and with bird bathing. Wife does cooking, cleaning, and driving. Pt  can manage own meds and finances, but wife checks it. ?  ? ?  ?  ?OT Problem List: Decreased strength;Decreased activity tolerance;Impaired balance (sitting and/or standing);Decreased coordination;Cardiopulmonary status limiting activity;Decreased knowledge of use of DME or AE;Increased edema;Impaired sensation ?  ?   ?OT Treatment/Interventions: Self-care/ADL training;Therapeutic exercise;Energy conservation;DME and/or AE instruction;Manual therapy;Therapeutic activities;Patient/family education;Balance training  ?  ?OT Goals(Current goals can be found in the care plan section) Acute Rehab OT Goals ?Patient Stated Goal: to get stronger ?OT Goal Formulation: With patient ?Time For Goal Achievement: 06/16/21 ?Potential to Achieve Goals: Fair  ?OT Frequency: Min 3X/week ?  ? ?Co-evaluation   ?  ?  ?  ?  ? ?  ?AM-PAC OT "6 Clicks" Daily Activity     ?Outcome Measure Help from another person eating meals?: A Little ?Help from another person taking care of personal grooming?: A Little ?Help from another person toileting, which includes using toliet, bedpan, or urinal?: A Little ?Help from another person bathing (including washing, rinsing, drying)?: A Lot ?Help from another person to put on and taking off regular upper body clothing?: A Little ?Help from another person to put on and taking off regular lower body clothing?: A Lot ?6 Click Score: 16 ?  ?End of Session Nurse Communication:  Mobility status ? ?Activity Tolerance: Patient tolerated treatment well;Patient limited by fatigue ?Patient left: in chair;with call bell/phone within reach;with family/visitor present ? ?OT Visit Diagnosis: Unsteadiness on feet (R26.81);Other abnormalities of gait and mobility (R26.89);Repeated falls (R29.6);History of falling (Z91.81);Muscle weakness (generalized) (M62.81)  ?              ?Time: 6606-0045 ?OT Time Calculation (min): 18 min ?Charges:  OT General Charges ?$OT Visit: 1 Visit ?OT Evaluation ?$OT Eval Moderate Complexity: 1  Mod ? ?Corinne Ports E. Amelya Mabry, OTR/L ?Acute Rehabilitation Services ?[phone] ?[phone]  ? ?Corinne Ports Aluel Schwarz ?06/02/2021, 3:19 PM ?

## 2021-06-02 NOTE — Progress Notes (Signed)
OT Cancellation Note ? ?Patient Details ?Name: Gregory Tanner ?MRN: [id number] ?DOB: August 02, 1955 ? ? ?Cancelled Treatment:     Patient off floor at dialysis, OT will follow back for evaluation as time permits.  ? ?Corinne Ports E. Candra Wegner, OTR/L ?Acute Rehabilitation Services ?[phone] ?[phone]  ? ?Corinne Ports Rashel Okeefe ?06/02/2021, 8:40 AM ?

## 2021-06-02 NOTE — TOC Progression Note (Signed)
Transition of Care (TOC) - Progression Note  ? ? ?Patient Details  ?Name: Gregory Tanner ?MRN: [id number] ?Date of Birth: Jan 23, 1956 ? ?Transition of Care (TOC) CM/SW Contact  ?Zenon Mayo, RN ?Phone Number: ?06/02/2021, 4:26 PM ? ?Clinical Narrative:    ?from home with wife, sob, ascites, paracentesis done, dialysis done. Managing fluid with diaylsis, palliative to see,he has liver txpt also per information from progression, TOC will continue to follow for dc needs.  ? ? ?  ?  ? ?Expected Discharge Plan and Services ?  ?  ?  ?  ?  ?                ?  ?  ?  ?  ?  ?  ?  ?  ?  ?  ? ? ?Social Determinants of Health (SDOH) Interventions ?  ? ?Readmission Risk Interventions ? ?  12/03/2020  ?  1:43 PM  ?Readmission Risk Prevention Plan  ?Transportation Screening Complete  ?PCP or Specialist Appt within 3-5 Days Complete  ?[place] or Home Care Consult Complete  ?Social Work Consult for [place] Planning/Counseling Complete  ?Palliative Care Screening Not Applicable  ?Medication Review Press photographer) Complete  ? ? ?

## 2021-06-02 NOTE — Progress Notes (Signed)
Patient states that he does not want IV access, elaborating that there are no IV medications scheduled and therefore no need for IV access. Educated on possibility of emergent situation, especially given hemodialysis. Patient acknowledges this point but states that IV access can be established at that time. Further educated that there may be delays preventing a timely IV insertion during an emergency situation placing him at risk for deterioration or death. Patient acknowledges concerns but remains steadfast in desire to remove IV access. ? ?Patient is alert and oriented with no legal guardian, psychiatric history, or any other legally valid reason to maintain against his will. IV will be removed by NT under RN supervision. ?

## 2021-06-02 NOTE — Progress Notes (Signed)
[facility] ?Progress Note ? ?Subjective: pt had HD yest w/ 2.8 L UF.  Low BP's were an issue.  ? ?Vitals:  ? 06/01/21 2046 06/02/21 0015 06/02/21 0438 06/02/21 0748  ?BP: (!) 111/54 (!) 95/58 110/61 131/76  ?Pulse: (!) 103 98 85 (!) 101  ?Resp: '20 20 20 20  '$ ?Temp: 98.6 ?F (37 ?C) 98.5 ?F (36.9 ?C) 98.7 ?F (37.1 ?C) 98.3 ?F (36.8 ?C)  ?TempSrc: Oral Oral Oral Oral  ?SpO2: 98% 98% 98% 96%  ?Weight:   82.9 kg   ? ? ?Exam: ?Gen alert, no distress ?+JVD ?Chest occ rales at bases, mostly clear ?RRR no RG ?Abd soft 2+ ascites ntnd no mass +bs ?Ext diffuse 2+ pretib edema bilat, lower legs wrapped bilat ?Neuro is alert, Ox 3 , nf ?   RIJ TDC in place ? ?  ? Home meds include - xyloprim, asa, lipitor, bumex '2mg'$ , lopid, hydralazine 25 qam, insulin lispro, metolazone 4 d/ wk, cellcept 1 gm bid, prilosec, klor-con, aldactone 25 qd, prns/ vits/ supps ?  ?  ?  ? OP HD: [facility] MWF ? 4h  400/800  84kg  2/2.5 bath  RIJ TDC  ?-  4/18 lab showed hep B Ag neg and hep B Abs were low / not protective ?- last 3 sessions came off 3-4 kg over ?- venofer 50 mg per wk ?- last Hb 10.6 on 4/12 ?  ? CXR 4/18 - vasc congestion and patchy early edema ?  ?Assessment/ Plan: ?SOB/ vol overload - acute hypoxic resp failure due to pulm edema/ vol overload. BP's are soft, this appears to be chronic. He has been having sig vol overload issues since starting HD. With midodrine and IV alb yesterday we were able to get off 2.5 L. Plan next HD today.  ?Ascites/ hx liver transplant - sp LVP 3 L here 4/18.  ?Hypotension/vol - started midodrine 10 mg pre HD mwf. Still looks vol overloaded. UF goal 2-3 L today.  ?ESRD - on HD MWF, on HD x 2 mos. HD here yest, next HD today.  ?DM2 - on insulin ?Anemia ckd - Hb > 10, not on esa at OP unit. Cont weekly IV Fe.  ?MBD ckd - CCa in range, add on phos. No vdra. Not sure if on binders.  ?SP liver transplant - f/b [facility], on cellcept ? ? ? ?Rob Doctor, hospital ?06/02/2021, 8:17 AM ? ? ?Recent Labs  ?Lab 06/01/21 ?0217   ?HGB 10.8*  ?ALBUMIN 4.0  ?CALCIUM 8.6*  ?CREATININE 3.33*  ?K 4.4  ? ?Inpatient medications: ? allopurinol  150 mg Oral Daily  ? aspirin EC  81 mg Oral Daily  ? Chlorhexidine Gluconate Cloth  6 each Topical Q0600  ? heparin  5,000 Units Subcutaneous Q8H  ? insulin aspart  0-6 Units Subcutaneous TID WC  ? midodrine  10 mg Oral Daily  ? mycophenolate  1,000 mg Oral BID  ? sodium chloride flush  3 mL Intravenous Q12H  ? ? ferric gluconate (FERRLECIT) IVPB    ? ?acetaminophen **OR** acetaminophen, calcium carbonate (dosed in mg elemental calcium), camphor-menthol **AND** hydrOXYzine, docusate sodium, feeding supplement (NEPRO CARB STEADY), hydrALAZINE, lidocaine, ondansetron **OR** ondansetron (ZOFRAN) IV, sorbitol, zolpidem ? ? ? ? ? ? ?

## 2021-06-02 NOTE — Progress Notes (Signed)
?PROGRESS NOTE ? ? ? ?Gregory Tanner  ZOX:[id number] DOB: [birth year] DOA: 06/01/2021 ?PCP: Ronita Hipps, MD  ?Narrative Gregory Tanner is a 66 y.o. male with medical history significant of HTN; chronic pain; ESRD on HD; right heart failure, chronic diastolic CHF; DM; and NASH cirrhosis with [facility] s/p liver transplant presented to the ED with SOB.  Has right heart failure with ESRD causing recurrent hospitalizations with volume overload. ?- He also has had recurrent ascites requiring paracentesis in increasing frequency.  ?-Presented to the ED with worsening dyspnea on exertion, orthopnea ?-In the ED he was in mild respiratory distress, chest x-ray showing vascular congestion.  Not hypoxic but placed on BiPAP for comfort ? ? ?Subjective: ?Feels better, breathing is improving ? ?Assessment and Plan: ? ?Acute hypoxic respiratory failure ?Volume overload in an ESRD on HD patient ?-Patient has multisystem organ dysfunction with cardiorenal syndrome plus recurrent ascites ?-Remains volume overloaded, was dialyzed yesterday and had 3 L removed via paracentesis as well ?-Plan for HD today,-regular days are MWF S ?-Continue midodrine on dialysis days ?-Salt, fluid restriction emphasized ?-Wean off O2 after HD if tolerated ?  ?Acute on chronic systolic and diastolic CHF ?Severe right heart failure ?Pulmonary hypertension ?Severe TR ?Recurrent ascites ?-Prior echos with preserved EF but grade 2 diastolic dysfunction ?-Has marked JVD and generalized anasarca ?-Followed by heart failure team at [facility] ?-Volume management with HD ?-Off all diuretics now ?  ?HTN -> hypotension ?-Hold all home BP medications due to HD-associated hypotension ?-Start midodrine 10 mg pre-HD sessions ?  ?DM ?-Prior A1c was 5.2, indicating good control ?-CBGs are stable, continue sensitive sliding scale insulin ?  ?H/o liver transplant ?-Continue Cellcept, followed by liver transplant team at [facility] ?  ?HLD ?-He is no longer taking atorvastatin or  gemfibrozil ?  ?Goals of care ?-Per [facility] note on 3/28, "he has a very complex interplay of multiorgan system dysfunction" ... with a "progressive pathology that will inevidtably lead to worsening cardiac and also renal functioning" and "medical therapy does not reverse or delay the natural history of this disorder" ?-He does not appear to understand the gravity of his situation, as overall his prognosis is fairly bleak ?-Palliative care consultation requested by Dr. Lorin Mercy yesterday ?  ?DVT prophylaxis: Lovenox ?  ?Advance Care Planning:   Code Status: Full Code  ?Disposition Home later today or in a.m. ?  ?Consults: Nephrology; IR; palliative care; TOC team; PT/OT ? ? ?Consultants:  ? ? ?Procedures:  ? ?Antimicrobials:  ? ? ?Objective: ?Vitals:  ? 06/02/21 0832 06/02/21 0900 06/02/21 0930 06/02/21 1000  ?BP: (!) 140/47 (!) 109/53 (!) 100/39 (!) 95/50  ?Pulse: 100 90 86 80  ?Resp: 18     ?Temp:      ?TempSrc:      ?SpO2:      ?Weight:      ? ? ?Intake/Output Summary (Last 24 hours) at 06/02/2021 1021 ?Last data filed at 06/02/2021 0800 ?Gross per 24 hour  ?Intake 243 ml  ?Output 2800 ml  ?Net -2557 ml  ? ?Filed Weights  ? 06/01/21 1651 06/02/21 0438 06/02/21 0826  ?Weight: 83 kg 82.9 kg 83.6 kg  ? ? ?Examination: ? ?General exam: Pleasant chronically ill male sitting up in bed, AAOx3, no distress ?HEENT: Positive JVD ?CVS: S1-S2, regular rate rhythm ?Lungs: Few basilar Rales, decreased breath sounds to bases ?Abdomen: Soft, distended, fluid thrill, bowel sounds present ?Extremities: 2+ edema ?Skin: No rashes ?Psychiatry:  Mood & affect appropriate.  ? ? ? ?  Data Reviewed:  ? ?CBC: ?Recent Labs  ?Lab 06/01/21 ?6789 06/02/21 ?3810  ?WBC 5.9 5.8  ?NEUTROABS 4.3  --   ?HGB 10.8* 10.8*  ?HCT 34.4* 35.6*  ?MCV 97.5 97.5  ?PLT 158 149*  ? ?Basic Metabolic Panel: ?Recent Labs  ?Lab 06/01/21 ?1751 06/02/21 ?0258  ?NA 131* 135  ?K 4.4 3.8  ?CL 94* 95*  ?CO2 24 26  ?GLUCOSE 102* 190*  ?BUN 36* 26*  ?CREATININE 3.33* 3.47*   ?CALCIUM 8.6* 9.1  ?PHOS  --  6.0*  ? ?GFR: ?Estimated Creatinine Clearance: 21.3 mL/min (A) (by C-G formula based on SCr of 3.47 mg/dL (H)). ?Liver Function Tests: ?Recent Labs  ?Lab 06/01/21 ?5277 06/02/21 ?8242  ?AST 20  --   ?ALT 16  --   ?ALKPHOS 114  --   ?BILITOT 0.9  --   ?PROT 6.6  --   ?ALBUMIN 4.0 4.1  ? ?No results for input(s): LIPASE, AMYLASE in the last 168 hours. ?No results for input(s): AMMONIA in the last 168 hours. ?Coagulation Profile: ?No results for input(s): INR, PROTIME in the last 168 hours. ?Cardiac Enzymes: ?No results for input(s): CKTOTAL, CKMB, CKMBINDEX, TROPONINI in the last 168 hours. ?BNP (last 3 results) ?No results for input(s): PROBNP in the last 8760 hours. ?HbA1C: ?No results for input(s): HGBA1C in the last 72 hours. ?CBG: ?Recent Labs  ?Lab 06/01/21 ?2117 06/02/21 ?0610  ?GLUCAP 200* 157*  ? ?Lipid Profile: ?No results for input(s): CHOL, HDL, LDLCALC, TRIG, CHOLHDL, LDLDIRECT in the last 72 hours. ?Thyroid Function Tests: ?No results for input(s): TSH, T4TOTAL, FREET4, T3FREE, THYROIDAB in the last 72 hours. ?Anemia Panel: ?No results for input(s): VITAMINB12, FOLATE, FERRITIN, TIBC, IRON, RETICCTPCT in the last 72 hours. ?Urine analysis: ?No results found for: COLORURINE, APPEARANCEUR, [place], [place], [place], [place], [place], KETONESUR, PROTEINUR, [place], NITRITE, LEUKOCYTESUR ?Sepsis Labs: ?'@LABRCNTIP'$ (procalcitonin:4,lacticidven:4) ? ?) ?Recent Results (from the past 240 hour(s))  ?Resp Panel by RT-PCR (Flu A&B, Covid) Nasopharyngeal Swab     Status: None  ? Collection Time: 06/01/21  2:52 AM  ? Specimen: Nasopharyngeal Swab; Nasopharyngeal(NP) swabs in vial transport medium  ?Result Value Ref Range Status  ? SARS Coronavirus 2 by RT PCR NEGATIVE NEGATIVE Final  ?  Comment: (NOTE) ?SARS-CoV-2 target nucleic acids are NOT DETECTED. ? ?The SARS-CoV-2 RNA is generally detectable in upper respiratory ?specimens during the acute phase of infection. The  lowest ?concentration of SARS-CoV-2 viral copies this assay can detect is ?138 copies/mL. A negative result does not preclude SARS-Cov-2 ?infection and should not be used as the sole basis for treatment or ?other patient management decisions. A negative result may occur with  ?improper specimen collection/handling, submission of specimen other ?than nasopharyngeal swab, presence of viral mutation(s) within the ?areas targeted by this assay, and inadequate number of viral ?copies(<138 copies/mL). A negative result must be combined with ?clinical observations, patient history, and epidemiological ?information. The expected result is Negative. ? ?Fact Sheet for Patients:  ?EntrepreneurPulse.com.au ? ?Fact Sheet for Healthcare Providers:  ?IncredibleEmployment.be ? ?This test is no t yet approved or cleared by the [place] FDA and  ?has been authorized for detection and/or diagnosis of SARS-CoV-2 by ?FDA under an Emergency Use Authorization (EUA). This EUA will remain  ?in effect (meaning this test can be used) for the duration of the ?COVID-19 declaration under Section 564(b)(1) of the Act, 21 ?U.S.C.section 360bbb-3(b)(1), unless the authorization is terminated  ?or revoked sooner.  ? ? ?  ? Influenza A by PCR NEGATIVE NEGATIVE  Final  ? Influenza B by PCR NEGATIVE NEGATIVE Final  ?  Comment: (NOTE) ?The Xpert Xpress SARS-CoV-2/FLU/RSV plus assay is intended as an aid ?in the diagnosis of influenza from Nasopharyngeal swab specimens and ?should not be used as a sole basis for treatment. Nasal washings and ?aspirates are unacceptable for Xpert Xpress SARS-CoV-2/FLU/RSV ?testing. ? ?Fact Sheet for Patients: ?EntrepreneurPulse.com.au ? ?Fact Sheet for Healthcare Providers: ?IncredibleEmployment.be ? ?This test is not yet approved or cleared by the [place] FDA and ?has been authorized for detection and/or diagnosis of SARS-CoV-2 by ?FDA under  an Emergency Use Authorization (EUA). This EUA will remain ?in effect (meaning this test can be used) for the duration of the ?COVID-19 declaration under Section 564(b)(1) of the Act, 21 U.S.C. ?section 360bbb-3(b)(1), unles

## 2021-06-02 NOTE — Evaluation (Signed)
Physical Therapy Evaluation ?Patient Details ?Name: Gregory Tanner ?MRN: [id number] ?DOB: 08-25-1955 ?Today's Date: 06/02/2021 ? ?History of Present Illness ? Pt is a 66 y.o. male who presented 06/01/21 with SOB. Admitted with hypervolemia associated with renal insufficiency. PMH: chronic diastolic CHF, HTN, DM, CKD, NASH cirrhosis with [place] s/p liver transplant, ESRD on HD ?  ?Clinical Impression ? Pt presents with condition above and deficits mentioned below, see PT Problem List. PTA, he was living with his wife in a 1-level house with 2 small STE. Pt reports he usually is IND without DME for mobility, but intermittently gets so fatigued/weak that he cannot stand up even with assistance from his wife. He reports x2 falls in the past 6 months. Currently, pt displays deficits in lower extremity muscular endurance, activity tolerance, and balance that place him at risk for falls. He was able to perform all functional mobility, including ambulating without UE support and navigating stairs with x1 handrail support, at a min guard-supervision level. However, his L knee did partially buckle on the stairs secondary to his muscular endurance deficits. While his SpO2 levels remained >/= 92% on RA throughout, he does display DOE 3/4, limiting his mobility tolerance. Educated pt on increasing frequency of short bouts of activities and performing functional strengthening through performing repeated sit <> stand reps. He verbalized understanding and expressed no desire for follow-up PT after d/c as he has had multiple bouts of HHPT and understands the HEP previously repeatedly provided by them. Pt is agreeable to acute PT services to further maximize his return to baseline prior to d/c.  ?   ? ?Recommendations for follow up therapy are one component of a multi-disciplinary discharge planning process, led by the attending physician.  Recommendations may be updated based on patient status, additional functional criteria and  insurance authorization. ? ?Follow Up Recommendations No PT follow up (declining HHPT) ? ?  ?Assistance Recommended at Discharge Intermittent Supervision/Assistance  ?Patient can return home with the following ? A little help with bathing/dressing/bathroom;Help with stairs or ramp for entrance;Assist for transportation;Assistance with cooking/housework ? ?  ?Equipment Recommendations None recommended by PT  ?Recommendations for Other Services ?    ?  ?Functional Status Assessment Patient has had a recent decline in their functional status and demonstrates the ability to make significant improvements in function in a reasonable and predictable amount of time.  ? ?  ?Precautions / Restrictions Precautions ?Precautions: Fall ?Restrictions ?Weight Bearing Restrictions: No  ? ?  ? ?Mobility ? Bed Mobility ?  ?  ?  ?  ?  ?  ?  ?General bed mobility comments: Pt up in recliner upon arrival. ?  ? ?Transfers ?Overall transfer level: Needs assistance ?Equipment used: None ?Transfers: Sit to/from Stand ?Sit to Stand: Supervision ?  ?  ?  ?  ?  ?General transfer comment: Supervision for safety, no LOB. ?  ? ?Ambulation/Gait ?Ambulation/Gait assistance: Min guard, Supervision ?Gait Distance (Feet): 200 Feet ?Assistive device: None ?Gait Pattern/deviations: Step-through pattern, Decreased stride length, Trunk flexed ?Gait velocity: reduced ?Gait velocity interpretation: 1.31 - 2.62 ft/sec, indicative of limited community ambulator ?  ?General Gait Details: Pt with slightly slowed gait and slightly kyphotic posture. No LOB, min guard-supervision for safety. DOE 3/4 but SpO2 >/= 92% on RA throughout ? ?Stairs ?Stairs: Yes ?Stairs assistance: Min guard ?Stair Management: One rail Right, Step to pattern, Forwards, Backwards ?Number of Stairs: 3 ?General stair comments: Ascends forwards and descends backwards with R rail, noting partial L knee buckle  when ascending but able to recover without assistance. Min guard for  safety. ? ?Wheelchair Mobility ?  ? ?Modified Rankin (Stroke Patients Only) ?  ? ?  ? ?Balance Overall balance assessment: Mild deficits observed, not formally tested ?  ?  ?  ?  ?  ?  ?  ?  ?  ?  ?  ?  ?  ?  ?  ?  ?  ?  ?   ? ? ? ?Pertinent Vitals/Pain Pain Assessment ?Pain Assessment: Faces ?Faces Pain Scale: Hurts a little bit ?Pain Location: grimacing with coughing ?Pain Descriptors / Indicators: Grimacing ?Pain Intervention(s): Limited activity within patient's tolerance, Monitored during session, Repositioned  ? ? ?Home Living Family/patient expects to be discharged to:: Private residence ?Living Arrangements: Spouse/significant other ?Available Help at Discharge: Family;Available 24 hours/day ?Type of Home: House ?Home Access: Stairs to enter ?Entrance Stairs-Rails: Right (ascending) ?Entrance Stairs-Number of Steps: 2 (small) ?  ?Home Layout: One level ?Home Equipment: Grab bars - tub/shower;Tub bench;Rolling Walker (2 wheels);Other (comment);Wheelchair - manual;Hand held shower head (walking stick) ?Additional Comments: Sleeps in lift recliner chair  ?  ?Prior Function Prior Level of Function : Needs assist ?  ?  ?  ?  ?  ?  ?Mobility Comments: Intermittently gets too weak to stand to a point the wife cannot assist him up. Otherwise IND without AD. Has fallen 2x in past 6 months. ?ADLs Comments: Wife assists with lower body dressing intermittently and with bird bathing. Wife does cooking, cleaning, and driving. Pt can manage own meds and finances, but wife checks it. ?  ? ? ?Hand Dominance  ?   ? ?  ?Extremity/Trunk Assessment  ? Upper Extremity Assessment ?Upper Extremity Assessment: Defer to OT evaluation ?  ? ?Lower Extremity Assessment ?Lower Extremity Assessment: LLE deficits/detail;RLE deficits/detail ?RLE Deficits / Details: hx of neuropathy in R toes; MMT scores of 4+ bil hip flexion, 5 bil knee extension, 4+ bil ankle dorsiflexion ?RLE Sensation: history of peripheral neuropathy ?LLE Deficits /  Details: Noted muscular endurance deficits functionally as knee buckled slightly with stairs; MMT scores of 4+ bil hip flexion, 5 bil knee extension, 4+ bil ankle dorsiflexion ?  ? ?Cervical / Trunk Assessment ?Cervical / Trunk Assessment: Kyphotic  ?Communication  ? Communication: No difficulties  ?Cognition Arousal/Alertness: Awake/alert ?Behavior During Therapy: [employer] for tasks assessed/performed ?Overall Cognitive Status: Within Functional Limits for tasks assessed ?  ?  ?  ?  ?  ?  ?  ?  ?  ?  ?  ?  ?  ?  ?  ?  ?  ?  ?  ? ?  ?General Comments General comments (skin integrity, edema, etc.): SpO2 >/= 92% on RA throughout; educated pt on sit <> stand exercises and frequenct short bouts of activity to improve his strength and endurance ? ?  ?Exercises    ? ?Assessment/Plan  ?  ?PT Assessment Patient needs continued PT services  ?PT Problem List Decreased strength;Decreased activity tolerance;Decreased mobility;Decreased balance;Cardiopulmonary status limiting activity;Impaired sensation ? ?   ?  ?PT Treatment Interventions DME instruction;Gait training;Stair training;Functional mobility training;Therapeutic activities;Therapeutic exercise;Neuromuscular re-education;Balance training;Patient/family education   ? ?PT Goals (Current goals can be found in the Care Plan section)  ?Acute Rehab PT Goals ?Patient Stated Goal: to breathe better ?PT Goal Formulation: With patient/family ?Time For Goal Achievement: 06/16/21 ?Potential to Achieve Goals: Good ? ?  ?Frequency Min 3X/week ?  ? ? ?Co-evaluation   ?  ?  ?  ?  ? ? ?  ?  AM-PAC PT "6 Clicks" Mobility  ?Outcome Measure Help needed turning from your back to your side while in a flat bed without using bedrails?: None ?Help needed moving from lying on your back to sitting on the side of a flat bed without using bedrails?: None ?Help needed moving to and from a bed to a chair (including a wheelchair)?: A Little ?Help needed standing up from a chair using your arms (e.g.,  wheelchair or bedside chair)?: A Little ?Help needed to walk in hospital room?: A Little ?Help needed climbing 3-5 steps with a railing? : A Little ?6 Click Score: 20 ? ?  ?End of Session Equipment Utilized During Treatment: Gait b

## 2021-06-03 DIAGNOSIS — N289 Disorder of kidney and ureter, unspecified: Secondary | ICD-10-CM | POA: Diagnosis not present

## 2021-06-03 DIAGNOSIS — E8770 Fluid overload, unspecified: Secondary | ICD-10-CM | POA: Diagnosis not present

## 2021-06-03 LAB — GLUCOSE, CAPILLARY: Glucose-Capillary: 131 mg/dL — ABNORMAL HIGH (ref 70–99)

## 2021-06-03 MED ORDER — HYDROCORTISONE (PERIANAL) 2.5 % EX CREA
TOPICAL_CREAM | Freq: Two times a day (BID) | CUTANEOUS | Status: DC
  Filled 2021-06-03: qty 28.35

## 2021-06-03 MED ORDER — NYSTATIN 100000 UNIT/GM EX POWD
Freq: Two times a day (BID) | CUTANEOUS | Status: DC
  Filled 2021-06-03: qty 15

## 2021-06-03 NOTE — Progress Notes (Signed)
TRH night cross cover note: ? ?I was notified by RN that the patient is refusing this evening's dose of subcutaneous heparin, which he is ordered on a q8 hour basis for DVT prophylaxis. ? ? ? ? ?Babs Bertin, DO ?Hospitalist ? ?

## 2021-06-03 NOTE — TOC Initial Note (Signed)
Transition of Care (TOC) - Initial/Assessment Note  ? ? ?Patient Details  ?Name: Gregory Tanner ?MRN: [id number] ?Date of Birth: 12-13-1955 ? ?Transition of Care (TOC) CM/SW Contact:    ?Zenon Mayo, RN ?Phone Number: ?06/03/2021, 9:09 AM ? ?Clinical Narrative:                 ?Patient is for dc today, he states he has a HHRN to do his wraps but he is not sure what agency they are from.   ? ?Expected Discharge Plan: [place] ?Barriers to Discharge: No Barriers Identified ? ? ?Patient Goals and CMS Choice ?Patient states their goals for this hospitalization and ongoing recovery are:: return home with wife ?  ?  ? ?Expected Discharge Plan and Services ?Expected Discharge Plan: [place] ?  ?Discharge Planning Services: CM Consult ?  ?Living arrangements for the past 2 months: [place] ?Expected Discharge Date: 06/03/21               ?  ?DME Agency: NA ?  ?  ?  ?  ?  ?  ?  ?  ? ?Prior Living Arrangements/Services ?Living arrangements for the past 2 months: [place] ?Lives with:: Spouse ?Patient language and need for interpreter reviewed:: Yes ?Do you feel safe going back to the place where you live?: Yes      ?  ?Care giver support system in place?: Yes (comment) ?Current home services:  (rolling walker) ?Criminal Activity/Legal Involvement Pertinent to Current Situation/Hospitalization: No - Comment as needed ? ?Activities of Daily Living ?  ?  ? ?Permission Sought/Granted ?  ?  ?   ?   ?   ?   ? ?Emotional Assessment ?Appearance:: Appears stated age ?Attitude/Demeanor/Rapport: Engaged ?Affect (typically observed): Appropriate ?Orientation: : Oriented to Situation, Oriented to  Time, Oriented to Place, Oriented to Self ?Alcohol / Substance Use: Not Applicable ?Psych Involvement: No (comment) ? ?Admission diagnosis:  SOB (shortness of breath) [R06.02] ?Volume overload [E87.70] ?Patient Active Problem List  ? Diagnosis Date Noted  ? Acute respiratory  distress 12/05/2020  ? Ascites   ? Thrombocytopenia ([place]) 11/23/2020  ? Iron deficiency anemia 11/23/2020  ? Acute exacerbation of CHF (congestive heart failure) ([place]) 11/22/2020  ? Hypervolemia associated with renal insufficiency 01/22/2020  ? PHT (pulmonary hypertension) ([place]) 01/19/2020  ? Swelling   ? Liver disease   ? Hypertension   ? Hearing loss   ? Diabetes mellitus without complication ([place])   ? Hypokalemia   ? Acute on chronic diastolic CHF (congestive heart failure) ([place]) 07/24/2019  ? Uncontrolled type 2 diabetes mellitus with insulin therapy 06/21/2018  ? CKD (chronic kidney disease) stage 3, GFR 30-59 ml/min (HCC) 03/27/2017  ? Chronic diastolic heart failure ([place]) 09/26/2016  ? Chronic pain disorder 10/28/2015  ? SOB (shortness of breath) 10/28/2015  ? Hypertensive heart disease with heart failure ([place]) 10/26/2015  ? Benign essential hypertension 10/26/2015  ? Chronic pain associated with significant psychosocial dysfunction 06/17/2015  ? Nerve root pain 06/17/2015  ? Ankylosis of lumbar spine 02/21/2014  ? Degeneration of intervertebral disc of lumbar region 07/05/2013  ? Lumbar radiculopathy 10/10/2012  ? Degenerative disc disease, lumbar 07/26/2012  ? Goals of care, counseling/discussion 10/10/2011  ? Excess weight 10/10/2011  ? History of liver transplant ([place]) 07/20/2011  ? Type 2 diabetes mellitus ([place]) 12/27/2010  ? Hepatocellular carcinoma ([place]) 12/27/2010  ? BP (high blood pressure) 12/27/2010  ? ?PCP:  Ronita Hipps, MD ?Pharmacy:   ?[place], [place] ?[place] [address] ?Phone: [phone] Fax: [phone] ? ? ? ? ?Social Determinants of Health (SDOH) Interventions ?  ? ?Readmission Risk Interventions ? ?  06/03/2021  ?  9:04 AM 12/03/2020  ?  1:43 PM  ?Readmission Risk Prevention Plan  ?Transportation Screening Complete Complete  ?PCP or Specialist Appt within 3-5 Days  Complete  ?[place] or Home Care Consult  Complete  ?Social Work Consult  for [place] Planning/Counseling  Complete  ?Palliative Care Screening  Not Applicable  ?Medication Review Press photographer) Complete Complete  ?PCP or Specialist appointment within 3-5 days of discharge Complete   ?[place] or Home Care Consult Complete   ?SW Recovery Care/Counseling Consult Complete   ?Palliative Care Screening Not Applicable   ?[place] Not Applicable   ? ? ? ?

## 2021-06-03 NOTE — Progress Notes (Signed)
[facility] ?Progress Note ? ?Subjective: pt had HD yest w/ 2.5 L UF. Feeling much better, legs are "almost back to normal".  Going home today.  ? ?Vitals:  ? 06/02/21 1218 06/02/21 2144 06/03/21 0341 06/03/21 0751  ?BP: 116/80 (!) 125/59 113/63 (!) 116/58  ?Pulse: (!) 103 (!) 109 98 99  ?Resp: '20 20 20 18  '$ ?Temp: 97.9 ?F (36.6 ?C) 98.6 ?F (37 ?C) 98.2 ?F (36.8 ?C) 98 ?F (36.7 ?C)  ?TempSrc: Oral Oral Oral Oral  ?SpO2: 95% 97% 98% 93%  ?Weight:   82.6 kg   ? ? ?Exam: ?Gen alert, no distress ?+JVD ?Chest occ rales at bases, mostly clear ?RRR no RG ?Abd soft 2+ ascites ntnd no mass +bs ?Ext 1+ edema bilat, lower legs wrapped  ?Neuro is alert, Ox 3 , nf ?   RIJ TDC in place ? ?  ? Home meds include - xyloprim, asa, lipitor, bumex '2mg'$ , lopid, hydralazine 25 qam, insulin lispro, metolazone 4 d/ wk, cellcept 1 gm bid, prilosec, klor-con, aldactone 25 qd, prns/ vits/ supps ?  ?  ?  ? OP HD: [facility] MWF ? 4h  400/800  84kg  2/2.5 bath  RIJ TDC  ?-  4/18 lab showed hep B Ag neg and hep B Abs were low / not protective ?- last 3 sessions came off 3-4 kg over ?- venofer 50 mg per wk ?- last Hb 10.6 on 4/12 ?  ? CXR 4/18 - vasc congestion and patchy early edema ?  ?Assessment/ Plan: ?SOB/ vol overload - acute hypoxic resp failure due to pulm edema/ vol overload. He has been having sig vol overload issues since starting HD. With HD we were able to get off 2.8 L on Tuesday and 2.5 L on Wednesday. Can drop dry wt down to 82kg at dc.  ?Ascites/ hx liver transplant - sp LVP 3 L here 4/18.  ?Hypotension  - started midodrine 10 mg pre HD mwf.  ?ESRD - on HD MWF, on HD x 2 mos. HD here yest, next HD today.  ?DM2 - on insulin ?Anemia ckd - Hb > 10, not on esa at OP unit. Cont weekly IV Fe.  ?MBD ckd - CCa in range, add on phos. No vdra. Not sure if on binders.  ?SP liver transplant - f/b [facility], on cellcept ?Dispo - stable for d/c from renal standpoint ? ? ? ? ?Rob Doctor, hospital ?06/03/2021, 12:41 PM ? ? ?Recent Labs  ?Lab  06/01/21 ?6433 06/02/21 ?2951  ?HGB 10.8* 10.8*  ?ALBUMIN 4.0 4.1  ?CALCIUM 8.6* 9.1  ?PHOS  --  6.0*  ?CREATININE 3.33* 3.47*  ?K 4.4 3.8  ? ? ?Inpatient medications: ? allopurinol  150 mg Oral Daily  ? aspirin EC  81 mg Oral Daily  ? Chlorhexidine Gluconate Cloth  6 each Topical Q0600  ? heparin  5,000 Units Subcutaneous Q8H  ? hydrocortisone   Rectal BID  ? insulin aspart  0-6 Units Subcutaneous TID WC  ? midodrine  10 mg Oral Daily  ? mycophenolate  1,000 mg Oral BID  ? nystatin   Topical BID  ? sodium chloride flush  3 mL Intravenous Q12H  ? ? ferric gluconate (FERRLECIT) IVPB Stopped (06/02/21 1223)  ? ?acetaminophen **OR** acetaminophen, calcium carbonate (dosed in mg elemental calcium), camphor-menthol **AND** hydrOXYzine, docusate sodium, feeding supplement (NEPRO CARB STEADY), hydrALAZINE, lidocaine, ondansetron **OR** ondansetron (ZOFRAN) IV, sorbitol, zolpidem ? ? ? ? ? ? ?

## 2021-06-03 NOTE — Progress Notes (Signed)
Pt for d/c today. Contacted [facility] to advise clinic of pt's d/c today and that pt will resume care tomorrow.  ? ?Melven Sartorius ?Renal Navigator ?[phone] ?

## 2021-06-03 NOTE — TOC Transition Note (Signed)
Transition of Care (TOC) - CM/SW Discharge Note ? ? ?Patient Details  ?Name: Gregory Tanner ?MRN: [id number] ?Date of Birth: [birth year] ? ?Transition of Care (TOC) CM/SW Contact:  ?Zenon Mayo, RN ?Phone Number: ?06/03/2021, 9:56 AM ? ? ?Clinical Narrative:    ?Patient is for dc today, NCM confirmed with Malachy Mood with [facility] he is active for [facility] for the [place] office.  Wife to transport home.  ? ? ?Final next level of care: [place] ?Barriers to Discharge: No Barriers Identified ? ? ?Patient Goals and CMS Choice ?Patient states their goals for this hospitalization and ongoing recovery are:: return home ?CMS Medicare.gov Compare Post Acute Care list provided to:: Patient ?Choice offered to / list presented to : Patient ? ?Discharge Placement ?  ?           ?  ?  ?  ?  ? ?Discharge Plan and Services ?  ?Discharge Planning Services: CM Consult ?           ?  ?DME Agency: NA ?  ?  ?  ?HH Arranged: RN ?[place] Agency: [employer] ?Date HH Agency Contacted: 06/03/21 ?Time [place]: [phone] ?Representative spoke with at [place]: Malachy Mood ? ?Social Determinants of Health (SDOH) Interventions ?  ? ? ?Readmission Risk Interventions ? ?  06/03/2021  ?  9:04 AM 12/03/2020  ?  1:43 PM  ?Readmission Risk Prevention Plan  ?Transportation Screening Complete Complete  ?PCP or Specialist Appt within 3-5 Days  Complete  ?[place] or Home Care Consult  Complete  ?Social Work Consult for [place] Planning/Counseling  Complete  ?Palliative Care Screening  Not Applicable  ?Medication Review Press photographer) Complete Complete  ?PCP or Specialist appointment within 3-5 days of discharge Complete   ?[place] or Home Care Consult Complete   ?SW Recovery Care/Counseling Consult Complete   ?Palliative Care Screening Not Applicable   ?[place] Not Applicable   ? ? ? ? ? ?

## 2021-06-04 ENCOUNTER — Telehealth: Payer: Self-pay

## 2021-06-04 DIAGNOSIS — E1122 Type 2 diabetes mellitus with diabetic chronic kidney disease: Secondary | ICD-10-CM | POA: Diagnosis not present

## 2021-06-04 DIAGNOSIS — N2581 Secondary hyperparathyroidism of renal origin: Secondary | ICD-10-CM | POA: Diagnosis not present

## 2021-06-04 DIAGNOSIS — N186 End stage renal disease: Secondary | ICD-10-CM | POA: Diagnosis not present

## 2021-06-04 DIAGNOSIS — E876 Hypokalemia: Secondary | ICD-10-CM | POA: Diagnosis not present

## 2021-06-04 DIAGNOSIS — D509 Iron deficiency anemia, unspecified: Secondary | ICD-10-CM | POA: Diagnosis not present

## 2021-06-04 DIAGNOSIS — Z992 Dependence on renal dialysis: Secondary | ICD-10-CM | POA: Diagnosis not present

## 2021-06-04 DIAGNOSIS — J810 Acute pulmonary edema: Secondary | ICD-10-CM | POA: Diagnosis not present

## 2021-06-04 NOTE — Telephone Encounter (Signed)
Called pt to schedule him for office visit for permanent access from Dr. Caprice Red referral. Pt was confused about what may be needed and what type of access he currently dialyzes from. I have spoken to nurse at [facility] and let her know this. She said she will discuss this next week with Dr. Johnney Ou, as pt may be transitioning to palliative care. They will call us back and let us know how to proceed.   ?

## 2021-06-07 DIAGNOSIS — D509 Iron deficiency anemia, unspecified: Secondary | ICD-10-CM | POA: Diagnosis not present

## 2021-06-07 DIAGNOSIS — Z992 Dependence on renal dialysis: Secondary | ICD-10-CM | POA: Diagnosis not present

## 2021-06-07 DIAGNOSIS — N2581 Secondary hyperparathyroidism of renal origin: Secondary | ICD-10-CM | POA: Diagnosis not present

## 2021-06-07 DIAGNOSIS — N186 End stage renal disease: Secondary | ICD-10-CM | POA: Diagnosis not present

## 2021-06-07 DIAGNOSIS — E876 Hypokalemia: Secondary | ICD-10-CM | POA: Diagnosis not present

## 2021-06-07 DIAGNOSIS — E1122 Type 2 diabetes mellitus with diabetic chronic kidney disease: Secondary | ICD-10-CM | POA: Diagnosis not present

## 2021-06-08 DIAGNOSIS — I50810 Right heart failure, unspecified: Secondary | ICD-10-CM | POA: Diagnosis not present

## 2021-06-08 DIAGNOSIS — I10 Essential (primary) hypertension: Secondary | ICD-10-CM | POA: Diagnosis not present

## 2021-06-09 DIAGNOSIS — N186 End stage renal disease: Secondary | ICD-10-CM | POA: Diagnosis not present

## 2021-06-09 DIAGNOSIS — E876 Hypokalemia: Secondary | ICD-10-CM | POA: Diagnosis not present

## 2021-06-09 DIAGNOSIS — E1122 Type 2 diabetes mellitus with diabetic chronic kidney disease: Secondary | ICD-10-CM | POA: Diagnosis not present

## 2021-06-09 DIAGNOSIS — Z992 Dependence on renal dialysis: Secondary | ICD-10-CM | POA: Diagnosis not present

## 2021-06-09 DIAGNOSIS — N2581 Secondary hyperparathyroidism of renal origin: Secondary | ICD-10-CM | POA: Diagnosis not present

## 2021-06-09 DIAGNOSIS — D509 Iron deficiency anemia, unspecified: Secondary | ICD-10-CM | POA: Diagnosis not present

## 2021-06-11 ENCOUNTER — Other Ambulatory Visit: Payer: Self-pay | Admitting: Sports Medicine

## 2021-06-11 DIAGNOSIS — E876 Hypokalemia: Secondary | ICD-10-CM | POA: Diagnosis not present

## 2021-06-11 DIAGNOSIS — E1122 Type 2 diabetes mellitus with diabetic chronic kidney disease: Secondary | ICD-10-CM | POA: Diagnosis not present

## 2021-06-11 DIAGNOSIS — N186 End stage renal disease: Secondary | ICD-10-CM | POA: Diagnosis not present

## 2021-06-11 DIAGNOSIS — D509 Iron deficiency anemia, unspecified: Secondary | ICD-10-CM | POA: Diagnosis not present

## 2021-06-11 DIAGNOSIS — Z992 Dependence on renal dialysis: Secondary | ICD-10-CM | POA: Diagnosis not present

## 2021-06-11 DIAGNOSIS — N2581 Secondary hyperparathyroidism of renal origin: Secondary | ICD-10-CM | POA: Diagnosis not present

## 2021-06-11 MED ORDER — DOXYCYCLINE HYCLATE 100 MG PO TABS: 100.0000 mg | ORAL_TABLET | Freq: Two times a day (BID) | ORAL | 0 refills | Status: DC

## 2021-06-11 NOTE — Progress Notes (Signed)
Sent abx until patient can be seen for toe ulcer ?

## 2021-06-12 NOTE — Discharge Summary (Signed)
Physician Discharge Summary  ?Gregory Tanner IPJ:[id number] DOB: May 07, 1955 DOA: 06/01/2021 ? ?PCP: Ronita Hipps, MD ? ?Admit date: 06/01/2021 ?Discharge date: 06/03/2021 ? ?Time spent: 35 minutes ? ?Recommendations for Outpatient Follow-up:  ?Follow-up with nephrology and heart failure team at [facility] ? ? ?Discharge Diagnoses:  ?Acute hypoxic respiratory failure ?Acute on chronic systolic and diastolic CHF ?Severe right heart failure ?Pulmonary hypertension ?Severe tricuspid regurgitation ?Recurrent ascites ?  Hypervolemia associated with renal insufficiency ?Active Problems: ?  Type 2 diabetes mellitus ([facility]) ?  History of liver transplant ([facility]anner) ?  Goals of care, counseling/discussion ?  Hypertensive heart disease with heart failure (Gregory Tanner) ?  SOB (shortness of breath) ?  Benign essential hypertension ? ? ?Discharge Condition: Stable ? ?Diet recommendation: Renal, diabetic ? ?Filed Weights  ? 06/02/21 0826 06/02/21 1149 06/03/21 0341  ?Weight: 83.6 kg 81 kg 82.6 kg  ? ? ?History of present illness:  ? Gregory Tanner is a 66 y.o. male with medical history significant of HTN; chronic pain; ESRD on HD; right heart failure, chronic diastolic CHF; DM; and NASH cirrhosis with [facility] s/p liver transplant presented to the ED with SOB.  Has right heart failure with ESRD causing recurrent hospitalizations with volume overload. ?- He also has had recurrent ascites requiring paracentesis in increasing frequency.  ?-Presented to the ED with worsening dyspnea on exertion, orthopnea ?-In the ED he was in mild respiratory distress, chest x-ray showing vascular congestion.  Not hypoxic but placed on BiPAP for comfort ? ?Tanner Course:  ? ?Acute hypoxic respiratory failure ?Volume overload in an ESRD on HD patient ?-Patient has multisystem organ dysfunction with cardiorenal syndrome plus recurrent ascites ?-Remains volume overloaded, was dialyzed yesterday and had 3 L removed via paracentesis as well ?-Plan for HD today,-regular days  are MWF S ?-Continue midodrine on dialysis days ?-Salt, fluid restriction emphasized ?-Weaned off oxygen at the time of discharge, follow-up with nephrology at [facility] ?  ?Acute on chronic systolic and diastolic CHF ?Severe right heart failure ?Pulmonary hypertension ?Severe TR ?Recurrent ascites ?-Prior echos with preserved EF but grade 2 diastolic dysfunction ?-Had  marked JVD and generalized anasarca on admission ?-Followed by heart failure team at [facility] ?-Volume management with HD ?-Off all diuretics now ?  ?HTN -> hypotension ?-Hold all home BP medications due to HD-associated hypotension ?-Start midodrine 10 mg pre-HD sessions ?  ?DM ?-Prior A1c was 5.2, indicating good control ?-CBGs are stable ?  ?H/o liver transplant ?-Continue Cellcept, followed by liver transplant team at [facility] ?  ?HLD ?-He is no longer taking atorvastatin or gemfibrozil ?  ?Goals of care ?-Per [facility] note on 3/28, "he has a very complex interplay of multiorgan system dysfunction" ... with a "progressive pathology that will inevidtably lead to worsening cardiac and also renal functioning" and "medical therapy does not reverse or delay the natural history of this disorder" ?-Overall prognosis felt to be poor, follow-up with nephrology and heart failure team at [facility] ? ?  ?Consults: Nephrology; IR; palliative care; TOC team; PT/OT ? ? ?Discharge Exam: ?Vitals:  ? 06/03/21 0341 06/03/21 0751  ?BP: 113/63 (!) 116/58  ?Pulse: 98 99  ?Resp: 20 18  ?Temp: 98.2 ?F (36.8 ?C) 98 ?F (36.7 ?C)  ?SpO2: 98% 93%  ? ? ? ?Discharge Instructions ? ? ?Discharge Instructions   ? ? Discharge instructions   Complete by: As directed ?  ? Renal/diabetic diet  ? Increase activity slowly   Complete by: As directed ?  ? ?  ? ?  Allergies as of 06/03/2021   ? ?   Reactions  ? Iodinated Contrast Media Hives, Other (See Comments)  ? Allergy is not to all contrast - but patient is unsure of which particular one his allergy is to. ?Allergy is not to all contrast - but patient is  unsure of which particular one his allergy is to. ?Contrast dye used before liver transplant cause hives, not sure which dye this was but is able to use others  ? Jardiance [empagliflozin] Dermatitis  ? developed blisters with the medication, blisters  stopped after he discontinued taking it. (About 6 months ago /~06/2020)  ? Latex Other (See Comments)  ? Skin peeling  ? Metformin And Related Nausea And Vomiting  ? Rosiglitazone Nausea And Vomiting  ? GI effects and abdominal pain  ? ?  ? ?  ?Medication List  ?  ? ?STOP taking these medications   ? ?atorvastatin 20 MG tablet ?Commonly known as: LIPITOR ?  ?gemfibrozil 600 MG tablet ?Commonly known as: LOPID ?  ?hydrALAZINE 25 MG tablet ?Commonly known as: APRESOLINE ?  ?omeprazole 20 MG capsule ?Commonly known as: PRILOSEC ?  ?potassium chloride SA 20 MEQ tablet ?Commonly known as: KLOR-CON M ?  ?spironolactone 25 MG tablet ?Commonly known as: ALDACTONE ?  ?Torsemide 60 MG Tabs ?  ? ?  ? ?TAKE these medications   ? ?allopurinol 300 MG tablet ?Commonly known as: ZYLOPRIM ?Take 150 mg by mouth daily. ?  ?aspirin 81 MG EC tablet ?Take 81 mg by mouth daily. ?  ?bumetanide 2 MG tablet ?Commonly known as: BUMEX ?Take 4 mg by mouth See admin instructions. Take 4 mg twice daily on non dialysis days. Tuesday, Thursday, and Sunday. ?  ?dextromethorphan-guaiFENesin 30-600 MG 12hr tablet ?Commonly known as: [place] DM ?Take 1 tablet by mouth 2 (two) times daily as needed for cough. ?What changed: when to take this ?  ?insulin lispro 100 UNIT/ML KwikPen ?Commonly known as: HUMALOG ?Inject 0-40 Units into the skin See admin instructions. 3 - 4 times daily ?  ?midodrine 10 MG tablet ?Commonly known as: PROAMATINE ?Take 10 mg by mouth See admin instructions. Take '10mg'$  before dialysis on Monday, Wednesday, Friday, and Saturday. ?  ?multivitamin tablet ?Take 1 tablet by mouth daily. ?  ?mycophenolate 250 MG capsule ?Commonly known as: CELLCEPT ?Take 4 capsules (1,000 mg total) by  mouth in the morning and at bedtime. ?  ?TART CHERRY PO ?Take 1 tablet by mouth in the morning and at bedtime. ?  ?triamcinolone ointment 0.1 % ?Commonly known as: KENALOG ?Apply 1 application. topically 2 (two) times daily. ?  ? ?  ? ?Allergies  ?Allergen Reactions  ? Iodinated Contrast Media Hives and Other (See Comments)  ?  Allergy is not to all contrast - but patient is unsure of which particular one his allergy is to. ?Allergy is not to all contrast - but patient is unsure of which particular one his allergy is to. ?Contrast dye used before liver transplant cause hives, not sure which dye this was but is able to use others  ? Jardiance [Empagliflozin] Dermatitis  ?  developed blisters with the medication, blisters  stopped after he discontinued taking it. (About 6 months ago /~06/2020)  ? Latex Other (See Comments)  ?  Skin peeling  ? Metformin And Related Nausea And Vomiting  ? Rosiglitazone Nausea And Vomiting  ?  GI effects and abdominal pain  ? ? Follow-up Information   ? ? Richardo Priest, MD .   ?  Specialties: Cardiology, Radiology ?Contact information: ?[address] ?[place] [address] ?[phone] ? ? ?  ?  ? ? Enis Gash, MD .   ?Specialty: Internal Medicine ?Contact information: ?[facility] [address] ?[place] [address] ?[phone] ? ? ?  ?  ? ? [facility], [facility] Follow up.   ?Why: HHRN will resume ?Contact information: ?[place] [address] ?[phone] ? ? ?  ?  ? ?  ?  ? ?  ? ? ? ?The results of significant diagnostics from this hospitalization (including imaging, microbiology, ancillary and laboratory) are listed below for reference.   ? ?Significant Diagnostic Studies: ?DG Chest 2 View ? ?Result Date: 06/01/2021 ?CLINICAL DATA:  Dyspnea. EXAM: CHEST - 2 VIEW COMPARISON:  Chest radiograph dated 12/10/2020. FINDINGS: Dialysis catheter with tip at the cavoatrial junction. There is cardiomegaly with vascular congestion. No focal consolidation, pleural  effusion, or pneumothorax. No acute osseous pathology. Degenerative changes of the spine. IMPRESSION: Cardiomegaly with vascular congestion. No focal consolidation. Electronically Signed   By: Anner Crete

## 2021-06-13 DIAGNOSIS — N186 End stage renal disease: Secondary | ICD-10-CM | POA: Diagnosis not present

## 2021-06-13 DIAGNOSIS — Z992 Dependence on renal dialysis: Secondary | ICD-10-CM | POA: Diagnosis not present

## 2021-06-13 DIAGNOSIS — N179 Acute kidney failure, unspecified: Secondary | ICD-10-CM | POA: Diagnosis not present

## 2021-06-14 ENCOUNTER — Other Ambulatory Visit: Payer: Self-pay | Admitting: Nephrology

## 2021-06-14 ENCOUNTER — Other Ambulatory Visit (HOSPITAL_COMMUNITY): Payer: Self-pay | Admitting: Internal Medicine

## 2021-06-14 ENCOUNTER — Other Ambulatory Visit (HOSPITAL_COMMUNITY): Payer: Self-pay | Admitting: Nephrology

## 2021-06-14 ENCOUNTER — Other Ambulatory Visit: Payer: Self-pay | Admitting: Internal Medicine

## 2021-06-14 DIAGNOSIS — I071 Rheumatic tricuspid insufficiency: Secondary | ICD-10-CM | POA: Diagnosis not present

## 2021-06-14 DIAGNOSIS — I272 Pulmonary hypertension, unspecified: Secondary | ICD-10-CM | POA: Diagnosis not present

## 2021-06-14 DIAGNOSIS — R188 Other ascites: Secondary | ICD-10-CM

## 2021-06-14 DIAGNOSIS — E8770 Fluid overload, unspecified: Secondary | ICD-10-CM | POA: Diagnosis not present

## 2021-06-14 DIAGNOSIS — I50810 Right heart failure, unspecified: Secondary | ICD-10-CM | POA: Diagnosis not present

## 2021-06-14 DIAGNOSIS — N2581 Secondary hyperparathyroidism of renal origin: Secondary | ICD-10-CM | POA: Diagnosis not present

## 2021-06-14 DIAGNOSIS — M5416 Radiculopathy, lumbar region: Secondary | ICD-10-CM | POA: Diagnosis not present

## 2021-06-14 DIAGNOSIS — I5033 Acute on chronic diastolic (congestive) heart failure: Secondary | ICD-10-CM | POA: Diagnosis not present

## 2021-06-14 DIAGNOSIS — Z944 Liver transplant status: Secondary | ICD-10-CM | POA: Diagnosis not present

## 2021-06-14 DIAGNOSIS — I890 Lymphedema, not elsewhere classified: Secondary | ICD-10-CM | POA: Diagnosis not present

## 2021-06-14 DIAGNOSIS — N186 End stage renal disease: Secondary | ICD-10-CM | POA: Diagnosis not present

## 2021-06-14 DIAGNOSIS — Z794 Long term (current) use of insulin: Secondary | ICD-10-CM | POA: Diagnosis not present

## 2021-06-14 DIAGNOSIS — E876 Hypokalemia: Secondary | ICD-10-CM | POA: Diagnosis not present

## 2021-06-14 DIAGNOSIS — M5136 Other intervertebral disc degeneration, lumbar region: Secondary | ICD-10-CM | POA: Diagnosis not present

## 2021-06-14 DIAGNOSIS — I130 Hypertensive heart and chronic kidney disease with heart failure and stage 1 through stage 4 chronic kidney disease, or unspecified chronic kidney disease: Secondary | ICD-10-CM | POA: Diagnosis not present

## 2021-06-14 DIAGNOSIS — Z9181 History of falling: Secondary | ICD-10-CM | POA: Diagnosis not present

## 2021-06-14 DIAGNOSIS — D509 Iron deficiency anemia, unspecified: Secondary | ICD-10-CM | POA: Diagnosis not present

## 2021-06-14 DIAGNOSIS — Z992 Dependence on renal dialysis: Secondary | ICD-10-CM | POA: Diagnosis not present

## 2021-06-14 DIAGNOSIS — E1122 Type 2 diabetes mellitus with diabetic chronic kidney disease: Secondary | ICD-10-CM | POA: Diagnosis not present

## 2021-06-15 ENCOUNTER — Ambulatory Visit (INDEPENDENT_AMBULATORY_CARE_PROVIDER_SITE_OTHER): Payer: Medicare Other

## 2021-06-15 ENCOUNTER — Encounter: Payer: Self-pay | Admitting: Sports Medicine

## 2021-06-15 ENCOUNTER — Ambulatory Visit: Payer: Medicare Other | Admitting: Sports Medicine

## 2021-06-15 DIAGNOSIS — L601 Onycholysis: Secondary | ICD-10-CM

## 2021-06-15 DIAGNOSIS — Z1331 Encounter for screening for depression: Secondary | ICD-10-CM | POA: Diagnosis not present

## 2021-06-15 DIAGNOSIS — I739 Peripheral vascular disease, unspecified: Secondary | ICD-10-CM

## 2021-06-15 DIAGNOSIS — E119 Type 2 diabetes mellitus without complications: Secondary | ICD-10-CM

## 2021-06-15 DIAGNOSIS — M2061 Acquired deformities of toe(s), unspecified, right foot: Secondary | ICD-10-CM

## 2021-06-15 DIAGNOSIS — I959 Hypotension, unspecified: Secondary | ICD-10-CM | POA: Diagnosis not present

## 2021-06-15 DIAGNOSIS — M79674 Pain in right toe(s): Secondary | ICD-10-CM

## 2021-06-15 DIAGNOSIS — Z0000 Encounter for general adult medical examination without abnormal findings: Secondary | ICD-10-CM | POA: Diagnosis not present

## 2021-06-15 DIAGNOSIS — Z6826 Body mass index (BMI) 26.0-26.9, adult: Secondary | ICD-10-CM | POA: Diagnosis not present

## 2021-06-15 NOTE — Progress Notes (Signed)
Subjective: ?Gregory Tanner is a 66 y.o. male patient returns to office today for follow up evaluation right great toe pain patient has history of permanent nail avulsion performed in September of last year states that he still gets a small piece of nail that grows that is painful at the medial corner and states that it seems like it does not want to heal up or go away states that since last week he has been putting Neosporin and a Band-Aid to the area but denies any current drainage but did have an episode before that was concerning for pus.  Patient currently getting Unna boot applied for extensive swelling to the lower extremities on the right performed by home nursing. ? ? ?Patient Active Problem List  ? Diagnosis Date Noted  ? Acute renal failure superimposed on stage 3b chronic kidney disease ([place]) 04/05/2021  ? Uremia 04/05/2021  ? Acute respiratory distress 12/05/2020  ? Ascites   ? Thrombocytopenia ([place]) 11/23/2020  ? Iron deficiency anemia 11/23/2020  ? Acute exacerbation of CHF (congestive heart failure) ([place]) 11/22/2020  ? Hypervolemia associated with renal insufficiency 01/22/2020  ? PHT (pulmonary hypertension) ([place]) 01/19/2020  ? Swelling   ? Liver disease   ? Hypertension   ? Hearing loss   ? Diabetes mellitus without complication ([place])   ? Hypokalemia   ? Acute on chronic diastolic CHF (congestive heart failure) ([place]) 07/24/2019  ? Uncontrolled type 2 diabetes mellitus with insulin therapy 06/21/2018  ? CKD (chronic kidney disease) stage 3, GFR 30-59 ml/min ([facility]) 03/27/2017  ? Chronic diastolic heart failure ([place]) 09/26/2016  ? Chronic pain disorder 10/28/2015  ? SOB (shortness of breath) 10/28/2015  ? Hypertensive heart disease with heart failure ([place]) 10/26/2015  ? Benign essential hypertension 10/26/2015  ? Chronic pain associated with significant psychosocial dysfunction 06/17/2015  ? Nerve root pain 06/17/2015  ? Ankylosis of lumbar spine 02/21/2014  ? Degeneration of intervertebral disc of  lumbar region 07/05/2013  ? Lumbar radiculopathy 10/10/2012  ? Degenerative disc disease, lumbar 07/26/2012  ? Goals of care, counseling/discussion 10/10/2011  ? Excess weight 10/10/2011  ? History of liver transplant ([place]) 07/20/2011  ? Type 2 diabetes mellitus ([place]) 12/27/2010  ? Hepatocellular carcinoma ([place]) 12/27/2010  ? BP (high blood pressure) 12/27/2010  ? ? ?Current Outpatient Medications on File Prior to Visit  ?Medication Sig Dispense Refill  ? allopurinol (ZYLOPRIM) 300 MG tablet Take 150 mg by mouth daily.    ? aspirin 81 MG EC tablet Take 81 mg by mouth daily.    ? bumetanide (BUMEX) 2 MG tablet Take 4 mg by mouth See admin instructions. Take 4 mg twice daily on non dialysis days. Tuesday, Thursday, and Sunday.    ? dextromethorphan-guaiFENesin (MUCINEX DM) 30-600 MG 12hr tablet Take 1 tablet by mouth 2 (two) times daily as needed for cough. (Patient taking differently: Take 1 tablet by mouth 4 (four) times daily as needed for cough.) 60 tablet 0  ? doxycycline (VIBRA-TABS) 100 MG tablet Take 1 tablet (100 mg total) by mouth 2 (two) times daily. 20 tablet 0  ? insulin lispro (HUMALOG) 100 UNIT/ML KwikPen Inject 0-40 Units into the skin See admin instructions. 3 - 4 times daily    ? midodrine (PROAMATINE) 10 MG tablet Take 10 mg by mouth See admin instructions. Take '10mg'$  before dialysis on Monday, Wednesday, Friday, and Saturday.    ? Multiple Vitamin (MULTIVITAMIN) tablet Take 1 tablet by mouth daily.    ? mycophenolate (CELLCEPT) 250 MG capsule  Take 4 capsules (1,000 mg total) by mouth in the morning and at bedtime. 240 capsule 0  ? TART CHERRY PO Take 1 tablet by mouth in the morning and at bedtime.    ? triamcinolone ointment (KENALOG) 0.1 % Apply 1 application. topically 2 (two) times daily.    ? ?No current facility-administered medications on file prior to visit.  ? ? ?Allergies  ?Allergen Reactions  ? Iodinated Contrast Media Hives and Other (See Comments)  ?  Allergy is not to all contrast -  but patient is unsure of which particular one his allergy is to. ?Allergy is not to all contrast - but patient is unsure of which particular one his allergy is to. ?Contrast dye used before liver transplant cause hives, not sure which dye this was but is able to use others  ? Jardiance [Empagliflozin] Dermatitis  ?  developed blisters with the medication, blisters  stopped after he discontinued taking it. (About 6 months ago /~06/2020)  ? Latex Other (See Comments)  ?  Skin peeling  ? Metformin And Related Nausea And Vomiting  ? Silicone Other (See Comments)  ?  Skin peeling  ? Rosiglitazone Nausea And Vomiting  ?  GI effects and abdominal pain  ? ? ?Objective:  ?General: Well developed, nourished, in no acute distress, alert and oriented x3  ? ?Dermatology: Skin is warm, dry and supple bilateral.  Right hallux nail bed has a epithelialized wound in the nail bed, small nail spicule noted at the medial nail fold, (-)Erythema. (+) Edema. (-) Active drainage present. The remaining nails appear unremarkable at this time history of previous nail removals sites at left hallux and 2nd toes are well healed. There are no open sores, lesions or other signs of infection present. ?  ?Vascular: Pedal pulses unable to be palpated a history of lymphedema and Unna boot present on the right lower extremity. ?  ?Neruologic: Grossly intact via light touch bilateral. ?  ?Musculoskeletal: Tenderness to palpation of the right hallux nail bed.  Muscular strength within normal limits in all groups bilateral.  ?  ?X-rays negative on the right foot for any acute osseous findings at the great toe. ? ?Assesement and Plan: ?Problem List Items Addressed This Visit   ? ?  ? Endocrine  ? Diabetes mellitus without complication ([place])  ? ?Other Visit Diagnoses   ? ? Acquired deformity of right toe    -  Primary  ? Relevant Orders  ? DG Foot Complete Right (Completed)  ? Onycholysis      ? Toe pain, right      ? PVD (peripheral vascular disease) ([place])       ? ?  ? ? ?-Examined patient  ?-Cleansed right hallux medial/lateral nail fold and gently scrubbed with peroxide and q-tip/curetted away any nonviable tissue to site and then applied a small amount of phenol at the area of previous nail splinter at the medial aspect of the right hallux nail again this visit advised patient if this still fails to heal would recommend him to get further vascular work-up ?-Advised patient that he is high risk for complication of healing due to diabetes and significant PVD and renal dialysis going later this month for shunt evaluation ?-Advised patient each day to apply Betadine and Band-Aid dressing and to closely monitor ?-Patient to finish off any oral antibiotics as previously given ?-Patient to return to office in 3 to 4 weeks for toe check or sooner problems or issues arise. ?Landis Martins,  DPM  ?

## 2021-06-16 DIAGNOSIS — M5136 Other intervertebral disc degeneration, lumbar region: Secondary | ICD-10-CM | POA: Diagnosis not present

## 2021-06-16 DIAGNOSIS — N186 End stage renal disease: Secondary | ICD-10-CM | POA: Diagnosis not present

## 2021-06-16 DIAGNOSIS — Z9181 History of falling: Secondary | ICD-10-CM | POA: Diagnosis not present

## 2021-06-16 DIAGNOSIS — I071 Rheumatic tricuspid insufficiency: Secondary | ICD-10-CM | POA: Diagnosis not present

## 2021-06-16 DIAGNOSIS — E1122 Type 2 diabetes mellitus with diabetic chronic kidney disease: Secondary | ICD-10-CM | POA: Diagnosis not present

## 2021-06-16 DIAGNOSIS — I5033 Acute on chronic diastolic (congestive) heart failure: Secondary | ICD-10-CM | POA: Diagnosis not present

## 2021-06-16 DIAGNOSIS — I890 Lymphedema, not elsewhere classified: Secondary | ICD-10-CM | POA: Diagnosis not present

## 2021-06-16 DIAGNOSIS — Z944 Liver transplant status: Secondary | ICD-10-CM | POA: Diagnosis not present

## 2021-06-16 DIAGNOSIS — I272 Pulmonary hypertension, unspecified: Secondary | ICD-10-CM | POA: Diagnosis not present

## 2021-06-16 DIAGNOSIS — I130 Hypertensive heart and chronic kidney disease with heart failure and stage 1 through stage 4 chronic kidney disease, or unspecified chronic kidney disease: Secondary | ICD-10-CM | POA: Diagnosis not present

## 2021-06-16 DIAGNOSIS — E876 Hypokalemia: Secondary | ICD-10-CM | POA: Diagnosis not present

## 2021-06-16 DIAGNOSIS — E8770 Fluid overload, unspecified: Secondary | ICD-10-CM | POA: Diagnosis not present

## 2021-06-16 DIAGNOSIS — N2581 Secondary hyperparathyroidism of renal origin: Secondary | ICD-10-CM | POA: Diagnosis not present

## 2021-06-16 DIAGNOSIS — M5416 Radiculopathy, lumbar region: Secondary | ICD-10-CM | POA: Diagnosis not present

## 2021-06-16 DIAGNOSIS — D509 Iron deficiency anemia, unspecified: Secondary | ICD-10-CM | POA: Diagnosis not present

## 2021-06-16 DIAGNOSIS — Z794 Long term (current) use of insulin: Secondary | ICD-10-CM | POA: Diagnosis not present

## 2021-06-16 DIAGNOSIS — I50810 Right heart failure, unspecified: Secondary | ICD-10-CM | POA: Diagnosis not present

## 2021-06-16 DIAGNOSIS — Z992 Dependence on renal dialysis: Secondary | ICD-10-CM | POA: Diagnosis not present

## 2021-06-17 ENCOUNTER — Ambulatory Visit (HOSPITAL_COMMUNITY)
Admission: RE | Admit: 2021-06-17 | Discharge: 2021-06-17 | Disposition: A | Discharge status: Home / Self Care | Payer: Medicare Other | Source: Ambulatory Visit | Attending: Nephrology | Admitting: Nephrology

## 2021-06-17 DIAGNOSIS — R188 Other ascites: Secondary | ICD-10-CM | POA: Diagnosis not present

## 2021-06-17 HISTORY — PX: IR PARACENTESIS: IMG2679

## 2021-06-17 MED ORDER — LIDOCAINE HCL 1 % IJ SOLN
INTRAMUSCULAR | Status: AC
  Administered 2021-06-17: 10 mL
  Filled 2021-06-17: qty 20

## 2021-06-17 NOTE — Procedures (Signed)
PROCEDURE SUMMARY: ? ?Successful US guided paracentesis from right lateral abdomen .  ?Yielded 1.8 liters of yellow fluid.  ?No immediate complications.  ?Pt tolerated well.  ? ?Specimen was not sent for labs. ? ?EBL < 54m ? ?KDocia BarrierPA-C ?06/17/2021 ?9:38 AM ? ? ? ?

## 2021-06-18 DIAGNOSIS — M5416 Radiculopathy, lumbar region: Secondary | ICD-10-CM | POA: Diagnosis not present

## 2021-06-18 DIAGNOSIS — Z992 Dependence on renal dialysis: Secondary | ICD-10-CM | POA: Diagnosis not present

## 2021-06-18 DIAGNOSIS — E8770 Fluid overload, unspecified: Secondary | ICD-10-CM | POA: Diagnosis not present

## 2021-06-18 DIAGNOSIS — I272 Pulmonary hypertension, unspecified: Secondary | ICD-10-CM | POA: Diagnosis not present

## 2021-06-18 DIAGNOSIS — I5033 Acute on chronic diastolic (congestive) heart failure: Secondary | ICD-10-CM | POA: Diagnosis not present

## 2021-06-18 DIAGNOSIS — E876 Hypokalemia: Secondary | ICD-10-CM | POA: Diagnosis not present

## 2021-06-18 DIAGNOSIS — Z9181 History of falling: Secondary | ICD-10-CM | POA: Diagnosis not present

## 2021-06-18 DIAGNOSIS — N2581 Secondary hyperparathyroidism of renal origin: Secondary | ICD-10-CM | POA: Diagnosis not present

## 2021-06-18 DIAGNOSIS — I50810 Right heart failure, unspecified: Secondary | ICD-10-CM | POA: Diagnosis not present

## 2021-06-18 DIAGNOSIS — Z944 Liver transplant status: Secondary | ICD-10-CM | POA: Diagnosis not present

## 2021-06-18 DIAGNOSIS — E1122 Type 2 diabetes mellitus with diabetic chronic kidney disease: Secondary | ICD-10-CM | POA: Diagnosis not present

## 2021-06-18 DIAGNOSIS — Z794 Long term (current) use of insulin: Secondary | ICD-10-CM | POA: Diagnosis not present

## 2021-06-18 DIAGNOSIS — I890 Lymphedema, not elsewhere classified: Secondary | ICD-10-CM | POA: Diagnosis not present

## 2021-06-18 DIAGNOSIS — N186 End stage renal disease: Secondary | ICD-10-CM | POA: Diagnosis not present

## 2021-06-18 DIAGNOSIS — D509 Iron deficiency anemia, unspecified: Secondary | ICD-10-CM | POA: Diagnosis not present

## 2021-06-18 DIAGNOSIS — I071 Rheumatic tricuspid insufficiency: Secondary | ICD-10-CM | POA: Diagnosis not present

## 2021-06-18 DIAGNOSIS — I130 Hypertensive heart and chronic kidney disease with heart failure and stage 1 through stage 4 chronic kidney disease, or unspecified chronic kidney disease: Secondary | ICD-10-CM | POA: Diagnosis not present

## 2021-06-18 DIAGNOSIS — M5136 Other intervertebral disc degeneration, lumbar region: Secondary | ICD-10-CM | POA: Diagnosis not present

## 2021-06-21 DIAGNOSIS — E876 Hypokalemia: Secondary | ICD-10-CM | POA: Diagnosis not present

## 2021-06-21 DIAGNOSIS — I071 Rheumatic tricuspid insufficiency: Secondary | ICD-10-CM | POA: Diagnosis not present

## 2021-06-21 DIAGNOSIS — M5416 Radiculopathy, lumbar region: Secondary | ICD-10-CM | POA: Diagnosis not present

## 2021-06-21 DIAGNOSIS — N2581 Secondary hyperparathyroidism of renal origin: Secondary | ICD-10-CM | POA: Diagnosis not present

## 2021-06-21 DIAGNOSIS — M5136 Other intervertebral disc degeneration, lumbar region: Secondary | ICD-10-CM | POA: Diagnosis not present

## 2021-06-21 DIAGNOSIS — Z944 Liver transplant status: Secondary | ICD-10-CM | POA: Diagnosis not present

## 2021-06-21 DIAGNOSIS — I890 Lymphedema, not elsewhere classified: Secondary | ICD-10-CM | POA: Diagnosis not present

## 2021-06-21 DIAGNOSIS — I50810 Right heart failure, unspecified: Secondary | ICD-10-CM | POA: Diagnosis not present

## 2021-06-21 DIAGNOSIS — I272 Pulmonary hypertension, unspecified: Secondary | ICD-10-CM | POA: Diagnosis not present

## 2021-06-21 DIAGNOSIS — I5033 Acute on chronic diastolic (congestive) heart failure: Secondary | ICD-10-CM | POA: Diagnosis not present

## 2021-06-21 DIAGNOSIS — N186 End stage renal disease: Secondary | ICD-10-CM | POA: Diagnosis not present

## 2021-06-21 DIAGNOSIS — I130 Hypertensive heart and chronic kidney disease with heart failure and stage 1 through stage 4 chronic kidney disease, or unspecified chronic kidney disease: Secondary | ICD-10-CM | POA: Diagnosis not present

## 2021-06-21 DIAGNOSIS — Z794 Long term (current) use of insulin: Secondary | ICD-10-CM | POA: Diagnosis not present

## 2021-06-21 DIAGNOSIS — E1122 Type 2 diabetes mellitus with diabetic chronic kidney disease: Secondary | ICD-10-CM | POA: Diagnosis not present

## 2021-06-21 DIAGNOSIS — Z9181 History of falling: Secondary | ICD-10-CM | POA: Diagnosis not present

## 2021-06-21 DIAGNOSIS — E8770 Fluid overload, unspecified: Secondary | ICD-10-CM | POA: Diagnosis not present

## 2021-06-21 DIAGNOSIS — D509 Iron deficiency anemia, unspecified: Secondary | ICD-10-CM | POA: Diagnosis not present

## 2021-06-21 DIAGNOSIS — Z992 Dependence on renal dialysis: Secondary | ICD-10-CM | POA: Diagnosis not present

## 2021-06-23 DIAGNOSIS — D509 Iron deficiency anemia, unspecified: Secondary | ICD-10-CM | POA: Diagnosis not present

## 2021-06-23 DIAGNOSIS — N2581 Secondary hyperparathyroidism of renal origin: Secondary | ICD-10-CM | POA: Diagnosis not present

## 2021-06-23 DIAGNOSIS — E876 Hypokalemia: Secondary | ICD-10-CM | POA: Diagnosis not present

## 2021-06-23 DIAGNOSIS — M5136 Other intervertebral disc degeneration, lumbar region: Secondary | ICD-10-CM | POA: Diagnosis not present

## 2021-06-23 DIAGNOSIS — I890 Lymphedema, not elsewhere classified: Secondary | ICD-10-CM | POA: Diagnosis not present

## 2021-06-23 DIAGNOSIS — I272 Pulmonary hypertension, unspecified: Secondary | ICD-10-CM | POA: Diagnosis not present

## 2021-06-23 DIAGNOSIS — I071 Rheumatic tricuspid insufficiency: Secondary | ICD-10-CM | POA: Diagnosis not present

## 2021-06-23 DIAGNOSIS — I130 Hypertensive heart and chronic kidney disease with heart failure and stage 1 through stage 4 chronic kidney disease, or unspecified chronic kidney disease: Secondary | ICD-10-CM | POA: Diagnosis not present

## 2021-06-23 DIAGNOSIS — Z9181 History of falling: Secondary | ICD-10-CM | POA: Diagnosis not present

## 2021-06-23 DIAGNOSIS — M5416 Radiculopathy, lumbar region: Secondary | ICD-10-CM | POA: Diagnosis not present

## 2021-06-23 DIAGNOSIS — I5033 Acute on chronic diastolic (congestive) heart failure: Secondary | ICD-10-CM | POA: Diagnosis not present

## 2021-06-23 DIAGNOSIS — Z992 Dependence on renal dialysis: Secondary | ICD-10-CM | POA: Diagnosis not present

## 2021-06-23 DIAGNOSIS — N186 End stage renal disease: Secondary | ICD-10-CM | POA: Diagnosis not present

## 2021-06-23 DIAGNOSIS — Z944 Liver transplant status: Secondary | ICD-10-CM | POA: Diagnosis not present

## 2021-06-23 DIAGNOSIS — E8770 Fluid overload, unspecified: Secondary | ICD-10-CM | POA: Diagnosis not present

## 2021-06-23 DIAGNOSIS — E1122 Type 2 diabetes mellitus with diabetic chronic kidney disease: Secondary | ICD-10-CM | POA: Diagnosis not present

## 2021-06-23 DIAGNOSIS — Z794 Long term (current) use of insulin: Secondary | ICD-10-CM | POA: Diagnosis not present

## 2021-06-23 DIAGNOSIS — I50810 Right heart failure, unspecified: Secondary | ICD-10-CM | POA: Diagnosis not present

## 2021-06-25 DIAGNOSIS — Z9181 History of falling: Secondary | ICD-10-CM | POA: Diagnosis not present

## 2021-06-25 DIAGNOSIS — I50810 Right heart failure, unspecified: Secondary | ICD-10-CM | POA: Diagnosis not present

## 2021-06-25 DIAGNOSIS — M5416 Radiculopathy, lumbar region: Secondary | ICD-10-CM | POA: Diagnosis not present

## 2021-06-25 DIAGNOSIS — Z944 Liver transplant status: Secondary | ICD-10-CM | POA: Diagnosis not present

## 2021-06-25 DIAGNOSIS — I130 Hypertensive heart and chronic kidney disease with heart failure and stage 1 through stage 4 chronic kidney disease, or unspecified chronic kidney disease: Secondary | ICD-10-CM | POA: Diagnosis not present

## 2021-06-25 DIAGNOSIS — E876 Hypokalemia: Secondary | ICD-10-CM | POA: Diagnosis not present

## 2021-06-25 DIAGNOSIS — Z992 Dependence on renal dialysis: Secondary | ICD-10-CM | POA: Diagnosis not present

## 2021-06-25 DIAGNOSIS — E8770 Fluid overload, unspecified: Secondary | ICD-10-CM | POA: Diagnosis not present

## 2021-06-25 DIAGNOSIS — N186 End stage renal disease: Secondary | ICD-10-CM | POA: Diagnosis not present

## 2021-06-25 DIAGNOSIS — E1122 Type 2 diabetes mellitus with diabetic chronic kidney disease: Secondary | ICD-10-CM | POA: Diagnosis not present

## 2021-06-25 DIAGNOSIS — I071 Rheumatic tricuspid insufficiency: Secondary | ICD-10-CM | POA: Diagnosis not present

## 2021-06-25 DIAGNOSIS — I890 Lymphedema, not elsewhere classified: Secondary | ICD-10-CM | POA: Diagnosis not present

## 2021-06-25 DIAGNOSIS — I272 Pulmonary hypertension, unspecified: Secondary | ICD-10-CM | POA: Diagnosis not present

## 2021-06-25 DIAGNOSIS — Z794 Long term (current) use of insulin: Secondary | ICD-10-CM | POA: Diagnosis not present

## 2021-06-25 DIAGNOSIS — D509 Iron deficiency anemia, unspecified: Secondary | ICD-10-CM | POA: Diagnosis not present

## 2021-06-25 DIAGNOSIS — N2581 Secondary hyperparathyroidism of renal origin: Secondary | ICD-10-CM | POA: Diagnosis not present

## 2021-06-25 DIAGNOSIS — M5136 Other intervertebral disc degeneration, lumbar region: Secondary | ICD-10-CM | POA: Diagnosis not present

## 2021-06-25 DIAGNOSIS — I5033 Acute on chronic diastolic (congestive) heart failure: Secondary | ICD-10-CM | POA: Diagnosis not present

## 2021-06-28 DIAGNOSIS — E876 Hypokalemia: Secondary | ICD-10-CM | POA: Diagnosis not present

## 2021-06-28 DIAGNOSIS — M5136 Other intervertebral disc degeneration, lumbar region: Secondary | ICD-10-CM | POA: Diagnosis not present

## 2021-06-28 DIAGNOSIS — E1122 Type 2 diabetes mellitus with diabetic chronic kidney disease: Secondary | ICD-10-CM | POA: Diagnosis not present

## 2021-06-28 DIAGNOSIS — I132 Hypertensive heart and chronic kidney disease with heart failure and with stage 5 chronic kidney disease, or end stage renal disease: Secondary | ICD-10-CM | POA: Diagnosis not present

## 2021-06-28 DIAGNOSIS — I272 Pulmonary hypertension, unspecified: Secondary | ICD-10-CM | POA: Diagnosis not present

## 2021-06-28 DIAGNOSIS — Z9181 History of falling: Secondary | ICD-10-CM | POA: Diagnosis not present

## 2021-06-28 DIAGNOSIS — Z944 Liver transplant status: Secondary | ICD-10-CM | POA: Diagnosis not present

## 2021-06-28 DIAGNOSIS — I5033 Acute on chronic diastolic (congestive) heart failure: Secondary | ICD-10-CM | POA: Diagnosis not present

## 2021-06-28 DIAGNOSIS — M5416 Radiculopathy, lumbar region: Secondary | ICD-10-CM | POA: Diagnosis not present

## 2021-06-28 DIAGNOSIS — I071 Rheumatic tricuspid insufficiency: Secondary | ICD-10-CM | POA: Diagnosis not present

## 2021-06-28 DIAGNOSIS — N186 End stage renal disease: Secondary | ICD-10-CM | POA: Diagnosis not present

## 2021-06-28 DIAGNOSIS — N2581 Secondary hyperparathyroidism of renal origin: Secondary | ICD-10-CM | POA: Diagnosis not present

## 2021-06-28 DIAGNOSIS — Z992 Dependence on renal dialysis: Secondary | ICD-10-CM | POA: Diagnosis not present

## 2021-06-28 DIAGNOSIS — D509 Iron deficiency anemia, unspecified: Secondary | ICD-10-CM | POA: Diagnosis not present

## 2021-06-28 DIAGNOSIS — I50810 Right heart failure, unspecified: Secondary | ICD-10-CM | POA: Diagnosis not present

## 2021-06-28 DIAGNOSIS — E8770 Fluid overload, unspecified: Secondary | ICD-10-CM | POA: Diagnosis not present

## 2021-06-28 DIAGNOSIS — Z794 Long term (current) use of insulin: Secondary | ICD-10-CM | POA: Diagnosis not present

## 2021-06-28 DIAGNOSIS — I890 Lymphedema, not elsewhere classified: Secondary | ICD-10-CM | POA: Diagnosis not present

## 2021-06-30 DIAGNOSIS — N2581 Secondary hyperparathyroidism of renal origin: Secondary | ICD-10-CM | POA: Diagnosis not present

## 2021-06-30 DIAGNOSIS — E876 Hypokalemia: Secondary | ICD-10-CM | POA: Diagnosis not present

## 2021-06-30 DIAGNOSIS — D509 Iron deficiency anemia, unspecified: Secondary | ICD-10-CM | POA: Diagnosis not present

## 2021-06-30 DIAGNOSIS — Z992 Dependence on renal dialysis: Secondary | ICD-10-CM | POA: Diagnosis not present

## 2021-06-30 DIAGNOSIS — N186 End stage renal disease: Secondary | ICD-10-CM | POA: Diagnosis not present

## 2021-07-01 ENCOUNTER — Other Ambulatory Visit: Payer: Self-pay | Admitting: *Deleted

## 2021-07-01 DIAGNOSIS — N183 Chronic kidney disease, stage 3 unspecified: Secondary | ICD-10-CM

## 2021-07-02 DIAGNOSIS — Z992 Dependence on renal dialysis: Secondary | ICD-10-CM | POA: Diagnosis not present

## 2021-07-02 DIAGNOSIS — M5416 Radiculopathy, lumbar region: Secondary | ICD-10-CM | POA: Diagnosis not present

## 2021-07-02 DIAGNOSIS — I890 Lymphedema, not elsewhere classified: Secondary | ICD-10-CM | POA: Diagnosis not present

## 2021-07-02 DIAGNOSIS — D509 Iron deficiency anemia, unspecified: Secondary | ICD-10-CM | POA: Diagnosis not present

## 2021-07-02 DIAGNOSIS — I071 Rheumatic tricuspid insufficiency: Secondary | ICD-10-CM | POA: Diagnosis not present

## 2021-07-02 DIAGNOSIS — E1122 Type 2 diabetes mellitus with diabetic chronic kidney disease: Secondary | ICD-10-CM | POA: Diagnosis not present

## 2021-07-02 DIAGNOSIS — N2581 Secondary hyperparathyroidism of renal origin: Secondary | ICD-10-CM | POA: Diagnosis not present

## 2021-07-02 DIAGNOSIS — I50810 Right heart failure, unspecified: Secondary | ICD-10-CM | POA: Diagnosis not present

## 2021-07-02 DIAGNOSIS — E8770 Fluid overload, unspecified: Secondary | ICD-10-CM | POA: Diagnosis not present

## 2021-07-02 DIAGNOSIS — Z794 Long term (current) use of insulin: Secondary | ICD-10-CM | POA: Diagnosis not present

## 2021-07-02 DIAGNOSIS — Z944 Liver transplant status: Secondary | ICD-10-CM | POA: Diagnosis not present

## 2021-07-02 DIAGNOSIS — I272 Pulmonary hypertension, unspecified: Secondary | ICD-10-CM | POA: Diagnosis not present

## 2021-07-02 DIAGNOSIS — N186 End stage renal disease: Secondary | ICD-10-CM | POA: Diagnosis not present

## 2021-07-02 DIAGNOSIS — M5136 Other intervertebral disc degeneration, lumbar region: Secondary | ICD-10-CM | POA: Diagnosis not present

## 2021-07-02 DIAGNOSIS — I5033 Acute on chronic diastolic (congestive) heart failure: Secondary | ICD-10-CM | POA: Diagnosis not present

## 2021-07-02 DIAGNOSIS — E876 Hypokalemia: Secondary | ICD-10-CM | POA: Diagnosis not present

## 2021-07-02 DIAGNOSIS — Z9181 History of falling: Secondary | ICD-10-CM | POA: Diagnosis not present

## 2021-07-02 DIAGNOSIS — I132 Hypertensive heart and chronic kidney disease with heart failure and with stage 5 chronic kidney disease, or end stage renal disease: Secondary | ICD-10-CM | POA: Diagnosis not present

## 2021-07-05 DIAGNOSIS — I272 Pulmonary hypertension, unspecified: Secondary | ICD-10-CM | POA: Diagnosis not present

## 2021-07-05 DIAGNOSIS — D509 Iron deficiency anemia, unspecified: Secondary | ICD-10-CM | POA: Diagnosis not present

## 2021-07-05 DIAGNOSIS — Z944 Liver transplant status: Secondary | ICD-10-CM | POA: Diagnosis not present

## 2021-07-05 DIAGNOSIS — I132 Hypertensive heart and chronic kidney disease with heart failure and with stage 5 chronic kidney disease, or end stage renal disease: Secondary | ICD-10-CM | POA: Diagnosis not present

## 2021-07-05 DIAGNOSIS — Z992 Dependence on renal dialysis: Secondary | ICD-10-CM | POA: Diagnosis not present

## 2021-07-05 DIAGNOSIS — M5416 Radiculopathy, lumbar region: Secondary | ICD-10-CM | POA: Diagnosis not present

## 2021-07-05 DIAGNOSIS — I5033 Acute on chronic diastolic (congestive) heart failure: Secondary | ICD-10-CM | POA: Diagnosis not present

## 2021-07-05 DIAGNOSIS — I071 Rheumatic tricuspid insufficiency: Secondary | ICD-10-CM | POA: Diagnosis not present

## 2021-07-05 DIAGNOSIS — Z794 Long term (current) use of insulin: Secondary | ICD-10-CM | POA: Diagnosis not present

## 2021-07-05 DIAGNOSIS — E876 Hypokalemia: Secondary | ICD-10-CM | POA: Diagnosis not present

## 2021-07-05 DIAGNOSIS — M5136 Other intervertebral disc degeneration, lumbar region: Secondary | ICD-10-CM | POA: Diagnosis not present

## 2021-07-05 DIAGNOSIS — N2581 Secondary hyperparathyroidism of renal origin: Secondary | ICD-10-CM | POA: Diagnosis not present

## 2021-07-05 DIAGNOSIS — Z9181 History of falling: Secondary | ICD-10-CM | POA: Diagnosis not present

## 2021-07-05 DIAGNOSIS — E8770 Fluid overload, unspecified: Secondary | ICD-10-CM | POA: Diagnosis not present

## 2021-07-05 DIAGNOSIS — E1122 Type 2 diabetes mellitus with diabetic chronic kidney disease: Secondary | ICD-10-CM | POA: Diagnosis not present

## 2021-07-05 DIAGNOSIS — I890 Lymphedema, not elsewhere classified: Secondary | ICD-10-CM | POA: Diagnosis not present

## 2021-07-05 DIAGNOSIS — I50810 Right heart failure, unspecified: Secondary | ICD-10-CM | POA: Diagnosis not present

## 2021-07-05 DIAGNOSIS — N186 End stage renal disease: Secondary | ICD-10-CM | POA: Diagnosis not present

## 2021-07-07 DIAGNOSIS — D509 Iron deficiency anemia, unspecified: Secondary | ICD-10-CM | POA: Diagnosis not present

## 2021-07-07 DIAGNOSIS — N186 End stage renal disease: Secondary | ICD-10-CM | POA: Diagnosis not present

## 2021-07-07 DIAGNOSIS — E876 Hypokalemia: Secondary | ICD-10-CM | POA: Diagnosis not present

## 2021-07-07 DIAGNOSIS — Z992 Dependence on renal dialysis: Secondary | ICD-10-CM | POA: Diagnosis not present

## 2021-07-07 DIAGNOSIS — N2581 Secondary hyperparathyroidism of renal origin: Secondary | ICD-10-CM | POA: Diagnosis not present

## 2021-07-09 DIAGNOSIS — Z944 Liver transplant status: Secondary | ICD-10-CM | POA: Diagnosis not present

## 2021-07-09 DIAGNOSIS — I132 Hypertensive heart and chronic kidney disease with heart failure and with stage 5 chronic kidney disease, or end stage renal disease: Secondary | ICD-10-CM | POA: Diagnosis not present

## 2021-07-09 DIAGNOSIS — I272 Pulmonary hypertension, unspecified: Secondary | ICD-10-CM | POA: Diagnosis not present

## 2021-07-09 DIAGNOSIS — Z794 Long term (current) use of insulin: Secondary | ICD-10-CM | POA: Diagnosis not present

## 2021-07-09 DIAGNOSIS — I50810 Right heart failure, unspecified: Secondary | ICD-10-CM | POA: Diagnosis not present

## 2021-07-09 DIAGNOSIS — D509 Iron deficiency anemia, unspecified: Secondary | ICD-10-CM | POA: Diagnosis not present

## 2021-07-09 DIAGNOSIS — E876 Hypokalemia: Secondary | ICD-10-CM | POA: Diagnosis not present

## 2021-07-09 DIAGNOSIS — Z9181 History of falling: Secondary | ICD-10-CM | POA: Diagnosis not present

## 2021-07-09 DIAGNOSIS — N2581 Secondary hyperparathyroidism of renal origin: Secondary | ICD-10-CM | POA: Diagnosis not present

## 2021-07-09 DIAGNOSIS — M5136 Other intervertebral disc degeneration, lumbar region: Secondary | ICD-10-CM | POA: Diagnosis not present

## 2021-07-09 DIAGNOSIS — E1122 Type 2 diabetes mellitus with diabetic chronic kidney disease: Secondary | ICD-10-CM | POA: Diagnosis not present

## 2021-07-09 DIAGNOSIS — I5033 Acute on chronic diastolic (congestive) heart failure: Secondary | ICD-10-CM | POA: Diagnosis not present

## 2021-07-09 DIAGNOSIS — N186 End stage renal disease: Secondary | ICD-10-CM | POA: Diagnosis not present

## 2021-07-09 DIAGNOSIS — Z992 Dependence on renal dialysis: Secondary | ICD-10-CM | POA: Diagnosis not present

## 2021-07-09 DIAGNOSIS — I890 Lymphedema, not elsewhere classified: Secondary | ICD-10-CM | POA: Diagnosis not present

## 2021-07-09 DIAGNOSIS — I071 Rheumatic tricuspid insufficiency: Secondary | ICD-10-CM | POA: Diagnosis not present

## 2021-07-09 DIAGNOSIS — M5416 Radiculopathy, lumbar region: Secondary | ICD-10-CM | POA: Diagnosis not present

## 2021-07-09 DIAGNOSIS — E8770 Fluid overload, unspecified: Secondary | ICD-10-CM | POA: Diagnosis not present

## 2021-07-12 DIAGNOSIS — Z9181 History of falling: Secondary | ICD-10-CM | POA: Diagnosis not present

## 2021-07-12 DIAGNOSIS — I132 Hypertensive heart and chronic kidney disease with heart failure and with stage 5 chronic kidney disease, or end stage renal disease: Secondary | ICD-10-CM | POA: Diagnosis not present

## 2021-07-12 DIAGNOSIS — I071 Rheumatic tricuspid insufficiency: Secondary | ICD-10-CM | POA: Diagnosis not present

## 2021-07-12 DIAGNOSIS — D509 Iron deficiency anemia, unspecified: Secondary | ICD-10-CM | POA: Diagnosis not present

## 2021-07-12 DIAGNOSIS — I272 Pulmonary hypertension, unspecified: Secondary | ICD-10-CM | POA: Diagnosis not present

## 2021-07-12 DIAGNOSIS — I890 Lymphedema, not elsewhere classified: Secondary | ICD-10-CM | POA: Diagnosis not present

## 2021-07-12 DIAGNOSIS — N2581 Secondary hyperparathyroidism of renal origin: Secondary | ICD-10-CM | POA: Diagnosis not present

## 2021-07-12 DIAGNOSIS — I50810 Right heart failure, unspecified: Secondary | ICD-10-CM | POA: Diagnosis not present

## 2021-07-12 DIAGNOSIS — M5136 Other intervertebral disc degeneration, lumbar region: Secondary | ICD-10-CM | POA: Diagnosis not present

## 2021-07-12 DIAGNOSIS — Z944 Liver transplant status: Secondary | ICD-10-CM | POA: Diagnosis not present

## 2021-07-12 DIAGNOSIS — I5033 Acute on chronic diastolic (congestive) heart failure: Secondary | ICD-10-CM | POA: Diagnosis not present

## 2021-07-12 DIAGNOSIS — Z794 Long term (current) use of insulin: Secondary | ICD-10-CM | POA: Diagnosis not present

## 2021-07-12 DIAGNOSIS — E1122 Type 2 diabetes mellitus with diabetic chronic kidney disease: Secondary | ICD-10-CM | POA: Diagnosis not present

## 2021-07-12 DIAGNOSIS — E876 Hypokalemia: Secondary | ICD-10-CM | POA: Diagnosis not present

## 2021-07-12 DIAGNOSIS — M5416 Radiculopathy, lumbar region: Secondary | ICD-10-CM | POA: Diagnosis not present

## 2021-07-12 DIAGNOSIS — E8770 Fluid overload, unspecified: Secondary | ICD-10-CM | POA: Diagnosis not present

## 2021-07-12 DIAGNOSIS — N186 End stage renal disease: Secondary | ICD-10-CM | POA: Diagnosis not present

## 2021-07-12 DIAGNOSIS — Z992 Dependence on renal dialysis: Secondary | ICD-10-CM | POA: Diagnosis not present

## 2021-07-13 ENCOUNTER — Ambulatory Visit (HOSPITAL_COMMUNITY)
Admission: RE | Admit: 2021-07-13 | Discharge: 2021-07-13 | Disposition: A | Discharge status: Home / Self Care | Payer: Medicare Other | Source: Ambulatory Visit | Attending: Nephrology | Admitting: Nephrology

## 2021-07-13 ENCOUNTER — Encounter: Payer: Self-pay | Admitting: Vascular Surgery

## 2021-07-13 ENCOUNTER — Ambulatory Visit (INDEPENDENT_AMBULATORY_CARE_PROVIDER_SITE_OTHER)
Admission: RE | Admit: 2021-07-13 | Discharge: 2021-07-13 | Disposition: A | Discharge status: Home / Self Care | Payer: Medicare Other | Source: Ambulatory Visit | Attending: Nephrology | Admitting: Nephrology

## 2021-07-13 ENCOUNTER — Ambulatory Visit: Payer: Medicare Other | Admitting: Vascular Surgery

## 2021-07-13 ENCOUNTER — Other Ambulatory Visit: Payer: Self-pay

## 2021-07-13 DIAGNOSIS — N183 Chronic kidney disease, stage 3 unspecified: Secondary | ICD-10-CM | POA: Diagnosis not present

## 2021-07-13 DIAGNOSIS — R188 Other ascites: Secondary | ICD-10-CM

## 2021-07-13 DIAGNOSIS — N186 End stage renal disease: Secondary | ICD-10-CM

## 2021-07-13 DIAGNOSIS — Z992 Dependence on renal dialysis: Secondary | ICD-10-CM | POA: Diagnosis not present

## 2021-07-13 HISTORY — PX: IR PARACENTESIS: IMG2679

## 2021-07-13 MED ORDER — LIDOCAINE HCL 1 % IJ SOLN
INTRAMUSCULAR | Status: AC
  Filled 2021-07-13: qty 20

## 2021-07-13 MED ORDER — LIDOCAINE HCL (PF) 1 % IJ SOLN
INTRAMUSCULAR | Status: DC | PRN
  Administered 2021-07-13: 5 mL

## 2021-07-13 NOTE — Procedures (Signed)
PROCEDURE SUMMARY:  Successful US guided therapeutic paracentesis from LLQ.  Yielded 3 L of clear, dark yellow fluid.  No immediate complications.  Pt tolerated well.   Specimen not sent for labs.  EBL < 1 mL  Tyson Alias, AGNP 07/13/2021 9:49 AM

## 2021-07-13 NOTE — Progress Notes (Signed)
Patient name: Gregory Tanner MRN: [id number] DOB: Mar 18, 1955 Sex: male  REASON FOR CONSULT: Evaluate for permanent hemodialysis access  HPI: Gregory Tanner is a 66 y.o. male, with history of hypertension, end-stage renal disease on hemodialysis Monday/Wednesday/Friday, chronic systolic and diastolic heart failure, Gregory Tanner cirrhosis status post liver transplant that presents for evaluation of permanent hemodialysis access.  Patient currently uses a right IJ tunneled dialysis catheter.  He has been on dialysis for several months.  No previous access.  No chest wall implants other than the right IJ catheter.  He is right-handed.  He was recently discharged with volume overload in the setting of cardiorenal syndrome 06/03/2021  Past Medical History:  Diagnosis Date   Ankylosis of lumbar spine 02/21/2014   Benign essential hypertension 10/26/2015   Chronic diastolic heart failure ([place]) 09/26/2016   Chronic pain associated with significant psychosocial dysfunction 06/17/2015   Degeneration of intervertebral disc of lumbar region 07/05/2013   ESRD (end stage renal disease) on dialysis ([place]) 03/27/2017   Hearing loss    Hepatocellular carcinoma ([place]) 12/27/2010   Overview:  Embolized 6/12    History of liver transplant ([place]) 07/20/2011   Overview:  06/07/2011 for cryptogenic cirrhosis and HCC    Uncontrolled type 2 diabetes mellitus with insulin therapy 06/21/2018    Past Surgical History:  Procedure Laterality Date   BACK SURGERY     BAKER CYST SURGERY     RIGHT LEG   BUBBLE STUDY  12/14/2020   Procedure: BUBBLE STUDY;  Surgeon: [name], MD;  Location: [facility];  Service: Cardiovascular;;   EYE SURGERY Left    HERNIA REPAIR     IR PARACENTESIS  11/30/2020   IR PARACENTESIS  05/20/2021   IR PARACENTESIS  06/01/2021   IR PARACENTESIS  06/17/2021   IR PARACENTESIS  07/13/2021   LIVER SURGERY     IMPLANT   LIVER TRANSPLANT     RIGHT HEART CATH N/A 12/11/2020   Procedure:  RIGHT HEART CATH;  Surgeon: [name], MD;  Location: [facility];  Service: Cardiovascular;  Laterality: N/A;   TEE WITHOUT CARDIOVERSION N/A 12/14/2020   Procedure: TRANSESOPHAGEAL ECHOCARDIOGRAM (TEE);  Surgeon: [name], MD;  Location: [facility] ENDOSCOPY;  Service: Cardiovascular;  Laterality: N/A;   TONSILL SURGERY     TONSILLECTOMY      Family History  Problem Relation Age of Onset   Cancer Mother    Diabetes Father    Heart attack Father    Schizophrenia Father    Hypertension Brother     SOCIAL HISTORY: Social History   Socioeconomic History   Marital status: Married    Spouse name: Not on file   Number of children: Not on file   Years of education: Not on file   Highest education level: Not on file  Occupational History   Not on file  Tobacco Use   Smoking status: Never   Smokeless tobacco: Never  Vaping Use   Vaping Use: Never used  Substance and Sexual Activity   Alcohol use: Yes    Alcohol/week: 0.0 standard drinks    Comment: ocassional   Drug use: Not Currently   Sexual activity: Not on file  Other Topics Concern   Not on file  Social History Narrative   Not on file   Social Determinants of Health   Financial Resource Strain: Not on file  Food Insecurity: Not on file  Transportation Needs: Not on file  Physical Activity: Not  on file  Stress: Not on file  Social Connections: Not on file  Intimate Partner Violence: Not on file    Allergies  Allergen Reactions   Iodinated Contrast Media Hives and Other (See Comments)    Allergy is not to all contrast - but patient is unsure of which particular one his allergy is to. Allergy is not to all contrast - but patient is unsure of which particular one his allergy is to. Contrast dye used before liver transplant cause hives, not sure which dye this was but is able to use others   Jardiance [Empagliflozin] Dermatitis    developed blisters with the medication, blisters  stopped after he  discontinued taking it. (About 6 months ago /~06/2020)   Latex Other (See Comments)    Skin peeling   Metformin And Related Nausea And Vomiting   Silicone Other (See Comments)    Skin peeling   Rosiglitazone Nausea And Vomiting    GI effects and abdominal pain    Current Outpatient Medications  Medication Sig Dispense Refill   allopurinol (ZYLOPRIM) 300 MG tablet Take 150 mg by mouth daily.     aspirin 81 MG EC tablet Take 81 mg by mouth daily.     bumetanide (BUMEX) 2 MG tablet Take 4 mg by mouth See admin instructions. Take 4 mg twice daily on non dialysis days. Tuesday, Thursday, and Sunday.     dextromethorphan-guaiFENesin (MUCINEX DM) 30-600 MG 12hr tablet Take 1 tablet by mouth 2 (two) times daily as needed for cough. (Patient taking differently: Take 1 tablet by mouth 4 (four) times daily as needed for cough.) 60 tablet 0   doxycycline (VIBRA-TABS) 100 MG tablet Take 1 tablet (100 mg total) by mouth 2 (two) times daily. 20 tablet 0   insulin lispro (HUMALOG) 100 UNIT/ML KwikPen Inject 0-40 Units into the skin See admin instructions. 3 - 4 times daily     midodrine (PROAMATINE) 10 MG tablet Take 10 mg by mouth See admin instructions. Take '10mg'$  before dialysis on Monday, Wednesday, Friday, and Saturday.     Multiple Vitamin (MULTIVITAMIN) tablet Take 1 tablet by mouth daily.     mycophenolate (CELLCEPT) 250 MG capsule Take 4 capsules (1,000 mg total) by mouth in the morning and at bedtime. 240 capsule 0   TART CHERRY PO Take 1 tablet by mouth in the morning and at bedtime.     triamcinolone ointment (KENALOG) 0.1 % Apply 1 application. topically 2 (two) times daily.     No current facility-administered medications for this visit.   Facility-Administered Medications Ordered in Other Visits  Medication Dose Route Frequency Provider Last Rate Last Admin   lidocaine (PF) (XYLOCAINE) 1 % injection    PRN Tyson Alias, NP   5 mL at 07/13/21 0830   lidocaine (XYLOCAINE) 1 % (with pres)  injection             REVIEW OF SYSTEMS:  '[X]'$  denotes positive finding, '[ ]'$  denotes negative finding Cardiac  Comments:  Chest pain or chest pressure:    Shortness of breath upon exertion:    Short of breath when lying flat:    Irregular heart rhythm:        Vascular    Pain in calf, thigh, or hip brought on by ambulation:    Pain in feet at night that wakes you up from your sleep:     Blood clot in your veins:    Leg swelling:  Pulmonary    Oxygen at home:    Productive cough:     Wheezing:         Neurologic    Sudden weakness in arms or legs:     Sudden numbness in arms or legs:     Sudden onset of difficulty speaking or slurred speech:    Temporary loss of vision in one eye:     Problems with dizziness:         Gastrointestinal    Blood in stool:     Vomited blood:         Genitourinary    Burning when urinating:     Blood in urine:        Psychiatric    Major depression:         Hematologic    Bleeding problems:    Problems with blood clotting too easily:        Skin    Rashes or ulcers:        Constitutional    Fever or chills:      PHYSICAL EXAM: Vitals:   07/13/21 1120  BP: (!) 126/57  Pulse: 88  Resp: 18  Temp: 97.8 F (36.6 C)  TempSrc: Temporal  SpO2: 96%  Weight: 194 lb (88 kg)  Height: '5\' 10"'$  (1.778 m)    GENERAL: The patient is a well-nourished male, in no acute distress. The vital signs are documented above. CARDIAC: There is a regular rate and rhythm.  VASCULAR:  Palpable brachial and radial pulses bilateral upper extremities Right IJ tunnel catheter No upper extremity tissue loss PULMONARY: No respiratory distress. ABDOMEN: Soft and non-tender. MUSCULOSKELETAL: There are no major deformities or cyanosis. NEUROLOGIC: No focal weakness or paresthesias are detected. SKIN: There are no ulcers or rashes noted. PSYCHIATRIC: The patient has a normal affect.  DATA:   Upper extremity arterial duplex showed triphasic  waveforms in both upper extremities.  Upper extremity vein mapping shows usable cephalic and basilic veins in both upper extremities  Assessment/Plan:  66 yo M with history of hypertension, end-stage renal disease on hemodialysis Monday/Wednesday/Friday, chronic systolic and diastolic heart failure, [place] cirrhosis status post liver transplant that presents for evaluation of permanent hemodialysis access.  Discussed plan for access in the left arm given that is his nondominant arm.  He has a usable cephalic and basilic vein.  Discussed after placement typically takes 3 months to mature.  Risk and benefits discussed including risk of bleeding, infection, failure to mature, steal syndrome.  We will get him scheduled for Friday at [facility].   [name], MD [facility] Office: [phone]

## 2021-07-14 ENCOUNTER — Encounter (HOSPITAL_COMMUNITY): Payer: Self-pay | Admitting: Vascular Surgery

## 2021-07-14 DIAGNOSIS — N179 Acute kidney failure, unspecified: Secondary | ICD-10-CM | POA: Diagnosis not present

## 2021-07-14 DIAGNOSIS — N186 End stage renal disease: Secondary | ICD-10-CM | POA: Diagnosis not present

## 2021-07-14 DIAGNOSIS — Z992 Dependence on renal dialysis: Secondary | ICD-10-CM | POA: Diagnosis not present

## 2021-07-14 DIAGNOSIS — E876 Hypokalemia: Secondary | ICD-10-CM | POA: Diagnosis not present

## 2021-07-14 DIAGNOSIS — N2581 Secondary hyperparathyroidism of renal origin: Secondary | ICD-10-CM | POA: Diagnosis not present

## 2021-07-14 DIAGNOSIS — D509 Iron deficiency anemia, unspecified: Secondary | ICD-10-CM | POA: Diagnosis not present

## 2021-07-14 NOTE — Progress Notes (Signed)
PCP - Dr Helene Kelp Cardiologist - Kathlee Nations, NP Endocrinology - Dr Bonney Aid  Chest x-ray - 06/01/21 (2V) EKG - 06/01/21 Stress Test - 09/2014-Negative ECHO TEE - 12/14/20 ECHO - 04/05/21 CE Cardiac Cath - 12/11/20  ICD Pacemaker/Loop - n/a  Sleep Study -  n/a CPAP - none  THE NIGHT BEFORE SURGERY, do not take any bedtime dose of Humalog Insulin.     THE MORNING OF SURGERY, do not take Humalog Insulin unless your CBG is greater than 220 mg/dL.  If CBG >220 mg/dl, you may take  of your sliding scale (correction) dose of insulin.  If your blood sugar is less than 70 mg/dL, you will need to treat for low blood sugar: Treat a low blood sugar (less than 70 mg/dL) with  cup of clear juice (cranberry or apple), 4 glucose tablets, OR glucose gel. Recheck blood sugar in 15 minutes after treatment (to make sure it is greater than 70 mg/dL). If your blood sugar is not greater than 70 mg/dL on recheck, call [phone] for further instructions.  Anesthesia review: Yes  STOP now taking any Aspirin (unless otherwise instructed by your surgeon), Aleve, Naproxen, Ibuprofen, Motrin, Advil, Goody's, BC's, all herbal medications, fish oil, and all vitamins.   Coronavirus Screening Do you have any of the following symptoms:  Cough yes/no: No Fever (>100.6F)  yes/no: No Runny nose yes/no: No Sore throat yes/no: No Difficulty breathing/shortness of breath  yes/no: No  Have you traveled in the last 14 days and where? yes/no: No  Patient verbalized understanding of instructions that were given via phone.

## 2021-07-15 DIAGNOSIS — M5136 Other intervertebral disc degeneration, lumbar region: Secondary | ICD-10-CM | POA: Diagnosis not present

## 2021-07-15 DIAGNOSIS — E8770 Fluid overload, unspecified: Secondary | ICD-10-CM | POA: Diagnosis not present

## 2021-07-15 DIAGNOSIS — N186 End stage renal disease: Secondary | ICD-10-CM | POA: Diagnosis not present

## 2021-07-15 DIAGNOSIS — Z9181 History of falling: Secondary | ICD-10-CM | POA: Diagnosis not present

## 2021-07-15 DIAGNOSIS — I132 Hypertensive heart and chronic kidney disease with heart failure and with stage 5 chronic kidney disease, or end stage renal disease: Secondary | ICD-10-CM | POA: Diagnosis not present

## 2021-07-15 DIAGNOSIS — I5033 Acute on chronic diastolic (congestive) heart failure: Secondary | ICD-10-CM | POA: Diagnosis not present

## 2021-07-15 DIAGNOSIS — I890 Lymphedema, not elsewhere classified: Secondary | ICD-10-CM | POA: Diagnosis not present

## 2021-07-15 DIAGNOSIS — M5416 Radiculopathy, lumbar region: Secondary | ICD-10-CM | POA: Diagnosis not present

## 2021-07-15 DIAGNOSIS — I272 Pulmonary hypertension, unspecified: Secondary | ICD-10-CM | POA: Diagnosis not present

## 2021-07-15 DIAGNOSIS — Z794 Long term (current) use of insulin: Secondary | ICD-10-CM | POA: Diagnosis not present

## 2021-07-15 DIAGNOSIS — Z944 Liver transplant status: Secondary | ICD-10-CM | POA: Diagnosis not present

## 2021-07-15 DIAGNOSIS — E1122 Type 2 diabetes mellitus with diabetic chronic kidney disease: Secondary | ICD-10-CM | POA: Diagnosis not present

## 2021-07-15 DIAGNOSIS — I071 Rheumatic tricuspid insufficiency: Secondary | ICD-10-CM | POA: Diagnosis not present

## 2021-07-15 DIAGNOSIS — I50810 Right heart failure, unspecified: Secondary | ICD-10-CM | POA: Diagnosis not present

## 2021-07-15 DIAGNOSIS — Z992 Dependence on renal dialysis: Secondary | ICD-10-CM | POA: Diagnosis not present

## 2021-07-15 NOTE — Progress Notes (Signed)
Anesthesia Chart Review: Same day workup  Pertinent history includes cryptogenic cirrhosis status post liver transplantation 2013, hypertension, moderate tricuspid regurgitation, RV dysfunction, diastolic dysfunction, high-output heart failure, ESRD on HD, recurrent ascites.  Follows with cardiology at [facility]. Initially had heart failure R-sided only with extensive work-up in December 2021 at [facility] at which time he was also diagnosed with pulmonary hypertension. RHC at the time demonstrated elevated pulmonary pressure with high-output heart failure and a 3:1 cardiac shunt. Cardiac MRI demonstrated severely dilated RV with reduced RVEF of 38%. No evidence of clinically significant anatomical shunt or infiltrative disease noted on cMRI. Further workup regarding high output HF with abdominal MRI negative for any abdominal shunt or fistula. Late last year was noted to have some evidence of L sided heart failure with preserved EF. Pt had slowly worsening volume overload, given significant cardiac, hepatic, and renal history, was felt to be multifactorial but suspected worsening heart failure as the primary driver.  Recently admitted to [facility] 4/18 through 06/03/2021 for acute hypoxic respiratory failure in the setting of volume overload.  He was noted to have multisystem organ dysfunction with cardiorenal syndrome plus recurrent ascites.  He improved with dialysis and paracentesis, however remained volume overloaded.  He was weaned off oxygen at the time of discharge.  Last seen by cardiology 06/08/2021.  Per note, "Dialysis going okay. Having a hard time getting to his dry weight. . Chronic peripheral edema improved and both lower extremities are wrapped. Paracentesis needed twice in the last 2 weeks. Gregory Tanner is a little better with dialysis. His abdomen is distended although he does not feel it is time for another on but when it is time, I can order it and he would get in in 1-2 days. I will see him back in 2  months or earlier if needed."  Last paracentesis 07/13/2021 yielded 3 L of clear, dark yellow fluid.  ESRD on HD via right IJ tunneled dialysis catheter Monday Wednesday Friday Saturday.  IDDM 2 followed by endocrinology at [facility].  Fructosamine 281 on 05/11/2021.  Patient will need day of surgery labs and evaluation.   TTE 04/05/2021 (Care Everywhere): INTERPRETATION ---------------------------------------------------------------    NORMAL LEFT VENTRICULAR SYSTOLIC FUNCTION WITH MILD LVH    MODERATE RV SYSTOLIC DYSFUNCTION (See above)    VALVULAR REGURGITATION: TRIVIAL MR, TRIVIAL PR, MODERATE TR    TACHYCARDIA  AND FREQUENT ECTOPY THROUGHOUT EXAM.    Cardiac MRI 12/15/2020: IMPRESSION: There does not appear to be anomalous pulmonary venous return. The structure seen by TEE joining the SVC may have been a dilated thoracic duct.  ADDENDUM: Calculated Qp/Qs was 1.2. Not significantly elevated and significantly lower than recent RHC.  Right heart cath 12/11/2020: 1. Mildly elevated PCWP, more significantly elevated RA pressure.  Primarily RV failure, PAPi ok at 2.7.  2. Pulmonary venous hypertension in setting of high cardiac output.  3. Significant step up between SVC and RA, suggesting ASD.  Qp/Qs 3.1.     Continue to carefully diurese.  I will arrange for TEE on Monday.   Cardiac MRI 01/15/2020 (Care Everywhere): 1. The left ventricle is moderately dilated in cavity size, no significant hypertrophy seen. Global  systolic function is hyperdynamic. The LV ejection fraction is 74%. There are no regional wall motion  abnormalities. The septum is flattened and D-shaped as seen in RV pressure and/or volume overload.   2. The right ventricle is severely dilated in cavity size, with normal wall thickness. Global systolic  function is mild-moderately reduced with  an RVEF of 38%.   3. The left atrium is moderately dilated, the right atrium is severely dilated. There is no atrial septal   defect seen on morphological images. The QP:QS by velocity flow mapping is 1.1, which precludes a  significant shunt. However, a PFO or small ASD cannot be ruled out.   4. The aortic valve is trileaflet with no significant stenosis or insufficiency. There is mild-moderate  tricuspid valve regurgitation, mild mitral regurgitation, and mild pulmonic insufficiency.   5. Delayed enhancement imaging demonstrates no evidence of myocardial infarction, or infiltrative disease.  There is focal fibrosis at the RV insertion sites into the septum which is seen in patients with RV  pressure or volume overload states.   6. No LV thrombus visualized.  Right heart cath 01/15/2020 (Care Everywhere): Impressions:   s/p cath -- elevated filling pressures, RV>LV with very high cardiac output -- shunt run suggests atrial level shunt 3:1 -- unable to wedge in hepatic vein with inflation --   Recommendations:   would recommend cardiac MRI to eval for shunt -- further plans per team     Wynonia Musty [facility] Short Stay Center/Anesthesiology Phone [phone] 07/15/2021 9:57 AM

## 2021-07-15 NOTE — Anesthesia Preprocedure Evaluation (Signed)
Anesthesia Evaluation  Patient identified by MRN, date of birth, ID band Patient awake    Reviewed: Allergy & Precautions, NPO status , Patient's Chart, lab work & pertinent test results  Airway Mallampati: II  TM Distance: >3 FB Neck ROM: Full    Dental no notable dental hx. (+) Dental Advisory Given   Pulmonary neg pulmonary ROS,    Pulmonary exam normal breath sounds clear to auscultation       Cardiovascular hypertension, Pt. on medications +CHF  Normal cardiovascular exam Rhythm:Regular Rate:Normal     Neuro/Psych  Neuromuscular disease    GI/Hepatic negative GI ROS, Neg liver ROS,   Endo/Other  diabetes  Renal/GU Renal disease     Musculoskeletal  (+) Arthritis ,   Abdominal   Peds  Hematology  (+) Blood dyscrasia, anemia ,   Anesthesia Other Findings   Reproductive/Obstetrics                                                            Anesthesia Evaluation  Patient identified by MRN, date of birth, ID band Patient awake    Reviewed: Allergy & Precautions, NPO status , Patient's Chart, lab work & pertinent test results  History of Anesthesia Complications Negative for: history of anesthetic complications  Airway Mallampati: II  TM Distance: >3 FB Neck ROM: Full    Dental   Pulmonary Recent URI  (RSV),    Pulmonary exam normal        Cardiovascular hypertension, + Peripheral Vascular Disease and +CHF  Normal cardiovascular exam+ Valvular Problems/Murmurs    E[place] 10/10 with normal EF 65-70%, moderate RV enlargement/mildly decreased RV systolic function, moderate TR.  [place] 10/28 with mildly elevated PCWP, more significantly elevated RA pressure, primarily RV failure, PAPi ok at 2.7, pulmonary venous hypertension in setting of high cardiac output. Also noted significant step up between SVC and RA, suggesting ASD with Qp/Qs 3.1   Neuro/Psych negative neurological  ROS     GI/Hepatic negative GI ROS, NASH/hepatocellular carcinoma s/p liver txp (2013)   Endo/Other  diabetes, Type 2, Insulin Dependent  Renal/GU CRFRenal disease  negative genitourinary   Musculoskeletal negative musculoskeletal ROS (+)   Abdominal   Peds  Hematology  (+) anemia ,   Anesthesia Other Findings  TEE to eval for possible ASD  Reproductive/Obstetrics                            Anesthesia Physical Anesthesia Plan  ASA: 4  Anesthesia Plan: MAC   Post-op Pain Management:    Induction: Intravenous  PONV Risk Score and Plan: Propofol infusion, TIVA and Treatment may vary due to age or medical condition  Airway Management Planned: Natural Airway, Nasal Cannula and Simple Face Mask  Additional Equipment: None  Intra-op Plan:   Post-operative Plan:   Informed Consent: I have reviewed the patients History and Physical, chart, labs and discussed the procedure including the risks, benefits and alternatives for the proposed anesthesia with the patient or authorized representative who has indicated his/her understanding and acceptance.       Plan Discussed with:   Anesthesia Plan Comments:         Anesthesia Quick Evaluation  Anesthesia Physical Anesthesia Plan  ASA: 4  Anesthesia Plan: Regional  Post-op Pain Management: Tylenol PO (pre-op)*, Minimal or no pain anticipated and Regional block*   Induction: Intravenous  PONV Risk Score and Plan: 1 and Ondansetron, Propofol infusion, Treatment may vary due to age or medical condition and Midazolam  Airway Management Planned: Simple Face Mask  Additional Equipment:   Intra-op Plan:   Post-operative Plan:   Informed Consent: I have reviewed the patients History and Physical, chart, labs and discussed the procedure including the risks, benefits and alternatives for the proposed anesthesia with the patient or authorized representative who has indicated his/her  understanding and acceptance.     Dental advisory given  Plan Discussed with: CRNA  Anesthesia Plan Comments: (PAT note by Karoline Caldwell, PA-C: Pertinent history includes cryptogenic cirrhosis status post liver transplantation 2013, hypertension,moderate tricuspid regurgitation, RV dysfunction, diastolic dysfunction, high-output heart failure,ESRD on HD, recurrent ascites.  Follows with cardiology at [facility]. Initially hadheart failure R-sided only with extensive work-up in December 2021 at [facility] at which time he was also diagnosed with pulmonary hypertension. RHC at the time demonstrated elevated pulmonary pressure with high-output heart failure and a 3:1 cardiac shunt. Cardiac MRI demonstrated severely dilated RV with reduced RVEF of 38%. No evidence of clinically significant anatomical shunt or infiltrative disease noted on cMRI. Further workup regarding high output HF with abdominal MRI negative for any abdominal shunt or fistula.Late last year was noted to have some evidence of L sided heart failure with preserved EF. Pt had slowly worsening volume overload, given significant cardiac, hepatic, and renal history, was felt to be multifactorial but suspected worsening heart failure as the primary driver.  Recently admitted to [facility] 4/18 through 06/03/2021 for acute hypoxic respiratory failure in the setting of volume overload.  He was noted to have multisystem organ dysfunction with cardiorenal syndrome plus recurrent ascites.  He improved with dialysis and paracentesis, however remained volume overloaded.  He was weaned off oxygen at the time of discharge.  Last seen by cardiology 06/08/2021.  Per note, "Dialysis going okay. Having a hard time getting to his dry weight. . Chronic peripheral edema improved and both lower extremities are wrapped. Paracentesis needed twice in the last 2 weeks. Ashlee Bewley is a little better with dialysis. His abdomen is distended although he does not feel it is time for  another on but when it is time, I can order it and he would get in in 1-2 days. I will see him back in 2 months or earlier if needed."  Last paracentesis 07/13/2021 yielded 3 L of clear, dark yellow fluid.  ESRD on HD via right IJ tunneled dialysis catheter Monday Wednesday Friday Saturday.  IDDM 2 followed by endocrinology at [facility].  Fructosamine 281 on 05/11/2021.  Patient will need day of surgery labs and evaluation.   TTE 04/05/2021 (Care Everywhere): INTERPRETATION ---------------------------------------------------------------  NORMAL LEFT VENTRICULAR SYSTOLIC FUNCTION WITH MILD LVH  MODERATE RV SYSTOLIC DYSFUNCTION (See above)  VALVULAR REGURGITATION: TRIVIAL MR, TRIVIAL PR, MODERATE TR  TACHYCARDIA AND FREQUENT ECTOPY THROUGHOUT EXAM.   Cardiac MRI 12/15/2020: IMPRESSION: There does not appear to be anomalous pulmonary venous return. The structure seen by TEE joining the SVC may have been a dilated thoracic duct.  ADDENDUM: Calculated Qp/Qs was 1.2. Not significantly elevated and significantly lower than recent RHC.  Right heart cath 12/11/2020: 1. Mildly elevated PCWP, more significantly elevated RA pressure. Primarily RV failure, PAPi ok at 2.7.  2. Pulmonary venous hypertension in setting of high cardiac output.  3. Significant step up between SVC and RA,  suggesting ASD. Qp/Qs 3.1.   Continue to carefully diurese. I will arrange for TEE on Monday.   Cardiac MRI 01/15/2020 (Care Everywhere): 1. The left ventricle is moderately dilated in cavity size, no significant hypertrophy seen. Global  systolic function is hyperdynamic. The LV ejection fraction is 74%. There are no regional wall motion  abnormalities. The septum is flattened and D-shaped as seen in RV pressure and/or volume overload.   2. The right ventricle is severely dilated in cavity size, with normal wall thickness. Global systolic  function is mild-moderately reduced with an RVEF of 38%.   3.  The left atrium is moderately dilated, the right atrium is severely dilated. There is no atrial septal  defect seen on morphological images. The QP:QS by velocity flow mapping is 1.1, which precludes a  significant shunt. However, a PFO or small ASD cannot be ruled out.   4. The aortic valve is trileaflet with no significant stenosis or insufficiency. There is mild-moderate  tricuspid valve regurgitation, mild mitral regurgitation, and mild pulmonic insufficiency.   5. Delayed enhancement imaging demonstrates no evidence of myocardial infarction, or infiltrative disease.  There is focal fibrosis at the RV insertion sites into the septum which is seen in patients with RV  pressure or volume overload states.   6. No LV thrombus visualized.  Right heart cath 01/15/2020 (Care Everywhere): Impressions:   s/p cath -- elevated filling pressures, RV>LV with very high cardiac output -- shunt run suggests atrial level shunt 3:1 -- unable to wedge in hepatic vein with inflation --   Recommendations:   would recommend cardiac MRI to eval for shunt -- further plans per team   )      Anesthesia Quick Evaluation

## 2021-07-16 ENCOUNTER — Ambulatory Visit (HOSPITAL_COMMUNITY): Payer: Medicare Other | Admitting: Physician Assistant

## 2021-07-16 ENCOUNTER — Ambulatory Visit (HOSPITAL_COMMUNITY)
Admission: RE | Admit: 2021-07-16 | Discharge: 2021-07-16 | Disposition: A | Discharge status: Home / Self Care | Payer: Medicare Other | Attending: Vascular Surgery | Admitting: Vascular Surgery

## 2021-07-16 ENCOUNTER — Other Ambulatory Visit (HOSPITAL_COMMUNITY): Payer: Self-pay

## 2021-07-16 ENCOUNTER — Ambulatory Visit (HOSPITAL_COMMUNITY): Payer: Medicare Other

## 2021-07-16 ENCOUNTER — Other Ambulatory Visit: Payer: Self-pay

## 2021-07-16 ENCOUNTER — Encounter (HOSPITAL_COMMUNITY)
Admission: RE | Disposition: A | Discharge status: Home / Self Care | Payer: Self-pay | Source: Home / Self Care | Attending: Vascular Surgery

## 2021-07-16 ENCOUNTER — Ambulatory Visit (HOSPITAL_BASED_OUTPATIENT_CLINIC_OR_DEPARTMENT_OTHER): Payer: Medicare Other | Admitting: Physician Assistant

## 2021-07-16 DIAGNOSIS — N186 End stage renal disease: Secondary | ICD-10-CM

## 2021-07-16 DIAGNOSIS — Z7969 Long term (current) use of other immunomodulators and immunosuppressants: Secondary | ICD-10-CM | POA: Diagnosis not present

## 2021-07-16 DIAGNOSIS — I132 Hypertensive heart and chronic kidney disease with heart failure and with stage 5 chronic kidney disease, or end stage renal disease: Secondary | ICD-10-CM | POA: Insufficient documentation

## 2021-07-16 DIAGNOSIS — E1122 Type 2 diabetes mellitus with diabetic chronic kidney disease: Secondary | ICD-10-CM | POA: Diagnosis not present

## 2021-07-16 DIAGNOSIS — Z944 Liver transplant status: Secondary | ICD-10-CM | POA: Insufficient documentation

## 2021-07-16 DIAGNOSIS — M1990 Unspecified osteoarthritis, unspecified site: Secondary | ICD-10-CM | POA: Diagnosis not present

## 2021-07-16 DIAGNOSIS — I509 Heart failure, unspecified: Secondary | ICD-10-CM

## 2021-07-16 DIAGNOSIS — K7581 Nonalcoholic steatohepatitis (NASH): Secondary | ICD-10-CM | POA: Diagnosis not present

## 2021-07-16 DIAGNOSIS — Z992 Dependence on renal dialysis: Secondary | ICD-10-CM | POA: Insufficient documentation

## 2021-07-16 DIAGNOSIS — N185 Chronic kidney disease, stage 5: Secondary | ICD-10-CM | POA: Diagnosis not present

## 2021-07-16 DIAGNOSIS — I5042 Chronic combined systolic (congestive) and diastolic (congestive) heart failure: Secondary | ICD-10-CM | POA: Diagnosis not present

## 2021-07-16 DIAGNOSIS — Z794 Long term (current) use of insulin: Secondary | ICD-10-CM | POA: Insufficient documentation

## 2021-07-16 DIAGNOSIS — Z7982 Long term (current) use of aspirin: Secondary | ICD-10-CM | POA: Diagnosis not present

## 2021-07-16 DIAGNOSIS — R0602 Shortness of breath: Secondary | ICD-10-CM | POA: Diagnosis not present

## 2021-07-16 DIAGNOSIS — D631 Anemia in chronic kidney disease: Secondary | ICD-10-CM | POA: Diagnosis not present

## 2021-07-16 DIAGNOSIS — J811 Chronic pulmonary edema: Secondary | ICD-10-CM | POA: Diagnosis not present

## 2021-07-16 HISTORY — PX: AV FISTULA PLACEMENT: SHX1204

## 2021-07-16 LAB — POCT I-STAT, CHEM 8
BUN: 51 mg/dL — ABNORMAL HIGH (ref 8–23)
Calcium, Ion: 1.15 mmol/L (ref 1.15–1.40)
Chloride: 94 mmol/L — ABNORMAL LOW (ref 98–111)
Creatinine, Ser: 5 mg/dL — ABNORMAL HIGH (ref 0.61–1.24)
Glucose, Bld: 114 mg/dL — ABNORMAL HIGH (ref 70–99)
HCT: 36 % — ABNORMAL LOW (ref 39.0–52.0)
Hemoglobin: 12.2 g/dL — ABNORMAL LOW (ref 13.0–17.0)
Potassium: 5.3 mmol/L — ABNORMAL HIGH (ref 3.5–5.1)
Sodium: 130 mmol/L — ABNORMAL LOW (ref 135–145)
TCO2: 26 mmol/L (ref 22–32)

## 2021-07-16 LAB — GLUCOSE, CAPILLARY
Glucose-Capillary: 97 mg/dL (ref 70–99)
Glucose-Capillary: 132 mg/dL — ABNORMAL HIGH (ref 70–99)

## 2021-07-16 SURGERY — ARTERIOVENOUS (AV) FISTULA CREATION
Anesthesia: Regional | Site: Arm Upper | Laterality: Left

## 2021-07-16 MED ORDER — CHLORHEXIDINE GLUCONATE 0.12 % MT SOLN
15.0000 mL | Freq: Once | OROMUCOSAL | Status: AC
Start: 2021-07-16 — End: 2021-07-16
  Administered 2021-07-16: 15 mL via OROMUCOSAL
  Filled 2021-07-16: qty 15

## 2021-07-16 MED ORDER — ACETAMINOPHEN 500 MG PO TABS
1000.0000 mg | ORAL_TABLET | Freq: Once | ORAL | Status: AC
  Administered 2021-07-16: 1000 mg via ORAL
  Filled 2021-07-16: qty 2

## 2021-07-16 MED ORDER — CHLORHEXIDINE GLUCONATE 4 % EX LIQD: 60.0000 mL | Freq: Once | CUTANEOUS | Status: DC

## 2021-07-16 MED ORDER — FENTANYL CITRATE (PF) 100 MCG/2ML IJ SOLN
INTRAMUSCULAR | Status: AC
  Administered 2021-07-16: 50 ug via INTRAVENOUS
  Filled 2021-07-16: qty 2

## 2021-07-16 MED ORDER — ONDANSETRON HCL 4 MG/2ML IJ SOLN
INTRAMUSCULAR | Status: AC
  Administered 2021-07-16: 4 mg
  Filled 2021-07-16: qty 2

## 2021-07-16 MED ORDER — FENTANYL CITRATE (PF) 100 MCG/2ML IJ SOLN: 25.0000 ug | INTRAMUSCULAR | Status: DC | PRN

## 2021-07-16 MED ORDER — PAPAVERINE HCL 30 MG/ML IJ SOLN
INTRAMUSCULAR | Status: AC
  Filled 2021-07-16: qty 2

## 2021-07-16 MED ORDER — CEFAZOLIN SODIUM-DEXTROSE 2-4 GM/100ML-% IV SOLN
2.0000 g | INTRAVENOUS | Status: AC
  Administered 2021-07-16: 2 g via INTRAVENOUS
  Filled 2021-07-16: qty 100

## 2021-07-16 MED ORDER — PROPOFOL 500 MG/50ML IV EMUL
INTRAVENOUS | Status: DC | PRN
  Administered 2021-07-16: 50 ug/kg/min via INTRAVENOUS

## 2021-07-16 MED ORDER — LIDOCAINE HCL (PF) 1 % IJ SOLN
INTRAMUSCULAR | Status: AC
  Filled 2021-07-16: qty 30

## 2021-07-16 MED ORDER — FENTANYL CITRATE (PF) 250 MCG/5ML IJ SOLN
INTRAMUSCULAR | Status: AC
  Filled 2021-07-16: qty 5

## 2021-07-16 MED ORDER — OXYCODONE HCL 5 MG/5ML PO SOLN: 5.0000 mg | Freq: Once | ORAL | Status: DC | PRN

## 2021-07-16 MED ORDER — PHENYLEPHRINE HCL-NACL 20-0.9 MG/250ML-% IV SOLN
INTRAVENOUS | Status: DC | PRN
  Administered 2021-07-16: 50 ug/min via INTRAVENOUS

## 2021-07-16 MED ORDER — ONDANSETRON HCL 4 MG/2ML IJ SOLN: 4.0000 mg | Freq: Once | INTRAMUSCULAR | Status: AC

## 2021-07-16 MED ORDER — FENTANYL CITRATE (PF) 250 MCG/5ML IJ SOLN
INTRAMUSCULAR | Status: DC | PRN
  Administered 2021-07-16: 50 ug via INTRAVENOUS

## 2021-07-16 MED ORDER — HEPARIN SODIUM (PORCINE) 1000 UNIT/ML IJ SOLN
INTRAMUSCULAR | Status: DC | PRN
  Administered 2021-07-16: 3000 [IU] via INTRAVENOUS

## 2021-07-16 MED ORDER — FENTANYL CITRATE (PF) 100 MCG/2ML IJ SOLN: 50.0000 ug | Freq: Once | INTRAMUSCULAR | Status: AC

## 2021-07-16 MED ORDER — LIDOCAINE-EPINEPHRINE (PF) 1.5 %-1:200000 IJ SOLN
INTRAMUSCULAR | Status: DC | PRN
  Administered 2021-07-16: 12.5 mL via PERINEURAL

## 2021-07-16 MED ORDER — OXYCODONE-ACETAMINOPHEN 5-325 MG PO TABS
1.0000 | ORAL_TABLET | ORAL | 0 refills | Status: DC | PRN
  Filled 2021-07-16: qty 12, 2d supply, fill #0

## 2021-07-16 MED ORDER — SODIUM CHLORIDE 0.9 % IV SOLN
INTRAVENOUS | Status: DC
Start: 2021-07-16 — End: 2021-07-16

## 2021-07-16 MED ORDER — LIDOCAINE HCL (PF) 1.5 % IJ SOLN
INTRAMUSCULAR | Status: DC | PRN
  Administered 2021-07-16: 12.5 mL via PERINEURAL

## 2021-07-16 MED ORDER — OXYCODONE HCL 5 MG PO TABS: 5.0000 mg | ORAL_TABLET | Freq: Once | ORAL | Status: DC | PRN

## 2021-07-16 MED ORDER — STERILE WATER FOR IRRIGATION IR SOLN
Status: DC | PRN
  Administered 2021-07-16: 1000 mL

## 2021-07-16 MED ORDER — HEPARIN 6000 UNIT IRRIGATION SOLUTION
Status: DC | PRN
  Administered 2021-07-16: 1

## 2021-07-16 MED ORDER — HEPARIN 6000 UNIT IRRIGATION SOLUTION
Status: AC
  Filled 2021-07-16: qty 500

## 2021-07-16 MED ORDER — EPHEDRINE 5 MG/ML INJ
INTRAVENOUS | Status: AC
  Filled 2021-07-16: qty 5

## 2021-07-16 MED ORDER — SODIUM CHLORIDE 0.9 % IV SOLN: INTRAVENOUS | Status: DC

## 2021-07-16 MED ORDER — 0.9 % SODIUM CHLORIDE (POUR BTL) OPTIME
TOPICAL | Status: DC | PRN
  Administered 2021-07-16: 1000 mL

## 2021-07-16 MED ORDER — MIDAZOLAM HCL 2 MG/2ML IJ SOLN
INTRAMUSCULAR | Status: AC
  Filled 2021-07-16: qty 2

## 2021-07-16 MED ORDER — EPHEDRINE SULFATE-NACL 50-0.9 MG/10ML-% IV SOSY
PREFILLED_SYRINGE | INTRAVENOUS | Status: DC | PRN
  Administered 2021-07-16: 10 mg via INTRAVENOUS

## 2021-07-16 MED ORDER — ORAL CARE MOUTH RINSE: 15.0000 mL | Freq: Once | OROMUCOSAL | Status: AC

## 2021-07-16 MED ORDER — PHENYLEPHRINE 80 MCG/ML (10ML) SYRINGE FOR IV PUSH (FOR BLOOD PRESSURE SUPPORT)
PREFILLED_SYRINGE | INTRAVENOUS | Status: DC | PRN
  Administered 2021-07-16 (×2): 80 ug via INTRAVENOUS
  Administered 2021-07-16 (×2): 160 ug via INTRAVENOUS

## 2021-07-16 SURGICAL SUPPLY — 35 items
ADH SKN CLS APL DERMABOND .7 (GAUZE/BANDAGES/DRESSINGS) ×1
AGENT HMST SPONGE THK3/8 (HEMOSTASIS)
ARMBAND PINK RESTRICT EXTREMIT (MISCELLANEOUS) ×4 IMPLANT
BAG COUNTER SPONGE SURGICOUNT (BAG) ×2 IMPLANT
BAG SPNG CNTER NS LX DISP (BAG) ×1
BLADE CLIPPER SURG (BLADE) ×2 IMPLANT
CANISTER SUCT 3000ML PPV (MISCELLANEOUS) ×2 IMPLANT
CLIP VESOCCLUDE MED 6/CT (CLIP) ×2 IMPLANT
CLIP VESOCCLUDE SM WIDE 6/CT (CLIP) ×2 IMPLANT
COVER PROBE W GEL 5X96 (DRAPES) ×3 IMPLANT
DECANTER SPIKE VIAL GLASS SM (MISCELLANEOUS) ×2 IMPLANT
DERMABOND ADVANCED (GAUZE/BANDAGES/DRESSINGS) ×1
DERMABOND ADVANCED .7 DNX12 (GAUZE/BANDAGES/DRESSINGS) ×1 IMPLANT
ELECT REM PT RETURN 9FT ADLT (ELECTROSURGICAL) ×2
ELECTRODE REM PT RTRN 9FT ADLT (ELECTROSURGICAL) ×1 IMPLANT
GLOVE BIOGEL PI IND STRL 8 (GLOVE) ×1 IMPLANT
GLOVE BIOGEL PI INDICATOR 8 (GLOVE) ×1
GOWN STRL REUS W/ TWL LRG LVL3 (GOWN DISPOSABLE) ×2 IMPLANT
GOWN STRL REUS W/ TWL XL LVL3 (GOWN DISPOSABLE) ×2 IMPLANT
GOWN STRL REUS W/TWL LRG LVL3 (GOWN DISPOSABLE) ×4
GOWN STRL REUS W/TWL XL LVL3 (GOWN DISPOSABLE) ×2
HEMOSTAT SPONGE AVITENE ULTRA (HEMOSTASIS) IMPLANT
KIT BASIN OR (CUSTOM PROCEDURE TRAY) ×2 IMPLANT
KIT TURNOVER KIT B (KITS) ×2 IMPLANT
NS IRRIG 1000ML POUR BTL (IV SOLUTION) ×2 IMPLANT
PACK CV ACCESS (CUSTOM PROCEDURE TRAY) ×2 IMPLANT
PAD ARMBOARD 7.5X6 YLW CONV (MISCELLANEOUS) ×4 IMPLANT
SLING ARM FOAM STRAP MED (SOFTGOODS) ×1 IMPLANT
SUT MNCRL AB 4-0 PS2 18 (SUTURE) ×2 IMPLANT
SUT PROLENE 6 0 BV (SUTURE) ×2 IMPLANT
SUT VIC AB 3-0 SH 27 (SUTURE) ×2
SUT VIC AB 3-0 SH 27X BRD (SUTURE) ×1 IMPLANT
TOWEL GREEN STERILE (TOWEL DISPOSABLE) ×2 IMPLANT
UNDERPAD 30X36 HEAVY ABSORB (UNDERPADS AND DIAPERS) ×2 IMPLANT
WATER STERILE IRR 1000ML POUR (IV SOLUTION) ×2 IMPLANT

## 2021-07-16 NOTE — H&P (Signed)
History and Physical Interval Note:  07/16/2021 9:16 AM  Gregory Tanner  has presented today for surgery, with the diagnosis of ESRD.  The various methods of treatment have been discussed with the patient and family. After consideration of risks, benefits and other options for treatment, the patient has consented to  Procedure(s): LEFT ARM ARTERIOVENOUS (AV) FISTULA CREATION (Left) as a surgical intervention.  The patient's history has been reviewed, patient examined, no change in status, stable for surgery.  I have reviewed the patient's chart and labs.  Questions were answered to the patient's satisfaction.     [name]  Patient name: Gregory Tanner        MRN: [id number]        DOB: 14-Dec-1955        Sex: male   REASON FOR CONSULT: Evaluate for permanent hemodialysis access   HPI: Gregory Tanner is a 66 y.o. male, with history of hypertension, end-stage renal disease on hemodialysis Monday/Wednesday/Friday, chronic systolic and diastolic heart failure, [name] cirrhosis status post liver transplant that presents for evaluation of permanent hemodialysis access.  Patient currently uses a right IJ tunneled dialysis catheter.  He has been on dialysis for several months.  No previous access.  No chest wall implants other than the right IJ catheter.  He is right-handed.  He was recently discharged with volume overload in the setting of cardiorenal syndrome 06/03/2021       Past Medical History:  Diagnosis Date   Ankylosis of lumbar spine 02/21/2014   Benign essential hypertension 10/26/2015   Chronic diastolic heart failure ([place]) 09/26/2016   Chronic pain associated with significant psychosocial dysfunction 06/17/2015   Degeneration of intervertebral disc of lumbar region 07/05/2013   ESRD (end stage renal disease) on dialysis ([place]) 03/27/2017   Hearing loss     Hepatocellular carcinoma ([place]) 12/27/2010    Overview:  Embolized 6/12    History of liver transplant ([place]) 07/20/2011     Overview:  06/07/2011 for cryptogenic cirrhosis and HCC    Uncontrolled type 2 diabetes mellitus with insulin therapy 06/21/2018           Past Surgical History:  Procedure Laterality Date   BACK SURGERY       BAKER CYST SURGERY        RIGHT LEG   BUBBLE STUDY   12/14/2020    Procedure: BUBBLE STUDY;  Surgeon: [name], MD;  Location: [facility];  Service: Cardiovascular;;   EYE SURGERY Left     HERNIA REPAIR       IR PARACENTESIS   11/30/2020   IR PARACENTESIS   05/20/2021   IR PARACENTESIS   06/01/2021   IR PARACENTESIS   06/17/2021   IR PARACENTESIS   07/13/2021   LIVER SURGERY        IMPLANT   LIVER TRANSPLANT       RIGHT HEART CATH N/A 12/11/2020    Procedure: RIGHT HEART CATH;  Surgeon: [name], MD;  Location: [facility];  Service: Cardiovascular;  Laterality: N/A;   TEE WITHOUT CARDIOVERSION N/A 12/14/2020    Procedure: TRANSESOPHAGEAL ECHOCARDIOGRAM (TEE);  Surgeon: [name], MD;  Location[facility] ENDOSCOPY;  Service: Cardiovascular;  Laterality: N/A;   TONSILL SURGERY       TONSILLECTOMY               Family History  Problem Relation Age of Onset   Cancer Mother     Diabetes Father  Heart attack Father     Schizophrenia Father     Hypertension Brother        SOCIAL HISTORY: Social History         Socioeconomic History   Marital status: Married      Spouse name: Not on file   Number of children: Not on file   Years of education: Not on file   Highest education level: Not on file  Occupational History   Not on file  Tobacco Use   Smoking status: Never   Smokeless tobacco: Never  Vaping Use   Vaping Use: Never used  Substance and Sexual Activity   Alcohol use: Yes      Alcohol/week: 0.0 standard drinks      Comment: ocassional   Drug use: Not Currently   Sexual activity: Not on file  Other Topics Concern   Not on file  Social History Narrative   Not on file    Social Determinants of Health    Financial Resource  Strain: Not on file  Food Insecurity: Not on file  Transportation Needs: Not on file  Physical Activity: Not on file  Stress: Not on file  Social Connections: Not on file  Intimate Partner Violence: Not on file           Allergies  Allergen Reactions   Iodinated Contrast Media Hives and Other (See Comments)      Allergy is not to all contrast - but patient is unsure of which particular one his allergy is to. Allergy is not to all contrast - but patient is unsure of which particular one his allergy is to. Contrast dye used before liver transplant cause hives, not sure which dye this was but is able to use others   Jardiance [Empagliflozin] Dermatitis      developed blisters with the medication, blisters  stopped after he discontinued taking it. (About 6 months ago /~06/2020)   Latex Other (See Comments)      Skin peeling   Metformin And Related Nausea And Vomiting   Silicone Other (See Comments)      Skin peeling   Rosiglitazone Nausea And Vomiting      GI effects and abdominal pain            Current Outpatient Medications  Medication Sig Dispense Refill   allopurinol (ZYLOPRIM) 300 MG tablet Take 150 mg by mouth daily.       aspirin 81 MG EC tablet Take 81 mg by mouth daily.       bumetanide (BUMEX) 2 MG tablet Take 4 mg by mouth See admin instructions. Take 4 mg twice daily on non dialysis days. Tuesday, Thursday, and Sunday.       dextromethorphan-guaiFENesin (MUCINEX DM) 30-600 MG 12hr tablet Take 1 tablet by mouth 2 (two) times daily as needed for cough. (Patient taking differently: Take 1 tablet by mouth 4 (four) times daily as needed for cough.) 60 tablet 0   doxycycline (VIBRA-TABS) 100 MG tablet Take 1 tablet (100 mg total) by mouth 2 (two) times daily. 20 tablet 0   insulin lispro (HUMALOG) 100 UNIT/ML KwikPen Inject 0-40 Units into the skin See admin instructions. 3 - 4 times daily       midodrine (PROAMATINE) 10 MG tablet Take 10 mg by mouth See admin instructions. Take  '10mg'$  before dialysis on Monday, Wednesday, Friday, and Saturday.       Multiple Vitamin (MULTIVITAMIN) tablet Take 1 tablet by mouth daily.  mycophenolate (CELLCEPT) 250 MG capsule Take 4 capsules (1,000 mg total) by mouth in the morning and at bedtime. 240 capsule 0   TART CHERRY PO Take 1 tablet by mouth in the morning and at bedtime.       triamcinolone ointment (KENALOG) 0.1 % Apply 1 application. topically 2 (two) times daily.        No current facility-administered medications for this visit.             Facility-Administered Medications Ordered in Other Visits  Medication Dose Route Frequency Provider Last Rate Last Admin   lidocaine (PF) (XYLOCAINE) 1 % injection     PRN Tyson Alias, NP   5 mL at 07/13/21 0830   lidocaine (XYLOCAINE) 1 % (with pres) injection                  REVIEW OF SYSTEMS:  '[X]'$  denotes positive finding, '[ ]'$  denotes negative finding Cardiac   Comments:  Chest pain or chest pressure:      Shortness of breath upon exertion:      Short of breath when lying flat:      Irregular heart rhythm:             Vascular      Pain in calf, thigh, or hip brought on by ambulation:      Pain in feet at night that wakes you up from your sleep:       Blood clot in your veins:      Leg swelling:              Pulmonary      Oxygen at home:      Productive cough:       Wheezing:              Neurologic      Sudden weakness in arms or legs:       Sudden numbness in arms or legs:       Sudden onset of difficulty speaking or slurred speech:      Temporary loss of vision in one eye:       Problems with dizziness:              Gastrointestinal      Blood in stool:       Vomited blood:              Genitourinary      Burning when urinating:       Blood in urine:             Psychiatric      Major depression:              Hematologic      Bleeding problems:      Problems with blood clotting too easily:             Skin      Rashes or ulcers:              Constitutional      Fever or chills:          PHYSICAL EXAM:    Vitals:    07/13/21 1120  BP: (!) 126/57  Pulse: 88  Resp: 18  Temp: 97.8 F (36.6 C)  TempSrc: Temporal  SpO2: 96%  Weight: 194 lb (88 kg)  Height: '5\' 10"'$  (1.778 m)      GENERAL: The patient is a well-nourished male, in no acute distress. The vital  signs are documented above. CARDIAC: There is a regular rate and rhythm.  VASCULAR:  Palpable brachial and radial pulses bilateral upper extremities Right IJ tunnel catheter No upper extremity tissue loss PULMONARY: No respiratory distress. ABDOMEN: Soft and non-tender. MUSCULOSKELETAL: There are no major deformities or cyanosis. NEUROLOGIC: No focal weakness or paresthesias are detected. SKIN: There are no ulcers or rashes noted. PSYCHIATRIC: The patient has a normal affect.   DATA:    Upper extremity arterial duplex showed triphasic waveforms in both upper extremities.   Upper extremity vein mapping shows usable cephalic and basilic veins in both upper extremities   Assessment/Plan:   66 yo M with history of hypertension, end-stage renal disease on hemodialysis Monday/Wednesday/Friday, chronic systolic and diastolic heart failure, [place] cirrhosis status post liver transplant that presents for evaluation of permanent hemodialysis access.  Discussed plan for access in the left arm given that is his nondominant arm.  He has a usable cephalic and basilic vein.  Discussed after placement typically takes 3 months to mature.  Risk and benefits discussed including risk of bleeding, infection, failure to mature, steal syndrome.  We will get him scheduled for Friday at [facility].     [name], MD [facility] [place] Office: [phone]

## 2021-07-16 NOTE — Progress Notes (Signed)
Orthopedic Tech Progress Note Patient Details:  Gregory Tanner [birth year] [id number]  Ortho Devices Type of Ortho Device: Sling immobilizer Ortho Device/Splint Interventions: Ordered     Sling dropped off with PACU RN.  Gregory Tanner 07/16/2021, 11:30 AM

## 2021-07-16 NOTE — Op Note (Signed)
OPERATIVE NOTE   PROCEDURE: left radiocephaic arteriovenous fistula placement  PRE-OPERATIVE DIAGNOSIS: End stage renal disease  POST-OPERATIVE DIAGNOSIS: same  SURGEON: [name], MD  ASSISTANT(S): Vicente Serene, PA  ANESTHESIA: regional  ESTIMATED BLOOD LOSS: <25 mL  FINDING(S): 1.  Cephalic vein: 3.5 mm, acceptable 2.  Radial artery: 3 mm, atherosclerotic disease evident 3.  Venous outflow: palpable thrill  4.  Radial flow: palpable radial pulse  SPECIMEN(S):  none  INDICATIONS:   Gregory Tanner is a 66 y.o. male who presents with end stage renal disease and the need for permanent hemodialysis access.  The patient is scheduled for left arm arteriovenous fistula placement.  The patient is aware the risks include but are not limited to: bleeding, infection, steal syndrome, nerve damage, ischemic monomelic neuropathy, failure to mature, and need for additional procedures.  The patient is aware of the risks of the procedure and elects to proceed forward.  An assistant was needed for retraction and to sew the anastomosis.    DESCRIPTION: After full informed written consent was obtained from the patient, the patient was brought back to the operating room and placed supine upon the operating table.  Prior to induction, the patient received IV antibiotics.   After obtaining adequate anesthesia, the patient was then prepped and draped in the standard fashion for a left arm access procedure.   I turned my attention first to identifying the patient's distal cephalic vein and radial artery.  Using SonoSite guidance, the location of these vessels were evaluated and both appeared to be of good caliber and looked better than the vein mapping suggested.   I made a longitudinal incision at the level of the wrist and dissected through the subcutaneous tissue and fascia to gain exposure of the radial artery.  This was noted to be 3 mm in diameter externally.  This was dissected out  proximally and distally and controlled with vessel loops .  I then dissected out the cephalic vein.  This was noted to be 3.5 mm in diameter externally.  The distal segment of the vein was ligated with a  2-0 silk, and the vein was transected.  The proximal segment was interrogated with serial dilators.  The vein accepted up to a 4 mm dilator without any difficulty.  I then instilled the heparinized saline into the vein and clamped it.  At this point, I reset my exposure of the radial artery.  The patient was given 3,000 units IV heparin.  I then placed the artery under tension proximally and distally.  I made an arteriotomy with a #11 blade, and then I extended the arteriotomy with a Potts scissor.  I injected heparinized saline proximal and distal to this arteriotomy.  The vein was then sewn to the artery in an end-to-side configuration with a running stitch of 6-0 Prolene with the help of my assistant.  Prior to completing this anastomosis, I allowed the vein and artery to backbleed.  There was no evidence of clot from any vessels.  I completed the anastomosis in the usual fashion and then released all vessel loops and clamps.    There was a palpable thrill in the venous outflow, and there was a palpable radial pulse.  At this point, I irrigated out the surgical wound.  There was no further active bleeding.  The subcutaneous tissue was reapproximated with a running stitch of 3-0 Vicryl.  The skin was then reapproximated with a running subcuticular stitch of 4-0 Vicryl.  The skin was then cleaned, dried, and reinforced with Dermabond.  The patient tolerated this procedure well.   COMPLICATIONS: None  CONDITION: Stable  Monica Martinez, MD [facility] Office: [place]   07/16/2021, 11:02 AM

## 2021-07-16 NOTE — Transfer of Care (Signed)
Immediate Anesthesia Transfer of Care Note  Patient: Gregory Tanner  Procedure(s) Performed: LEFT ARM RADIOCEPHALIC ARTERIOVENOUS  FISTULA CREATION (Left: Arm Upper)  Patient Location: PACU  Anesthesia Type:MAC combined with regional for post-op pain  Level of Consciousness: drowsy and patient cooperative  Airway & Oxygen Therapy: Patient Spontanous Breathing  Post-op Assessment: Report given to RN and Post -op Vital signs reviewed and stable  Post vital signs: Reviewed and stable  Last Vitals:  Vitals Value Taken Time  BP 116/68 07/16/21 1116  Temp    Pulse 87 07/16/21 1115  Resp 19 07/16/21 1115  SpO2 94 % 07/16/21 1115  Vitals shown include unvalidated device data.  Last Pain:  Vitals:   07/16/21 0743  TempSrc:   PainSc: 0-No pain         Complications: No notable events documented.

## 2021-07-16 NOTE — Discharge Instructions (Signed)
[facility]  Discharge Instructions  AV Fistula or Graft Surgery for Dialysis Access  Please refer to the following instructions for your post-procedure care. Your surgeon or physician assistant will discuss any changes with you.  Activity  You may drive the day following your surgery, if you are comfortable and no longer taking prescription pain medication. Resume full activity as the soreness in your incision resolves.  Bathing/Showering  You may shower after you go home. Keep your incision dry for 48 hours. Do not soak in a bathtub, hot tub, or swim until the incision heals completely. You may not shower if you have a hemodialysis catheter.  Incision Care  Clean your incision with mild soap and water after 48 hours. Pat the area dry with a clean towel. You do not need a bandage unless otherwise instructed. Do not apply any ointments or creams to your incision. You may have skin glue on your incision. Do not peel it off. It will come off on its own in about one week. Your arm may swell a bit after surgery. To reduce swelling use pillows to elevate your arm so it is above your heart. Your doctor will tell you if you need to lightly wrap your arm with an ACE bandage.  Diet  Resume your normal diet. There are not special food restrictions following this procedure. In order to heal from your surgery, it is CRITICAL to get adequate nutrition. Your body requires vitamins, minerals, and protein. Vegetables are the best source of vitamins and minerals. Vegetables also provide the perfect balance of protein. Processed food has little nutritional value, so try to avoid this.  Medications  Resume taking all of your medications. If your incision is causing pain, you may take over-the counter pain relievers such as acetaminophen (Tylenol). If you were prescribed a stronger pain medication, please be aware these medications can cause nausea and constipation. Prevent  nausea by taking the medication with a snack or meal. Avoid constipation by drinking plenty of fluids and eating foods with high amount of fiber, such as fruits, vegetables, and grains.  Do not take Tylenol if you are taking prescription pain medications.  Follow up Your surgeon may want to see you in the office following your access surgery. If so, this will be arranged at the time of your surgery.  Please call us immediately for any of the following conditions:  Increased pain, redness, drainage (pus) from your incision site Fever of 101 degrees or higher Severe or worsening pain at your incision site Hand pain or numbness.  Reduce your risk of vascular disease:  Stop smoking. If you would like help, call [facility] at 1-800-QUIT-NOW 4635377418) or [place] at [place] your cholesterol Maintain a desired weight Control your diabetes Keep your blood pressure down  Dialysis  It will take several weeks to several months for your new dialysis access to be ready for use. Your surgeon will determine when it is okay to use it. Your nephrologist will continue to direct your dialysis. You can continue to use your Permcath until your new access is ready for use.   07/16/2021 SHAKEEL DISNEY [id number] 1955/12/15  Surgeon(s): [name], MD  Procedure(s): LEFT ARM RADIOCEPHALIC ARTERIOVENOUS  FISTULA CREATION   May stick graft immediately   May stick graft on designated area only:   x Do not stick fistula for 12 weeks    If you have any questions, please call the office at  [phone].  

## 2021-07-16 NOTE — Anesthesia Postprocedure Evaluation (Signed)
Anesthesia Post Note  Patient: Gregory Tanner  Procedure(s) Performed: LEFT ARM RADIOCEPHALIC ARTERIOVENOUS  FISTULA CREATION (Left: Arm Upper)     Patient location during evaluation: PACU Anesthesia Type: Regional Level of consciousness: awake and alert Pain management: pain level controlled Vital Signs Assessment: post-procedure vital signs reviewed and stable Respiratory status: spontaneous breathing Cardiovascular status: stable Anesthetic complications: no   No notable events documented.  Last Vitals:  Vitals:   07/16/21 1245 07/16/21 1300  BP: 107/62 105/63  Pulse: 88 87  Resp: (!) 26 (!) 24  Temp:  36.7 C  SpO2: 95% 95%    Last Pain:  Vitals:   07/16/21 1115  TempSrc:   PainSc: [place]

## 2021-07-16 NOTE — Anesthesia Procedure Notes (Signed)
Anesthesia Regional Block: Supraclavicular block   Pre-Anesthetic Checklist: , timeout performed,  Correct Patient, Correct Site, Correct Laterality,  Correct Procedure, Correct Position, site marked,  Risks and benefits discussed,  Surgical consent,  Pre-op evaluation,  At surgeon's request and post-op pain management  Laterality: Upper and Left  Prep: chloraprep       Needles:  Injection technique: Single-shot  Needle Type: Stimiplex          Additional Needles:   Procedures:,,,, ultrasound used (permanent image in chart),,    Narrative:  Start time: 07/16/2021 8:50 AM End time: 07/16/2021 9:10 AM Injection made incrementally with aspirations every 5 mL.  Performed by: Personally  Anesthesiologist: Gregory Nations, MD  Additional Notes: BP cuff, SpO2 and EKG monitors applied. Sedation begun. Nerve location verified with ultrasound. Anesthetic injected incrementally, slowly, and after neg aspirations under direct u/s guidance. Good perineural spread. Tolerated well.

## 2021-07-17 ENCOUNTER — Encounter (HOSPITAL_COMMUNITY): Payer: Self-pay | Admitting: Vascular Surgery

## 2021-07-17 DIAGNOSIS — D509 Iron deficiency anemia, unspecified: Secondary | ICD-10-CM | POA: Diagnosis not present

## 2021-07-17 DIAGNOSIS — Z992 Dependence on renal dialysis: Secondary | ICD-10-CM | POA: Diagnosis not present

## 2021-07-17 DIAGNOSIS — N186 End stage renal disease: Secondary | ICD-10-CM | POA: Diagnosis not present

## 2021-07-17 DIAGNOSIS — N2581 Secondary hyperparathyroidism of renal origin: Secondary | ICD-10-CM | POA: Diagnosis not present

## 2021-07-19 ENCOUNTER — Other Ambulatory Visit (HOSPITAL_COMMUNITY): Payer: Self-pay | Admitting: Cardiology

## 2021-07-19 ENCOUNTER — Other Ambulatory Visit: Payer: Self-pay | Admitting: Cardiology

## 2021-07-19 DIAGNOSIS — R188 Other ascites: Secondary | ICD-10-CM

## 2021-07-19 DIAGNOSIS — N2581 Secondary hyperparathyroidism of renal origin: Secondary | ICD-10-CM | POA: Diagnosis not present

## 2021-07-19 DIAGNOSIS — N186 End stage renal disease: Secondary | ICD-10-CM | POA: Diagnosis not present

## 2021-07-19 DIAGNOSIS — D509 Iron deficiency anemia, unspecified: Secondary | ICD-10-CM | POA: Diagnosis not present

## 2021-07-19 DIAGNOSIS — Z992 Dependence on renal dialysis: Secondary | ICD-10-CM | POA: Diagnosis not present

## 2021-07-20 ENCOUNTER — Encounter: Payer: Self-pay | Admitting: Podiatrist

## 2021-07-20 ENCOUNTER — Ambulatory Visit (HOSPITAL_COMMUNITY)
Admission: RE | Admit: 2021-07-20 | Discharge: 2021-07-20 | Disposition: A | Discharge status: Home / Self Care | Payer: Medicare Other | Source: Ambulatory Visit | Attending: Cardiology | Admitting: Cardiology

## 2021-07-20 ENCOUNTER — Ambulatory Visit: Payer: Medicare Other | Admitting: Podiatrist

## 2021-07-20 DIAGNOSIS — Z944 Liver transplant status: Secondary | ICD-10-CM | POA: Diagnosis not present

## 2021-07-20 DIAGNOSIS — K7581 Nonalcoholic steatohepatitis (NASH): Secondary | ICD-10-CM | POA: Diagnosis not present

## 2021-07-20 DIAGNOSIS — Z9889 Other specified postprocedural states: Secondary | ICD-10-CM

## 2021-07-20 DIAGNOSIS — R188 Other ascites: Secondary | ICD-10-CM | POA: Insufficient documentation

## 2021-07-20 DIAGNOSIS — Z8505 Personal history of malignant neoplasm of liver: Secondary | ICD-10-CM | POA: Diagnosis not present

## 2021-07-20 HISTORY — PX: IR PARACENTESIS: IMG2679

## 2021-07-20 MED ORDER — LIDOCAINE HCL 1 % IJ SOLN
INTRAMUSCULAR | Status: AC
  Filled 2021-07-20: qty 20

## 2021-07-20 MED ORDER — LIDOCAINE HCL (PF) 1 % IJ SOLN
INTRAMUSCULAR | Status: DC | PRN
  Administered 2021-07-20: 10 mL

## 2021-07-20 NOTE — Procedures (Signed)
PROCEDURE SUMMARY:  Successful US guided paracentesis from left lower quadrant.  Yielded 3 L of clear yellow fluid.  No immediate complications.  Pt tolerated well.   Specimen not sent for labs.  EBL < 2 mL  Theresa Duty, NP 07/20/2021 3:08 PM

## 2021-07-20 NOTE — Patient Instructions (Signed)
Put some peroxide on the toe and allow to sit for a couple minutes- this will help dissolve the scab and allow it to heal

## 2021-07-21 DIAGNOSIS — Z992 Dependence on renal dialysis: Secondary | ICD-10-CM | POA: Diagnosis not present

## 2021-07-21 DIAGNOSIS — N2581 Secondary hyperparathyroidism of renal origin: Secondary | ICD-10-CM | POA: Diagnosis not present

## 2021-07-21 DIAGNOSIS — D509 Iron deficiency anemia, unspecified: Secondary | ICD-10-CM | POA: Diagnosis not present

## 2021-07-21 DIAGNOSIS — N186 End stage renal disease: Secondary | ICD-10-CM | POA: Diagnosis not present

## 2021-07-23 DIAGNOSIS — D509 Iron deficiency anemia, unspecified: Secondary | ICD-10-CM | POA: Diagnosis not present

## 2021-07-23 DIAGNOSIS — Z9181 History of falling: Secondary | ICD-10-CM | POA: Diagnosis not present

## 2021-07-23 DIAGNOSIS — N2581 Secondary hyperparathyroidism of renal origin: Secondary | ICD-10-CM | POA: Diagnosis not present

## 2021-07-23 DIAGNOSIS — I50810 Right heart failure, unspecified: Secondary | ICD-10-CM | POA: Diagnosis not present

## 2021-07-23 DIAGNOSIS — I071 Rheumatic tricuspid insufficiency: Secondary | ICD-10-CM | POA: Diagnosis not present

## 2021-07-23 DIAGNOSIS — M5416 Radiculopathy, lumbar region: Secondary | ICD-10-CM | POA: Diagnosis not present

## 2021-07-23 DIAGNOSIS — N186 End stage renal disease: Secondary | ICD-10-CM | POA: Diagnosis not present

## 2021-07-23 DIAGNOSIS — E1122 Type 2 diabetes mellitus with diabetic chronic kidney disease: Secondary | ICD-10-CM | POA: Diagnosis not present

## 2021-07-23 DIAGNOSIS — I132 Hypertensive heart and chronic kidney disease with heart failure and with stage 5 chronic kidney disease, or end stage renal disease: Secondary | ICD-10-CM | POA: Diagnosis not present

## 2021-07-23 DIAGNOSIS — Z992 Dependence on renal dialysis: Secondary | ICD-10-CM | POA: Diagnosis not present

## 2021-07-23 DIAGNOSIS — E8770 Fluid overload, unspecified: Secondary | ICD-10-CM | POA: Diagnosis not present

## 2021-07-23 DIAGNOSIS — I272 Pulmonary hypertension, unspecified: Secondary | ICD-10-CM | POA: Diagnosis not present

## 2021-07-23 DIAGNOSIS — Z944 Liver transplant status: Secondary | ICD-10-CM | POA: Diagnosis not present

## 2021-07-23 DIAGNOSIS — M5136 Other intervertebral disc degeneration, lumbar region: Secondary | ICD-10-CM | POA: Diagnosis not present

## 2021-07-23 DIAGNOSIS — I5033 Acute on chronic diastolic (congestive) heart failure: Secondary | ICD-10-CM | POA: Diagnosis not present

## 2021-07-23 DIAGNOSIS — I890 Lymphedema, not elsewhere classified: Secondary | ICD-10-CM | POA: Diagnosis not present

## 2021-07-23 DIAGNOSIS — Z794 Long term (current) use of insulin: Secondary | ICD-10-CM | POA: Diagnosis not present

## 2021-07-26 DIAGNOSIS — N2581 Secondary hyperparathyroidism of renal origin: Secondary | ICD-10-CM | POA: Diagnosis not present

## 2021-07-26 DIAGNOSIS — E1122 Type 2 diabetes mellitus with diabetic chronic kidney disease: Secondary | ICD-10-CM | POA: Diagnosis not present

## 2021-07-26 DIAGNOSIS — I132 Hypertensive heart and chronic kidney disease with heart failure and with stage 5 chronic kidney disease, or end stage renal disease: Secondary | ICD-10-CM | POA: Diagnosis not present

## 2021-07-26 DIAGNOSIS — I071 Rheumatic tricuspid insufficiency: Secondary | ICD-10-CM | POA: Diagnosis not present

## 2021-07-26 DIAGNOSIS — E8770 Fluid overload, unspecified: Secondary | ICD-10-CM | POA: Diagnosis not present

## 2021-07-26 DIAGNOSIS — Z944 Liver transplant status: Secondary | ICD-10-CM | POA: Diagnosis not present

## 2021-07-26 DIAGNOSIS — N186 End stage renal disease: Secondary | ICD-10-CM | POA: Diagnosis not present

## 2021-07-26 DIAGNOSIS — I890 Lymphedema, not elsewhere classified: Secondary | ICD-10-CM | POA: Diagnosis not present

## 2021-07-26 DIAGNOSIS — Z794 Long term (current) use of insulin: Secondary | ICD-10-CM | POA: Diagnosis not present

## 2021-07-26 DIAGNOSIS — I50810 Right heart failure, unspecified: Secondary | ICD-10-CM | POA: Diagnosis not present

## 2021-07-26 DIAGNOSIS — I5033 Acute on chronic diastolic (congestive) heart failure: Secondary | ICD-10-CM | POA: Diagnosis not present

## 2021-07-26 DIAGNOSIS — I272 Pulmonary hypertension, unspecified: Secondary | ICD-10-CM | POA: Diagnosis not present

## 2021-07-26 DIAGNOSIS — Z9181 History of falling: Secondary | ICD-10-CM | POA: Diagnosis not present

## 2021-07-26 DIAGNOSIS — M5416 Radiculopathy, lumbar region: Secondary | ICD-10-CM | POA: Diagnosis not present

## 2021-07-26 DIAGNOSIS — Z992 Dependence on renal dialysis: Secondary | ICD-10-CM | POA: Diagnosis not present

## 2021-07-26 DIAGNOSIS — D509 Iron deficiency anemia, unspecified: Secondary | ICD-10-CM | POA: Diagnosis not present

## 2021-07-26 DIAGNOSIS — M5136 Other intervertebral disc degeneration, lumbar region: Secondary | ICD-10-CM | POA: Diagnosis not present

## 2021-07-26 NOTE — Progress Notes (Signed)
Chief Complaint  Patient presents with   Nail Problem    The right big toenail is not healed and there is a scab and shooting pain goes up to the toe and there is not any draining and I was using the betadine     HPI: Patient is 66 y.o. male who presents today for recheck of the medial nail border of the right great toenail resicdual spicule that was previously removed via a phenol matrixectomy last week by Dr. Cannon Kettle.  He relates he has pain in the corner where the nail was removed and there is a dark area that has not yet healed.   Past Medical History:  Diagnosis Date   Ankylosis of lumbar spine 02/21/2014   Benign essential hypertension 10/26/2015   Chronic diastolic heart failure ([place]) 09/26/2016   Chronic pain associated with significant psychosocial dysfunction 06/17/2015   Degeneration of intervertebral disc of lumbar region 07/05/2013   ESRD (end stage renal disease) on dialysis ([place]) 03/27/2017   Hearing loss    Hepatocellular carcinoma ([place]) 12/27/2010   Overview:  Embolized 6/12    History of liver transplant ([place]) 07/20/2011   Overview:  06/07/2011 for cryptogenic cirrhosis and HCC    Uncontrolled type 2 diabetes mellitus with insulin therapy 06/21/2018     Allergies  Allergen Reactions   Iodinated Contrast Media Hives and Other (See Comments)    Allergy is NOT to all contrast - but patient is unsure of which particular one his allergy is to. Contrast dye used before liver transplant cause hives, not sure which dye this was but is able to use others   Jardiance [Empagliflozin] Dermatitis    developed blisters with the medication, blisters  stopped after he discontinued taking it. (About 6 months ago /~06/2020)   Latex Other (See Comments)    Skin peeling - adhesive    Metformin And Related Nausea And Vomiting   Rosiglitazone Nausea And Vomiting    GI effects and abdominal pain    Review of systems is negative except as noted in the HPI.  Denies nausea/ vomiting/  fevers/ chills or night sweats.   Denies difficulty breathing, denies calf pain or tenderness  Physical Exam  Patient is awake, alert, and oriented x 3.  In no acute distress.    Vascular status is intact with palpable pedal pulses 1/4 DP and 0/4  PT bilateral and capillary refill time less than 3 seconds bilateral.  No edema or erythema noted.   Neurological exam reveals epicritic and protective sensation grossly intact bilateral.   Dermatological exam reveals skin is supple and dry to bilateral feet.    Right hallux medial nail border has an area of small scab formation on the proximal corner of the nail fold. No redness, no swelling, no streaking, no drainage is noted. No malodor or concern for infection is present.    Assessment:   ICD-10-CM   1. Status post surgical removal of nail matrix of toe  Z98.890        Plan: I recommended he continue soaking as well as the use of peroxide to help slowly resolve the scab.  He will continue to use betadine and neosporin until fully healed.  Patient to call if increased redness, swelling or signs of infection arise.

## 2021-07-27 ENCOUNTER — Ambulatory Visit (HOSPITAL_COMMUNITY)
Admission: RE | Admit: 2021-07-27 | Discharge: 2021-07-27 | Disposition: A | Discharge status: Home / Self Care | Payer: Medicare Other | Source: Ambulatory Visit | Attending: Cardiology | Admitting: Cardiology

## 2021-07-27 DIAGNOSIS — K7581 Nonalcoholic steatohepatitis (NASH): Secondary | ICD-10-CM | POA: Diagnosis not present

## 2021-07-27 DIAGNOSIS — R188 Other ascites: Secondary | ICD-10-CM | POA: Diagnosis not present

## 2021-07-27 DIAGNOSIS — Z8505 Personal history of malignant neoplasm of liver: Secondary | ICD-10-CM | POA: Diagnosis not present

## 2021-07-27 HISTORY — PX: IR PARACENTESIS: IMG2679

## 2021-07-27 MED ORDER — LIDOCAINE HCL 1 % IJ SOLN
INTRAMUSCULAR | Status: AC
  Filled 2021-07-27: qty 20

## 2021-07-27 MED ORDER — LIDOCAINE HCL (PF) 1 % IJ SOLN
INTRAMUSCULAR | Status: DC | PRN
  Administered 2021-07-27: 10 mL

## 2021-07-27 NOTE — Procedures (Signed)
PROCEDURE SUMMARY:  Successful image-guided paracentesis from the right upper abdomen.  Yielded 2.8 liters of hazy yellow fluid.  No immediate complications.  EBL < 1 mL Patient tolerated well.   Specimen was not sent for labs.  Please see imaging section of Epic for full dictation.  Joaquim Nam PA-C 07/27/2021 9:04 AM

## 2021-07-28 ENCOUNTER — Telehealth: Payer: Self-pay

## 2021-07-28 DIAGNOSIS — D509 Iron deficiency anemia, unspecified: Secondary | ICD-10-CM | POA: Diagnosis not present

## 2021-07-28 DIAGNOSIS — N186 End stage renal disease: Secondary | ICD-10-CM | POA: Diagnosis not present

## 2021-07-28 DIAGNOSIS — N2581 Secondary hyperparathyroidism of renal origin: Secondary | ICD-10-CM | POA: Diagnosis not present

## 2021-07-28 DIAGNOSIS — Z992 Dependence on renal dialysis: Secondary | ICD-10-CM | POA: Diagnosis not present

## 2021-07-28 NOTE — Telephone Encounter (Signed)
Pt called with c/o neck stiffness from the nerve block administered during his fistula creation on 6/2 and wanted to see if this was normal.  Spoke with Georgina Snell PA who recommended reaching out to anesthesiology. Sent a staff msg to Karoline Caldwell PA for advice.  Returned pt's call, two identifiers used. Informed pt of status and waiting for recommendations from [facility]. Pt confirmed understanding.

## 2021-07-28 NOTE — Telephone Encounter (Signed)
Called pt to relay msg from [place] with anesthesiology, no answer, left vm as requested by pt.   Rica Koyanagi, PA-C  Desiree Daise, Vivien Presto, RN Hey I spoke with the anesthesiologists. They advised that there's really no reason a supraclavicular block should be causing any neck stiffness. Recommendation was for him to be seen by PCP to evaluate other potential musculoskeletal causes.

## 2021-07-30 ENCOUNTER — Other Ambulatory Visit (HOSPITAL_COMMUNITY): Payer: Self-pay | Admitting: Cardiology

## 2021-07-30 ENCOUNTER — Other Ambulatory Visit: Payer: Self-pay | Admitting: Cardiology

## 2021-07-30 ENCOUNTER — Ambulatory Visit (HOSPITAL_COMMUNITY)
Admission: RE | Admit: 2021-07-30 | Discharge: 2021-07-30 | Disposition: A | Discharge status: Home / Self Care | Payer: Medicare Other | Source: Ambulatory Visit | Attending: Cardiology | Admitting: Cardiology

## 2021-07-30 DIAGNOSIS — R188 Other ascites: Secondary | ICD-10-CM | POA: Diagnosis not present

## 2021-07-30 DIAGNOSIS — I50810 Right heart failure, unspecified: Secondary | ICD-10-CM | POA: Diagnosis not present

## 2021-07-30 DIAGNOSIS — I5033 Acute on chronic diastolic (congestive) heart failure: Secondary | ICD-10-CM | POA: Diagnosis not present

## 2021-07-30 DIAGNOSIS — M5416 Radiculopathy, lumbar region: Secondary | ICD-10-CM | POA: Diagnosis not present

## 2021-07-30 DIAGNOSIS — Z944 Liver transplant status: Secondary | ICD-10-CM | POA: Diagnosis not present

## 2021-07-30 DIAGNOSIS — Z992 Dependence on renal dialysis: Secondary | ICD-10-CM | POA: Diagnosis not present

## 2021-07-30 DIAGNOSIS — N186 End stage renal disease: Secondary | ICD-10-CM | POA: Diagnosis not present

## 2021-07-30 DIAGNOSIS — Z9181 History of falling: Secondary | ICD-10-CM | POA: Diagnosis not present

## 2021-07-30 DIAGNOSIS — E8770 Fluid overload, unspecified: Secondary | ICD-10-CM | POA: Diagnosis not present

## 2021-07-30 DIAGNOSIS — I890 Lymphedema, not elsewhere classified: Secondary | ICD-10-CM | POA: Diagnosis not present

## 2021-07-30 DIAGNOSIS — K7581 Nonalcoholic steatohepatitis (NASH): Secondary | ICD-10-CM | POA: Diagnosis not present

## 2021-07-30 DIAGNOSIS — E1122 Type 2 diabetes mellitus with diabetic chronic kidney disease: Secondary | ICD-10-CM | POA: Diagnosis not present

## 2021-07-30 DIAGNOSIS — M5136 Other intervertebral disc degeneration, lumbar region: Secondary | ICD-10-CM | POA: Diagnosis not present

## 2021-07-30 DIAGNOSIS — Z794 Long term (current) use of insulin: Secondary | ICD-10-CM | POA: Diagnosis not present

## 2021-07-30 DIAGNOSIS — I071 Rheumatic tricuspid insufficiency: Secondary | ICD-10-CM | POA: Diagnosis not present

## 2021-07-30 DIAGNOSIS — I272 Pulmonary hypertension, unspecified: Secondary | ICD-10-CM | POA: Diagnosis not present

## 2021-07-30 DIAGNOSIS — Z8505 Personal history of malignant neoplasm of liver: Secondary | ICD-10-CM | POA: Diagnosis not present

## 2021-07-30 DIAGNOSIS — I132 Hypertensive heart and chronic kidney disease with heart failure and with stage 5 chronic kidney disease, or end stage renal disease: Secondary | ICD-10-CM | POA: Diagnosis not present

## 2021-07-30 HISTORY — PX: IR PARACENTESIS: IMG2679

## 2021-07-30 MED ORDER — LIDOCAINE HCL 1 % IJ SOLN
INTRAMUSCULAR | Status: AC
  Filled 2021-07-30: qty 20

## 2021-07-30 MED ORDER — LIDOCAINE HCL 1 % IJ SOLN
INTRAMUSCULAR | Status: DC | PRN
  Administered 2021-07-30: 10 mL via INTRADERMAL

## 2021-07-30 NOTE — Procedures (Signed)
PROCEDURE SUMMARY:  Successful US guided paracentesis from left abdomen.  Yielded 3L  of red fluid.  No immediate complications.  Pt tolerated well.   Specimen not sent for labs.  EBL < 2 mL  Theresa Duty, NP 07/30/2021 3:47 PM

## 2021-07-31 DIAGNOSIS — N2581 Secondary hyperparathyroidism of renal origin: Secondary | ICD-10-CM | POA: Diagnosis not present

## 2021-07-31 DIAGNOSIS — Z992 Dependence on renal dialysis: Secondary | ICD-10-CM | POA: Diagnosis not present

## 2021-07-31 DIAGNOSIS — D509 Iron deficiency anemia, unspecified: Secondary | ICD-10-CM | POA: Diagnosis not present

## 2021-07-31 DIAGNOSIS — N186 End stage renal disease: Secondary | ICD-10-CM | POA: Diagnosis not present

## 2021-08-02 ENCOUNTER — Ambulatory Visit (HOSPITAL_COMMUNITY)
Admission: RE | Admit: 2021-08-02 | Discharge: 2021-08-02 | Disposition: A | Discharge status: Home / Self Care | Payer: Medicare Other | Source: Ambulatory Visit | Attending: Cardiology | Admitting: Cardiology

## 2021-08-02 DIAGNOSIS — K7581 Nonalcoholic steatohepatitis (NASH): Secondary | ICD-10-CM | POA: Diagnosis not present

## 2021-08-02 DIAGNOSIS — M5136 Other intervertebral disc degeneration, lumbar region: Secondary | ICD-10-CM | POA: Diagnosis not present

## 2021-08-02 DIAGNOSIS — Z8505 Personal history of malignant neoplasm of liver: Secondary | ICD-10-CM | POA: Diagnosis not present

## 2021-08-02 DIAGNOSIS — I50810 Right heart failure, unspecified: Secondary | ICD-10-CM | POA: Diagnosis not present

## 2021-08-02 DIAGNOSIS — E1122 Type 2 diabetes mellitus with diabetic chronic kidney disease: Secondary | ICD-10-CM | POA: Diagnosis not present

## 2021-08-02 DIAGNOSIS — I5033 Acute on chronic diastolic (congestive) heart failure: Secondary | ICD-10-CM | POA: Diagnosis not present

## 2021-08-02 DIAGNOSIS — I890 Lymphedema, not elsewhere classified: Secondary | ICD-10-CM | POA: Diagnosis not present

## 2021-08-02 DIAGNOSIS — Z944 Liver transplant status: Secondary | ICD-10-CM | POA: Diagnosis not present

## 2021-08-02 DIAGNOSIS — I071 Rheumatic tricuspid insufficiency: Secondary | ICD-10-CM | POA: Diagnosis not present

## 2021-08-02 DIAGNOSIS — I272 Pulmonary hypertension, unspecified: Secondary | ICD-10-CM | POA: Diagnosis not present

## 2021-08-02 DIAGNOSIS — R188 Other ascites: Secondary | ICD-10-CM | POA: Insufficient documentation

## 2021-08-02 DIAGNOSIS — M5416 Radiculopathy, lumbar region: Secondary | ICD-10-CM | POA: Diagnosis not present

## 2021-08-02 DIAGNOSIS — Z9181 History of falling: Secondary | ICD-10-CM | POA: Diagnosis not present

## 2021-08-02 DIAGNOSIS — I132 Hypertensive heart and chronic kidney disease with heart failure and with stage 5 chronic kidney disease, or end stage renal disease: Secondary | ICD-10-CM | POA: Diagnosis not present

## 2021-08-02 DIAGNOSIS — E8770 Fluid overload, unspecified: Secondary | ICD-10-CM | POA: Diagnosis not present

## 2021-08-02 DIAGNOSIS — Z992 Dependence on renal dialysis: Secondary | ICD-10-CM | POA: Diagnosis not present

## 2021-08-02 DIAGNOSIS — Z794 Long term (current) use of insulin: Secondary | ICD-10-CM | POA: Diagnosis not present

## 2021-08-02 DIAGNOSIS — N186 End stage renal disease: Secondary | ICD-10-CM | POA: Diagnosis not present

## 2021-08-02 HISTORY — PX: IR PARACENTESIS: IMG2679

## 2021-08-02 MED ORDER — LIDOCAINE HCL (PF) 1 % IJ SOLN
INTRAMUSCULAR | Status: DC | PRN
  Administered 2021-08-02: 10 mL

## 2021-08-02 MED ORDER — LIDOCAINE HCL 1 % IJ SOLN
INTRAMUSCULAR | Status: AC
  Filled 2021-08-02: qty 20

## 2021-08-02 NOTE — Procedures (Signed)
PROCEDURE SUMMARY:  Successful US guided paracentesis from right abdomen.  Yielded 3 L of light red fluid.  No immediate complications.  Pt tolerated well.   Specimen not sent for labs.  EBL < 2 mL  Theresa Duty, NP 08/02/2021 1:14 PM

## 2021-08-03 DIAGNOSIS — N2581 Secondary hyperparathyroidism of renal origin: Secondary | ICD-10-CM | POA: Diagnosis not present

## 2021-08-03 DIAGNOSIS — D509 Iron deficiency anemia, unspecified: Secondary | ICD-10-CM | POA: Diagnosis not present

## 2021-08-03 DIAGNOSIS — N186 End stage renal disease: Secondary | ICD-10-CM | POA: Diagnosis not present

## 2021-08-03 DIAGNOSIS — Z992 Dependence on renal dialysis: Secondary | ICD-10-CM | POA: Diagnosis not present

## 2021-08-04 DIAGNOSIS — N186 End stage renal disease: Secondary | ICD-10-CM | POA: Diagnosis not present

## 2021-08-04 DIAGNOSIS — D509 Iron deficiency anemia, unspecified: Secondary | ICD-10-CM | POA: Diagnosis not present

## 2021-08-04 DIAGNOSIS — Z992 Dependence on renal dialysis: Secondary | ICD-10-CM | POA: Diagnosis not present

## 2021-08-04 DIAGNOSIS — N2581 Secondary hyperparathyroidism of renal origin: Secondary | ICD-10-CM | POA: Diagnosis not present

## 2021-08-06 DIAGNOSIS — Z794 Long term (current) use of insulin: Secondary | ICD-10-CM | POA: Diagnosis not present

## 2021-08-06 DIAGNOSIS — M5416 Radiculopathy, lumbar region: Secondary | ICD-10-CM | POA: Diagnosis not present

## 2021-08-06 DIAGNOSIS — M5136 Other intervertebral disc degeneration, lumbar region: Secondary | ICD-10-CM | POA: Diagnosis not present

## 2021-08-06 DIAGNOSIS — I272 Pulmonary hypertension, unspecified: Secondary | ICD-10-CM | POA: Diagnosis not present

## 2021-08-06 DIAGNOSIS — N2581 Secondary hyperparathyroidism of renal origin: Secondary | ICD-10-CM | POA: Diagnosis not present

## 2021-08-06 DIAGNOSIS — E8770 Fluid overload, unspecified: Secondary | ICD-10-CM | POA: Diagnosis not present

## 2021-08-06 DIAGNOSIS — N186 End stage renal disease: Secondary | ICD-10-CM | POA: Diagnosis not present

## 2021-08-06 DIAGNOSIS — E1122 Type 2 diabetes mellitus with diabetic chronic kidney disease: Secondary | ICD-10-CM | POA: Diagnosis not present

## 2021-08-06 DIAGNOSIS — Z992 Dependence on renal dialysis: Secondary | ICD-10-CM | POA: Diagnosis not present

## 2021-08-06 DIAGNOSIS — Z944 Liver transplant status: Secondary | ICD-10-CM | POA: Diagnosis not present

## 2021-08-06 DIAGNOSIS — I071 Rheumatic tricuspid insufficiency: Secondary | ICD-10-CM | POA: Diagnosis not present

## 2021-08-06 DIAGNOSIS — I5033 Acute on chronic diastolic (congestive) heart failure: Secondary | ICD-10-CM | POA: Diagnosis not present

## 2021-08-06 DIAGNOSIS — I132 Hypertensive heart and chronic kidney disease with heart failure and with stage 5 chronic kidney disease, or end stage renal disease: Secondary | ICD-10-CM | POA: Diagnosis not present

## 2021-08-06 DIAGNOSIS — Z9181 History of falling: Secondary | ICD-10-CM | POA: Diagnosis not present

## 2021-08-06 DIAGNOSIS — I890 Lymphedema, not elsewhere classified: Secondary | ICD-10-CM | POA: Diagnosis not present

## 2021-08-06 DIAGNOSIS — D509 Iron deficiency anemia, unspecified: Secondary | ICD-10-CM | POA: Diagnosis not present

## 2021-08-06 DIAGNOSIS — I50810 Right heart failure, unspecified: Secondary | ICD-10-CM | POA: Diagnosis not present

## 2021-08-07 ENCOUNTER — Other Ambulatory Visit: Payer: Self-pay

## 2021-08-07 DIAGNOSIS — N186 End stage renal disease: Secondary | ICD-10-CM

## 2021-08-09 ENCOUNTER — Ambulatory Visit (HOSPITAL_COMMUNITY)
Admission: RE | Admit: 2021-08-09 | Discharge: 2021-08-09 | Disposition: A | Discharge status: Home / Self Care | Payer: Medicare Other | Source: Ambulatory Visit | Attending: Cardiology | Admitting: Cardiology

## 2021-08-09 DIAGNOSIS — I5033 Acute on chronic diastolic (congestive) heart failure: Secondary | ICD-10-CM | POA: Diagnosis not present

## 2021-08-09 DIAGNOSIS — N186 End stage renal disease: Secondary | ICD-10-CM | POA: Diagnosis not present

## 2021-08-09 DIAGNOSIS — M5416 Radiculopathy, lumbar region: Secondary | ICD-10-CM | POA: Diagnosis not present

## 2021-08-09 DIAGNOSIS — N2581 Secondary hyperparathyroidism of renal origin: Secondary | ICD-10-CM | POA: Diagnosis not present

## 2021-08-09 DIAGNOSIS — R188 Other ascites: Secondary | ICD-10-CM | POA: Diagnosis not present

## 2021-08-09 DIAGNOSIS — E8770 Fluid overload, unspecified: Secondary | ICD-10-CM | POA: Diagnosis not present

## 2021-08-09 DIAGNOSIS — D509 Iron deficiency anemia, unspecified: Secondary | ICD-10-CM | POA: Diagnosis not present

## 2021-08-09 DIAGNOSIS — I272 Pulmonary hypertension, unspecified: Secondary | ICD-10-CM | POA: Diagnosis not present

## 2021-08-09 DIAGNOSIS — M5136 Other intervertebral disc degeneration, lumbar region: Secondary | ICD-10-CM | POA: Diagnosis not present

## 2021-08-09 DIAGNOSIS — I071 Rheumatic tricuspid insufficiency: Secondary | ICD-10-CM | POA: Diagnosis not present

## 2021-08-09 DIAGNOSIS — Z9181 History of falling: Secondary | ICD-10-CM | POA: Diagnosis not present

## 2021-08-09 DIAGNOSIS — I132 Hypertensive heart and chronic kidney disease with heart failure and with stage 5 chronic kidney disease, or end stage renal disease: Secondary | ICD-10-CM | POA: Diagnosis not present

## 2021-08-09 DIAGNOSIS — Z992 Dependence on renal dialysis: Secondary | ICD-10-CM | POA: Diagnosis not present

## 2021-08-09 DIAGNOSIS — E1122 Type 2 diabetes mellitus with diabetic chronic kidney disease: Secondary | ICD-10-CM | POA: Diagnosis not present

## 2021-08-09 DIAGNOSIS — Z944 Liver transplant status: Secondary | ICD-10-CM | POA: Diagnosis not present

## 2021-08-09 DIAGNOSIS — Z794 Long term (current) use of insulin: Secondary | ICD-10-CM | POA: Diagnosis not present

## 2021-08-09 DIAGNOSIS — I50810 Right heart failure, unspecified: Secondary | ICD-10-CM | POA: Diagnosis not present

## 2021-08-09 DIAGNOSIS — I890 Lymphedema, not elsewhere classified: Secondary | ICD-10-CM | POA: Diagnosis not present

## 2021-08-09 HISTORY — PX: IR PARACENTESIS: IMG2679

## 2021-08-09 MED ORDER — LIDOCAINE HCL 1 % IJ SOLN
INTRAMUSCULAR | Status: AC
  Administered 2021-08-09: 10 mL
  Filled 2021-08-09: qty 20

## 2021-08-11 DIAGNOSIS — D509 Iron deficiency anemia, unspecified: Secondary | ICD-10-CM | POA: Diagnosis not present

## 2021-08-11 DIAGNOSIS — N186 End stage renal disease: Secondary | ICD-10-CM | POA: Diagnosis not present

## 2021-08-11 DIAGNOSIS — Z992 Dependence on renal dialysis: Secondary | ICD-10-CM | POA: Diagnosis not present

## 2021-08-11 DIAGNOSIS — N2581 Secondary hyperparathyroidism of renal origin: Secondary | ICD-10-CM | POA: Diagnosis not present

## 2021-08-12 DIAGNOSIS — I5020 Unspecified systolic (congestive) heart failure: Secondary | ICD-10-CM | POA: Diagnosis not present

## 2021-08-12 DIAGNOSIS — R188 Other ascites: Secondary | ICD-10-CM | POA: Diagnosis not present

## 2021-08-12 DIAGNOSIS — I132 Hypertensive heart and chronic kidney disease with heart failure and with stage 5 chronic kidney disease, or end stage renal disease: Secondary | ICD-10-CM | POA: Diagnosis not present

## 2021-08-12 DIAGNOSIS — I509 Heart failure, unspecified: Secondary | ICD-10-CM | POA: Diagnosis not present

## 2021-08-12 DIAGNOSIS — N186 End stage renal disease: Secondary | ICD-10-CM | POA: Diagnosis not present

## 2021-08-13 DIAGNOSIS — Z794 Long term (current) use of insulin: Secondary | ICD-10-CM | POA: Diagnosis not present

## 2021-08-13 DIAGNOSIS — E1122 Type 2 diabetes mellitus with diabetic chronic kidney disease: Secondary | ICD-10-CM | POA: Diagnosis not present

## 2021-08-13 DIAGNOSIS — I272 Pulmonary hypertension, unspecified: Secondary | ICD-10-CM | POA: Diagnosis not present

## 2021-08-13 DIAGNOSIS — N186 End stage renal disease: Secondary | ICD-10-CM | POA: Diagnosis not present

## 2021-08-13 DIAGNOSIS — I5033 Acute on chronic diastolic (congestive) heart failure: Secondary | ICD-10-CM | POA: Diagnosis not present

## 2021-08-13 DIAGNOSIS — D509 Iron deficiency anemia, unspecified: Secondary | ICD-10-CM | POA: Diagnosis not present

## 2021-08-13 DIAGNOSIS — M5136 Other intervertebral disc degeneration, lumbar region: Secondary | ICD-10-CM | POA: Diagnosis not present

## 2021-08-13 DIAGNOSIS — I132 Hypertensive heart and chronic kidney disease with heart failure and with stage 5 chronic kidney disease, or end stage renal disease: Secondary | ICD-10-CM | POA: Diagnosis not present

## 2021-08-13 DIAGNOSIS — N179 Acute kidney failure, unspecified: Secondary | ICD-10-CM | POA: Diagnosis not present

## 2021-08-13 DIAGNOSIS — E8770 Fluid overload, unspecified: Secondary | ICD-10-CM | POA: Diagnosis not present

## 2021-08-13 DIAGNOSIS — I890 Lymphedema, not elsewhere classified: Secondary | ICD-10-CM | POA: Diagnosis not present

## 2021-08-13 DIAGNOSIS — Z992 Dependence on renal dialysis: Secondary | ICD-10-CM | POA: Diagnosis not present

## 2021-08-13 DIAGNOSIS — Z9181 History of falling: Secondary | ICD-10-CM | POA: Diagnosis not present

## 2021-08-13 DIAGNOSIS — M5416 Radiculopathy, lumbar region: Secondary | ICD-10-CM | POA: Diagnosis not present

## 2021-08-13 DIAGNOSIS — N2581 Secondary hyperparathyroidism of renal origin: Secondary | ICD-10-CM | POA: Diagnosis not present

## 2021-08-13 DIAGNOSIS — I50810 Right heart failure, unspecified: Secondary | ICD-10-CM | POA: Diagnosis not present

## 2021-08-13 DIAGNOSIS — I071 Rheumatic tricuspid insufficiency: Secondary | ICD-10-CM | POA: Diagnosis not present

## 2021-08-13 DIAGNOSIS — Z944 Liver transplant status: Secondary | ICD-10-CM | POA: Diagnosis not present

## 2021-08-16 DIAGNOSIS — I071 Rheumatic tricuspid insufficiency: Secondary | ICD-10-CM | POA: Diagnosis not present

## 2021-08-16 DIAGNOSIS — M5416 Radiculopathy, lumbar region: Secondary | ICD-10-CM | POA: Diagnosis not present

## 2021-08-16 DIAGNOSIS — E1122 Type 2 diabetes mellitus with diabetic chronic kidney disease: Secondary | ICD-10-CM | POA: Diagnosis not present

## 2021-08-16 DIAGNOSIS — D509 Iron deficiency anemia, unspecified: Secondary | ICD-10-CM | POA: Diagnosis not present

## 2021-08-16 DIAGNOSIS — N186 End stage renal disease: Secondary | ICD-10-CM | POA: Diagnosis not present

## 2021-08-16 DIAGNOSIS — Z992 Dependence on renal dialysis: Secondary | ICD-10-CM | POA: Diagnosis not present

## 2021-08-16 DIAGNOSIS — E8770 Fluid overload, unspecified: Secondary | ICD-10-CM | POA: Diagnosis not present

## 2021-08-16 DIAGNOSIS — I132 Hypertensive heart and chronic kidney disease with heart failure and with stage 5 chronic kidney disease, or end stage renal disease: Secondary | ICD-10-CM | POA: Diagnosis not present

## 2021-08-16 DIAGNOSIS — I890 Lymphedema, not elsewhere classified: Secondary | ICD-10-CM | POA: Diagnosis not present

## 2021-08-16 DIAGNOSIS — I5020 Unspecified systolic (congestive) heart failure: Secondary | ICD-10-CM | POA: Diagnosis not present

## 2021-08-16 DIAGNOSIS — Z794 Long term (current) use of insulin: Secondary | ICD-10-CM | POA: Diagnosis not present

## 2021-08-16 DIAGNOSIS — I272 Pulmonary hypertension, unspecified: Secondary | ICD-10-CM | POA: Diagnosis not present

## 2021-08-16 DIAGNOSIS — M5136 Other intervertebral disc degeneration, lumbar region: Secondary | ICD-10-CM | POA: Diagnosis not present

## 2021-08-16 DIAGNOSIS — Z9181 History of falling: Secondary | ICD-10-CM | POA: Diagnosis not present

## 2021-08-16 DIAGNOSIS — I50810 Right heart failure, unspecified: Secondary | ICD-10-CM | POA: Diagnosis not present

## 2021-08-16 DIAGNOSIS — N2581 Secondary hyperparathyroidism of renal origin: Secondary | ICD-10-CM | POA: Diagnosis not present

## 2021-08-16 DIAGNOSIS — Z944 Liver transplant status: Secondary | ICD-10-CM | POA: Diagnosis not present

## 2021-08-16 DIAGNOSIS — I5033 Acute on chronic diastolic (congestive) heart failure: Secondary | ICD-10-CM | POA: Diagnosis not present

## 2021-08-16 DIAGNOSIS — R188 Other ascites: Secondary | ICD-10-CM | POA: Diagnosis not present

## 2021-08-16 DIAGNOSIS — D689 Coagulation defect, unspecified: Secondary | ICD-10-CM | POA: Diagnosis not present

## 2021-08-18 DIAGNOSIS — Z992 Dependence on renal dialysis: Secondary | ICD-10-CM | POA: Diagnosis not present

## 2021-08-18 DIAGNOSIS — D689 Coagulation defect, unspecified: Secondary | ICD-10-CM | POA: Diagnosis not present

## 2021-08-18 DIAGNOSIS — D509 Iron deficiency anemia, unspecified: Secondary | ICD-10-CM | POA: Diagnosis not present

## 2021-08-18 DIAGNOSIS — N186 End stage renal disease: Secondary | ICD-10-CM | POA: Diagnosis not present

## 2021-08-18 DIAGNOSIS — N2581 Secondary hyperparathyroidism of renal origin: Secondary | ICD-10-CM | POA: Diagnosis not present

## 2021-08-18 DIAGNOSIS — E1122 Type 2 diabetes mellitus with diabetic chronic kidney disease: Secondary | ICD-10-CM | POA: Diagnosis not present

## 2021-08-19 ENCOUNTER — Emergency Department (HOSPITAL_COMMUNITY): Payer: Medicare Other

## 2021-08-19 ENCOUNTER — Inpatient Hospital Stay (HOSPITAL_COMMUNITY)
Admission: EM | Admit: 2021-08-19 | Discharge: 2021-08-29 | DRG: 308 | Disposition: A | Discharge status: Home Health | Payer: Medicare Other | Attending: Internal Medicine | Admitting: Internal Medicine

## 2021-08-19 ENCOUNTER — Other Ambulatory Visit: Payer: Self-pay

## 2021-08-19 ENCOUNTER — Encounter (HOSPITAL_COMMUNITY): Payer: Self-pay

## 2021-08-19 DIAGNOSIS — I071 Rheumatic tricuspid insufficiency: Secondary | ICD-10-CM | POA: Diagnosis present

## 2021-08-19 DIAGNOSIS — Z794 Long term (current) use of insulin: Secondary | ICD-10-CM

## 2021-08-19 DIAGNOSIS — D631 Anemia in chronic kidney disease: Secondary | ICD-10-CM | POA: Diagnosis present

## 2021-08-19 DIAGNOSIS — Z944 Liver transplant status: Secondary | ICD-10-CM

## 2021-08-19 DIAGNOSIS — I483 Typical atrial flutter: Secondary | ICD-10-CM | POA: Diagnosis not present

## 2021-08-19 DIAGNOSIS — I471 Supraventricular tachycardia: Principal | ICD-10-CM | POA: Diagnosis present

## 2021-08-19 DIAGNOSIS — Z8249 Family history of ischemic heart disease and other diseases of the circulatory system: Secondary | ICD-10-CM

## 2021-08-19 DIAGNOSIS — G894 Chronic pain syndrome: Secondary | ICD-10-CM | POA: Diagnosis present

## 2021-08-19 DIAGNOSIS — E1122 Type 2 diabetes mellitus with diabetic chronic kidney disease: Secondary | ICD-10-CM | POA: Diagnosis present

## 2021-08-19 DIAGNOSIS — E8770 Fluid overload, unspecified: Secondary | ICD-10-CM

## 2021-08-19 DIAGNOSIS — I5033 Acute on chronic diastolic (congestive) heart failure: Secondary | ICD-10-CM | POA: Diagnosis not present

## 2021-08-19 DIAGNOSIS — R000 Tachycardia, unspecified: Secondary | ICD-10-CM | POA: Diagnosis not present

## 2021-08-19 DIAGNOSIS — Z7969 Long term (current) use of other immunomodulators and immunosuppressants: Secondary | ICD-10-CM

## 2021-08-19 DIAGNOSIS — I132 Hypertensive heart and chronic kidney disease with heart failure and with stage 5 chronic kidney disease, or end stage renal disease: Secondary | ICD-10-CM | POA: Diagnosis not present

## 2021-08-19 DIAGNOSIS — I4892 Unspecified atrial flutter: Secondary | ICD-10-CM | POA: Diagnosis present

## 2021-08-19 DIAGNOSIS — E871 Hypo-osmolality and hyponatremia: Secondary | ICD-10-CM | POA: Diagnosis present

## 2021-08-19 DIAGNOSIS — Z888 Allergy status to other drugs, medicaments and biological substances status: Secondary | ICD-10-CM

## 2021-08-19 DIAGNOSIS — E785 Hyperlipidemia, unspecified: Secondary | ICD-10-CM | POA: Diagnosis present

## 2021-08-19 DIAGNOSIS — N289 Disorder of kidney and ureter, unspecified: Secondary | ICD-10-CM | POA: Diagnosis not present

## 2021-08-19 DIAGNOSIS — I2781 Cor pulmonale (chronic): Secondary | ICD-10-CM | POA: Diagnosis present

## 2021-08-19 DIAGNOSIS — Z8505 Personal history of malignant neoplasm of liver: Secondary | ICD-10-CM

## 2021-08-19 DIAGNOSIS — R0989 Other specified symptoms and signs involving the circulatory and respiratory systems: Secondary | ICD-10-CM | POA: Diagnosis present

## 2021-08-19 DIAGNOSIS — E8889 Other specified metabolic disorders: Secondary | ICD-10-CM | POA: Diagnosis not present

## 2021-08-19 DIAGNOSIS — R008 Other abnormalities of heart beat: Secondary | ICD-10-CM | POA: Diagnosis present

## 2021-08-19 DIAGNOSIS — I272 Pulmonary hypertension, unspecified: Secondary | ICD-10-CM | POA: Diagnosis not present

## 2021-08-19 DIAGNOSIS — D696 Thrombocytopenia, unspecified: Secondary | ICD-10-CM | POA: Diagnosis present

## 2021-08-19 DIAGNOSIS — I953 Hypotension of hemodialysis: Secondary | ICD-10-CM | POA: Diagnosis present

## 2021-08-19 DIAGNOSIS — I9589 Other hypotension: Secondary | ICD-10-CM | POA: Diagnosis present

## 2021-08-19 DIAGNOSIS — Z91040 Latex allergy status: Secondary | ICD-10-CM

## 2021-08-19 DIAGNOSIS — K7290 Hepatic failure, unspecified without coma: Secondary | ICD-10-CM | POA: Diagnosis present

## 2021-08-19 DIAGNOSIS — I5043 Acute on chronic combined systolic (congestive) and diastolic (congestive) heart failure: Secondary | ICD-10-CM | POA: Diagnosis present

## 2021-08-19 DIAGNOSIS — Z7189 Other specified counseling: Secondary | ICD-10-CM | POA: Diagnosis not present

## 2021-08-19 DIAGNOSIS — I5083 High output heart failure: Secondary | ICD-10-CM | POA: Diagnosis present

## 2021-08-19 DIAGNOSIS — E11649 Type 2 diabetes mellitus with hypoglycemia without coma: Secondary | ICD-10-CM | POA: Diagnosis not present

## 2021-08-19 DIAGNOSIS — I959 Hypotension, unspecified: Secondary | ICD-10-CM | POA: Diagnosis not present

## 2021-08-19 DIAGNOSIS — E119 Type 2 diabetes mellitus without complications: Secondary | ICD-10-CM

## 2021-08-19 DIAGNOSIS — H9190 Unspecified hearing loss, unspecified ear: Secondary | ICD-10-CM | POA: Diagnosis present

## 2021-08-19 DIAGNOSIS — Z87820 Personal history of traumatic brain injury: Secondary | ICD-10-CM

## 2021-08-19 DIAGNOSIS — N2581 Secondary hyperparathyroidism of renal origin: Secondary | ICD-10-CM | POA: Diagnosis present

## 2021-08-19 DIAGNOSIS — K7581 Nonalcoholic steatohepatitis (NASH): Secondary | ICD-10-CM | POA: Diagnosis present

## 2021-08-19 DIAGNOSIS — R079 Chest pain, unspecified: Secondary | ICD-10-CM | POA: Diagnosis not present

## 2021-08-19 DIAGNOSIS — R0602 Shortness of breath: Secondary | ICD-10-CM | POA: Diagnosis not present

## 2021-08-19 DIAGNOSIS — Z881 Allergy status to other antibiotic agents status: Secondary | ICD-10-CM

## 2021-08-19 DIAGNOSIS — I4589 Other specified conduction disorders: Secondary | ICD-10-CM | POA: Diagnosis present

## 2021-08-19 DIAGNOSIS — I491 Atrial premature depolarization: Secondary | ICD-10-CM | POA: Diagnosis not present

## 2021-08-19 DIAGNOSIS — Z992 Dependence on renal dialysis: Secondary | ICD-10-CM

## 2021-08-19 DIAGNOSIS — Z79899 Other long term (current) drug therapy: Secondary | ICD-10-CM

## 2021-08-19 DIAGNOSIS — N281 Cyst of kidney, acquired: Secondary | ICD-10-CM | POA: Diagnosis not present

## 2021-08-19 DIAGNOSIS — Z91041 Radiographic dye allergy status: Secondary | ICD-10-CM

## 2021-08-19 DIAGNOSIS — R0789 Other chest pain: Secondary | ICD-10-CM | POA: Diagnosis not present

## 2021-08-19 DIAGNOSIS — N186 End stage renal disease: Secondary | ICD-10-CM | POA: Diagnosis not present

## 2021-08-19 DIAGNOSIS — R188 Other ascites: Secondary | ICD-10-CM | POA: Diagnosis not present

## 2021-08-19 DIAGNOSIS — Z833 Family history of diabetes mellitus: Secondary | ICD-10-CM

## 2021-08-19 DIAGNOSIS — K5730 Diverticulosis of large intestine without perforation or abscess without bleeding: Secondary | ICD-10-CM | POA: Diagnosis not present

## 2021-08-19 DIAGNOSIS — I50812 Chronic right heart failure: Secondary | ICD-10-CM | POA: Diagnosis not present

## 2021-08-19 LAB — CBC WITH DIFFERENTIAL/PLATELET
Abs Immature Granulocytes: 0.2 10*3/uL — ABNORMAL HIGH (ref 0.00–0.07)
Basophils Absolute: 0.1 10*3/uL (ref 0.0–0.1)
Basophils Relative: 1 %
Eosinophils Absolute: 0.1 10*3/uL (ref 0.0–0.5)
Eosinophils Relative: 1 %
HCT: 37 % — ABNORMAL LOW (ref 39.0–52.0)
Hemoglobin: 11.8 g/dL — ABNORMAL LOW (ref 13.0–17.0)
Immature Granulocytes: 3 %
Lymphocytes Relative: 6 %
Lymphs Abs: 0.5 10*3/uL — ABNORMAL LOW (ref 0.7–4.0)
MCH: 31.6 pg (ref 26.0–34.0)
MCHC: 31.9 g/dL (ref 30.0–36.0)
MCV: 99.2 fL (ref 80.0–100.0)
Monocytes Absolute: 0.8 10*3/uL (ref 0.1–1.0)
Monocytes Relative: 10 %
Neutro Abs: 6.1 10*3/uL (ref 1.7–7.7)
Neutrophils Relative %: 79 %
Platelets: 149 10*3/uL — ABNORMAL LOW (ref 150–400)
RBC: 3.73 MIL/uL — ABNORMAL LOW (ref 4.22–5.81)
RDW: 16.2 % — ABNORMAL HIGH (ref 11.5–15.5)
WBC: 7.7 10*3/uL (ref 4.0–10.5)
nRBC: 0 % (ref 0.0–0.2)

## 2021-08-19 LAB — COMPREHENSIVE METABOLIC PANEL
ALT: 22 U/L (ref 0–44)
AST: 27 U/L (ref 15–41)
Albumin: 3.1 g/dL — ABNORMAL LOW (ref 3.5–5.0)
Alkaline Phosphatase: 134 U/L — ABNORMAL HIGH (ref 38–126)
Anion gap: 16 — ABNORMAL HIGH (ref 5–15)
BUN: 22 mg/dL (ref 8–23)
CO2: 24 mmol/L (ref 22–32)
Calcium: 7.9 mg/dL — ABNORMAL LOW (ref 8.9–10.3)
Chloride: 93 mmol/L — ABNORMAL LOW (ref 98–111)
Creatinine, Ser: 3.04 mg/dL — ABNORMAL HIGH (ref 0.61–1.24)
GFR, Estimated: 22 mL/min — ABNORMAL LOW (ref >60)
Glucose, Bld: 123 mg/dL — ABNORMAL HIGH (ref 70–99)
Potassium: 5 mmol/L (ref 3.5–5.1)
Sodium: 133 mmol/L — ABNORMAL LOW (ref 135–145)
Total Bilirubin: 0.5 mg/dL (ref 0.3–1.2)
Total Protein: 5.2 g/dL — ABNORMAL LOW (ref 6.5–8.1)

## 2021-08-19 LAB — MAGNESIUM: Magnesium: 2 mg/dL (ref 1.7–2.4)

## 2021-08-19 LAB — PROTIME-INR
INR: 1 (ref 0.8–1.2)
Prothrombin Time: 13.5 seconds (ref 11.4–15.2)

## 2021-08-19 LAB — GLUCOSE, CAPILLARY: Glucose-Capillary: 131 mg/dL — ABNORMAL HIGH (ref 70–99)

## 2021-08-19 LAB — HEPATITIS C ANTIBODY: HCV Ab: NONREACTIVE

## 2021-08-19 LAB — CBG MONITORING, ED: Glucose-Capillary: 149 mg/dL — ABNORMAL HIGH (ref 70–99)

## 2021-08-19 LAB — T4, FREE: Free T4: 0.79 ng/dL (ref 0.61–1.12)

## 2021-08-19 LAB — TSH: TSH: 2.262 u[IU]/mL (ref 0.350–4.500)

## 2021-08-19 LAB — HEPATITIS B SURFACE ANTIGEN: Hepatitis B Surface Ag: NONREACTIVE

## 2021-08-19 LAB — BRAIN NATRIURETIC PEPTIDE: B Natriuretic Peptide: 1348.5 pg/mL — ABNORMAL HIGH (ref 0.0–100.0)

## 2021-08-19 LAB — HEPATITIS B SURFACE ANTIBODY,QUALITATIVE: Hep B S Ab: NONREACTIVE

## 2021-08-19 LAB — HEPATITIS B CORE ANTIBODY, TOTAL: Hep B Core Total Ab: NONREACTIVE

## 2021-08-19 MED ORDER — SODIUM CHLORIDE 0.9% FLUSH
3.0000 mL | Freq: Two times a day (BID) | INTRAVENOUS | Status: DC
  Administered 2021-08-19 – 2021-08-29 (×19): 3 mL via INTRAVENOUS

## 2021-08-19 MED ORDER — ACETAMINOPHEN 650 MG RE SUPP: 650.0000 mg | Freq: Four times a day (QID) | RECTAL | Status: DC | PRN

## 2021-08-19 MED ORDER — DOCUSATE SODIUM 100 MG PO CAPS
100.0000 mg | ORAL_CAPSULE | Freq: Two times a day (BID) | ORAL | Status: DC
  Administered 2021-08-19 – 2021-08-20 (×2): 100 mg via ORAL
  Filled 2021-08-19 (×11): qty 1

## 2021-08-19 MED ORDER — LIDOCAINE-EPINEPHRINE (PF) 2 %-1:200000 IJ SOLN
10.0000 mL | Freq: Once | INTRAMUSCULAR | Status: AC
  Administered 2021-08-19: 10 mL via INTRADERMAL
  Filled 2021-08-19: qty 20

## 2021-08-19 MED ORDER — OXYCODONE HCL 5 MG PO TABS: 5.0000 mg | ORAL_TABLET | ORAL | Status: DC | PRN

## 2021-08-19 MED ORDER — CHLORHEXIDINE GLUCONATE CLOTH 2 % EX PADS
6.0000 | MEDICATED_PAD | Freq: Every day | CUTANEOUS | Status: DC
Start: 2021-08-20 — End: 2021-08-29
  Administered 2021-08-20 – 2021-08-27 (×8): 6 via TOPICAL

## 2021-08-19 MED ORDER — ALLOPURINOL 300 MG PO TABS
150.0000 mg | ORAL_TABLET | Freq: Every day | ORAL | Status: DC
  Administered 2021-08-19 – 2021-08-29 (×11): 150 mg via ORAL
  Filled 2021-08-19: qty 1
  Filled 2021-08-19: qty 0.5
  Filled 2021-08-19: qty 1
  Filled 2021-08-19: qty 0.5
  Filled 2021-08-19 (×8): qty 1

## 2021-08-19 MED ORDER — ONDANSETRON HCL 4 MG/2ML IJ SOLN
4.0000 mg | Freq: Four times a day (QID) | INTRAMUSCULAR | Status: DC | PRN
  Administered 2021-08-24: 4 mg via INTRAVENOUS
  Filled 2021-08-19 (×2): qty 2

## 2021-08-19 MED ORDER — ONDANSETRON HCL 4 MG PO TABS: 4.0000 mg | ORAL_TABLET | Freq: Four times a day (QID) | ORAL | Status: DC | PRN

## 2021-08-19 MED ORDER — MIDODRINE HCL 5 MG PO TABS
10.0000 mg | ORAL_TABLET | ORAL | Status: DC
Start: 2021-08-20 — End: 2021-08-20

## 2021-08-19 MED ORDER — MYCOPHENOLATE MOFETIL 250 MG PO CAPS
1000.0000 mg | ORAL_CAPSULE | Freq: Two times a day (BID) | ORAL | Status: DC
  Administered 2021-08-19 – 2021-08-29 (×18): 1000 mg via ORAL
  Filled 2021-08-19 (×24): qty 4

## 2021-08-19 MED ORDER — INSULIN ASPART 100 UNIT/ML IJ SOLN
0.0000 [IU] | Freq: Three times a day (TID) | INTRAMUSCULAR | Status: DC
  Administered 2021-08-24: 1 [IU] via SUBCUTANEOUS
  Administered 2021-08-25: 2 [IU] via SUBCUTANEOUS
  Administered 2021-08-27: 1 [IU] via SUBCUTANEOUS

## 2021-08-19 MED ORDER — POLYETHYLENE GLYCOL 3350 17 G PO PACK: 17.0000 g | PACK | Freq: Every day | ORAL | Status: DC | PRN

## 2021-08-19 MED ORDER — HEPARIN SODIUM (PORCINE) 5000 UNIT/ML IJ SOLN
5000.0000 [IU] | Freq: Three times a day (TID) | INTRAMUSCULAR | Status: DC
  Filled 2021-08-19 (×6): qty 1

## 2021-08-19 MED ORDER — BISACODYL 5 MG PO TBEC: 5.0000 mg | DELAYED_RELEASE_TABLET | Freq: Every day | ORAL | Status: DC | PRN

## 2021-08-19 MED ORDER — HYDRALAZINE HCL 20 MG/ML IJ SOLN: 5.0000 mg | INTRAMUSCULAR | Status: DC | PRN

## 2021-08-19 MED ORDER — BUMETANIDE 2 MG PO TABS
4.0000 mg | ORAL_TABLET | ORAL | Status: DC
  Administered 2021-08-22 – 2021-08-29 (×2): 4 mg via ORAL
  Filled 2021-08-19 (×5): qty 2

## 2021-08-19 MED ORDER — ACETAMINOPHEN 325 MG PO TABS: 650.0000 mg | ORAL_TABLET | Freq: Four times a day (QID) | ORAL | Status: DC | PRN

## 2021-08-19 NOTE — Progress Notes (Signed)
  Request seen for peritoneal pleurX placement for management of ascites.  His recurrent ascites is secondary to cirrhosis.   Discussed with Dr. Dwaine Gale.  IR recommends PleurX in cirrhotics only if are going to hospice.  Cree Napoli S Adelin Ventrella PA-C 08/19/2021 12:30 PM

## 2021-08-19 NOTE — ED Triage Notes (Signed)
BIB EMS. Coming from home with c/o shortness of breath and nausea. HR was 210 initial. Denies hx of cardiac. Dialysis pt. Denies chest pain at this time. 1x'6mg'$  adenosine. 1x'12mg'$  adenosine.

## 2021-08-19 NOTE — Consult Note (Signed)
[facility] Renal Consultation Note    Indication for Consultation:  Management of ESRD/hemodialysis, anemia, hypertension/volume, and secondary hyperparathyroidism. PCP:  HPI: Gregory Tanner is a 66 y.o. male with ESRD, Hx cryptogenic cirrhosis s/p liver transplant in 2013, T2DM, and severe tricuspid regurgitation, RV dysfunction, and high-output HF who was admitted with SVT.  Presented to ED via EMS overnight with nausea and dyspnea, worse over baseline. HR was > 200 initially, SVT on monitor. He was given adenosine twice with improvement of HR to 140 range. He was afebrile. Dyspnea persisted which he attributed to worsened ascities. He has had to have a paracentesis q 3 days recently. Bedside paracentesis performed, 2.2L removed. per notes, dyspnea improved quite a bit after the paracentesis. However, when he got up to go to the bathroom, HR went back to 200 again before improving to 13-140 range, happened again when RN walked him. TSH ordered - stable. CT abd performed, no acute etiology noted - no pulm edema or effusions.  Seen in ED bed today. Breathing is "stable" - he is on room air. Doesn't recall prior issues with his HR, although he frequently has hypotension, especially with dialysis. Has been chronically overloaded, usually takes 5.5L off q HD. Wears B compression wraps on legs for anasarca. Admits that he drinks too much, says just "parched" after dialysis. He reports that has never been able to get the fluid off. Says that cardiology estimates prognosis of less than 1 year. No recent fever, chills. Frequent constipation and nausea which he attributes to his ascites. No new rash.  Dialyzes at [facility], last HD yesterday (7/5). Uses R TDC, has maturing L forearm AVF.    Past Medical History:  Diagnosis Date   Ankylosis of lumbar spine 02/21/2014   Benign essential hypertension 10/26/2015   Chronic diastolic heart failure ([place]) 09/26/2016   Chronic pain associated  with significant psychosocial dysfunction 06/17/2015   Degeneration of intervertebral disc of lumbar region 07/05/2013   ESRD (end stage renal disease) on dialysis ([place]) 03/27/2017   Hearing loss    Hepatocellular carcinoma ([place]) 12/27/2010   Overview:  Embolized 6/12    History of liver transplant ([place]) 07/20/2011   Overview:  06/07/2011 for cryptogenic cirrhosis and HCC    Uncontrolled type 2 diabetes mellitus with insulin therapy 06/21/2018   Past Surgical History:  Procedure Laterality Date   AV FISTULA PLACEMENT Left 07/16/2021   Procedure: LEFT ARM RADIOCEPHALIC ARTERIOVENOUS  FISTULA CREATION;  Surgeon: [name], MD;  Location: [place];  Service: Vascular;  Laterality: Left;   [place] STUDY  12/14/2020   Procedure: BUBBLE STUDY;  Surgeon: [name], MD;  Location: [place];  Service: Cardiovascular;;   EYE SURGERY Left    HERNIA REPAIR     IR PARACENTESIS  11/30/2020   IR PARACENTESIS  05/20/2021   IR PARACENTESIS  06/01/2021   IR PARACENTESIS  06/17/2021   IR PARACENTESIS  07/13/2021   IR PARACENTESIS  07/20/2021   IR PARACENTESIS  07/27/2021   IR PARACENTESIS  07/30/2021   IR PARACENTESIS  08/02/2021   IR PARACENTESIS  08/09/2021   LIVER SURGERY     IMPLANT   LIVER TRANSPLANT     RIGHT HEART CATH N/A 12/11/2020   Procedure: RIGHT HEART CATH;  Surgeon: [name], MD;  Location: [place] CV LAB;  Service: Cardiovascular;  Laterality: N/A;  TEE WITHOUT CARDIOVERSION N/A 12/14/2020   Procedure: TRANSESOPHAGEAL ECHOCARDIOGRAM (TEE);  Surgeon: [name], MD;  Location: [facility] ENDOSCOPY;  Service: Cardiovascular;  Laterality: N/A;   TONSILLECTOMY     Family History  Problem Relation Age of Onset   Cancer Mother    Diabetes Father    Heart attack Father    Schizophrenia Father    Hypertension Brother    Social History:  reports that he has never smoked. He has never used smokeless  tobacco. He reports current alcohol use. He reports that he does not currently use drugs.  ROS: As per HPI otherwise negative.  Physical Exam: Vitals:   08/19/21 0930 08/19/21 1000 08/19/21 1030 08/19/21 1100  BP: 107/60 102/66 104/65 104/60  Pulse: (!) 105 (!) 103 (!) 105 (!) 104  Resp: (!) 23 (!) 27 (!) 30 (!) 24  Temp:      TempSrc:      SpO2: 96% 97% 96% 100%  Weight:      Height:         General: Well appearing man, NAD. Room air Head: Normocephalic, atraumatic, sclera non-icteric, mucus membranes are moist. Neck: Supple without lymphadenopathy/masses. JVD elevated. Lungs: Unable to sit up for posterior exam, clear laterally/anteriorly. Heart: Tachycardic, no murmur appreciated Abdomen: Soft, very distended. Lower extremities: 3+ BLE edema all the way up, wearing B compression hose Neuro: Alert and oriented X 3. Moves all extremities spontaneously. Psych:  Responds to questions appropriately with a normal affect. Dialysis Access: TDC in R chest, maturing L forearm AVF  Allergies  Allergen Reactions   Iodinated Contrast Media Hives and Other (See Comments)    Allergy is NOT to all contrast - but patient is unsure of which particular one his allergy is to. Contrast dye used before liver transplant cause hives, not sure which dye this was but is able to use others   Jardiance [Empagliflozin] Dermatitis    developed blisters with the medication, blisters  stopped after he discontinued taking it. (About 6 months ago /~06/2020)   Latex Other (See Comments)    Skin peeling - adhesive    Metformin And Related Nausea And Vomiting   Other     Patient states that he is allergic to "some antibiotic" but is not sure of the name of it.   Rosiglitazone Nausea And Vomiting    GI effects and abdominal pain   Prior to Admission medications   Medication Sig Start Date End Date Taking? Authorizing Provider  acetaminophen (TYLENOL) 325 MG tablet Take 650 mg by mouth every 6 (six) hours as  needed for mild pain.   Yes [provider]  allopurinol (ZYLOPRIM) 300 MG tablet Take 150 mg by mouth daily. 05/07/18  Yes [provider]  bumetanide (BUMEX) 2 MG tablet Take 4 mg by mouth See admin instructions. Take 4 mg twice daily on non dialysis days. Tuesday, Thursday, and Sunday. 12/22/20 12/22/21 Yes [provider]  calcium carbonate (TUMS - DOSED IN MG ELEMENTAL CALCIUM) 500 MG chewable tablet Chew 1 tablet by mouth daily as needed for indigestion or heartburn.   Yes [provider]  insulin lispro (HUMALOG) 100 UNIT/ML KwikPen Inject 2-24 Units into the skin See admin instructions. Sliding scale 150-200= 2 units 201-250=3 units 251-300=4 units 301-350=5 units 8 units for small meals 24 units for High carbohydrates 09/19/20  Yes [provider]  midodrine (PROAMATINE) 10 MG tablet Take 10 mg by mouth See admin instructions. Take '10mg'$  before dialysis on Monday, Wednesday,  Friday, and  some Saturday depend on food retention 04/21/21  Yes [provider]  Multiple Vitamin (MULTIVITAMIN) tablet Take 1 tablet by mouth daily.   Yes [provider]  mycophenolate (CELLCEPT) 250 MG capsule Take 4 capsules (1,000 mg total) by mouth in the morning and at bedtime. 12/03/20  Yes [name], MD  TART CHERRY PO Take 1 tablet by mouth in the morning and at bedtime.   Yes [provider]   Current Facility-Administered Medications  Medication Dose Route Frequency Provider Last Rate Last Admin   acetaminophen (TYLENOL) tablet 650 mg  650 mg Oral Q6H PRN Karmen Bongo, MD       Or   acetaminophen (TYLENOL) suppository 650 mg  650 mg Rectal Q6H PRN Karmen Bongo, MD       allopurinol (ZYLOPRIM) tablet 150 mg  150 mg Oral Daily Karmen Bongo, MD   150 mg at 08/19/21 1056   bisacodyl (DULCOLAX) EC tablet 5 mg  5 mg Oral Daily PRN Karmen Bongo, MD       Derrill Memo ON 08/22/2021] bumetanide (BUMEX) tablet 4 mg  4 mg Oral Once  per day on Sun Tue Thu Yates, Jennifer, MD       docusate sodium (COLACE) capsule 100 mg  100 mg Oral BID Karmen Bongo, MD   100 mg at 08/19/21 1056   heparin injection 5,000 Units  5,000 Units Subcutaneous Lynne Logan, MD       hydrALAZINE (APRESOLINE) injection 5 mg  5 mg Intravenous Q4H PRN Karmen Bongo, MD       insulin aspart (novoLOG) injection 0-6 Units  0-6 Units Subcutaneous TID WC Karmen Bongo, MD       [START ON 08/20/2021] midodrine (PROAMATINE) tablet 10 mg  10 mg Oral Q M,W,F,Sa-1800 Karmen Bongo, MD       mycophenolate (CELLCEPT) capsule 1,000 mg  1,000 mg Oral BID Karmen Bongo, MD   1,000 mg at 08/19/21 1056   ondansetron (ZOFRAN) tablet 4 mg  4 mg Oral Q6H PRN Karmen Bongo, MD       Or   ondansetron [facility]) injection 4 mg  4 mg Intravenous Q6H PRN Karmen Bongo, MD       oxyCODONE (Oxy IR/ROXICODONE) immediate release tablet 5 mg  5 mg Oral Q4H PRN Karmen Bongo, MD       polyethylene glycol (MIRALAX / GLYCOLAX) packet 17 g  17 g Oral Daily PRN Karmen Bongo, MD       sodium chloride flush (NS) 0.9 % injection 3 mL  3 mL Intravenous Lillia Mountain, MD   3 mL at 08/19/21 1057   Current Outpatient Medications  Medication Sig Dispense Refill   acetaminophen (TYLENOL) 325 MG tablet Take 650 mg by mouth every 6 (six) hours as needed for mild pain.     allopurinol (ZYLOPRIM) 300 MG tablet Take 150 mg by mouth daily.     bumetanide (BUMEX) 2 MG tablet Take 4 mg by mouth See admin instructions. Take 4 mg twice daily on non dialysis days. Tuesday, Thursday, and Sunday.     calcium carbonate (TUMS - DOSED IN MG ELEMENTAL CALCIUM) 500 MG chewable tablet Chew 1 tablet by mouth daily as needed for indigestion or heartburn.     insulin lispro (HUMALOG) 100 UNIT/ML KwikPen Inject 2-24 Units into the skin See admin instructions. Sliding scale 150-200= 2 units 201-250=3 units 251-300=4 units 301-350=5 units 8 units for small meals 24 units for High  carbohydrates  midodrine (PROAMATINE) 10 MG tablet Take 10 mg by mouth See admin instructions. Take '10mg'$  before dialysis on Monday, Wednesday, Friday, and  some Saturday depend on food retention     Multiple Vitamin (MULTIVITAMIN) tablet Take 1 tablet by mouth daily.     mycophenolate (CELLCEPT) 250 MG capsule Take 4 capsules (1,000 mg total) by mouth in the morning and at bedtime. 240 capsule 0   TART CHERRY PO Take 1 tablet by mouth in the morning and at bedtime.     Labs: Basic Metabolic Panel: Recent Labs  Lab 08/19/21 0226  NA 133*  K 5.0  CL 93*  CO2 24  GLUCOSE 123*  BUN 22  CREATININE 3.04*  CALCIUM 7.9*   Liver Function Tests: Recent Labs  Lab 08/19/21 0226  AST 27  ALT 22  ALKPHOS 134*  BILITOT 0.5  PROT 5.2*  ALBUMIN 3.1*   CBC: Recent Labs  Lab 08/19/21 0226  WBC 7.7  NEUTROABS 6.1  HGB 11.8*  HCT 37.0*  MCV 99.2  PLT 149*   Studies/Results: CT CHEST ABDOMEN PELVIS WO CONTRAST  Result Date: 08/19/2021 CLINICAL DATA:  Tachycardia. EXAM: CT CHEST, ABDOMEN AND PELVIS WITHOUT CONTRAST TECHNIQUE: Multidetector CT imaging of the chest, abdomen and pelvis was performed following the standard protocol without IV contrast. RADIATION DOSE REDUCTION: This exam was performed according to the departmental dose-optimization program which includes automated exposure control, adjustment of the mA and/or kV according to patient size and/or use of iterative reconstruction technique. COMPARISON:  Chest CT 12/05/2020.  Abdominal CT 09/18/2007 FINDINGS: CT CHEST FINDINGS Cardiovascular: Enlarged heart. No pericardial effusion. No acute vascular finding. Prominent size of peripheral pulmonary arteries implying pulmonary hypertension although the main pulmonary artery does not appear particularly enlarged. Dialysis catheter with tip at the upper right atrium. Mediastinum/Nodes: Negative for adenopathy or mass. Lungs/Pleura: There is no edema, consolidation, effusion, or  pneumothorax. Musculoskeletal: Bridging thoracic osteophytes CT ABDOMEN PELVIS FINDINGS Hepatobiliary: Lobulation of the transplanted liver surface. No acute or focal finding.No evidence of biliary obstruction or stone. Pancreas: Unremarkable. Spleen: Chronic splenic enlargement, likely from portal hypertension. Adrenals/Urinary Tract: Negative adrenals. No hydronephrosis or stone. Isodense 19 mm nodule from the upper pole right kidney. 2.4 cm isodense nodule from the lower pole left kidney. Simple and proteinaceous cysts were noted on abdominal MRI from earlier this year noted in care everywhere. Unremarkable bladder. Stomach/Bowel: Colonic diverticulosis. No evidence of bowel obstruction or visible inflammation Vascular/Lymphatic: Atheromatous calcifications. Dilated IVC and pelvic veins. Aneurysmal enlargement of the left common iliac artery to 3.5 cm diameter, unchanged from abdominal MRI report 04/03/2021 at [facility]. There is indistinct soft tissue density along the left pelvic sidewall measuring up to 4.4 cm, it is unclear if this is adenopathy or vessel. The density and discrete appearance is not suggestive of hematoma. No generalized adenopathy. Reproductive:No pathologic findings. Other: Small volume ascites. Musculoskeletal: No acute finding.   L2-S1 lumbar fusion. IMPRESSION: 1. Indeterminate soft tissue density along the left pelvic sidewall which could be adenopathy or abnormal vessel. Postcontrast imaging is recommended when clinically possible. 2. No acute finding in the chest.  Chronic cardiomegaly. 3. Transplanted liver. Electronically Signed   By: Jorje Guild M.D.   On: 08/19/2021 06:02   DG Chest Port 1 View  Result Date: 08/19/2021 CLINICAL DATA:  Shortness of breath. EXAM: PORTABLE CHEST 1 VIEW COMPARISON:  07/16/2021 FINDINGS: The heart is enlarged the mediastinal contour is stable. The pulmonary vasculature is distended. Mild airspace disease is present bilaterally. A stable  right internal  jugular central venous catheter is noted. No effusion or pneumothorax. No acute osseous abnormality. IMPRESSION: Cardiomegaly with pulmonary vascular congestion. Mild airspace disease is noted bilaterally, possible edema or infiltrate. Electronically Signed   By: Brett Fairy M.D.   On: 08/19/2021 02:59    Dialysis Orders:  MWF at [place] 4hr, 400/800, EDW 84.5 (doesn't typically reach, post wt 7/5 was 88.8kg), TDC, heparin 4000 unit bolus + 2500 unit mid-run bolus - Calcitriol 0.47mg PO q HD - no ESA  Assessment/Plan: SVT: S/p adenosine x 2 on admit, HR now 100-140 range, although shoots up with movement. TSH normal range. Cardiology consult pending. Known high-output HF/chronic volume overload: Per notes, considering IR to place PleurX for maligant ascites. Pt reports TR and R heart failure as driving factor for overload. Hx liver transplant 2013: Functioning per patient, continue IS regimen. ESRD:  Continue HD per usual MWF schedule - HD tomorrow. Hypotension/volume: Chronic hypotension, uses midodrine pre-HD, may need to consider daily use. Chronic anasarca. Tells me pulls 5-5.5L off q HD, drinks WAY too much fluid - discussed.  Anemia: Hgb 11.8, no ESA needed.  Metabolic bone disease: Ca ok, Phos pending.  Nutrition:  Alb slightly low.   KVeneta Penton PA-C 08/19/2021, 11:39 AM  [employer]

## 2021-08-19 NOTE — Consult Note (Addendum)
Cardiology Consultation:   Patient ID: [name] MRN: [id number]; DOB: Aug 13, 1955  Admit date: 08/19/2021 Date of Consult: 08/19/2021  PCP:  Ronita Hipps, MD   [facility] Providers Cardiologist:  Shirlee More, MD      Patient Profile:   Gregory Tanner is a 66 y.o. male with a hx of HTN, chronic diastolic CHF, chronic pain, ESRD on MWF HD, DM, and NASH cirrhosis with  hepatocellular carcinoma s/p liver transplant who is being seen 08/19/2021 for the evaluation of SVT at the request of Dr. Lorin Mercy.  History of Present Illness:   Gregory Tanner is a 66 year old male with above medical history who historically has been followed by Dr. Bettina Gavia.  Most recently, patient was seen by advanced heart failure team.  Per chart review, patient has a history of cryptogenic cirrhosis that was complicated by hepatocellular carcinoma.  Underwent liver transplantation in 2013.  In 06/2019, patient was having trouble with edema in the setting of diastolic heart failure, CKD, multifactorial liver disease.  He underwent echocardiogram on 07/03/2019 that showed EF 61-95%, normal diastolic parameters.  Later went on to have a right heart catheterization in 01/2020 that showed elevated filling pressures, RV>LV with very high cardiac output.  Was seen by our advanced heart failure team in 11/2020 while he admitted to the hospital for evaluation of shortness of breath.  Echocardiogram on 11/23/2020 showed EF 65-70%, mild LVH, grade 2 diastolic dysfunction, intraventricular septum flattening in systole consistent with RV pressure overload, severely elevated pulmonary artery systolic pressure, severely dilated LA and RA.  Right heart catheterization on 12/11/2020 showed mildly elevated PCWP, primarily RV failure.  Underwent TEE on 12/14/2020 that showed LVEF 60-65%, D-shaped septum suggestive of RV pressure/volume overload, mildly reduced RV systolic function, moderate enlargement of LV, moderate dilation of LA and  RA, moderate tricuspid valve regurgitation.  Cardiac MRI on 12/15/2020 showed that there was no anomalous pulmonary venous return.  Patient was last seen by our cardiology group on 12/16/2020.  Has since been followed by his cardiologist at [facility] for treatment of high output heart failure, right-sided heart failure.   Patient started hemodialysis on 04/06/2021. Patient also had a mechanic fall and developed a [place] in 03/2021. Patient has recently been getting paracentesis about every 3 days.   Patient presented to the ED at [facility] on 7/6 complaining of shortness of breath, nausea. HR was initially 210. Was given one dose of adenosine 6 mg followed by a second dose of 12 mg. HR improved to the 140s. Patient reported that he often felt like that when he had fluid on his abdomen. Underwent bedside paracentesis that yielded 2200 mL fluid.   Labs in the ED showed Na 133, K 5.0, mag 2.0, creatinine 3.04, albumin 3.1, WBC 7.7, hemoglobin 11.8, platelets 149. BNP elevated to 1348.5.   CXR showed cardiomegaly with pulmonary vascular congestion, mild airspace disease is noted bilaterally, possible edema or infiltrate.   EKG showed sinus tachycardia with bigeminy, HR 148 BPM.   CT chest abdomen pelvis showed an enlarged heart, no pericardial effusion, no acute vascular findings, prominent size of peripheral pulmonary arteries implying pulmonary HTN. Also showed an indeterminate soft tissue density along the left pelvic sidewall which could be adenopathy or abnormal vessel.   On interview, patient reports that he has been followed by [facility] for his cardiac issues.  Reports that about 10 years ago, while he had liver failure, he was noted to be in high-output heart failure.  After  he had his liver transplant, patient continued to have heart failure.  Reports undergoing multiple echocardiograms, heart catheterizations, cardiac MRIs.  Recently, patient was given 1 year to live by [facility].  Patient reports that he  holds onto fluid very easily.  Has a paracentesis done every 3 days.  Goes to dialysis 3-4 days a week.  Often his dialysis is complicated by hypotension and they are not able to get as much fluid off as they would like.  Yesterday evening, patient felt very nauseous and felt like he was unable to breathe.  He has had similar shortness of breath when needing paracentesis in the past, however often not this severe.  Patient denies having any chest pain but did feel like he was fighting to breathe.  When he got to the ER he underwent paracentesis.  Since then his breathing has significantly improved.  Currently patient is mildly short of breath, denies chest pain, denies nausea.  Has been able to have 4 bowel movements since paracentesis.  Past Medical History:  Diagnosis Date   Ankylosis of lumbar spine 02/21/2014   Benign essential hypertension 10/26/2015   Chronic diastolic heart failure ([place]) 09/26/2016   Chronic pain associated with significant psychosocial dysfunction 06/17/2015   Degeneration of intervertebral disc of lumbar region 07/05/2013   ESRD (end stage renal disease) on dialysis ([place]) 03/27/2017   Hearing loss    Hepatocellular carcinoma ([place]) 12/27/2010   Overview:  Embolized 6/12    History of liver transplant ([place]) 07/20/2011   Overview:  06/07/2011 for cryptogenic cirrhosis and HCC    Uncontrolled type 2 diabetes mellitus with insulin therapy 06/21/2018    Past Surgical History:  Procedure Laterality Date   AV FISTULA PLACEMENT Left 07/16/2021   Procedure: LEFT ARM RADIOCEPHALIC ARTERIOVENOUS  FISTULA CREATION;  Surgeon: [name], MD;  Location: [place];  Service: Vascular;  Laterality: Left;   [place] STUDY  12/14/2020   Procedure: BUBBLE STUDY;  Surgeon: [name], MD;  Location: [place];  Service: Cardiovascular;;   EYE SURGERY Left    HERNIA REPAIR     IR PARACENTESIS  11/30/2020   IR  PARACENTESIS  05/20/2021   IR PARACENTESIS  06/01/2021   IR PARACENTESIS  06/17/2021   IR PARACENTESIS  07/13/2021   IR PARACENTESIS  07/20/2021   IR PARACENTESIS  07/27/2021   IR PARACENTESIS  07/30/2021   IR PARACENTESIS  08/02/2021   IR PARACENTESIS  08/09/2021   LIVER SURGERY     IMPLANT   LIVER TRANSPLANT     RIGHT HEART CATH N/A 12/11/2020   Procedure: RIGHT HEART CATH;  Surgeon: [name], MD;  Location: [place] CV LAB;  Service: Cardiovascular;  Laterality: N/A;   TEE WITHOUT CARDIOVERSION N/A 12/14/2020   Procedure: TRANSESOPHAGEAL ECHOCARDIOGRAM (TEE);  Surgeon: [name], MD;  Location: [facility] ENDOSCOPY;  Service: Cardiovascular;  Laterality: N/A;   TONSILLECTOMY       Home Medications:  Prior to Admission medications   Medication Sig Start Date End Date Taking? Authorizing Provider  acetaminophen (TYLENOL) 325 MG tablet Take 650 mg by mouth every 6 (six) hours as needed for mild pain.   Yes [provider]  allopurinol (ZYLOPRIM) 300 MG tablet Take 150 mg by mouth daily. 05/07/18  Yes [provider]  bumetanide (BUMEX) 2 MG tablet Take 4 mg by mouth See admin instructions. Take 4 mg  twice daily on non dialysis days. Tuesday, Thursday, and Sunday. 12/22/20 12/22/21 Yes [provider]  calcium carbonate (TUMS - DOSED IN MG ELEMENTAL CALCIUM) 500 MG chewable tablet Chew 1 tablet by mouth daily as needed for indigestion or heartburn.   Yes [provider]  insulin lispro (HUMALOG) 100 UNIT/ML KwikPen Inject 2-24 Units into the skin See admin instructions. Sliding scale 150-200= 2 units 201-250=3 units 251-300=4 units 301-350=5 units 8 units for small meals 24 units for High carbohydrates 09/19/20  Yes [provider]  midodrine (PROAMATINE) 10 MG tablet Take 10 mg by mouth See admin instructions. Take '10mg'$  before dialysis on Monday, Wednesday, Friday, and  some Saturday depend on food retention 04/21/21  Yes [provider]  Multiple Vitamin (MULTIVITAMIN) tablet Take 1 tablet by mouth daily.   Yes [provider]  mycophenolate (CELLCEPT) 250 MG capsule Take 4 capsules (1,000 mg total) by mouth in the morning and at bedtime. 12/03/20  Yes [name], MD  TART CHERRY PO Take 1 tablet by mouth in the morning and at bedtime.   Yes [provider]    Inpatient Medications: Scheduled Meds:  allopurinol  150 mg Oral Daily   [START ON 08/22/2021] bumetanide  4 mg Oral Once per day on Sun Tue Thu   docusate sodium  100 mg Oral BID   heparin  5,000 Units Subcutaneous Q8H   insulin aspart  0-6 Units Subcutaneous TID WC   [START ON 08/20/2021] midodrine  10 mg Oral Q M,W,F,Sa-1800   mycophenolate  1,000 mg Oral BID   sodium chloride flush  3 mL Intravenous Q12H   Continuous Infusions:  PRN Meds: acetaminophen **OR** acetaminophen, bisacodyl, hydrALAZINE, ondansetron **OR** ondansetron (ZOFRAN) IV, oxyCODONE, polyethylene glycol  Allergies:    Allergies  Allergen Reactions   Iodinated Contrast Media Hives and Other (See Comments)    Allergy is NOT to all contrast - but patient is unsure of which particular one his allergy is to. Contrast dye used before liver transplant cause hives, not sure which dye this was but is able to use others   Jardiance [Empagliflozin] Dermatitis    developed blisters with the medication, blisters  stopped after he discontinued taking it. (About 6 months ago /~06/2020)   Latex Other (See Comments)    Skin peeling - adhesive    Metformin And Related Nausea And Vomiting   Other     Patient states that he is allergic to "some antibiotic" but is not sure of the name of it.   Rosiglitazone Nausea And Vomiting    GI effects and abdominal pain    Social History:   Social History   Socioeconomic History   Marital status: Married    Spouse name: Not on file   Number of children: Not on file   Years of education: Not on file   Highest education  level: Not on file  Occupational History   Not on file  Tobacco Use   Smoking status: Never   Smokeless tobacco: Never  Vaping Use   Vaping Use: Never used  Substance and Sexual Activity   Alcohol use: Yes    Alcohol/week: 0.0 standard drinks of alcohol    Comment: ocassional   Drug use: Not Currently   Sexual activity: Not on file  Other Topics Concern   Not on file  Social History Narrative   Not on file   Social Determinants of Health   Financial Resource Strain: Not on file  Food Insecurity: Not on file  Transportation Needs: Not on file  Physical Activity: Not on file  Stress: Not on file  Social Connections: Not on file  Intimate Partner Violence: Not on file    Family History:    Family History  Problem Relation Age of Onset   Cancer Mother    Diabetes Father    Heart attack Father    Schizophrenia Father    Hypertension Brother      ROS:  Please see the history of present illness.   All other ROS reviewed and negative.     Physical Exam/Data:   Vitals:   08/19/21 0930 08/19/21 1000 08/19/21 1030 08/19/21 1100  BP: 107/60 102/66 104/65 104/60  Pulse: (!) 105 (!) 103 (!) 105 (!) 104  Resp: (!) 23 (!) 27 (!) 30 (!) 24  Temp:      TempSrc:      SpO2: 96% 97% 96% 100%  Weight:      Height:       No intake or output data in the 24 hours ending 08/19/21 1119    08/19/2021    2:23 AM 07/16/2021    7:20 AM 07/14/2021    5:30 PM  Last 3 Weights  Weight (lbs) 197 lb 1.5 oz 197 lb 194 lb 0.1 oz  Weight (kg) 89.4 kg 89.359 kg 88 kg     Body mass index is 28.28 kg/m.  General:  Chronically ill appearing male in no acute distress  HEENT: normal Neck: + JVD  Vascular: Radial pulses 2+ bilaterally. AV fistula on left arm  Cardiac:  normal S1, S2; regular rhythm, tachycardic. S3 gallop present. Grade 1/6 systolic murmur at left sternal boarder  Lungs:  unable to sit up for posterior exam. Anterior exam clear to ausculation  Abd: soft, very distended  Ext:  3+ pitting edema to mid thigh. Wearing compression socks  Skin: warm and dry  Neuro:  CNs 2-12 intact, no focal abnormalities noted Psych:  Normal affect   EKG:  The EKG was personally reviewed and demonstrates:   Telemetry:  Telemetry was personally reviewed and demonstrates:  Sinus tachycardia, HR in the 100s   Relevant CV Studies:   Laboratory Data:  High Sensitivity Troponin:  No results for input(s): "TROPONINIHS" in the last 720 hours.   Chemistry Recent Labs  Lab 08/19/21 0226  NA 133*  K 5.0  CL 93*  CO2 24  GLUCOSE 123*  BUN 22  CREATININE 3.04*  CALCIUM 7.9*  MG 2.0  GFRNONAA 22*  ANIONGAP 16*    Recent Labs  Lab 08/19/21 0226  PROT 5.2*  ALBUMIN 3.1*  AST 27  ALT 22  ALKPHOS 134*  BILITOT 0.5   Lipids No results for input(s): "CHOL", "TRIG", "HDL", "LABVLDL", "LDLCALC", "CHOLHDL" in the last 168 hours.  Hematology Recent Labs  Lab 08/19/21 0226  WBC 7.7  RBC 3.73*  HGB 11.8*  HCT 37.0*  MCV 99.2  MCH 31.6  MCHC 31.9  RDW 16.2*  PLT 149*   Thyroid  Recent Labs  Lab 08/19/21 0611  TSH 2.262  FREET4 0.79    BNP Recent Labs  Lab 08/19/21 0226  BNP 1,348.5*    DDimer No results for input(s): "DDIMER" in the last 168 hours.   Radiology/Studies:  CT CHEST ABDOMEN PELVIS WO CONTRAST  Result Date: 08/19/2021 CLINICAL DATA:  Tachycardia. EXAM: CT CHEST, ABDOMEN AND PELVIS WITHOUT CONTRAST TECHNIQUE: Multidetector CT imaging of the chest, abdomen and pelvis was performed following the  standard protocol without IV contrast. RADIATION DOSE REDUCTION: This exam was performed according to the departmental dose-optimization program which includes automated exposure control, adjustment of the mA and/or kV according to patient size and/or use of iterative reconstruction technique. COMPARISON:  Chest CT 12/05/2020.  Abdominal CT 09/18/2007 FINDINGS: CT CHEST FINDINGS Cardiovascular: Enlarged heart. No pericardial effusion. No acute vascular finding.  Prominent size of peripheral pulmonary arteries implying pulmonary hypertension although the main pulmonary artery does not appear particularly enlarged. Dialysis catheter with tip at the upper right atrium. Mediastinum/Nodes: Negative for adenopathy or mass. Lungs/Pleura: There is no edema, consolidation, effusion, or pneumothorax. Musculoskeletal: Bridging thoracic osteophytes CT ABDOMEN PELVIS FINDINGS Hepatobiliary: Lobulation of the transplanted liver surface. No acute or focal finding.No evidence of biliary obstruction or stone. Pancreas: Unremarkable. Spleen: Chronic splenic enlargement, likely from portal hypertension. Adrenals/Urinary Tract: Negative adrenals. No hydronephrosis or stone. Isodense 19 mm nodule from the upper pole right kidney. 2.4 cm isodense nodule from the lower pole left kidney. Simple and proteinaceous cysts were noted on abdominal MRI from earlier this year noted in care everywhere. Unremarkable bladder. Stomach/Bowel: Colonic diverticulosis. No evidence of bowel obstruction or visible inflammation Vascular/Lymphatic: Atheromatous calcifications. Dilated IVC and pelvic veins. Aneurysmal enlargement of the left common iliac artery to 3.5 cm diameter, unchanged from abdominal MRI report 04/03/2021 [facility]. There is indistinct soft tissue density along the left pelvic sidewall measuring up to 4.4 cm, it is unclear if this is adenopathy or vessel. The density and discrete appearance is not suggestive of hematoma. No generalized adenopathy. Reproductive:No pathologic findings. Other: Small volume ascites. Musculoskeletal: No acute finding.   L2-S1 lumbar fusion. IMPRESSION: 1. Indeterminate soft tissue density along the left pelvic sidewall which could be adenopathy or abnormal vessel. Postcontrast imaging is recommended when clinically possible. 2. No acute finding in the chest.  Chronic cardiomegaly. 3. Transplanted liver. Electronically Signed   By: Jorje Guild M.D.   On: 08/19/2021  06:02   DG Chest Port 1 View  Result Date: 08/19/2021 CLINICAL DATA:  Shortness of breath. EXAM: PORTABLE CHEST 1 VIEW COMPARISON:  07/16/2021 FINDINGS: The heart is enlarged the mediastinal contour is stable. The pulmonary vasculature is distended. Mild airspace disease is present bilaterally. A stable right internal jugular central venous catheter is noted. No effusion or pneumothorax. No acute osseous abnormality. IMPRESSION: Cardiomegaly with pulmonary vascular congestion. Mild airspace disease is noted bilaterally, possible edema or infiltrate. Electronically Signed   By: Brett Fairy M.D.   On: 08/19/2021 02:59     Assessment and Plan:   SVT  - Patient presented with HR 210 and in SVT with EMS  - Given adenosine in the ER with some improvement in HR to the 140s  - Reports having SOB, no chest pain or palpitations.  - Underwent paracentesis in the ED with improvement in HR and symptoms. Now only mild SOB and HR is consistently around 105 BPM. He was able to walk to the toilet without his HR becoming elevated  - Of note, patient reports that when he is in the hospital for volume overload, his HR often elevates to the 150s-190s  - Suspect elevated HR is due to volume overload. Has improved with paracentesis. Follow with diuresis  - BP soft-- unable to start BB at this time  - If patient has additional bouts of SVT, can consider amiodarone at that time  - Maintain K>4, mag >2.   Volume Overload in an ESRD on HD patient Acute on chronic diastolic CHF  Severe Pulmonary HTN  High Output Heart Failure  - Patient has multisystem organ dysfunction  - Has recurrent ascites-- had a paracentesis in the ED with improvement in SOB  - Patient typically followed by [place]. Was recently given 1 year prognosis  - Most recent echocardiogram in 10/22 showed EF 65-70%, mild LVH, grade II diastolic dysfunction  - Patient is very volume overloaded on exam. Reports that he does not follow his fluid or dietary  restrictions because of his poor prognosis. Reports that he wants to enjoy what time he has left  - Volume to be managed by HD -- nephrology consulted. Does have HD 4x a week, however his BP is often too low for him to have all the fluid drawn off  - Hold home BP medications to allow better dialysis  - Patient produces very little urine, does take bumex 3 times a week   Otherwise per primary  - Diabetes  - H/o liver transplant  - Goals of care-- Palliative care consulted. May be willing to consider hospice for assistance with home O2 but otherwise anticipates improvement with full scope of care, including full code    Risk Assessment/Risk Scores:    New York Heart Association (NYHA) Functional Class NYHA Class IV        For questions or updates, please contact [facility] Please consult [email or web address] for contact info under    Signed, Margie Billet, PA-C  08/19/2021 11:19 AM   Personally seen and examined. Agree with APP above with the following comments:  Briefly 66 yo M with a history of HTN, High out put HF, ESRD on HD Hepatocelluar carcinoma s/p transplant and now with end stage disease who presents with SOB  Patient notes that he is doing worse.  He is now swelling up every day.  Doesn't see a dry weight in his future.  Would like an indwelling catheter (like a PleurX cath but for his ascites) for quality of life.  Knows that he has < 6 months left and wants to spend that time with family. Makes minimal urine on high dose bumex. No CP, no SOB no syncope.  Notes that he has always had palpitations that come and go. With EMS has rates of 240 that slowed by did not abort with adenosine.  Exam notable for regular tachycardia, he has an LV heave, dullness to percussion with positive fluid wave.  Rest as above EKG AT rate 149 with [facility] Tele: sinus tachycardia  Would recommend  - I suspect that he probably has intermittent SVT, and that is worsened during volume  overload - continue telemetry - He will not tolerate start of AV nodal agents because of hypotension leading to decreased HD time - if this is persistent, I have limited options for AAD given his high output HF, and liver disease and ESRD - if needed, I am willing to try PO Amiodarone as high risk and for symptom management only - hospice appropriate; I think he is low risk for indwelling catheter placement if feasible - reviewed care with patient and wife  Rudean Haskell, MD [id number] [place], [place] [id number] [phone]  3:33 PM

## 2021-08-19 NOTE — ED Notes (Addendum)
Pt alert, NAD, calm, interactive, resps e/u. Speaking in long phrases. Skin W&D. Family at [facility]. Admitting MD into room, at [facility]. L arm restricted. R chest HD cath in place. Returned to monitor.

## 2021-08-19 NOTE — ED Notes (Signed)
Procedure completed at this time. 2200 ml fluid obtained from paracentesis. Patient insertion site closed with dermabond by Dr. Tyrone Nine.

## 2021-08-19 NOTE — ED Provider Notes (Signed)
[facility] EMERGENCY DEPARTMENT Provider Note   CSN: [id number] Arrival date & time: 08/19/21  0217     History  Chief Complaint  Patient presents with   Shortness of Breath   Chest Pain    Gregory Tanner is a 66 y.o. male.  66 yo M with a chief complaints of shortness of breath.  The patient got up to go to the bathroom and suddenly felt much worse.  Been feeling short of breath for the past day or so.  Had dialysis this morning without issue.  EMS was called and he was found to be in SVT with a rate of 200.  He was given adenosine with improvement.  He continues to feel significantly short of breath now.  Heart rates now more in the 140s.  He denies cough congestion or fever.  He feels like he gets this way when he has too much fluid buildup in his abdominal cavity.  Has been getting paracentesis about every 3 days and is scheduled to have it done tomorrow.   Shortness of Breath Associated symptoms: chest pain   Chest Pain Associated symptoms: shortness of breath        Home Medications Prior to Admission medications   Medication Sig Start Date End Date Taking? Authorizing Provider  allopurinol (ZYLOPRIM) 300 MG tablet Take 150 mg by mouth daily. 05/07/18   [provider]  bumetanide (BUMEX) 2 MG tablet Take 4 mg by mouth See admin instructions. Take 4 mg twice daily on non dialysis days. Tuesday, Thursday, and Sunday. 12/22/20 12/22/21  [provider]  dextromethorphan-guaiFENesin (MUCINEX DM) 30-600 MG 12hr tablet Take 1 tablet by mouth 2 (two) times daily as needed for cough. Patient not taking: Reported on 07/14/2021 12/03/20   [name], MD  insulin lispro (HUMALOG) 100 UNIT/ML KwikPen Inject 2-24 Units into the skin See admin instructions. Sliding scale 150-200= 2 units 201-250=3 units 251-300=4 units 301-350=5 units 8 units for small meals 24 units for High carbohydrates 09/19/20   [provider]  midodrine  (PROAMATINE) 10 MG tablet Take 10 mg by mouth See admin instructions. Take '10mg'$  before dialysis on Monday, Wednesday, Friday, and  some Saturday depend on food retention 04/21/21   [provider]  Multiple Vitamin (MULTIVITAMIN) tablet Take 1 tablet by mouth daily.    [provider]  mupirocin ointment (BACTROBAN) 2 % Apply 1 application. topically daily as needed (apply to posterior). 07/08/21   [provider]  mycophenolate (CELLCEPT) 250 MG capsule Take 4 capsules (1,000 mg total) by mouth in the morning and at bedtime. 12/03/20   [name], MD  nystatin cream (MYCOSTATIN) Apply 1 application. topically daily as needed (apply to posterior before dialysis).    [provider]  oxyCODONE-acetaminophen (PERCOCET) 5-325 MG tablet Take 1 tablet by mouth every 4 (four) hours as needed for severe pain. 07/16/21 07/16/22  Schuh, McKenzi P, PA-C  Probiotic Product (PROBIOTIC PO) Take 1 tablet by mouth daily.    [provider]  sevelamer carbonate (RENVELA) 800 MG tablet Take 800 mg by mouth 3 (three) times daily with meals.    [provider]  TART CHERRY PO Take 1 tablet by mouth in the morning and at bedtime.    [provider]  triamcinolone ointment (KENALOG) 0.1 % Apply 1 application. topically 2 (two) times daily. Patient not taking: Reported on 07/14/2021 03/01/21   [provider]  trolamine salicylate (ASPERCREME) 10 % cream Apply  1 application. topically daily as needed (pain in posterior).    [provider]      Allergies    Iodinated contrast media, Jardiance [empagliflozin], Latex, Metformin and related, and Rosiglitazone    Review of Systems   Review of Systems  Respiratory:  Positive for shortness of breath.   Cardiovascular:  Positive for chest pain.    Physical Exam Updated Vital Signs BP (!) 95/59   Pulse (!) 104   Temp 98.4 F (36.9 C) (Oral)   Resp (!) 32   Ht '5\' 10"'$  (1.778 m)   Wt 89.4  kg   SpO2 99%   BMI 28.28 kg/m  Physical Exam Vitals and nursing note reviewed.  Constitutional:      Appearance: He is well-developed.  HENT:     Head: Normocephalic and atraumatic.  Eyes:     Pupils: Pupils are equal, round, and reactive to light.  Neck:     Vascular: No JVD.  Cardiovascular:     Rate and Rhythm: Normal rate and regular rhythm.     Heart sounds: No murmur heard.    No friction rub. No gallop.  Pulmonary:     Effort: No respiratory distress.     Breath sounds: No wheezing.  Abdominal:     General: There is no distension.     Tenderness: There is no abdominal tenderness. There is no guarding or rebound.     Comments: Distended abdomen with positive fluid wave  Musculoskeletal:        General: Normal range of motion.     Cervical back: Normal range of motion and neck supple.  Skin:    Coloration: Skin is not pale.     Findings: No rash.  Neurological:     Mental Status: He is alert and oriented to person, place, and time.  Psychiatric:        Behavior: Behavior normal.     ED Results / Procedures / Treatments   Labs (all labs ordered are listed, but only abnormal results are displayed) Labs Reviewed  CBC WITH DIFFERENTIAL/PLATELET - Abnormal; Notable for the following components:      Result Value   RBC 3.73 (*)    Hemoglobin 11.8 (*)    HCT 37.0 (*)    RDW 16.2 (*)    Platelets 149 (*)    Lymphs Abs 0.5 (*)    Abs Immature Granulocytes 0.20 (*)    All other components within normal limits  COMPREHENSIVE METABOLIC PANEL - Abnormal; Notable for the following components:   Sodium 133 (*)    Chloride 93 (*)    Glucose, Bld 123 (*)    Creatinine, Ser 3.04 (*)    Calcium 7.9 (*)    Total Protein 5.2 (*)    Albumin 3.1 (*)    Alkaline Phosphatase 134 (*)    GFR, Estimated 22 (*)    Anion gap 16 (*)    All other components within normal limits  BRAIN NATRIURETIC PEPTIDE - Abnormal; Notable for the following components:   B Natriuretic Peptide  1,348.5 (*)    All other components within normal limits  PROTIME-INR  MAGNESIUM  TSH  T4, FREE    EKG EKG Interpretation  Date/Time:  Thursday August 19 2021 05:11:21 EDT Ventricular Rate:  149 PR Interval:  82 QRS Duration: 113 QT Interval:  316 QTC Calculation: 437 R Axis:   39 Text Interpretation: Ectopic atrial tachycardia, unifocal Supraventricular bigeminy Incomplete right bundle branch block ST depression, probably  rate related ST elevation, consider lateral injury No significant change since last tracing Confirmed by Deno Etienne [id number]) on 08/19/2021 5:14:31 AM  Radiology CT CHEST ABDOMEN PELVIS WO CONTRAST  Result Date: 08/19/2021 CLINICAL DATA:  Tachycardia. EXAM: CT CHEST, ABDOMEN AND PELVIS WITHOUT CONTRAST TECHNIQUE: Multidetector CT imaging of the chest, abdomen and pelvis was performed following the standard protocol without IV contrast. RADIATION DOSE REDUCTION: This exam was performed according to the departmental dose-optimization program which includes automated exposure control, adjustment of the mA and/or kV according to patient size and/or use of iterative reconstruction technique. COMPARISON:  Chest CT 12/05/2020.  Abdominal CT 09/18/2007 FINDINGS: CT CHEST FINDINGS Cardiovascular: Enlarged heart. No pericardial effusion. No acute vascular finding. Prominent size of peripheral pulmonary arteries implying pulmonary hypertension although the main pulmonary artery does not appear particularly enlarged. Dialysis catheter with tip at the upper right atrium. Mediastinum/Nodes: Negative for adenopathy or mass. Lungs/Pleura: There is no edema, consolidation, effusion, or pneumothorax. Musculoskeletal: Bridging thoracic osteophytes CT ABDOMEN PELVIS FINDINGS Hepatobiliary: Lobulation of the transplanted liver surface. No acute or focal finding.No evidence of biliary obstruction or stone. Pancreas: Unremarkable. Spleen: Chronic splenic enlargement, likely from portal hypertension.  Adrenals/Urinary Tract: Negative adrenals. No hydronephrosis or stone. Isodense 19 mm nodule from the upper pole right kidney. 2.4 cm isodense nodule from the lower pole left kidney. Simple and proteinaceous cysts were noted on abdominal MRI from earlier this year noted in care everywhere. Unremarkable bladder. Stomach/Bowel: Colonic diverticulosis. No evidence of bowel obstruction or visible inflammation Vascular/Lymphatic: Atheromatous calcifications. Dilated IVC and pelvic veins. Aneurysmal enlargement of the left common iliac artery to 3.5 cm diameter, unchanged from abdominal MRI report 04/03/2021 [facility]. There is indistinct soft tissue density along the left pelvic sidewall measuring up to 4.4 cm, it is unclear if this is adenopathy or vessel. The density and discrete appearance is not suggestive of hematoma. No generalized adenopathy. Reproductive:No pathologic findings. Other: Small volume ascites. Musculoskeletal: No acute finding.   L2-S1 lumbar fusion. IMPRESSION: 1. Indeterminate soft tissue density along the left pelvic sidewall which could be adenopathy or abnormal vessel. Postcontrast imaging is recommended when clinically possible. 2. No acute finding in the chest.  Chronic cardiomegaly. 3. Transplanted liver. Electronically Signed   By: Jorje Guild M.D.   On: 08/19/2021 06:02   DG Chest Port 1 View  Result Date: 08/19/2021 CLINICAL DATA:  Shortness of breath. EXAM: PORTABLE CHEST 1 VIEW COMPARISON:  07/16/2021 FINDINGS: The heart is enlarged the mediastinal contour is stable. The pulmonary vasculature is distended. Mild airspace disease is present bilaterally. A stable right internal jugular central venous catheter is noted. No effusion or pneumothorax. No acute osseous abnormality. IMPRESSION: Cardiomegaly with pulmonary vascular congestion. Mild airspace disease is noted bilaterally, possible edema or infiltrate. Electronically Signed   By: Brett Fairy M.D.   On: 08/19/2021 02:59     Procedures .Paracentesis  Date/Time: 08/19/2021 4:04 AM  Performed by: Deno Etienne, DO Authorized by: Deno Etienne, DO   Consent:    Consent obtained:  Verbal   Consent given by:  Patient   Risks, benefits, and alternatives were discussed: yes     Risks discussed:  Bleeding, bowel perforation, infection and pain   Alternatives discussed:  No treatment, delayed treatment, alternative treatment and observation Universal protocol:    Procedure explained and questions answered to patient or proxy's satisfaction: yes     Immediately prior to procedure, a time out was called: yes     Patient identity confirmed:  Verbally with patient Pre-procedure details:    Procedure purpose:  Diagnostic   Preparation: Patient was prepped and draped in usual sterile fashion   Anesthesia:    Anesthesia method:  Local infiltration   Local anesthetic:  Lidocaine 2% WITH epi Procedure details:    Needle gauge:  18   Puncture site:  R lower quadrant   Fluid removed amount:  2.3L   Fluid characteristics: tomato juice colored.   Dressing:  Adhesive bandage (dermabonded) Post-procedure details:    Procedure completion:  Tolerated well, no immediate complications Ultrasound ED Echo  Date/Time: 08/19/2021 5:28 AM  Performed by: Deno Etienne, DO Authorized by: Deno Etienne, DO   Procedure details:    Indications: dyspnea     Views: subxiphoid, parasternal short axis view and apical 4 chamber view     Images: archived     Limitations:  Body habitus and positioning Findings:    Pericardium: no pericardial effusion     LV Function: depressed (30 - 50%)     RV Diameter: normal   Impression:    Impression: decreased contractility       Medications Ordered in ED Medications  lidocaine-EPINEPHrine (XYLOCAINE W/EPI) 2 %-1:200000 (PF) injection 10 mL (10 mLs Intradermal Given by Other 08/19/21 9242)    ED Course/ Medical Decision Making/ A&P                           Medical Decision Making Amount and/or  Complexity of Data Reviewed Labs: ordered. Radiology: ordered. ECG/medicine tests: ordered.  Risk Prescription drug management. Decision regarding hospitalization.   66 yo M with a chief complaints of difficulty breathing and a rapid heart rate.  Found to be in SVT with EMS.  Here he is in bigeminy with rate of about 140.  Patient feels like he would be much improved with paracentesis.  We will attempt to perform at bedside.  Laboratory evaluation chest x-ray reassess.  No significant anemia, no significant electrolyte abnormality. Cxr independently interpreted by me without focal infiltrate.   Patient feeling much better after paracentesis.  He got up to go to the bathroom and his heart rate again went into the 200s.  Seemed very regular when I came to evaluate very quickly resolved back into the 130s and 140s.  I had nursing staff attempt to ambulate the patient and again with just a few steps went into the 200s and then upon my repeat evaluation was back in the 140s.  Was unable to capture this on EKG.  We will add on thyroid studies.  CT chest abdomen pelvis.  Will discuss with cardiology.  Bedside ultrasound was without obvious pericardial effusion.  I discussed case with cardiology on-call, Dr. Cathie Hoops, agreed with going forward with thyroid studies and CT scan to evaluate for possible underlying pathology.  They will have the day team notified to come and formally consult on the patient in the morning.  Will discuss with medicine for admission.  CT scan of the chest and pelvis without contrast without obvious concerning etiology for symptoms.  There was a soft tissue density about the left pelvic sidewall.  CRITICAL CARE Performed by: Cecilio Asper   Total critical care time: 80 minutes  Critical care time was exclusive of separately billable procedures and treating other patients.  Critical care was necessary to treat or prevent imminent or life-threatening  deterioration.  Critical care was time spent personally by me on the following activities: development  of treatment plan with patient and/or surrogate as well as nursing, discussions with consultants, evaluation of patient's response to treatment, examination of patient, obtaining history from patient or surrogate, ordering and performing treatments and interventions, ordering and review of laboratory studies, ordering and review of radiographic studies, pulse oximetry and re-evaluation of patient's condition.  The patients results and plan were reviewed and discussed.   Any x-rays performed were independently reviewed by myself.   Differential diagnosis were considered with the presenting HPI.  Medications  lidocaine-EPINEPHrine (XYLOCAINE W/EPI) 2 %-1:200000 (PF) injection 10 mL (10 mLs Intradermal Given by Other 08/19/21 0339)    Vitals:   08/19/21 0444 08/19/21 0445 08/19/21 0515 08/19/21 0530  BP:  (!) 119/47 (!) 108/51 (!) 95/59  Pulse:  70 72 (!) 104  Resp:  (!) 28 (!) 29 (!) 32  Temp:      TempSrc:      SpO2: 99% 99% 100% 99%  Weight:      Height:        Final diagnoses:  SVT (supraventricular tachycardia) (HCC)    Admission/ observation were discussed with the admitting physician, patient and/or family and they are comfortable with the plan.          Final Clinical Impression(s) / ED Diagnoses Final diagnoses:  SVT (supraventricular tachycardia) [facility])    Rx / DC Orders ED Discharge Orders     None         Deno Etienne, DO 08/19/21 [phone]

## 2021-08-19 NOTE — Plan of Care (Signed)
  Problem: Education: Goal: Knowledge of General Education information will improve Description: Including pain rating scale, medication(s)/side effects and non-pharmacologic comfort measures Outcome: Progressing   Problem: Clinical Measurements: Goal: Ability to maintain clinical measurements within normal limits will improve Outcome: Progressing   

## 2021-08-19 NOTE — H&P (Signed)
History and Physical    Patient: Gregory Tanner DQQ:[id number] DOB: September 06, 1955 DOA: 08/19/2021 DOS: the patient was seen and examined on 08/19/2021 PCP: Ronita Hipps, MD  Patient coming from: Home - lives with wife; NOK: Wife, [phone]   Chief Complaint: Rapid heart rate  HPI: Gregory Tanner is a 66 y.o. male with medical history significant of HTN; chronic diastolic CHF; chronic pain; ESRD on MWF HD; DM; and NASH cirrhosis with hepatocellular CA s/p liver transplant presenting with rapid heart rate.  He has known tachycardia and CHF, has been told he has <1 year.  He went to the bathroom yesterday and felt completely wiped out.   His HR was 234 when EMS arrived, unable to catch his breath, +substernal CP.  His HR is usually in the 90-150 range.  He doesn't usually do anything for tachycardia but it was significantly higher yesterday.  He did have paracentesis in the ER and that generally helps with the breathing.  He has O2 at home - for ventilation rather than oxygenation.  He gets paracentesis q3 days, does not have a Pleurex catheter.  He has some difficulty with maintaining BP during HD, had full session yesterday with Midodrine twice (standard).    ER Course:  Carryover, per Dr. Alcario Drought:   Pt with ESRD, h/o liver transplant.  In to ED with SVT with HR 220s.  Got adenosine, converted.  Since then HR 100-140 in bigeminy.     Ascites, got drained in ED.   HR back to 200s when walking again.   Cards fellow consulted.  Will see in AM.   Getting CT CAP.     Review of Systems: As mentioned in the history of present illness. All other systems reviewed and are negative. Past Medical History:  Diagnosis Date   Ankylosis of lumbar spine 02/21/2014   Benign essential hypertension 10/26/2015   Chronic diastolic heart failure ([place]) 09/26/2016   Chronic pain associated with significant psychosocial dysfunction 06/17/2015   Degeneration of intervertebral disc of lumbar region  07/05/2013   ESRD (end stage renal disease) on dialysis ([place]) 03/27/2017   Hearing loss    Hepatocellular carcinoma ([place]) 12/27/2010   Overview:  Embolized 6/12    History of liver transplant ([place]) 07/20/2011   Overview:  06/07/2011 for cryptogenic cirrhosis and HCC    Uncontrolled type 2 diabetes mellitus with insulin therapy 06/21/2018   Past Surgical History:  Procedure Laterality Date   AV FISTULA PLACEMENT Left 07/16/2021   Procedure: LEFT ARM RADIOCEPHALIC ARTERIOVENOUS  FISTULA CREATION;  Surgeon: [name], MD;  Location: [facility];  Service: Vascular;  Laterality: Left;   [place] STUDY  12/14/2020   Procedure: BUBBLE STUDY;  Surgeon: [name], MD;  Location: [facility];  Service: Cardiovascular;;   EYE SURGERY Left    HERNIA REPAIR     IR PARACENTESIS  11/30/2020   IR PARACENTESIS  05/20/2021   IR PARACENTESIS  06/01/2021   IR PARACENTESIS  06/17/2021   IR PARACENTESIS  07/13/2021   IR PARACENTESIS  07/20/2021   IR PARACENTESIS  07/27/2021   IR PARACENTESIS  07/30/2021   IR PARACENTESIS  08/02/2021   IR PARACENTESIS  08/09/2021   LIVER SURGERY     IMPLANT   LIVER TRANSPLANT     RIGHT HEART CATH N/A 12/11/2020   Procedure: RIGHT HEART CATH;  Surgeon: [name], MD;  Location: [place] [facility];  Service: Cardiovascular;  Laterality: N/A;   TEE WITHOUT CARDIOVERSION N/A 12/14/2020   Procedure: TRANSESOPHAGEAL ECHOCARDIOGRAM (TEE);  Surgeon: [name], MD;  Location: [facility] ENDOSCOPY;  Service: Cardiovascular;  Laterality: N/A;   TONSILLECTOMY     Social History:  reports that he has never smoked. He has never used smokeless tobacco. He reports current alcohol use. He reports that he does not currently use drugs.  Allergies  Allergen Reactions   Iodinated Contrast Media Hives and Other (See Comments)    Allergy is NOT to all contrast - but patient is unsure of which particular one his  allergy is to. Contrast dye used before liver transplant cause hives, not sure which dye this was but is able to use others   Jardiance [Empagliflozin] Dermatitis    developed blisters with the medication, blisters  stopped after he discontinued taking it. (About 6 months ago /~06/2020)   Latex Other (See Comments)    Skin peeling - adhesive    Metformin And Related Nausea And Vomiting   Other     Patient states that he is allergic to "some antibiotic" but is not sure of the name of it.   Rosiglitazone Nausea And Vomiting    GI effects and abdominal pain    Family History  Problem Relation Age of Onset   Cancer Mother    Diabetes Father    Heart attack Father    Schizophrenia Father    Hypertension Brother     Prior to Admission medications   Medication Sig Start Date End Date Taking? Authorizing Provider  allopurinol (ZYLOPRIM) 300 MG tablet Take 150 mg by mouth daily. 05/07/18   [provider]  bumetanide (BUMEX) 2 MG tablet Take 4 mg by mouth See admin instructions. Take 4 mg twice daily on non dialysis days. Tuesday, Thursday, and Sunday. 12/22/20 12/22/21  [provider]  dextromethorphan-guaiFENesin (MUCINEX DM) 30-600 MG 12hr tablet Take 1 tablet by mouth 2 (two) times daily as needed for cough. Patient not taking: Reported on 07/14/2021 12/03/20   [name], MD  insulin lispro (HUMALOG) 100 UNIT/ML KwikPen Inject 2-24 Units into the skin See admin instructions. Sliding scale 150-200= 2 units 201-250=3 units 251-300=4 units 301-350=5 units 8 units for small meals 24 units for High carbohydrates 09/19/20   [provider]  midodrine (PROAMATINE) 10 MG tablet Take 10 mg by mouth See admin instructions. Take '10mg'$  before dialysis on Monday, Wednesday, Friday, and  some Saturday depend on food retention 04/21/21   [provider]  Multiple Vitamin (MULTIVITAMIN) tablet Take 1 tablet by mouth daily.    [provider]  mupirocin  ointment (BACTROBAN) 2 % Apply 1 application. topically daily as needed (apply to posterior). 07/08/21   [provider]  mycophenolate (CELLCEPT) 250 MG capsule Take 4 capsules (1,000 mg total) by mouth in the morning and at bedtime. 12/03/20   [name], MD  nystatin cream (MYCOSTATIN) Apply 1 application. topically daily as needed (apply to posterior before dialysis).    [provider]  oxyCODONE-acetaminophen (PERCOCET) 5-325 MG tablet Take 1 tablet by mouth every 4 (four) hours as needed for severe pain. 07/16/21 07/16/22  Schuh, McKenzi P, PA-C  Probiotic Product (PROBIOTIC PO) Take 1 tablet by mouth daily.    [provider]  sevelamer carbonate (RENVELA) 800 MG tablet Take 800 mg by mouth 3 (three) times daily with meals.    [provider]  TART CHERRY  PO Take 1 tablet by mouth in the morning and at bedtime.    [provider]  triamcinolone ointment (KENALOG) 0.1 % Apply 1 application. topically 2 (two) times daily. Patient not taking: Reported on 07/14/2021 03/01/21   [provider]  trolamine salicylate (ASPERCREME) 10 % cream Apply 1 application. topically daily as needed (pain in posterior).    [provider]    Physical Exam: Vitals:   08/19/21 1300 08/19/21 1400 08/19/21 1430 08/19/21 1500  BP: 118/68 (!) 116/94 (!) 115/53 (!) 124/94  Pulse: (!) 110 (!) 107 (!) 109 (!) 110  Resp: (!) 23 (!) 30 (!) 31 (!) 27  Temp:      TempSrc:      SpO2: 100% 98% 100% 100%  Weight:      Height:       General:  Appears calm and comfortable and is in NAD Eyes:  EOMI, normal lids, iris ENT:  grossly normal hearing, lips & tongue, mmm Neck:  no LAD, masses or thyromegaly Cardiovascular:  RR with tachycardia to 105 range, no m/r/g. 1-2+ LE edema.  Respiratory:   CTA bilaterally with no wheezes/rales/rhonchi.  Mildly increased respiratory effort. Abdomen:  ongoing ascites with fluid wave but no longer taut Skin:  no rash or  induration seen on limited exam Musculoskeletal:  grossly normal tone BUE/BLE, good ROM, no bony abnormality Psychiatric:  grossly normal mood and affect, speech fluent and appropriate, AOx3 Neurologic:  CN 2-12 grossly intact, moves all extremities in coordinated fashion   Radiological Exams on Admission: Independently reviewed - see discussion in A/P where applicable  CT CHEST ABDOMEN PELVIS WO CONTRAST  Result Date: 08/19/2021 CLINICAL DATA:  Tachycardia. EXAM: CT CHEST, ABDOMEN AND PELVIS WITHOUT CONTRAST TECHNIQUE: Multidetector CT imaging of the chest, abdomen and pelvis was performed following the standard protocol without IV contrast. RADIATION DOSE REDUCTION: This exam was performed according to the departmental dose-optimization program which includes automated exposure control, adjustment of the mA and/or kV according to patient size and/or use of iterative reconstruction technique. COMPARISON:  Chest CT 12/05/2020.  Abdominal CT 09/18/2007 FINDINGS: CT CHEST FINDINGS Cardiovascular: Enlarged heart. No pericardial effusion. No acute vascular finding. Prominent size of peripheral pulmonary arteries implying pulmonary hypertension although the main pulmonary artery does not appear particularly enlarged. Dialysis catheter with tip at the upper right atrium. Mediastinum/Nodes: Negative for adenopathy or mass. Lungs/Pleura: There is no edema, consolidation, effusion, or pneumothorax. Musculoskeletal: Bridging thoracic osteophytes CT ABDOMEN PELVIS FINDINGS Hepatobiliary: Lobulation of the transplanted liver surface. No acute or focal finding.No evidence of biliary obstruction or stone. Pancreas: Unremarkable. Spleen: Chronic splenic enlargement, likely from portal hypertension. Adrenals/Urinary Tract: Negative adrenals. No hydronephrosis or stone. Isodense 19 mm nodule from the upper pole right kidney. 2.4 cm isodense nodule from the lower pole left kidney. Simple and proteinaceous cysts were noted  on abdominal MRI from earlier this year noted in care everywhere. Unremarkable bladder. Stomach/Bowel: Colonic diverticulosis. No evidence of bowel obstruction or visible inflammation Vascular/Lymphatic: Atheromatous calcifications. Dilated IVC and pelvic veins. Aneurysmal enlargement of the left common iliac artery to 3.5 cm diameter, unchanged from abdominal MRI report 04/03/2021 at [facility]. There is indistinct soft tissue density along the left pelvic sidewall measuring up to 4.4 cm, it is unclear if this is adenopathy or vessel. The density and discrete appearance is not suggestive of hematoma. No generalized adenopathy. Reproductive:No pathologic findings. Other: Small volume ascites. Musculoskeletal: No acute finding.   L2-S1 lumbar fusion. IMPRESSION: 1. Indeterminate soft tissue density  along the left pelvic sidewall which could be adenopathy or abnormal vessel. Postcontrast imaging is recommended when clinically possible. 2. No acute finding in the chest.  Chronic cardiomegaly. 3. Transplanted liver. Electronically Signed   By: Jorje Guild M.D.   On: 08/19/2021 06:02   DG Chest Port 1 View  Result Date: 08/19/2021 CLINICAL DATA:  Shortness of breath. EXAM: PORTABLE CHEST 1 VIEW COMPARISON:  07/16/2021 FINDINGS: The heart is enlarged the mediastinal contour is stable. The pulmonary vasculature is distended. Mild airspace disease is present bilaterally. A stable right internal jugular central venous catheter is noted. No effusion or pneumothorax. No acute osseous abnormality. IMPRESSION: Cardiomegaly with pulmonary vascular congestion. Mild airspace disease is noted bilaterally, possible edema or infiltrate. Electronically Signed   By: Brett Fairy M.D.   On: 08/19/2021 02:59    EKG: Independently reviewed.   0223 - Sinus tachycardia with rate 148; nonspecific ST changes that are likely rate-related 0511 - Sinus tachycardia with rate 149; nonspecific ST changes that are likely rate-related   Labs  on Admission: I have personally reviewed the available labs and imaging studies at the time of the admission.  Pertinent labs:    Na++ 133 Glucose 123  BUN 22/Creatinine 3.04/GFR 22 - stable Albumin 3.1 BNP 1348.5 WBC 7.7 Hgb 11.8 Normal TSH and free T4   Assessment and Plan: Principal Problem:   SVT (supraventricular tachycardia) (HCC) Active Problems:   Type 2 diabetes mellitus (HCC)   History of liver transplant ([place])   Goals of care, counseling/discussion   Acute on chronic diastolic CHF (congestive heart failure) (HCC)   Hypervolemia associated with renal insufficiency   ESRD on dialysis (HCC)   Hypotension    SVT -Patient with known h/o CHF and baseline periodic tachycardia presenting with HR into the 200s -He was given adenosine with improvement to the 140s -He appears to need rate-controlling medication, but he struggles with hypotension and so this may be problematic -Will observe on progressive care unit -Cardiology consult  Volume overload in an ESRD on HD patient -Patient has multisystem organ dysfunction with cardiorenal syndrome plus recurrent ascites - he is massively volume overloaded and yet despite 4x/week HD he continues to be too hypotensive to get effective diuresis at HD -Certainly, he needs fluid restriction and very low salt intake on an ongoing basis  -Nephrology consulted for assistance in managing this challenging situation, planning to do HD   Acute on chronic diastolic CHF -Prior echos with preserved EF but grade 2 diastolic dysfunction -Has ascites and generalized anasarca -Volume control with HD -Will rcontinue Bumetanide -Continue ASA   Recurrent hypotension -He is now taking midodrine pre-HD and again during HD   DM -Prior A1c was 5.2, indicating good control -He appears to no longer be taking medications other than SSI maybe 2x/week -Cover with sensitive-scale SSI    H/o liver transplant -Continue Cellcept -Had paracentesis in  the ER with >2L removed and symptomatic improvement -IR consult for possible PleurX given that he is requiring drainage q 3 days -He reports that his liver is working well and that all of his ascites is related to heart failure   HLD -He is no longer taking medications for this issue   Goals of care -Per [facility] note on 3/28, "he has a very complex interplay of multiorgan system dysfunction" ... with a "progressive pathology that will inevidtably lead to worsening cardiac and also renal functioning" and "medical therapy does not reverse or delay the natural history of this  disorder" -He acknowledges that his prognosis is poor, "less than a year" -However, he remains full code and is not hospice-eligible since he is still on HD -I have requested palliative care consultation to assist     Advance Care Planning:   Code Status: Full Code   Consults: Cardiology; nephrology; IR (brief consult note); palliative care  DVT Prophylaxis: Heparin  Family Communication: Wife was present throughout evaluation  Severity of Illness: The appropriate patient status for this patient is OBSERVATION. Observation status is judged to be reasonable and necessary in order to provide the required intensity of service to ensure the patient's safety. The patient's presenting symptoms, physical exam findings, and initial radiographic and laboratory data in the context of their medical condition is felt to place them at decreased risk for further clinical deterioration. Furthermore, it is anticipated that the patient will be medically stable for discharge from the hospital within 2 midnights of admission.   Author: Karmen Bongo, MD 08/19/2021 4:41 PM  For on call review [email or web address].

## 2021-08-19 NOTE — ED Notes (Signed)
This RN at bedside with Dr. Tyrone Nine, consent obtained prior to procedure. Witnessed by this RN and Dr. Tyrone Nine.

## 2021-08-19 NOTE — ED Notes (Signed)
Pt given lunch

## 2021-08-20 DIAGNOSIS — I471 Supraventricular tachycardia: Secondary | ICD-10-CM | POA: Diagnosis not present

## 2021-08-20 LAB — CBC
HCT: 35.9 % — ABNORMAL LOW (ref 39.0–52.0)
Hemoglobin: 11.4 g/dL — ABNORMAL LOW (ref 13.0–17.0)
MCH: 30.6 pg (ref 26.0–34.0)
MCHC: 31.8 g/dL (ref 30.0–36.0)
MCV: 96.5 fL (ref 80.0–100.0)
Platelets: 135 10*3/uL — ABNORMAL LOW (ref 150–400)
RBC: 3.72 MIL/uL — ABNORMAL LOW (ref 4.22–5.81)
RDW: 16.1 % — ABNORMAL HIGH (ref 11.5–15.5)
WBC: 7.3 10*3/uL (ref 4.0–10.5)
nRBC: 0 % (ref 0.0–0.2)

## 2021-08-20 LAB — GLUCOSE, CAPILLARY
Glucose-Capillary: 121 mg/dL — ABNORMAL HIGH (ref 70–99)
Glucose-Capillary: 123 mg/dL — ABNORMAL HIGH (ref 70–99)
Glucose-Capillary: 98 mg/dL (ref 70–99)

## 2021-08-20 LAB — COMPREHENSIVE METABOLIC PANEL
ALT: 17 U/L (ref 0–44)
AST: 23 U/L (ref 15–41)
Albumin: 3.1 g/dL — ABNORMAL LOW (ref 3.5–5.0)
Alkaline Phosphatase: 114 U/L (ref 38–126)
Anion gap: 12 (ref 5–15)
BUN: 37 mg/dL — ABNORMAL HIGH (ref 8–23)
CO2: 20 mmol/L — ABNORMAL LOW (ref 22–32)
Calcium: 7.9 mg/dL — ABNORMAL LOW (ref 8.9–10.3)
Chloride: 94 mmol/L — ABNORMAL LOW (ref 98–111)
Creatinine, Ser: 3.91 mg/dL — ABNORMAL HIGH (ref 0.61–1.24)
GFR, Estimated: 16 mL/min — ABNORMAL LOW (ref >60)
Glucose, Bld: 110 mg/dL — ABNORMAL HIGH (ref 70–99)
Potassium: 5.1 mmol/L (ref 3.5–5.1)
Sodium: 126 mmol/L — ABNORMAL LOW (ref 135–145)
Total Bilirubin: 0.7 mg/dL (ref 0.3–1.2)
Total Protein: 4.9 g/dL — ABNORMAL LOW (ref 6.5–8.1)

## 2021-08-20 LAB — HEPATITIS B SURFACE ANTIBODY, QUANTITATIVE: Hep B S AB Quant (Post): <3.1 m[IU]/mL — ABNORMAL LOW (ref >9.9)

## 2021-08-20 MED ORDER — MIDODRINE HCL 5 MG PO TABS
10.0000 mg | ORAL_TABLET | ORAL | Status: DC
Start: 2021-08-20 — End: 2021-08-20

## 2021-08-20 MED ORDER — MIDODRINE HCL 5 MG PO TABS
10.0000 mg | ORAL_TABLET | ORAL | Status: DC
  Administered 2021-08-20 – 2021-08-28 (×12): 10 mg via ORAL
  Filled 2021-08-20 (×11): qty 2

## 2021-08-20 MED ORDER — MIDODRINE HCL 5 MG PO TABS: 10.0000 mg | ORAL_TABLET | ORAL | Status: DC

## 2021-08-20 MED ORDER — HEPARIN SODIUM (PORCINE) 1000 UNIT/ML DIALYSIS
1000.0000 [IU] | INTRAMUSCULAR | Status: DC | PRN
Start: 2021-08-20 — End: 2021-08-20
  Filled 2021-08-20: qty 1

## 2021-08-20 MED ORDER — AMIODARONE HCL 200 MG PO TABS
400.0000 mg | ORAL_TABLET | Freq: Two times a day (BID) | ORAL | Status: AC
Start: 2021-08-20 — End: 2021-08-25
  Administered 2021-08-20 – 2021-08-25 (×12): 400 mg via ORAL
  Filled 2021-08-20 (×12): qty 2

## 2021-08-20 MED ORDER — CETAPHIL MOISTURIZING EX LOTN
TOPICAL_LOTION | CUTANEOUS | Status: DC | PRN
  Filled 2021-08-20: qty 473

## 2021-08-20 MED ORDER — ANTICOAGULANT SODIUM CITRATE 4% (200MG/5ML) IV SOLN: 5.0000 mL | Status: DC | PRN

## 2021-08-20 MED ORDER — ALTEPLASE 2 MG IJ SOLR: 2.0000 mg | Freq: Once | INTRAMUSCULAR | Status: DC | PRN

## 2021-08-20 NOTE — Progress Notes (Addendum)
PROGRESS NOTE  Gregory Tanner  VQQ:[id number] DOB: [birth year] DOA: 08/19/2021 PCP: Ronita Hipps, MD   Brief Narrative:  Patient is a 66 year old male with history of hypertension, chronic diastolic CHF, chronic pain syndrome, ESRD on dialysis on Monday, Wednesday, Friday, diabetes type 2, NASH cirrhosis with hepatocellular carcinoma status post liver transplant presented with rapid heart rate.  On presentation his heart rate was in the range of 200s.  He was complaining of shortness of breath, substernal chest pain.  EKG rhythm showed SVT, given adenosine and he and converted to normal sinus rhythm. patient was admitted for further management.  Cardiology, nephrology following.   Assessment & Plan:  Principal Problem:   SVT (supraventricular tachycardia) (HCC) Active Problems:   Type 2 diabetes mellitus ([place])   History of liver transplant ([place])   Goals of care, counseling/discussion   Acute on chronic diastolic CHF (congestive heart failure) (HCC)   Hypervolemia associated with renal insufficiency   ESRD on dialysis ([place])   Hypotension   SVT: Presented with rapid heart rate.  Heart rate in the range of 200s on presentation.  Was complaining of chest pain, dyspnea.  Given adenosine in the emergency department, converted to normal sinus rhythm.  He has intermittent SVT. This could be associated with volume overload.  Cannot tolerate AV nodal agents because of hypotension. He remains in sinus tachycardia this morning, heart rate ranging from 110-120.  Cardiology started on amiodarone.  Volume overload/ESRD on dialysis: Dialyzed on Monday, Wednesday, Friday.  Nephrology following, dialysis as scheduled.  Has dialysis catheter on the right chest  Acute on chronic diastolic CHF: Associated with cardiorenal syndrome with renal disease.  Has ascites, generalized anasarca, lower extremity edema.  Volume management as per dialysis.  Continue bumetanide. Wound care consulted for bilateral lower  extremity ulcers associated with edema.  Hyponatremia: This is most likely hypervolemic hyponatremia secondary to volume overload.  Volume management as per dialysis  Liver cirrhosis/history of hepatocellular carcinoma/ascites: Gets regular paracentesis every 3 days.  Radiology consulted here and he underwent paracentesis.  History of liver transplantation: On CellCept.  IR also consulted for possible Pleurx catheter placement but as per IR he is not a candidate .  He follows with [facility]. Has chronic thrombocytopenia:  Chronic hypotension: On midodrine  Diabetes type 2: Recent hemoglobin A1c of 5.2, indicating good control.  Continue sliding scale insulin  Hyperlipidemia: Not on medications  Goals of care: Has multiorgan system dysfunction.  Palliative care consulted.  Remains full code for now.  We recommend hospice/ palliative approach               DVT prophylaxis:heparin injection 5,000 Units Start: 08/19/21 1400     Code Status: Full Code  Family Communication: Discussed with wife at bedside  Patient status:Inpatient  Patient is from :Home  Anticipated discharge EP:[facility]  Estimated DC date:1-2 days   Consultants: Cardiology, cardiology, nephrology  Procedures: Dialysis, paracentesis  Antimicrobials:  Anti-infectives (From admission, onward)    None       Subjective: Patient seen and examined at the bedside this morning.  Hemodynamically stable.  Sitting in the chair.  Appears comfortable overall.  EKG monitor showed sinus tachycardia in the range of 110s.  Denies chest pain or shortness of breath.  Wants to have regular diet.  Objective: Vitals:   08/19/21 1928 08/19/21 2359 08/20/21 0437 08/20/21 0747  BP: (!) 111/53 109/66 109/67 (!) 123/56  Pulse: (!) 111 (!) 110 (!) 110 73  Resp: 19 18  16 (!) 25  Temp: 97.8 F (36.6 C) 98.6 F (37 C) 98.3 F (36.8 C) 97.6 F (36.4 C)  TempSrc: Oral Oral Oral Oral  SpO2: 100% 96% 100% 99%  Weight:  91.4 kg  92.2 kg   Height:        Intake/Output Summary (Last 24 hours) at 08/20/2021 0803 Last data filed at 08/20/2021 0748 Gross per 24 hour  Intake 480 ml  Output --  Net 480 ml   Filed Weights   08/19/21 0223 08/19/21 1928 08/20/21 0437  Weight: 89.4 kg 91.4 kg 92.2 kg    Examination:  General exam: Overall comfortable, very deconditioned, chronically ill looking, weak HEENT: PERRL Respiratory system:  no wheezes or crackles  Cardiovascular system: Sinus tachycardia.  Dialysis catheter on the right chest Gastrointestinal system: Abdomen is distended, soft and nontender.  Ascites Central nervous system: Alert and oriented Extremities: Bilateral lower extremity pitting edema, no clubbing ,no cyanosis, TED hose bilaterally on legs Skin: No rashes, shallow ulcers on leg,no icterus     Data Reviewed: I have personally reviewed following labs and imaging studies  CBC: Recent Labs  Lab 08/19/21 0226 08/20/21 0204  WBC 7.7 7.3  NEUTROABS 6.1  --   HGB 11.8* 11.4*  HCT 37.0* 35.9*  MCV 99.2 96.5  PLT 149* 220*   Basic Metabolic Panel: Recent Labs  Lab 08/19/21 0226 08/20/21 0204  NA 133* 126*  K 5.0 5.1  CL 93* 94*  CO2 24 20*  GLUCOSE 123* 110*  BUN 22 37*  CREATININE 3.04* 3.91*  CALCIUM 7.9* 7.9*  MG 2.0  --      No results found for this or any previous visit (from the past 240 hour(s)).   Radiology Studies: CT CHEST ABDOMEN PELVIS WO CONTRAST  Result Date: 08/19/2021 CLINICAL DATA:  Tachycardia. EXAM: CT CHEST, ABDOMEN AND PELVIS WITHOUT CONTRAST TECHNIQUE: Multidetector CT imaging of the chest, abdomen and pelvis was performed following the standard protocol without IV contrast. RADIATION DOSE REDUCTION: This exam was performed according to the departmental dose-optimization program which includes automated exposure control, adjustment of the mA and/or kV according to patient size and/or use of iterative reconstruction technique. COMPARISON:  Chest CT  12/05/2020.  Abdominal CT 09/18/2007 FINDINGS: CT CHEST FINDINGS Cardiovascular: Enlarged heart. No pericardial effusion. No acute vascular finding. Prominent size of peripheral pulmonary arteries implying pulmonary hypertension although the main pulmonary artery does not appear particularly enlarged. Dialysis catheter with tip at the upper right atrium. Mediastinum/Nodes: Negative for adenopathy or mass. Lungs/Pleura: There is no edema, consolidation, effusion, or pneumothorax. Musculoskeletal: Bridging thoracic osteophytes CT ABDOMEN PELVIS FINDINGS Hepatobiliary: Lobulation of the transplanted liver surface. No acute or focal finding.No evidence of biliary obstruction or stone. Pancreas: Unremarkable. Spleen: Chronic splenic enlargement, likely from portal hypertension. Adrenals/Urinary Tract: Negative adrenals. No hydronephrosis or stone. Isodense 19 mm nodule from the upper pole right kidney. 2.4 cm isodense nodule from the lower pole left kidney. Simple and proteinaceous cysts were noted on abdominal MRI from earlier this year noted in care everywhere. Unremarkable bladder. Stomach/Bowel: Colonic diverticulosis. No evidence of bowel obstruction or visible inflammation Vascular/Lymphatic: Atheromatous calcifications. Dilated IVC and pelvic veins. Aneurysmal enlargement of the left common iliac artery to 3.5 cm diameter, unchanged from abdominal MRI report 04/03/2021 at [facility]. There is indistinct soft tissue density along the left pelvic sidewall measuring up to 4.4 cm, it is unclear if this is adenopathy or vessel. The density and discrete appearance is not suggestive of hematoma. No generalized  adenopathy. Reproductive:No pathologic findings. Other: Small volume ascites. Musculoskeletal: No acute finding.   L2-S1 lumbar fusion. IMPRESSION: 1. Indeterminate soft tissue density along the left pelvic sidewall which could be adenopathy or abnormal vessel. Postcontrast imaging is recommended when clinically  possible. 2. No acute finding in the chest.  Chronic cardiomegaly. 3. Transplanted liver. Electronically Signed   By: Jorje Guild M.D.   On: 08/19/2021 06:02   DG Chest Port 1 View  Result Date: 08/19/2021 CLINICAL DATA:  Shortness of breath. EXAM: PORTABLE CHEST 1 VIEW COMPARISON:  07/16/2021 FINDINGS: The heart is enlarged the mediastinal contour is stable. The pulmonary vasculature is distended. Mild airspace disease is present bilaterally. A stable right internal jugular central venous catheter is noted. No effusion or pneumothorax. No acute osseous abnormality. IMPRESSION: Cardiomegaly with pulmonary vascular congestion. Mild airspace disease is noted bilaterally, possible edema or infiltrate. Electronically Signed   By: Brett Fairy M.D.   On: 08/19/2021 02:59    Scheduled Meds:  allopurinol  150 mg Oral Daily   [START ON 08/22/2021] bumetanide  4 mg Oral Once per day on Sun Tue Thu   Chlorhexidine Gluconate Cloth  6 each Topical Q0600   docusate sodium  100 mg Oral BID   heparin  5,000 Units Subcutaneous Q8H   insulin aspart  0-6 Units Subcutaneous TID WC   midodrine  10 mg Oral Q M,W,F,Sa-1800   mycophenolate  1,000 mg Oral BID   sodium chloride flush  3 mL Intravenous Q12H   Continuous Infusions:   LOS: 0 days   Shelly Coss, MD [facility] P7/08/2021, 8:03 AM

## 2021-08-20 NOTE — Consult Note (Addendum)
[facility] Nurse Consult Note: Patient receiving care in [place] Reason for Consult: BLE Wound type: Chronic BLE edema from fluid overload (per pt) States he changes wraps on his legs every other day. HH on M/F and his wife does it on Wed. No open wounds just dry skin. Wife states she puts a white cream over the one wound on his left upper shin then what looks like a calcium alginate. This wound appears to be healed so we will go with removing the tubular compression (not in [place]) and clean the legs with soap and water, rinse and pat dry then apply Cetaphil lotion over both LE and wrap with kerlix and Ace wrap. Change MWF.  Monitor the wound area(s) for worsening of condition such as: Signs/symptoms of infection, increase in size, development of or worsening of odor, development of pain, or increased pain at the affected locations.   Notify the medical team if any of these develop.  Thank you for the consult. [facility] nurse will not follow at this time.   Please re-consult the [facility] team if needed.  Cathlean Marseilles Tamala Julian, MSN, RN, [place], [place]le [place], [facility] Wound Treatment Associate Pager [phone]

## 2021-08-20 NOTE — Progress Notes (Signed)
Received patient in bed, alert and oriented. Informed consent signed and in chart.  Time tx completed: 2122 Total tx time: 4hrs  HD treatment completed. Patient tolerated well. HD catheter without signs and symptoms of complications. Patient transported back to the room, alert and orient and in no acute distress. Report given to bedside RN.  Venous pressure was high 92mns towards the end of treatment.   Total UF removed: 4L BVP: 95.4  Heparin fills 1.635m Post HD VS: T97.8-108/66-111-18-100% '@2L'$  [place]  Post HD weight: 86.5kg

## 2021-08-20 NOTE — Care Management Obs Status (Signed)
[place] NOTIFICATION   Patient Details  Name: Gregory Tanner MRN: [id number] Date of Birth: [birth year]   Medicare Observation Status Notification Given:  Yes    Zenon Mayo, RN 08/20/2021, 2:56 PM

## 2021-08-20 NOTE — Progress Notes (Signed)
Pt receives out-pt HD at [facility] on MWF. Pt has a 12:00 chair time. Will assist as needed.   Melven Sartorius Renal Navigator [phone]

## 2021-08-20 NOTE — Progress Notes (Signed)
Progress Note  Patient Name: Gregory Tanner Date of Encounter: 08/20/2021  Primary Cardiologist: Shirlee More, MD   Subjective   Overnight had two spikes of tachycardia. Patient notes he was eating during the first spell and urinating during the second.  For the second had to sit and rest before it resolved.  He is asymptomatic during sinus tachycardia.  Inpatient Medications    Scheduled Meds:  allopurinol  150 mg Oral Daily   [START ON 08/22/2021] bumetanide  4 mg Oral Once per day on Sun Tue Thu   Chlorhexidine Gluconate Cloth  6 each Topical Q0600   docusate sodium  100 mg Oral BID   heparin  5,000 Units Subcutaneous Q8H   insulin aspart  0-6 Units Subcutaneous TID WC   midodrine  10 mg Oral Q M,W,F,Sa-1800   mycophenolate  1,000 mg Oral BID   sodium chloride flush  3 mL Intravenous Q12H   Continuous Infusions:  PRN Meds: acetaminophen **OR** acetaminophen, bisacodyl, hydrALAZINE, ondansetron **OR** ondansetron (ZOFRAN) IV, oxyCODONE, polyethylene glycol   Vital Signs    Vitals:   08/19/21 1928 08/19/21 2359 08/20/21 0437 08/20/21 0747  BP: (!) 111/53 109/66 109/67 (!) 123/56  Pulse: (!) 111 (!) 110 (!) 110 73  Resp: '19 18 16 '$ (!) 25  Temp: 97.8 F (36.6 C) 98.6 F (37 C) 98.3 F (36.8 C) 97.6 F (36.4 C)  TempSrc: Oral Oral Oral Oral  SpO2: 100% 96% 100% 99%  Weight: 91.4 kg  92.2 kg   Height:        Intake/Output Summary (Last 24 hours) at 08/20/2021 1021 Last data filed at 08/20/2021 0748 Gross per 24 hour  Intake 480 ml  Output --  Net 480 ml   Filed Weights   08/19/21 0223 08/19/21 1928 08/20/21 0437  Weight: 89.4 kg 91.4 kg 92.2 kg    Telemetry    Sinus tachy with two spells of atrial flutter with variable conduction - Personally Reviewed  Physical Exam   Gen: mild distress Neck: No JVD  Cardiac: No Rubs or Gallops, no Murmur, regular tachcardia, LV lift  Respiratory: Clear to auscultation bilaterally, normal effort, normal  respiratory  rate GI: Soft, nontender, non-distended  MS: No  edema;  moves all extremities Integument: Skin feels warm Neuro:  At time of evaluation, alert and oriented to person/place/time/situation  Psych: Normal affect, patient feels ok all things considered   Labs    Chemistry Recent Labs  Lab 08/19/21 0226 08/20/21 0204  NA 133* 126*  K 5.0 5.1  CL 93* 94*  CO2 24 20*  GLUCOSE 123* 110*  BUN 22 37*  CREATININE 3.04* 3.91*  CALCIUM 7.9* 7.9*  PROT 5.2* 4.9*  ALBUMIN 3.1* 3.1*  AST 27 23  ALT 22 17  ALKPHOS 134* 114  BILITOT 0.5 0.7  GFRNONAA 22* 16*  ANIONGAP 16* 12     Hematology Recent Labs  Lab 08/19/21 0226 08/20/21 0204  WBC 7.7 7.3  RBC 3.73* 3.72*  HGB 11.8* 11.4*  HCT 37.0* 35.9*  MCV 99.2 96.5  MCH 31.6 30.6  MCHC 31.9 31.8  RDW 16.2* 16.1*  PLT 149* 135*    Cardiac EnzymesNo results for input(s): "TROPONINI" in the last 168 hours. No results for input(s): "TROPIPOC" in the last 168 hours.   BNP Recent Labs  Lab 08/19/21 0226  BNP 1,348.5*     DDimer No results for input(s): "DDIMER" in the last 168 hours.   Radiology    CT CHEST  ABDOMEN PELVIS WO CONTRAST  Result Date: 08/19/2021 CLINICAL DATA:  Tachycardia. EXAM: CT CHEST, ABDOMEN AND PELVIS WITHOUT CONTRAST TECHNIQUE: Multidetector CT imaging of the chest, abdomen and pelvis was performed following the standard protocol without IV contrast. RADIATION DOSE REDUCTION: This exam was performed according to the departmental dose-optimization program which includes automated exposure control, adjustment of the mA and/or kV according to patient size and/or use of iterative reconstruction technique. COMPARISON:  Chest CT 12/05/2020.  Abdominal CT 09/18/2007 FINDINGS: CT CHEST FINDINGS Cardiovascular: Enlarged heart. No pericardial effusion. No acute vascular finding. Prominent size of peripheral pulmonary arteries implying pulmonary hypertension although the main pulmonary artery does not appear particularly  enlarged. Dialysis catheter with tip at the upper right atrium. Mediastinum/Nodes: Negative for adenopathy or mass. Lungs/Pleura: There is no edema, consolidation, effusion, or pneumothorax. Musculoskeletal: Bridging thoracic osteophytes CT ABDOMEN PELVIS FINDINGS Hepatobiliary: Lobulation of the transplanted liver surface. No acute or focal finding.No evidence of biliary obstruction or stone. Pancreas: Unremarkable. Spleen: Chronic splenic enlargement, likely from portal hypertension. Adrenals/Urinary Tract: Negative adrenals. No hydronephrosis or stone. Isodense 19 mm nodule from the upper pole right kidney. 2.4 cm isodense nodule from the lower pole left kidney. Simple and proteinaceous cysts were noted on abdominal MRI from earlier this year noted in care everywhere. Unremarkable bladder. Stomach/Bowel: Colonic diverticulosis. No evidence of bowel obstruction or visible inflammation Vascular/Lymphatic: Atheromatous calcifications. Dilated IVC and pelvic veins. Aneurysmal enlargement of the left common iliac artery to 3.5 cm diameter, unchanged from abdominal MRI report 04/03/2021 at [facility]. There is indistinct soft tissue density along the left pelvic sidewall measuring up to 4.4 cm, it is unclear if this is adenopathy or vessel. The density and discrete appearance is not suggestive of hematoma. No generalized adenopathy. Reproductive:No pathologic findings. Other: Small volume ascites. Musculoskeletal: No acute finding.   L2-S1 lumbar fusion. IMPRESSION: 1. Indeterminate soft tissue density along the left pelvic sidewall which could be adenopathy or abnormal vessel. Postcontrast imaging is recommended when clinically possible. 2. No acute finding in the chest.  Chronic cardiomegaly. 3. Transplanted liver. Electronically Signed   By: Jorje Guild M.D.   On: 08/19/2021 06:02   DG Chest Port 1 View  Result Date: 08/19/2021 CLINICAL DATA:  Shortness of breath. EXAM: PORTABLE CHEST 1 VIEW COMPARISON:  07/16/2021  FINDINGS: The heart is enlarged the mediastinal contour is stable. The pulmonary vasculature is distended. Mild airspace disease is present bilaterally. A stable right internal jugular central venous catheter is noted. No effusion or pneumothorax. No acute osseous abnormality. IMPRESSION: Cardiomegaly with pulmonary vascular congestion. Mild airspace disease is noted bilaterally, possible edema or infiltrate. Electronically Signed   By: Brett Fairy M.D.   On: 08/19/2021 02:59     Patient Profile     66 y.o. male intermittent palpitations in the setting of prior liver transplant, ascites, and ESRD  Assessment & Plan    Paroxsymal atrial flutter - Patient presented with HR 210 with EMS (strips were lost) - Given adenosine in the ER with some improvement in HR to the 140s  - Maintain K>4, mag >2.  - will not tolerate AV nodal agents with prior attempts and hypotension interring with HD We will not start [facility]; we have discussion with patient and wife and long term GOC - will attempt amiodarone 400 mg PO BID because of limited options (cannot use 1C agent), despite his liver disease  - if RVR with HD will order IV amiodarone bolus and rip   Volume Overload in an  ESRD on HD patient Acute on chronic diastolic CHF  Severe Pulmonary HTN  High Output Heart Failure  - Patient has multisystem organ dysfunction  - Patient typically followed by [facility]. Was recently given 1 year prognosis  - Hold home BP medications to allow better dialysis  - Patient produces very little urine, does take bumex which has been continued here   Otherwise per primary  - Diabetes  - H/o liver transplant  - Goals of care-- Palliative care consulted. May be willing to consider hospice for assistance with home O2 in the future    For questions or updates, please contact [facility] Please consult [email or web address] for contact info under Cardiology/STEMI.      Rudean Haskell, MD [place], [address] [place], [place] [address] [phone]  10:21 AM

## 2021-08-20 NOTE — Progress Notes (Signed)
Patient ID: Gregory Tanner, male   DOB: [birth year], 66 y.o.   MRN: [id number] S: Had another episode of SVT after walking back to his bed from the bathroom.  O:BP (!) 123/56 (BP Location: Right Arm)   Pulse 73   Temp 97.6 F (36.4 C) (Oral)   Resp (!) 25   Ht '5\' 10"'$  (1.778 m)   Wt 92.2 kg   SpO2 99%   BMI 29.16 kg/m   Intake/Output Summary (Last 24 hours) at 08/20/2021 1019 Last data filed at 08/20/2021 0748 Gross per 24 hour  Intake 480 ml  Output --  Net 480 ml   Intake/Output: I/O last 3 completed shifts: In: 240 [P.O.:240] Out: -   Intake/Output this shift:  Total I/O In: 240 [P.O.:240] Out: -  Weight change: 1.954 kg Gen: NAD CVS: tachy at 111 Resp:diminished BS at bases Abd: distended, + fluid wave Ext: 2+ edema  Recent Labs  Lab 08/19/21 0226 08/20/21 0204  NA 133* 126*  K 5.0 5.1  CL 93* 94*  CO2 24 20*  GLUCOSE 123* 110*  BUN 22 37*  CREATININE 3.04* 3.91*  ALBUMIN 3.1* 3.1*  CALCIUM 7.9* 7.9*  AST 27 23  ALT 22 17   Liver Function Tests: Recent Labs  Lab 08/19/21 0226 08/20/21 0204  AST 27 23  ALT 22 17  ALKPHOS 134* 114  BILITOT 0.5 0.7  PROT 5.2* 4.9*  ALBUMIN 3.1* 3.1*   No results for input(s): "LIPASE", "AMYLASE" in the last 168 hours. No results for input(s): "AMMONIA" in the last 168 hours. CBC: Recent Labs  Lab 08/19/21 0226 08/20/21 0204  WBC 7.7 7.3  NEUTROABS 6.1  --   HGB 11.8* 11.4*  HCT 37.0* 35.9*  MCV 99.2 96.5  PLT 149* 135*   Cardiac Enzymes: No results for input(s): "CKTOTAL", "CKMB", "CKMBINDEX", "TROPONINI" in the last 168 hours. CBG: Recent Labs  Lab 08/19/21 1146 08/19/21 2108 08/20/21 0611  GLUCAP 149* 131* 121*    Iron Studies: No results for input(s): "IRON", "TIBC", "TRANSFERRIN", "FERRITIN" in the last 72 hours. Studies/Results: CT CHEST ABDOMEN PELVIS WO CONTRAST  Result Date: 08/19/2021 CLINICAL DATA:  Tachycardia. EXAM: CT CHEST, ABDOMEN AND PELVIS WITHOUT CONTRAST TECHNIQUE: Multidetector  CT imaging of the chest, abdomen and pelvis was performed following the standard protocol without IV contrast. RADIATION DOSE REDUCTION: This exam was performed according to the departmental dose-optimization program which includes automated exposure control, adjustment of the mA and/or kV according to patient size and/or use of iterative reconstruction technique. COMPARISON:  Chest CT 12/05/2020.  Abdominal CT 09/18/2007 FINDINGS: CT CHEST FINDINGS Cardiovascular: Enlarged heart. No pericardial effusion. No acute vascular finding. Prominent size of peripheral pulmonary arteries implying pulmonary hypertension although the main pulmonary artery does not appear particularly enlarged. Dialysis catheter with tip at the upper right atrium. Mediastinum/Nodes: Negative for adenopathy or mass. Lungs/Pleura: There is no edema, consolidation, effusion, or pneumothorax. Musculoskeletal: Bridging thoracic osteophytes CT ABDOMEN PELVIS FINDINGS Hepatobiliary: Lobulation of the transplanted liver surface. No acute or focal finding.No evidence of biliary obstruction or stone. Pancreas: Unremarkable. Spleen: Chronic splenic enlargement, likely from portal hypertension. Adrenals/Urinary Tract: Negative adrenals. No hydronephrosis or stone. Isodense 19 mm nodule from the upper pole right kidney. 2.4 cm isodense nodule from the lower pole left kidney. Simple and proteinaceous cysts were noted on abdominal MRI from earlier this year noted in care everywhere. Unremarkable bladder. Stomach/Bowel: Colonic diverticulosis. No evidence of bowel obstruction or visible inflammation Vascular/Lymphatic: Atheromatous calcifications. Dilated IVC and pelvic  veins. Aneurysmal enlargement of the left common iliac artery to 3.5 cm diameter, unchanged from abdominal MRI report 04/03/2021 at [facility]. There is indistinct soft tissue density along the left pelvic sidewall measuring up to 4.4 cm, it is unclear if this is adenopathy or vessel. The density and  discrete appearance is not suggestive of hematoma. No generalized adenopathy. Reproductive:No pathologic findings. Other: Small volume ascites. Musculoskeletal: No acute finding.   L2-S1 lumbar fusion. IMPRESSION: 1. Indeterminate soft tissue density along the left pelvic sidewall which could be adenopathy or abnormal vessel. Postcontrast imaging is recommended when clinically possible. 2. No acute finding in the chest.  Chronic cardiomegaly. 3. Transplanted liver. Electronically Signed   By: Jorje Guild M.D.   On: 08/19/2021 06:02   DG Chest Port 1 View  Result Date: 08/19/2021 CLINICAL DATA:  Shortness of breath. EXAM: PORTABLE CHEST 1 VIEW COMPARISON:  07/16/2021 FINDINGS: The heart is enlarged the mediastinal contour is stable. The pulmonary vasculature is distended. Mild airspace disease is present bilaterally. A stable right internal jugular central venous catheter is noted. No effusion or pneumothorax. No acute osseous abnormality. IMPRESSION: Cardiomegaly with pulmonary vascular congestion. Mild airspace disease is noted bilaterally, possible edema or infiltrate. Electronically Signed   By: Brett Fairy M.D.   On: 08/19/2021 02:59    allopurinol  150 mg Oral Daily   [START ON 08/22/2021] bumetanide  4 mg Oral Once per day on Sun Tue Thu   Chlorhexidine Gluconate Cloth  6 each Topical Q0600   docusate sodium  100 mg Oral BID   heparin  5,000 Units Subcutaneous Q8H   insulin aspart  0-6 Units Subcutaneous TID WC   midodrine  10 mg Oral Q M,W,F,Sa-1800   mycophenolate  1,000 mg Oral BID   sodium chloride flush  3 mL Intravenous Q12H    BMET    Component Value Date/Time   NA 126 (L) 08/20/2021 0204   NA 138 12/16/2019 0829   K 5.1 08/20/2021 0204   CL 94 (L) 08/20/2021 0204   CO2 20 (L) 08/20/2021 0204   GLUCOSE 110 (H) 08/20/2021 0204   BUN 37 (H) 08/20/2021 0204   BUN 75 (H) 12/16/2019 0829   CREATININE 3.91 (H) 08/20/2021 0204   CALCIUM 7.9 (L) 08/20/2021 0204   GFRNONAA 16 (L)  08/20/2021 0204   GFRAA 41 (L) 12/16/2019 0829   CBC    Component Value Date/Time   WBC 7.3 08/20/2021 0204   RBC 3.72 (L) 08/20/2021 0204   HGB 11.4 (L) 08/20/2021 0204   HGB 16.0 07/19/2018 0813   HCT 35.9 (L) 08/20/2021 0204   HCT 45.0 07/19/2018 0813   PLT 135 (L) 08/20/2021 0204   PLT 125 (L) 07/19/2018 0813   MCV 96.5 08/20/2021 0204   MCV 92 07/19/2018 0813   MCH 30.6 08/20/2021 0204   MCHC 31.8 08/20/2021 0204   RDW 16.1 (H) 08/20/2021 0204   RDW 15.7 (H) 07/19/2018 0813   LYMPHSABS 0.5 (L) 08/19/2021 0226   MONOABS 0.8 08/19/2021 0226   EOSABS 0.1 08/19/2021 0226   BASOSABS 0.1 08/19/2021 0226    Dialysis Orders:  MWF at [place] 4hr, 400/800, EDW 84.5 (doesn't typically reach, post wt 7/5 was 88.8kg), TDC, heparin 4000 unit bolus + 2500 unit mid-run bolus - Calcitriol 0.43mg PO q HD - no ESA   Assessment/Plan: SVT: S/p adenosine x 2 on admit, HR now 100-140 range, although shoots up with movement. TSH normal range. Cardiology consult pending. Known high-output HF/chronic volume overload:  Per notes, considering IR to place PleurX for maligant ascites. Pt reports TR and R heart failure as driving factor for overload. Hx liver transplant 2013: Functioning per patient, continue IS regimen. ESRD:  Continue HD per usual MWF schedule - Given volume overload, SVT, and hypotension, will plan for HD 4 days per week to help manage volume.   Hypotension/volume: Chronic hypotension, uses midodrine pre-HD, may need to consider daily use. Chronic anasarca. Tells me pulls 5-5.5L off q HD, drinks WAY too much fluid - discussed.  Anemia: Hgb 11.8, no ESA needed.  Metabolic bone disease: Ca ok, Phos pending.  Nutrition:  Alb slightly low.  Donetta Potts, MD [facility]

## 2021-08-20 NOTE — Progress Notes (Signed)
MD notified that pt states he wants all restrictions taken off on his diet but he will agree to the fluid restriction.  He states if not, he will just go down to the kitchen.

## 2021-08-20 NOTE — TOC Progression Note (Signed)
Transition of Care [facility]) - Progression Note    Patient Details  Name: Gregory Tanner MRN: [id number] Date of Birth: Sep 16, 1955  Transition of [facility]) CM/SW Contact  Zenon Mayo, RN Phone Number: 08/20/2021, 3:25 PM  Clinical Narrative:    from home with wife, SVT over 200's, HD MWF, s/p paracentesis, for cirrhosis. TOC following.         Expected Discharge Plan and Services                                                 Social Determinants of Health (SDOH) Interventions    Readmission Risk Interventions    06/03/2021    9:04 AM 12/03/2020    1:43 PM  Readmission Risk Prevention Plan  Transportation Screening Complete Complete  PCP or Specialist Appt within 3-5 Days  Complete  HRI or [place]  Complete  Social Work Consult for [place] Planning/Counseling  Complete  Palliative Care Screening  Not Applicable  Medication Review Press photographer) Complete Complete  PCP or Specialist appointment within 3-5 days of discharge Complete   HRI or [place] Complete   SW Recovery Care/Counseling Consult Complete   Major Not Applicable

## 2021-08-20 NOTE — Progress Notes (Signed)
Mobility Specialist Progress Note:   08/20/21 1127  Mobility  Activity Ambulated with assistance to bathroom  Level of Assistance Standby assist, set-up cues, supervision of patient - no hands on  Assistive Device None  Distance Ambulated (ft) 30 ft  Activity Response Tolerated well  $Mobility charge 1 Mobility   Pt received in chair asking to use restroom. No complaints of pain. Upon going into bathroom patients HR went up to 150 bpm. Left in bathroom and was instructed to pull string when finished.   [facility] Blayde Bacigalupi Mobility Specialist

## 2021-08-20 NOTE — Progress Notes (Signed)
HR noted elevated up to 160's and sustained in 140's. Pt noted eating breakfast.  Denies CP or discomfort with mild SOB.   Vital signs.  Returned to baseline with HR 110 EKG pending.

## 2021-08-21 DIAGNOSIS — I071 Rheumatic tricuspid insufficiency: Secondary | ICD-10-CM | POA: Diagnosis present

## 2021-08-21 DIAGNOSIS — D631 Anemia in chronic kidney disease: Secondary | ICD-10-CM | POA: Diagnosis present

## 2021-08-21 DIAGNOSIS — E11649 Type 2 diabetes mellitus with hypoglycemia without coma: Secondary | ICD-10-CM | POA: Diagnosis not present

## 2021-08-21 DIAGNOSIS — I4892 Unspecified atrial flutter: Secondary | ICD-10-CM | POA: Diagnosis present

## 2021-08-21 DIAGNOSIS — I2781 Cor pulmonale (chronic): Secondary | ICD-10-CM | POA: Diagnosis present

## 2021-08-21 DIAGNOSIS — R0602 Shortness of breath: Secondary | ICD-10-CM | POA: Diagnosis present

## 2021-08-21 DIAGNOSIS — I953 Hypotension of hemodialysis: Secondary | ICD-10-CM | POA: Diagnosis present

## 2021-08-21 DIAGNOSIS — D696 Thrombocytopenia, unspecified: Secondary | ICD-10-CM | POA: Diagnosis present

## 2021-08-21 DIAGNOSIS — E8889 Other specified metabolic disorders: Secondary | ICD-10-CM | POA: Diagnosis present

## 2021-08-21 DIAGNOSIS — I5043 Acute on chronic combined systolic (congestive) and diastolic (congestive) heart failure: Secondary | ICD-10-CM | POA: Diagnosis present

## 2021-08-21 DIAGNOSIS — I483 Typical atrial flutter: Secondary | ICD-10-CM | POA: Diagnosis not present

## 2021-08-21 DIAGNOSIS — Z944 Liver transplant status: Secondary | ICD-10-CM | POA: Diagnosis not present

## 2021-08-21 DIAGNOSIS — E1122 Type 2 diabetes mellitus with diabetic chronic kidney disease: Secondary | ICD-10-CM | POA: Diagnosis present

## 2021-08-21 DIAGNOSIS — I9589 Other hypotension: Secondary | ICD-10-CM | POA: Diagnosis present

## 2021-08-21 DIAGNOSIS — E871 Hypo-osmolality and hyponatremia: Secondary | ICD-10-CM | POA: Diagnosis present

## 2021-08-21 DIAGNOSIS — I132 Hypertensive heart and chronic kidney disease with heart failure and with stage 5 chronic kidney disease, or end stage renal disease: Secondary | ICD-10-CM | POA: Diagnosis present

## 2021-08-21 DIAGNOSIS — Z8505 Personal history of malignant neoplasm of liver: Secondary | ICD-10-CM | POA: Diagnosis not present

## 2021-08-21 DIAGNOSIS — R188 Other ascites: Secondary | ICD-10-CM | POA: Diagnosis present

## 2021-08-21 DIAGNOSIS — I50812 Chronic right heart failure: Secondary | ICD-10-CM | POA: Diagnosis not present

## 2021-08-21 DIAGNOSIS — I5033 Acute on chronic diastolic (congestive) heart failure: Secondary | ICD-10-CM | POA: Diagnosis not present

## 2021-08-21 DIAGNOSIS — R008 Other abnormalities of heart beat: Secondary | ICD-10-CM | POA: Diagnosis present

## 2021-08-21 DIAGNOSIS — Z992 Dependence on renal dialysis: Secondary | ICD-10-CM | POA: Diagnosis not present

## 2021-08-21 DIAGNOSIS — E785 Hyperlipidemia, unspecified: Secondary | ICD-10-CM | POA: Diagnosis present

## 2021-08-21 DIAGNOSIS — N2581 Secondary hyperparathyroidism of renal origin: Secondary | ICD-10-CM | POA: Diagnosis present

## 2021-08-21 DIAGNOSIS — I272 Pulmonary hypertension, unspecified: Secondary | ICD-10-CM | POA: Diagnosis present

## 2021-08-21 DIAGNOSIS — N186 End stage renal disease: Secondary | ICD-10-CM | POA: Diagnosis present

## 2021-08-21 DIAGNOSIS — I471 Supraventricular tachycardia: Secondary | ICD-10-CM | POA: Diagnosis present

## 2021-08-21 DIAGNOSIS — E8770 Fluid overload, unspecified: Secondary | ICD-10-CM | POA: Diagnosis not present

## 2021-08-21 DIAGNOSIS — K7290 Hepatic failure, unspecified without coma: Secondary | ICD-10-CM | POA: Diagnosis present

## 2021-08-21 DIAGNOSIS — K7581 Nonalcoholic steatohepatitis (NASH): Secondary | ICD-10-CM | POA: Diagnosis present

## 2021-08-21 LAB — GLUCOSE, CAPILLARY
Glucose-Capillary: 131 mg/dL — ABNORMAL HIGH (ref 70–99)
Glucose-Capillary: 114 mg/dL — ABNORMAL HIGH (ref 70–99)
Glucose-Capillary: 149 mg/dL — ABNORMAL HIGH (ref 70–99)
Glucose-Capillary: 109 mg/dL — ABNORMAL HIGH (ref 70–99)

## 2021-08-21 LAB — BASIC METABOLIC PANEL
Anion gap: 15 (ref 5–15)
BUN: 23 mg/dL (ref 8–23)
CO2: 24 mmol/L (ref 22–32)
Calcium: 7.8 mg/dL — ABNORMAL LOW (ref 8.9–10.3)
Chloride: 91 mmol/L — ABNORMAL LOW (ref 98–111)
Creatinine, Ser: 2.93 mg/dL — ABNORMAL HIGH (ref 0.61–1.24)
GFR, Estimated: 23 mL/min — ABNORMAL LOW (ref >60)
Glucose, Bld: 204 mg/dL — ABNORMAL HIGH (ref 70–99)
Potassium: 3.8 mmol/L (ref 3.5–5.1)
Sodium: 130 mmol/L — ABNORMAL LOW (ref 135–145)

## 2021-08-21 LAB — CBC
HCT: 34.3 % — ABNORMAL LOW (ref 39.0–52.0)
Hemoglobin: 11.2 g/dL — ABNORMAL LOW (ref 13.0–17.0)
MCH: 30.9 pg (ref 26.0–34.0)
MCHC: 32.7 g/dL (ref 30.0–36.0)
MCV: 94.5 fL (ref 80.0–100.0)
Platelets: 123 10*3/uL — ABNORMAL LOW (ref 150–400)
RBC: 3.63 MIL/uL — ABNORMAL LOW (ref 4.22–5.81)
RDW: 15.9 % — ABNORMAL HIGH (ref 11.5–15.5)
WBC: 7 10*3/uL (ref 4.0–10.5)
nRBC: 0 % (ref 0.0–0.2)

## 2021-08-21 MED ORDER — HEPARIN SOD (PORK) LOCK FLUSH 100 UNIT/ML IV SOLN
500.0000 [IU] | Freq: Once | INTRAVENOUS | Status: DC
  Filled 2021-08-21: qty 5

## 2021-08-21 MED ORDER — HEPARIN SODIUM (PORCINE) 1000 UNIT/ML IJ SOLN
INTRAMUSCULAR | Status: AC
  Administered 2021-08-21: 4000 [IU]
  Filled 2021-08-21: qty 4

## 2021-08-21 MED ORDER — ALBUMIN HUMAN 25 % IV SOLN
INTRAVENOUS | Status: AC
  Administered 2021-08-21: 25 g via INTRAVENOUS
  Filled 2021-08-21: qty 100

## 2021-08-21 MED ORDER — ALBUMIN HUMAN 25 % IV SOLN: 25.0000 g | Freq: Once | INTRAVENOUS | Status: AC

## 2021-08-21 NOTE — Progress Notes (Signed)
Patient ID: Gregory Tanner, male   DOB: [birth year], 66 y.o.   MRN: [id number] S: no new complaints and willing to go for another HD session today, however he is concerned that he will require another paracentesis by tomorrow. O:BP 98/63 (BP Location: Right Arm)   Pulse (!) 116   Temp (!) 97.4 F (36.3 C) (Oral)   Resp 20   Ht '5\' 10"'$  (1.778 m)   Wt 89.7 kg   SpO2 99%   BMI 28.37 kg/m   Intake/Output Summary (Last 24 hours) at 08/21/2021 0941 Last data filed at 08/21/2021 0920 Gross per 24 hour  Intake 740 ml  Output 4000 ml  Net -3260 ml   Intake/Output: I/O last 3 completed shifts: In: 58 [P.O.:660] Out: 4000 [Other:4000]  Intake/Output this shift:  Total I/O In: 560 [P.O.:560] Out: -  Weight change: 0.246 kg KCM:[id number] ill-appearing, NAD CVS: tachy at 116 Resp: decreased BS at R base otherwise CTA Abd: distended, + fluid wave, nontender Ext: 3+ edema to thighs bilaterally, LAVF +T/B  Recent Labs  Lab 08/19/21 0226 08/20/21 0204 08/21/21 0133  NA 133* 126* 130*  K 5.0 5.1 3.8  CL 93* 94* 91*  CO2 24 20* 24  GLUCOSE 123* 110* 204*  BUN 22 37* 23  CREATININE 3.04* 3.91* 2.93*  ALBUMIN 3.1* 3.1*  --   CALCIUM 7.9* 7.9* 7.8*  AST 27 23  --   ALT 22 17  --    Liver Function Tests: Recent Labs  Lab 08/19/21 0226 08/20/21 0204  AST 27 23  ALT 22 17  ALKPHOS 134* 114  BILITOT 0.5 0.7  PROT 5.2* 4.9*  ALBUMIN 3.1* 3.1*   No results for input(s): "LIPASE", "AMYLASE" in the last 168 hours. No results for input(s): "AMMONIA" in the last 168 hours. CBC: Recent Labs  Lab 08/19/21 0226 08/20/21 0204 08/21/21 0133  WBC 7.7 7.3 7.0  NEUTROABS 6.1  --   --   HGB 11.8* 11.4* 11.2*  HCT 37.0* 35.9* 34.3*  MCV 99.2 96.5 94.5  PLT 149* 135* 123*   Cardiac Enzymes: No results for input(s): "CKTOTAL", "CKMB", "CKMBINDEX", "TROPONINI" in the last 168 hours. CBG: Recent Labs  Lab 08/20/21 0611 08/20/21 1109 08/20/21 1552 08/20/21 2202 08/21/21 0620   GLUCAP 121* 123* 98 131* 114*    Iron Studies: No results for input(s): "IRON", "TIBC", "TRANSFERRIN", "FERRITIN" in the last 72 hours. Studies/Results: No results found.  allopurinol  150 mg Oral Daily   amiodarone  400 mg Oral BID   [START ON 08/22/2021] bumetanide  4 mg Oral Once per day on Sun Tue Thu   Chlorhexidine Gluconate Cloth  6 each Topical Q0600   docusate sodium  100 mg Oral BID   heparin  5,000 Units Subcutaneous Q8H   insulin aspart  0-6 Units Subcutaneous TID WC   midodrine  10 mg Oral 2 times per day on Mon Wed Fri Sat   mycophenolate  1,000 mg Oral BID   sodium chloride flush  3 mL Intravenous Q12H    BMET    Component Value Date/Time   NA 130 (L) 08/21/2021 0133   NA 138 12/16/2019 0829   K 3.8 08/21/2021 0133   CL 91 (L) 08/21/2021 0133   CO2 24 08/21/2021 0133   GLUCOSE 204 (H) 08/21/2021 0133   BUN 23 08/21/2021 0133   BUN 75 (H) 12/16/2019 0829   CREATININE 2.93 (H) 08/21/2021 0133   CALCIUM 7.8 (L) 08/21/2021 0133   GFRNONAA 23 (  L) 08/21/2021 0133   GFRAA 41 (L) 12/16/2019 0829   CBC    Component Value Date/Time   WBC 7.0 08/21/2021 0133   RBC 3.63 (L) 08/21/2021 0133   HGB 11.2 (L) 08/21/2021 0133   HGB 16.0 07/19/2018 0813   HCT 34.3 (L) 08/21/2021 0133   HCT 45.0 07/19/2018 0813   PLT 123 (L) 08/21/2021 0133   PLT 125 (L) 07/19/2018 0813   MCV 94.5 08/21/2021 0133   MCV 92 07/19/2018 0813   MCH 30.9 08/21/2021 0133   MCHC 32.7 08/21/2021 0133   RDW 15.9 (H) 08/21/2021 0133   RDW 15.7 (H) 07/19/2018 0813   LYMPHSABS 0.5 (L) 08/19/2021 0226   MONOABS 0.8 08/19/2021 0226   EOSABS 0.1 08/19/2021 0226   BASOSABS 0.1 08/19/2021 0226   Dialysis Orders:  MWF at [place] 4hr, 400/800, EDW 84.5 (doesn't typically reach, post wt 7/5 was 88.8kg), TDC, heparin 4000 unit bolus + 2500 unit mid-run bolus - Calcitriol 0.25mg PO q HD - no ESA   Assessment/Plan: SVT: S/p adenosine x 2 on admit, HR now 100-140 range, although shoots up with  movement. TSH normal range. Cardiology feels he is in A flutter with 2:1 and started on po amiodarone. Known high-output HF/chronic volume overload: Per notes, considering IR to place PleurX for maligant ascites. Pt reports TR and R heart failure as driving factor for overload. Hx liver transplant 2013: Functioning per patient, continue IS regimen. ESRD:  Continue HD per usual MWF schedule - Given volume overload, SVT, and hypotension, will plan for HD 4 days per week to help manage volume.   Hypotension/volume: Chronic hypotension, uses midodrine pre-HD, may need to consider daily use. Chronic anasarca. Tells me pulls 5-5.5L off q HD, drinks WAY too much fluid - discussed.  Ascites: will likely require another paracentesis.  Anemia: Hgb 11.8, no ESA needed.  Metabolic bone disease: Ca ok, Phos pending.  Nutrition:  Alb slightly low   Disposition : poor overall prognosis and agree with palliative care consult.   JDonetta Potts MD [facility]

## 2021-08-21 NOTE — Progress Notes (Signed)
PROGRESS NOTE  Gregory Tanner  ION:[id number] DOB: 1955-03-15 DOA: 08/19/2021 PCP: Ronita Hipps, MD   Brief Narrative:  Patient is a 66 year old male with history of hypertension, chronic diastolic CHF, chronic pain syndrome, ESRD on dialysis on Monday, Wednesday, Friday, diabetes type 2, NASH cirrhosis with hepatocellular carcinoma status post liver transplant presented with rapid heart rate.  On presentation his heart rate was in the range of 200s.  He was complaining of shortness of breath, substernal chest pain.  EKG rhythm showed SVT, given adenosine and he  converted to normal sinus rhythm. patient was admitted for further management.  Cardiology, nephrology following.   Assessment & Plan:  Principal Problem:   SVT (supraventricular tachycardia) (HCC) Active Problems:   Type 2 diabetes mellitus ([place])   History of liver transplant ([place])   Goals of care, counseling/discussion   Acute on chronic diastolic CHF (congestive heart failure) (HCC)   Hypervolemia associated with renal insufficiency   ESRD on dialysis ([place])   Hypotension   SVT/Aflutter : Presented with rapid heart rate.  Heart rate in the range of 200s on presentation.  Was complaining of chest pain, dyspnea.  Given adenosine in the emergency department, converted to normal sinus rhythm.  But now as per cardiology, it could be a flutter though EKG monitor shows sinus tachycardia.  Cannot tolerate AV nodal agents because of hypotension.  Not a candidate for anticoagulation. Monitor on telemetry.  Cardiology started on amiodarone.  Volume overload/ESRD on dialysis: Dialyzed on Monday, Wednesday, Friday.  Nephrology following, dialysis as scheduled.  Has dialysis catheter on the right chest  Acute on chronic diastolic CHF: Associated with cardiorenal syndrome with renal disease.  Has ascites, generalized anasarca, lower extremity edema.  Volume management as per dialysis.  Continue bumetanide. Wound care consulted for  bilateral lower extremity ulcers associated with edema.  Hyponatremia: This is most likely hypervolemic hyponatremia secondary to volume overload.  Volume management as per dialysis  Liver cirrhosis/history of hepatocellular carcinoma/ascites: Gets regular paracentesis every 3 days.  Radiology consulted here and he underwent paracentesis.  History of liver transplantation: On CellCept.  IR also consulted for possible Pleurx catheter placement but as per IR he is not a candidate .  He follows with [place] hepatology. Has chronic thrombocytopenia.  Requesting another paracentesis today.  Chronic hypotension: On midodrine  Diabetes type 2: Recent hemoglobin A1c of 5.2, indicating good control.  Continue sliding scale insulin  Hyperlipidemia: Not on medications  Goals of care: Has multiorgan system dysfunction.  Palliative care consulted.  Remains full code for now.  We recommend hospice/ palliative approach               DVT prophylaxis:heparin injection 5,000 Units Start: 08/19/21 1400     Code Status: Full Code  Family Communication: Discussed with wife at bedside on 7/7  Patient status:Inpatient  Patient is from :Home  Anticipated discharge KG:MWNU  Estimated DC date: 2 to 3 days.  Needs rate control before discharge  Consultants: Cardiology, cardiology, nephrology  Procedures: Dialysis, paracentesis  Antimicrobials:  Anti-infectives (From admission, onward)    None       Subjective: Patient seen and examined at the bedside this morning.  Hemodynamically stable.  EKG monitor shows sinus tachycardia but as per cardiology its A flutter with 2 :1 conduction.  Patient remains asymptomatic, comfortable.  Requesting paracentesis.  Objective: Vitals:   08/21/21 0146 08/21/21 0623 08/21/21 0627 08/21/21 0726  BP: (!) 98/53  99/63 98/63  Pulse:    Marland Kitchen)  116  Resp: '18 19 18 20  '$ Temp: 98.5 F (36.9 C)   (!) 97.4 F (36.3 C)  TempSrc: Oral   Oral  SpO2: 100%  99% 99%   Weight:  89.7 kg    Height:        Intake/Output Summary (Last 24 hours) at 08/21/2021 1142 Last data filed at 08/21/2021 0920 Gross per 24 hour  Intake 740 ml  Output 4000 ml  Net -3260 ml   Filed Weights   08/20/21 1640 08/20/21 2200 08/21/21 6283  Weight: 91.6 kg 86.5 kg 89.7 kg    Examination:   General exam: Overall comfortable, not in distress, chronically looking, weak, deconditioned HEENT: PERRL Respiratory system:  no wheezes or crackles  Cardiovascular system: A flutter Gastrointestinal system: Abdomen is distended, soft and nontender.  Ascites Central nervous system: Alert and oriented Extremities: Bilateral lower extremity pitting edema, TED hose  skin: No rashes, shallow ulcers on bilateral legs ,no icterus     Data Reviewed: I have personally reviewed following labs and imaging studies  CBC: Recent Labs  Lab 08/19/21 0226 08/20/21 0204 08/21/21 0133  WBC 7.7 7.3 7.0  NEUTROABS 6.1  --   --   HGB 11.8* 11.4* 11.2*  HCT 37.0* 35.9* 34.3*  MCV 99.2 96.5 94.5  PLT 149* 135* 662*   Basic Metabolic Panel: Recent Labs  Lab 08/19/21 0226 08/20/21 0204 08/21/21 0133  NA 133* 126* 130*  K 5.0 5.1 3.8  CL 93* 94* 91*  CO2 24 20* 24  GLUCOSE 123* 110* 204*  BUN 22 37* 23  CREATININE 3.04* 3.91* 2.93*  CALCIUM 7.9* 7.9* 7.8*  MG 2.0  --   --      No results found for this or any previous visit (from the past 240 hour(s)).   Radiology Studies: No results found.  Scheduled Meds:  allopurinol  150 mg Oral Daily   amiodarone  400 mg Oral BID   [START ON 08/22/2021] bumetanide  4 mg Oral Once per day on Sun Tue Thu   Chlorhexidine Gluconate Cloth  6 each Topical Q0600   docusate sodium  100 mg Oral BID   heparin  5,000 Units Subcutaneous Q8H   insulin aspart  0-6 Units Subcutaneous TID WC   midodrine  10 mg Oral 2 times per day on Mon Wed Fri Sat   mycophenolate  1,000 mg Oral BID   sodium chloride flush  3 mL Intravenous Q12H   Continuous  Infusions:   LOS: 0 days   Shelly Coss, MD [facility] P7/09/2021, 11:42 AM

## 2021-08-21 NOTE — Progress Notes (Signed)
Progress Note  Patient Name: Gregory Tanner Date of Encounter: 08/21/2021  [facility] Cardiologist: Shirlee More, MD   Subjective   Previously 2 days ago had 2 spikes of tachycardia.  He was eating during the first spell and urinating during the second.  He has to sit and rest before it resolved.  In review of telemetry currently, he is in a 2-1 flutter pattern at 113 bpm.  No significant tachycardias higher than that.  Inpatient Medications    Scheduled Meds:  allopurinol  150 mg Oral Daily   amiodarone  400 mg Oral BID   [START ON 08/22/2021] bumetanide  4 mg Oral Once per day on Sun Tue Thu   Chlorhexidine Gluconate Cloth  6 each Topical Q0600   docusate sodium  100 mg Oral BID   heparin  5,000 Units Subcutaneous Q8H   insulin aspart  0-6 Units Subcutaneous TID WC   midodrine  10 mg Oral 2 times per day on Mon Wed Fri Sat   mycophenolate  1,000 mg Oral BID   sodium chloride flush  3 mL Intravenous Q12H   Continuous Infusions:  PRN Meds: acetaminophen **OR** acetaminophen, bisacodyl, cetaphil, hydrALAZINE, ondansetron **OR** ondansetron (ZOFRAN) IV, oxyCODONE, polyethylene glycol   Vital Signs    Vitals:   08/21/21 0146 08/21/21 0623 08/21/21 0627 08/21/21 0726  BP: (!) 98/53  99/63 98/63  Pulse:    (!) 116  Resp: '18 19 18 20  '$ Temp: 98.5 F (36.9 C)   (!) 97.4 F (36.3 C)  TempSrc: Oral   Oral  SpO2: 100%  99% 99%  Weight:  89.7 kg    Height:        Intake/Output Summary (Last 24 hours) at 08/21/2021 0859 Last data filed at 08/20/2021 2122 Gross per 24 hour  Intake 180 ml  Output 4000 ml  Net -3820 ml      08/21/2021    6:23 AM 08/20/2021   10:00 PM 08/20/2021    4:40 PM  Last 3 Weights  Weight (lbs) 197 lb 11.2 oz 190 lb 11.2 oz 201 lb 15.1 oz  Weight (kg) 89.676 kg 86.5 kg 91.6 kg      Telemetry    2-1 atrial flutter pattern- Personally Reviewed  ECG    Noted- Personally Reviewed  Physical Exam   GEN: Mild distress.   Neck: No JVD, tunneled  dialysis catheter chest wall Cardiac: Mildly tachycardic regular, no murmurs, rubs, or gallops.  Respiratory: Clear to auscultation bilaterally. GI: Highly protuberant, ascites MS: No edema; No deformity. Neuro:  Nonfocal  Psych: Normal affect   Labs    High Sensitivity Troponin:  No results for input(s): "TROPONINIHS" in the last 720 hours.   Chemistry Recent Labs  Lab 08/19/21 0226 08/20/21 0204 08/21/21 0133  NA 133* 126* 130*  K 5.0 5.1 3.8  CL 93* 94* 91*  CO2 24 20* 24  GLUCOSE 123* 110* 204*  BUN 22 37* 23  CREATININE 3.04* 3.91* 2.93*  CALCIUM 7.9* 7.9* 7.8*  MG 2.0  --   --   PROT 5.2* 4.9*  --   ALBUMIN 3.1* 3.1*  --   AST 27 23  --   ALT 22 17  --   ALKPHOS 134* 114  --   BILITOT 0.5 0.7  --   GFRNONAA 22* 16* 23*  ANIONGAP 16* 12 15    Lipids No results for input(s): "CHOL", "TRIG", "HDL", "LABVLDL", "LDLCALC", "CHOLHDL" in the last 168 hours.  Hematology Recent  Labs  Lab 08/19/21 0226 08/20/21 0204 08/21/21 0133  WBC 7.7 7.3 7.0  RBC 3.73* 3.72* 3.63*  HGB 11.8* 11.4* 11.2*  HCT 37.0* 35.9* 34.3*  MCV 99.2 96.5 94.5  MCH 31.6 30.6 30.9  MCHC 31.9 31.8 32.7  RDW 16.2* 16.1* 15.9*  PLT 149* 135* 123*   Thyroid  Recent Labs  Lab 08/19/21 0611  TSH 2.262  FREET4 0.79    BNP Recent Labs  Lab 08/19/21 0226  BNP 1,348.5*    DDimer No results for input(s): "DDIMER" in the last 168 hours.   Radiology    No results found.  Cardiac Studies   Echo EF 60%.  Elevated right-sided pressures.  Patient Profile     66 y.o. male with end-stage renal disease, severe pulmonary hypertension, chronic volume overload/ascites, prior liver transplant, paroxysmal atrial flutter  Assessment & Plan    Persistent atrial flutter - Originally heart rates were 210 with EMS.  Adenosine-heart rate into the 140s - Does not tolerate AV nodal blocking agents secondary to hypotension with hemodialysis. - Not a candidate for anticoagulation.  Previously long  discussion, goals of care. - Amiodarone 400 twice daily utilized currently because of limited options. - If rapid heart rates/flutter occurs during hemodialysis, we can always utilize an IV bolus of amiodarone 150.  History of liver transplant/severe pulmonary pretension/high-output heart failure/end-stage renal disease on hemodialysis/ascites - Evaluated at [facility], typically followed there.  Recently given a 1 year prognosis.  Multisystem organ failure. - Home blood pressure medications on hold to allow for dialysis. -Nephrology on board. -Has been receiving very frequent paracenteses.  He was inquiring about potential for pigtail catheter.  Nephrology team, hospitalist team and myself were present.  Discussed the potential risks of infection.    For questions or updates, please contact [place] Please consult [email or web address] for contact info under        Signed, Candee Furbish, MD  08/21/2021, 8:59 AM

## 2021-08-22 ENCOUNTER — Inpatient Hospital Stay (HOSPITAL_COMMUNITY): Payer: Medicare Other

## 2021-08-22 DIAGNOSIS — I471 Supraventricular tachycardia: Secondary | ICD-10-CM | POA: Diagnosis not present

## 2021-08-22 LAB — BASIC METABOLIC PANEL
Anion gap: 15 (ref 5–15)
BUN: 19 mg/dL (ref 8–23)
CO2: 24 mmol/L (ref 22–32)
Calcium: 7.7 mg/dL — ABNORMAL LOW (ref 8.9–10.3)
Chloride: 90 mmol/L — ABNORMAL LOW (ref 98–111)
Creatinine, Ser: 3.07 mg/dL — ABNORMAL HIGH (ref 0.61–1.24)
GFR, Estimated: 21 mL/min — ABNORMAL LOW (ref >60)
Glucose, Bld: 192 mg/dL — ABNORMAL HIGH (ref 70–99)
Potassium: 3.6 mmol/L (ref 3.5–5.1)
Sodium: 129 mmol/L — ABNORMAL LOW (ref 135–145)

## 2021-08-22 LAB — GLUCOSE, CAPILLARY
Glucose-Capillary: 121 mg/dL — ABNORMAL HIGH (ref 70–99)
Glucose-Capillary: 123 mg/dL — ABNORMAL HIGH (ref 70–99)
Glucose-Capillary: 98 mg/dL (ref 70–99)
Glucose-Capillary: 103 mg/dL — ABNORMAL HIGH (ref 70–99)
Glucose-Capillary: 142 mg/dL — ABNORMAL HIGH (ref 70–99)

## 2021-08-22 MED ORDER — LIDOCAINE HCL (PF) 1 % IJ SOLN
INTRAMUSCULAR | Status: AC
  Filled 2021-08-22: qty 30

## 2021-08-22 NOTE — Progress Notes (Signed)
PROGRESS NOTE  Gregory Tanner  NAT:[id number] DOB: 01/23/1956 DOA: 08/19/2021 PCP: Ronita Hipps, MD   Brief Narrative:  Patient is a 66 year old male with history of hypertension, chronic diastolic CHF, severe right heart failure, pulmonary hypertension, severe TR, recurrent ascites chronic pain syndrome, ESRD on dialysis on Monday, Wednesday, Friday, diabetes type 2, NASH cirrhosis with hepatocellular carcinoma status post liver transplant presented with rapid heart rate, SVT and volume overload.  On presentation his heart rate was in the range of 200s.  She given adenosine and he  converted to normal sinus rhythm. patient was admitted for further management.  Cardiology, nephrology following.   Assessment & Plan:  SVT/Aflutter :  Heart rate in the range of 200s on presentation, Given adenosine in the emergency department, converted to normal sinus rhythm.  -Cards following, started on amiodarone  Volume overload/ESRD on dialysis: Dialyzed on Monday, Wednesday, Friday.  Nephrology following, dialysis as scheduled.   -Remains profoundly volume overloaded, next HD tomorrow  Acute on chronic systolic and diastolic CHF Severe right heart failure, severe pulmonary hypertension Severe TR Recurrent ascites -Now ESRD and dialysis dependent , also on bumetanide  -Continue HD as tolerated, midodrine for hypotension  -Overall prognosis is very poor, patient understands this was told by [facility] advanced heart failure team that his prognosis is less than 6 months, declines hospice services as he wishes to continue HD   Hyponatremia:  -Secondary to hypervolemia and bumetanide, monitor with dialysis  Liver cirrhosis/history of hepatocellular carcinoma/ Refractory ascites: Gets regular paracentesis every 3 days.  Radiology consulted here and he underwent paracentesis 4 days ago.  History of liver transplantation: On CellCept.  IR also consulted for possible Pleurx catheter placement but as per IR he  is not a candidate until he is on hospice.  He follows with [place] hepatology. Has chronic thrombocytopenia.  Repeat paracentesis today  Chronic hypotension: On midodrine  Diabetes type 2: Recent hemoglobin A1c of 5.2, indicating good control.  Continue sliding scale insulin  Hyperlipidemia: Not on medications  Goals of care: Overall poor prognosis, followed by advanced heart failure and nephrology at [facility], has been told his prognosis is 6 months or less.  He refuses hospice as he does not wish to stop dialysis yet and outpatient palliative has little to offer in this setting    DVT prophylaxis:heparin injection 5,000 Units Start: 08/19/21 1400     Code Status: Full Code  Family Communication: Discussed with patient detail, no family at bedside Anticipate discharge home in 2 to 3 days  Consultants: Cardiology, cardiology, nephrology  Procedures: Dialysis, paracentesis  Antimicrobials:  Anti-infectives (From admission, onward)    None       Subjective: -Awaiting paracentesis, feels bloated and distended  Objective: Vitals:   08/22/21 0919 08/22/21 0925 08/22/21 0935 08/22/21 1138  BP: 113/66 129/75 131/72 125/71  Pulse:    (!) 108  Resp:    20  Temp:      TempSrc:      SpO2:    100%  Weight:      Height:        Intake/Output Summary (Last 24 hours) at 08/22/2021 1416 Last data filed at 08/22/2021 1326 Gross per 24 hour  Intake 420 ml  Output --  Net 420 ml   Filed Weights   08/21/21 1434 08/21/21 1812 08/22/21 0459  Weight: 89.2 kg 87.4 kg 90.2 kg    Examination:   General exam: Pleasant chronically ill male sitting up in recliner, AAOx3 HEENT: Positive  JVD CVS: S1-S2, regular rhythm Lungs: Decreased breath sounds at the bases Abdomen: Firm, distended, bowel sounds decreased Extremities: 2-3+ edema, TED hose on Skin: No rashes on exposed skin   Data Reviewed: I have personally reviewed following labs and imaging studies  CBC: Recent Labs  Lab  08/19/21 0226 08/20/21 0204 08/21/21 0133  WBC 7.7 7.3 7.0  NEUTROABS 6.1  --   --   HGB 11.8* 11.4* 11.2*  HCT 37.0* 35.9* 34.3*  MCV 99.2 96.5 94.5  PLT 149* 135* 858*   Basic Metabolic Panel: Recent Labs  Lab 08/19/21 0226 08/20/21 0204 08/21/21 0133 08/22/21 0226  NA 133* 126* 130* 129*  K 5.0 5.1 3.8 3.6  CL 93* 94* 91* 90*  CO2 24 20* 24 24  GLUCOSE 123* 110* 204* 192*  BUN 22 37* 23 19  CREATININE 3.04* 3.91* 2.93* 3.07*  CALCIUM 7.9* 7.9* 7.8* 7.7*  MG 2.0  --   --   --      No results found for this or any previous visit (from the past 240 hour(s)).   Radiology Studies: No results found.  Scheduled Meds:  allopurinol  150 mg Oral Daily   amiodarone  400 mg Oral BID   bumetanide  4 mg Oral Once per day on Sun Tue Thu   Chlorhexidine Gluconate Cloth  6 each Topical Q0600   docusate sodium  100 mg Oral BID   heparin  5,000 Units Subcutaneous Q8H   heparin lock flush  500 Units Intravenous Once   insulin aspart  0-6 Units Subcutaneous TID WC   lidocaine (PF)       midodrine  10 mg Oral 2 times per day on Mon Wed Fri Sat   mycophenolate  1,000 mg Oral BID   sodium chloride flush  3 mL Intravenous Q12H   Continuous Infusions:   LOS: 1 day   Domenic Polite, MD [facility] P7/10/2021, 2:16 PM

## 2021-08-22 NOTE — Procedures (Signed)
PROCEDURE SUMMARY:  Successful ultrasound guided paracentesis from the left lower quadrant.  Yielded 2.4 L of hazy amber fluid.  No immediate complications.  The patient tolerated the procedure well.   Specimen was not sent for labs.  EBL < 63m  The patient has required >/=2 paracenteses in a 30 day period and a screening evaluation by the [facility] has been arranged.  Per note from cardiology, patient is s/p transplant/severe pulmonary pretension/high-output heart failure/end-stage renal disease on hemodialysis/ascites, he is followed by [facility], recently given 1 year prognosis.   DRuthann Cancer MD Pager: [phone]

## 2021-08-22 NOTE — Progress Notes (Signed)
Progress Note  Patient Name: Gregory Tanner Date of Encounter: 08/22/2021  [facility] Cardiologist: Shirlee More, MD   Subjective   Previously 3 days ago had 2 spikes of tachycardia.  He was eating during the first spell and urinating during the second.  He has to sit and rest before it resolved.  In review of telemetry currently, he is in a 2-1 flutter pattern at 109 bpm.  No significant tachycardias higher than that.    Inpatient Medications    Scheduled Meds:  allopurinol  150 mg Oral Daily   amiodarone  400 mg Oral BID   bumetanide  4 mg Oral Once per day on Sun Tue Thu   Chlorhexidine Gluconate Cloth  6 each Topical Q0600   docusate sodium  100 mg Oral BID   heparin  5,000 Units Subcutaneous Q8H   heparin lock flush  500 Units Intravenous Once   insulin aspart  0-6 Units Subcutaneous TID WC   midodrine  10 mg Oral 2 times per day on Mon Wed Fri Sat   mycophenolate  1,000 mg Oral BID   sodium chloride flush  3 mL Intravenous Q12H   Continuous Infusions:  PRN Meds: acetaminophen **OR** acetaminophen, bisacodyl, cetaphil, hydrALAZINE, ondansetron **OR** ondansetron (ZOFRAN) IV, oxyCODONE, polyethylene glycol   Vital Signs    Vitals:   08/21/21 1815 08/21/21 1936 08/22/21 0459 08/22/21 0715  BP: 103/68 103/63 106/63 113/66  Pulse: (!) 106 (!) 109 (!) 109 (!) 107  Resp: (!) '22 18 18 20  '$ Temp:  98.2 F (36.8 C) 98.4 F (36.9 C) 97.6 F (36.4 C)  TempSrc:  Oral Oral Oral  SpO2: 99% 96% 99% 98%  Weight:   90.2 kg   Height:        Intake/Output Summary (Last 24 hours) at 08/22/2021 0851 Last data filed at 08/21/2021 1246 Gross per 24 hour  Intake 800 ml  Output --  Net 800 ml      08/22/2021    4:59 AM 08/21/2021    6:12 PM 08/21/2021    2:34 PM  Last 3 Weights  Weight (lbs) 198 lb 13.7 oz 192 lb 10.9 oz 196 lb 10.4 oz  Weight (kg) 90.2 kg 87.4 kg 89.2 kg      Telemetry    2-1 atrial flutter pattern- Personally Reviewed  ECG    Noted- Personally  Reviewed  Physical Exam   GEN: Mild distress.   Neck: Severe JVD, tunneled dialysis catheter Cardiac: Mildly tachycardic regular, no murmurs, rubs, or gallops.  Respiratory: Clear to auscultation bilaterally. GI: Protuberant with ascites MS: 3+ lower extremity edema; No deformity. Neuro:  Nonfocal  Psych: Normal affect   Labs    High Sensitivity Troponin:  No results for input(s): "TROPONINIHS" in the last 720 hours.   Chemistry Recent Labs  Lab 08/19/21 0226 08/20/21 0204 08/21/21 0133 08/22/21 0226  NA 133* 126* 130* 129*  K 5.0 5.1 3.8 3.6  CL 93* 94* 91* 90*  CO2 24 20* 24 24  GLUCOSE 123* 110* 204* 192*  BUN 22 37* 23 19  CREATININE 3.04* 3.91* 2.93* 3.07*  CALCIUM 7.9* 7.9* 7.8* 7.7*  MG 2.0  --   --   --   PROT 5.2* 4.9*  --   --   ALBUMIN 3.1* 3.1*  --   --   AST 27 23  --   --   ALT 22 17  --   --   ALKPHOS 134* 114  --   --  BILITOT 0.5 0.7  --   --   GFRNONAA 22* 16* 23* 21*  ANIONGAP 16* '12 15 15    '$ Lipids No results for input(s): "CHOL", "TRIG", "HDL", "LABVLDL", "LDLCALC", "CHOLHDL" in the last 168 hours.  Hematology Recent Labs  Lab 08/19/21 0226 08/20/21 0204 08/21/21 0133  WBC 7.7 7.3 7.0  RBC 3.73* 3.72* 3.63*  HGB 11.8* 11.4* 11.2*  HCT 37.0* 35.9* 34.3*  MCV 99.2 96.5 94.5  MCH 31.6 30.6 30.9  MCHC 31.9 31.8 32.7  RDW 16.2* 16.1* 15.9*  PLT 149* 135* 123*   Thyroid  Recent Labs  Lab 08/19/21 0611  TSH 2.262  FREET4 0.79    BNP Recent Labs  Lab 08/19/21 0226  BNP 1,348.5*    DDimer No results for input(s): "DDIMER" in the last 168 hours.   Radiology    No results found.  Cardiac Studies   Echo EF 60%.  Elevated right-sided pressures.  Patient Profile     66 y.o. male with end-stage renal disease, severe pulmonary hypertension, chronic volume overload/ascites, prior liver transplant, paroxysmal atrial flutter  Assessment & Plan    Persistent atrial flutter - Originally heart rates were 210 with EMS.   Adenosine-heart rate into the 140s - Does not tolerate AV nodal blocking agents secondary to hypotension with hemodialysis. - Not a candidate for anticoagulation.  Previously long discussion, goals of care. - Amiodarone 400 twice daily utilized currently because of limited options.  This is decreasing his overall flutter rate which in turn is improving his heart rate.  On discharge, continue amiodarone 400 mg twice a day for 3 more days and then transition to amiodarone 200 mg twice a day for 2 weeks and then 200 mg once a day thereafter. - If rapid heart rates/flutter occurs during hemodialysis, we can always utilize an IV bolus of amiodarone 150.  History of liver transplant/severe pulmonary pretension/high-output heart failure/end-stage renal disease on hemodialysis/ascites - Evaluated at [facility], typically followed there.  Recently given a 1 year prognosis.  Multisystem organ failure. - Home blood pressure medications on hold to allow for dialysis.  Midodrine being utilized. -Nephrology on board. -Has been receiving very frequent paracenteses.  He was inquiring about potential for pigtail catheter.  Nephrology team, hospitalist team and myself were present yesterday for discussion.  Discussed the potential risks of infection especially given antirejection drugs such as CellCept.    For questions or updates, please contact [place] Please consult [email or web address] for contact info under        Signed, Candee Furbish, MD  08/22/2021, 8:51 AM

## 2021-08-22 NOTE — Progress Notes (Signed)
Patient ID: Gregory Tanner, male   DOB: August 02, 1955, 66 y.o.   MRN: [id number] S: No complaints.  Had paracentesis this morning of 2.4 liters. O:BP 125/71 (BP Location: Right Wrist)   Pulse (!) 108   Temp 97.6 F (36.4 C) (Oral)   Resp 20   Ht '5\' 10"'$  (1.778 m)   Wt 90.2 kg   SpO2 100%   BMI 28.53 kg/m   Intake/Output Summary (Last 24 hours) at 08/22/2021 1241 Last data filed at 08/22/2021 0900 Gross per 24 hour  Intake 480 ml  Output --  Net 480 ml   Intake/Output: I/O last 3 completed shifts: In: 800 [P.O.:800] Out: 4000 [Other:4000]  Intake/Output this shift:  Total I/O In: 240 [P.O.:240] Out: -  Weight change: -2.4 kg Gen: NAD CVS: tachy Resp: decreased BS at bases Abd: distended, +BS, + fluid wave, nontender Ext:2+ anasarca  Recent Labs  Lab 08/19/21 0226 08/20/21 0204 08/21/21 0133 08/22/21 0226  NA 133* 126* 130* 129*  K 5.0 5.1 3.8 3.6  CL 93* 94* 91* 90*  CO2 24 20* 24 24  GLUCOSE 123* 110* 204* 192*  BUN 22 37* 23 19  CREATININE 3.04* 3.91* 2.93* 3.07*  ALBUMIN 3.1* 3.1*  --   --   CALCIUM 7.9* 7.9* 7.8* 7.7*  AST 27 23  --   --   ALT 22 17  --   --    Liver Function Tests: Recent Labs  Lab 08/19/21 0226 08/20/21 0204  AST 27 23  ALT 22 17  ALKPHOS 134* 114  BILITOT 0.5 0.7  PROT 5.2* 4.9*  ALBUMIN 3.1* 3.1*   No results for input(s): "LIPASE", "AMYLASE" in the last 168 hours. No results for input(s): "AMMONIA" in the last 168 hours. CBC: Recent Labs  Lab 08/19/21 0226 08/20/21 0204 08/21/21 0133  WBC 7.7 7.3 7.0  NEUTROABS 6.1  --   --   HGB 11.8* 11.4* 11.2*  HCT 37.0* 35.9* 34.3*  MCV 99.2 96.5 94.5  PLT 149* 135* 123*   Cardiac Enzymes: No results for input(s): "CKTOTAL", "CKMB", "CKMBINDEX", "TROPONINI" in the last 168 hours. CBG: Recent Labs  Lab 08/21/21 1100 08/21/21 1946 08/22/21 0645 08/22/21 0756 08/22/21 1129  GLUCAP 149* 109* 121* 123* 98    Iron Studies: No results for input(s): "IRON", "TIBC",  "TRANSFERRIN", "FERRITIN" in the last 72 hours. Studies/Results: No results found.  allopurinol  150 mg Oral Daily   amiodarone  400 mg Oral BID   bumetanide  4 mg Oral Once per day on Sun Tue Thu   Chlorhexidine Gluconate Cloth  6 each Topical Q0600   docusate sodium  100 mg Oral BID   heparin  5,000 Units Subcutaneous Q8H   heparin lock flush  500 Units Intravenous Once   insulin aspart  0-6 Units Subcutaneous TID WC   lidocaine (PF)       midodrine  10 mg Oral 2 times per day on Mon Wed Fri Sat   mycophenolate  1,000 mg Oral BID   sodium chloride flush  3 mL Intravenous Q12H    BMET    Component Value Date/Time   NA 129 (L) 08/22/2021 0226   NA 138 12/16/2019 0829   K 3.6 08/22/2021 0226   CL 90 (L) 08/22/2021 0226   CO2 24 08/22/2021 0226   GLUCOSE 192 (H) 08/22/2021 0226   BUN 19 08/22/2021 0226   BUN 75 (H) 12/16/2019 0829   CREATININE 3.07 (H) 08/22/2021 0226   CALCIUM 7.7 (  L) 08/22/2021 0226   GFRNONAA 21 (L) 08/22/2021 0226   GFRAA 41 (L) 12/16/2019 0829   CBC    Component Value Date/Time   WBC 7.0 08/21/2021 0133   RBC 3.63 (L) 08/21/2021 0133   HGB 11.2 (L) 08/21/2021 0133   HGB 16.0 07/19/2018 0813   HCT 34.3 (L) 08/21/2021 0133   HCT 45.0 07/19/2018 0813   PLT 123 (L) 08/21/2021 0133   PLT 125 (L) 07/19/2018 0813   MCV 94.5 08/21/2021 0133   MCV 92 07/19/2018 0813   MCH 30.9 08/21/2021 0133   MCHC 32.7 08/21/2021 0133   RDW 15.9 (H) 08/21/2021 0133   RDW 15.7 (H) 07/19/2018 0813   LYMPHSABS 0.5 (L) 08/19/2021 0226   MONOABS 0.8 08/19/2021 0226   EOSABS 0.1 08/19/2021 0226   BASOSABS 0.1 08/19/2021 0226    Dialysis Orders:  MWF at [place] 4hr, 400/800, EDW 84.5 (doesn't typically reach, post wt 7/5 was 88.8kg), TDC, heparin 4000 unit bolus + 2500 unit mid-run bolus - Calcitriol 0.22mg PO q HD - no ESA   Assessment/Plan: SVT: S/p adenosine x 2 on admit, HR now 100-140 range, although shoots up with movement. TSH normal range. Cardiology feels  he is in A flutter with 2:1 and started on po amiodarone. Known high-output HF/chronic volume overload: Per notes, considering IR to place PleurX for maligant ascites. Pt reports TR and R heart failure as driving factor for overload.  Recommend HD 4 days per week.  He understands that he is nearing end of life and states hospice of [place] wont be involved as long as he continues with dialysis.  Consider pigtail catheter for daily paracentesis as he is going every 2-3 days now and has to go to the hospital on the weekends.  Hx liver transplant 2013: Functioning per patient, continue IS regimen. ESRD:  Continue HD per usual MWF schedule - Given volume overload, SVT, and hypotension, will plan for HD 4 days per week to help manage volume.   Hypotension/volume: Chronic hypotension, uses midodrine pre-HD, may need to consider daily use. Chronic anasarca. Tells me pulls 5-5.5L off q HD, drinks WAY too much fluid - discussed.  Ascites: will likely require another paracentesis.  Anemia: Hgb 11.8, no ESA needed.  Metabolic bone disease: Ca ok, Phos pending.  Nutrition:  Alb slightly low   Disposition : poor overall prognosis and agree with palliative care consult.   JDonetta Potts MD [facility]

## 2021-08-23 DIAGNOSIS — I483 Typical atrial flutter: Secondary | ICD-10-CM

## 2021-08-23 DIAGNOSIS — I471 Supraventricular tachycardia: Secondary | ICD-10-CM | POA: Diagnosis not present

## 2021-08-23 LAB — RENAL FUNCTION PANEL
Albumin: 3.3 g/dL — ABNORMAL LOW (ref 3.5–5.0)
Anion gap: 16 — ABNORMAL HIGH (ref 5–15)
BUN: 42 mg/dL — ABNORMAL HIGH (ref 8–23)
CO2: 22 mmol/L (ref 22–32)
Calcium: 8.1 mg/dL — ABNORMAL LOW (ref 8.9–10.3)
Chloride: 88 mmol/L — ABNORMAL LOW (ref 98–111)
Creatinine, Ser: 5.24 mg/dL — ABNORMAL HIGH (ref 0.61–1.24)
GFR, Estimated: 11 mL/min — ABNORMAL LOW (ref >60)
Glucose, Bld: 126 mg/dL — ABNORMAL HIGH (ref 70–99)
Phosphorus: 5.5 mg/dL — ABNORMAL HIGH (ref 2.5–4.6)
Potassium: 4.8 mmol/L (ref 3.5–5.1)
Sodium: 126 mmol/L — ABNORMAL LOW (ref 135–145)

## 2021-08-23 LAB — CBC
HCT: 37.4 % — ABNORMAL LOW (ref 39.0–52.0)
Hemoglobin: 12.1 g/dL — ABNORMAL LOW (ref 13.0–17.0)
MCH: 31.4 pg (ref 26.0–34.0)
MCHC: 32.4 g/dL (ref 30.0–36.0)
MCV: 97.1 fL (ref 80.0–100.0)
Platelets: 127 10*3/uL — ABNORMAL LOW (ref 150–400)
RBC: 3.85 MIL/uL — ABNORMAL LOW (ref 4.22–5.81)
RDW: 16.2 % — ABNORMAL HIGH (ref 11.5–15.5)
WBC: 8.2 10*3/uL (ref 4.0–10.5)
nRBC: 0 % (ref 0.0–0.2)

## 2021-08-23 LAB — BASIC METABOLIC PANEL
Anion gap: 19 — ABNORMAL HIGH (ref 5–15)
BUN: 42 mg/dL — ABNORMAL HIGH (ref 8–23)
CO2: 21 mmol/L — ABNORMAL LOW (ref 22–32)
Calcium: 8.2 mg/dL — ABNORMAL LOW (ref 8.9–10.3)
Chloride: 88 mmol/L — ABNORMAL LOW (ref 98–111)
Creatinine, Ser: 5.05 mg/dL — ABNORMAL HIGH (ref 0.61–1.24)
GFR, Estimated: 12 mL/min — ABNORMAL LOW (ref >60)
Glucose, Bld: 130 mg/dL — ABNORMAL HIGH (ref 70–99)
Potassium: 4.9 mmol/L (ref 3.5–5.1)
Sodium: 128 mmol/L — ABNORMAL LOW (ref 135–145)

## 2021-08-23 LAB — GLUCOSE, CAPILLARY
Glucose-Capillary: 133 mg/dL — ABNORMAL HIGH (ref 70–99)
Glucose-Capillary: 132 mg/dL — ABNORMAL HIGH (ref 70–99)
Glucose-Capillary: 76 mg/dL (ref 70–99)
Glucose-Capillary: 89 mg/dL (ref 70–99)

## 2021-08-23 MED ORDER — ALBUMIN HUMAN 25 % IV SOLN
INTRAVENOUS | Status: AC
  Administered 2021-08-23: 12.5 g
  Filled 2021-08-23: qty 100

## 2021-08-23 MED ORDER — LIDOCAINE-PRILOCAINE 2.5-2.5 % EX CREA: 1.0000 | TOPICAL_CREAM | CUTANEOUS | Status: DC | PRN

## 2021-08-23 MED ORDER — PENTAFLUOROPROP-TETRAFLUOROETH EX AERO
1.0000 | INHALATION_SPRAY | CUTANEOUS | Status: DC | PRN
Start: 2021-08-23 — End: 2021-08-24

## 2021-08-23 MED ORDER — HEPARIN SODIUM (PORCINE) 1000 UNIT/ML IJ SOLN
INTRAMUSCULAR | Status: AC
  Administered 2021-08-23: 3.2 [IU] via ARTERIOVENOUS_FISTULA
  Filled 2021-08-23: qty 4

## 2021-08-23 MED ORDER — LIDOCAINE HCL (PF) 1 % IJ SOLN: 5.0000 mL | INTRAMUSCULAR | Status: DC | PRN

## 2021-08-23 MED ORDER — HEPARIN SODIUM (PORCINE) 1000 UNIT/ML DIALYSIS
1000.0000 [IU] | INTRAMUSCULAR | Status: DC | PRN
Start: 2021-08-23 — End: 2021-08-24
  Administered 2021-08-24: 1000 [IU] via INTRAVENOUS_CENTRAL
  Filled 2021-08-23: qty 1

## 2021-08-23 MED ORDER — ALTEPLASE 2 MG IJ SOLR: 2.0000 mg | Freq: Once | INTRAMUSCULAR | Status: DC | PRN

## 2021-08-23 MED ORDER — ALUM & MAG HYDROXIDE-SIMETH 200-200-20 MG/5ML PO SUSP
30.0000 mL | Freq: Four times a day (QID) | ORAL | Status: DC | PRN
  Administered 2021-08-23: 30 mL via ORAL
  Filled 2021-08-23: qty 30

## 2021-08-23 NOTE — Progress Notes (Addendum)
Progress Note  Patient Name: Gregory Tanner Date of Encounter: 08/23/2021  [facility] Cardiologist: Shirlee More, MD   Subjective   Seen during dialysis.  Patient reports shortness of breath.  Telemetry reveals as sinus rhythm but suspect atrial flutter, heart rate in 100s.  No chest pain.  Inpatient Medications    Scheduled Meds:  allopurinol  150 mg Oral Daily   amiodarone  400 mg Oral BID   bumetanide  4 mg Oral Once per day on Sun Tue Thu   Chlorhexidine Gluconate Cloth  6 each Topical Q0600   docusate sodium  100 mg Oral BID   heparin  5,000 Units Subcutaneous Q8H   heparin lock flush  500 Units Intravenous Once   insulin aspart  0-6 Units Subcutaneous TID WC   midodrine  10 mg Oral 2 times per day on Mon Wed Fri Sat   mycophenolate  1,000 mg Oral BID   sodium chloride flush  3 mL Intravenous Q12H   Continuous Infusions:  PRN Meds: acetaminophen **OR** acetaminophen, alum & mag hydroxide-simeth, bisacodyl, cetaphil, hydrALAZINE, ondansetron **OR** ondansetron (ZOFRAN) IV, oxyCODONE, polyethylene glycol   Vital Signs    Vitals:   08/23/21 0613 08/23/21 0755 08/23/21 0936 08/23/21 0937  BP: 121/69 131/73  121/65  Pulse: (!) 101   (!) 105  Resp: 20 20  (!) 28  Temp: 97.7 F (36.5 C)   97.7 F (36.5 C)  TempSrc: Oral     SpO2: 100%   100%  Weight: 90.1 kg  88.6 kg   Height:        Intake/Output Summary (Last 24 hours) at 08/23/2021 0951 Last data filed at 08/23/2021 0848 Gross per 24 hour  Intake 1140 ml  Output 300 ml  Net 840 ml      08/23/2021    9:36 AM 08/23/2021    6:13 AM 08/22/2021    4:59 AM  Last 3 Weights  Weight (lbs) 195 lb 5.2 oz 198 lb 9.6 oz 198 lb 13.7 oz  Weight (kg) 88.6 kg 90.084 kg 90.2 kg      Telemetry    Atrial flutter heart rate of 100, telemetry reading as sinus rhythm - Personally Reviewed  ECG    No new tracing flutter rate 110 bpm   Physical Exam   GEN: No acute distress.   Neck: + JVD Cardiac: Regular  tachycardic, no murmurs, rubs, or gallops.  Respiratory: Clear to auscultation bilaterally. GI: Soft, nontender, non-distended  MS: + edema; No deformity. Neuro:  Nonfocal  Psych: Normal affect   Labs    High Sensitivity Troponin:  No results for input(s): "TROPONINIHS" in the last 720 hours.   Chemistry Recent Labs  Lab 08/19/21 0226 08/20/21 0204 08/21/21 0133 08/22/21 0226 08/23/21 0403  NA 133* 126* 130* 129* 126*  128*  K 5.0 5.1 3.8 3.6 4.8  4.9  CL 93* 94* 91* 90* 88*  88*  CO2 24 20* '24 24 22  '$ 21*  GLUCOSE 123* 110* 204* 192* 126*  130*  BUN 22 37* 23 19 42*  42*  CREATININE 3.04* 3.91* 2.93* 3.07* 5.24*  5.05*  CALCIUM 7.9* 7.9* 7.8* 7.7* 8.1*  8.2*  MG 2.0  --   --   --   --   PROT 5.2* 4.9*  --   --   --   ALBUMIN 3.1* 3.1*  --   --  3.3*  AST 27 23  --   --   --  ALT 22 17  --   --   --   ALKPHOS 134* 114  --   --   --   BILITOT 0.5 0.7  --   --   --   GFRNONAA 22* 16* 23* 21* 11*  12*  ANIONGAP 16* '12 15 15 '$ 16*  19*    Lipids No results for input(s): "CHOL", "TRIG", "HDL", "LABVLDL", "LDLCALC", "CHOLHDL" in the last 168 hours.  Hematology Recent Labs  Lab 08/20/21 0204 08/21/21 0133 08/23/21 0403  WBC 7.3 7.0 8.2  RBC 3.72* 3.63* 3.85*  HGB 11.4* 11.2* 12.1*  HCT 35.9* 34.3* 37.4*  MCV 96.5 94.5 97.1  MCH 30.6 30.9 31.4  MCHC 31.8 32.7 32.4  RDW 16.1* 15.9* 16.2*  PLT 135* 123* 127*   Thyroid  Recent Labs  Lab 08/19/21 0611  TSH 2.262  FREET4 0.79    BNP Recent Labs  Lab 08/19/21 0226  BNP 1,348.5*    DDimer No results for input(s): "DDIMER" in the last 168 hours.   Radiology    No results found.  Cardiac Studies   N/A  Patient Profile     66 y.o. male with history of severe pulmonary hypertension, chronic volume overload/ascites, prior liver transplant, paroxysmal atrial flutter and end-stage renal disease on hemodialysis seen for a flutter with RVR.  Assessment & Plan    Atrial flutter with rapid ventricular  rate -Originally heart rate in 210, given adenosine with improved rate to 140s. -Unable to add AV nodal blocking agents secondary to hypotension with dialysis -Not a anticoagulation candidate with prior long discussion regarding goals of care -Added amiodarone due to limited option.  -Currently rhythm looks like 2-1 atrial flutter on monitor but read as sinus rhythm, heart rate in 100s -Continue p.o. amiodarone.  Plan to give bolus of amiodarone 150 if rapid rates  History of liver transplant/severe pulmonary pretension/high-output heart failure/end-stage renal disease on hemodialysis/ascites - underwent Successful ultrasound guided paracentesis from the left lower quadrant yesterday with Yielded 2.4 L of hazy amber fluid.  -Currently reporting difficulty breathing during dialysis   For questions or updates, please contact [facility] Please consult [email or web address] for contact info under        Signed, Leanor Kail, PA  08/23/2021, 9:51 AM     Patient examined in dialysis Agree rhythm is flutter Re bolus with amiodarone. AV nodal drugs limited by hypotension on midodrine to support   Jenkins Rouge MD [facility]

## 2021-08-23 NOTE — Progress Notes (Signed)
Mobility Specialist Progress Note:   08/23/21 0941  Mobility  Activity Off unit   Pt in HD. Will follow-up as time allows.   [facility] Gregory Tanner Mobility Specialist

## 2021-08-23 NOTE — Progress Notes (Signed)
Received patient in bed, alert and oriented. Informed consent signed and in chart.  Time tx completed: 6045   HD treatment completed. Patient tolerated well. Right HD catheter without signs and symptoms of complications. Dressing change due on 08/27/21 Patient transported back to the room, alert and orient and in no acute distress. Report given to bedside RN.  Total UF removed: 5L   Medication given: Midodrine '10mg'$   Albumin 25gm   Post HD VS: BP 102/66 O2 98% Resp 29 Temp 98.1   Post HD weight: 85.5kg

## 2021-08-23 NOTE — Progress Notes (Signed)
PROGRESS NOTE  Gregory Tanner  FWY:[id number] DOB: [birth year] DOA: 08/19/2021 PCP: Ronita Hipps, MD   Brief Narrative:  Patient is a 66 year old male with history of hypertension, chronic diastolic CHF, severe right heart failure, pulmonary hypertension, severe TR, recurrent ascites chronic pain syndrome, ESRD on dialysis on Monday, Wednesday, Friday, diabetes type 2, NASH cirrhosis with hepatocellular carcinoma status post liver transplant presented with rapid heart rate, SVT and volume overload.  On presentation his heart rate was in the range of 200s.  She given adenosine and he  converted to normal sinus rhythm. patient was admitted for further management.  Cardiology, nephrology following.   Assessment & Plan:  SVT/Aflutter :  Heart rate in the range of 200s on presentation, treated w/adenosine -Subsequently with atrial flutter, RVR -Cards following, started on amiodarone -Options limited by hypotension  Volume overload/ESRD on dialysis: Dialyzed on Monday, Wednesday, Friday.  Nephrology following, dialysis as scheduled.   -Remains profoundly volume overloaded, HD today  Acute on chronic systolic and diastolic CHF Severe right heart failure, severe pulmonary hypertension Severe TR Recurrent ascites -Now ESRD and dialysis dependent , also on bumetanide  -Continue HD as tolerated, midodrine for hypotension  -Overall prognosis is very poor, patient understands this was told by [facility] advanced heart failure team that his prognosis is less than 6 months, declines hospice services as he wishes to continue HD  -Per IR Pleurx catheter for recurrent ascites is not an option unless he is on hospice  Hyponatremia:  -Secondary to hypervolemia and bumetanide, monitor with dialysis  Liver cirrhosis/history of hepatocellular carcinoma/ Refractory ascites: Gets regular paracentesis every 3 days.  Radiology consulted here and he underwent paracentesis 4 days ago.  History of liver  transplantation: On CellCept.  IR also consulted for possible Pleurx catheter placement but as per IR he is not a candidate until he is on hospice.  He follows with [facility]. Has chronic thrombocytopenia.  Repeat paracentesis completed yesterday, 2.2 L drained  Chronic hypotension: On midodrine  Diabetes type 2: Recent hemoglobin A1c of 5.2, indicating good control.  Continue sliding scale insulin  Hyperlipidemia: Not on medications  Goals of care: Overall poor prognosis, followed by advanced heart failure and nephrology at [facility], has been told his prognosis is 6 months or less.  He refuses hospice as he does not wish to stop dialysis yet and outpatient palliative has little to offer in this setting    DVT prophylaxis:heparin injection 5,000 Units Start: 08/19/21 1400     Code Status: Full Code  Family Communication: Discussed with patient detail, no family at bedside Anticipate discharge home in 2 to 3 days  Consultants: Cardiology, cardiology, nephrology  Procedures: Dialysis, paracentesis  Antimicrobials:  Anti-infectives (From admission, onward)    None       Subjective: Had paracentesis yesterday, 2.2 L drained, feels like he is starting to reaccumulate, upset that Pleurx catheter is not an option for him, wishes to speak to radiology directly  Objective: Vitals:   08/23/21 0937 08/23/21 0959 08/23/21 1030 08/23/21 1145  BP: 121/65 120/70 107/63 105/63  Pulse: (!) 105 (!) 105 (!) 106 (!) 106  Resp: (!) 28 20 (!) 24 (!) 23  Temp: 97.7 F (36.5 C)     TempSrc: Oral     SpO2: 100% 100%  100%  Weight:      Height:        Intake/Output Summary (Last 24 hours) at 08/23/2021 1223 Last data filed at 08/23/2021 0848 Gross per 24 hour  Intake 1140 ml  Output 300 ml  Net 840 ml   Filed Weights   08/22/21 0459 08/23/21 0613 08/23/21 0936  Weight: 90.2 kg 90.1 kg 88.6 kg    Examination:   General exam: Chronically ill male sitting up in bed, AAOx3 HEENT:  Positive JVD CVS: S1-S2, irregularly irregular rhythm Lungs: Decreased breath sounds the bases Abdomen: Firm, less distended, bowel sounds present Extremities: 2+ edema, TED hose on  Skin: No rashes on exposed skin   Data Reviewed: I have personally reviewed following labs and imaging studies  CBC: Recent Labs  Lab 08/19/21 0226 08/20/21 0204 08/21/21 0133 08/23/21 0403  WBC 7.7 7.3 7.0 8.2  NEUTROABS 6.1  --   --   --   HGB 11.8* 11.4* 11.2* 12.1*  HCT 37.0* 35.9* 34.3* 37.4*  MCV 99.2 96.5 94.5 97.1  PLT 149* 135* 123* 017*   Basic Metabolic Panel: Recent Labs  Lab 08/19/21 0226 08/20/21 0204 08/21/21 0133 08/22/21 0226 08/23/21 0403  NA 133* 126* 130* 129* 126*  128*  K 5.0 5.1 3.8 3.6 4.8  4.9  CL 93* 94* 91* 90* 88*  88*  CO2 24 20* '24 24 22  '$ 21*  GLUCOSE 123* 110* 204* 192* 126*  130*  BUN 22 37* 23 19 42*  42*  CREATININE 3.04* 3.91* 2.93* 3.07* 5.24*  5.05*  CALCIUM 7.9* 7.9* 7.8* 7.7* 8.1*  8.2*  MG 2.0  --   --   --   --   PHOS  --   --   --   --  5.5*     No results found for this or any previous visit (from the past 240 hour(s)).   Radiology Studies: No results found.  Scheduled Meds:  allopurinol  150 mg Oral Daily   amiodarone  400 mg Oral BID   bumetanide  4 mg Oral Once per day on Sun Tue Thu   Chlorhexidine Gluconate Cloth  6 each Topical Q0600   docusate sodium  100 mg Oral BID   heparin  5,000 Units Subcutaneous Q8H   heparin lock flush  500 Units Intravenous Once   insulin aspart  0-6 Units Subcutaneous TID WC   midodrine  10 mg Oral 2 times per day on Mon Wed Fri Sat   mycophenolate  1,000 mg Oral BID   sodium chloride flush  3 mL Intravenous Q12H   Continuous Infusions:   LOS: 2 days   Domenic Polite, MD [facility] P7/11/2021, 12:23 PM

## 2021-08-23 NOTE — Progress Notes (Addendum)
[facility] Progress Note   Subjective:    Seen and examined patient on HD. He remains volume overloaded. Tolerating UFG 5L. Denies SOB, CP, and N/V.   Objective Vitals:   08/23/21 1145 08/23/21 1230 08/23/21 1350 08/23/21 1410  BP: 105/63 103/64 105/67 102/66  Pulse: (!) 106 (!) 107 (!) 106 (!) 107  Resp: (!) '23 20 18 '$ (!) 29  Temp:    98.5 F (36.9 C)  TempSrc:      SpO2: 100%  98% 98%  Weight:      Height:       Physical Exam General: Chronically-ill appearing; NAD; On RA Heart: S1 and S2; No murmurs, gallops, or rales Lungs: Clear anteriorly and diminished laterally Abdomen: Soft and mildly distended Extremities: 1-2+ edema BL thighs and Les; TED hose in place Dialysis Access: [facility]   Filed Weights   08/22/21 0459 08/23/21 0613 08/23/21 0936  Weight: 90.2 kg 90.1 kg 88.6 kg    Intake/Output Summary (Last 24 hours) at 08/23/2021 1457 Last data filed at 08/23/2021 0848 Gross per 24 hour  Intake 960 ml  Output 300 ml  Net 660 ml    Additional Objective Labs: Basic Metabolic Panel: Recent Labs  Lab 08/21/21 0133 08/22/21 0226 08/23/21 0403  NA 130* 129* 126*  128*  K 3.8 3.6 4.8  4.9  CL 91* 90* 88*  88*  CO2 '24 24 22  '$ 21*  GLUCOSE 204* 192* 126*  130*  BUN 23 19 42*  42*  CREATININE 2.93* 3.07* 5.24*  5.05*  CALCIUM 7.8* 7.7* 8.1*  8.2*  PHOS  --   --  5.5*   Liver Function Tests: Recent Labs  Lab 08/19/21 0226 08/20/21 0204 08/23/21 0403  AST 27 23  --   ALT 22 17  --   ALKPHOS 134* 114  --   BILITOT 0.5 0.7  --   PROT 5.2* 4.9*  --   ALBUMIN 3.1* 3.1* 3.3*   No results for input(s): "LIPASE", "AMYLASE" in the last 168 hours. CBC: Recent Labs  Lab 08/19/21 0226 08/20/21 0204 08/21/21 0133 08/23/21 0403  WBC 7.7 7.3 7.0 8.2  NEUTROABS 6.1  --   --   --   HGB 11.8* 11.4* 11.2* 12.1*  HCT 37.0* 35.9* 34.3* 37.4*  MCV 99.2 96.5 94.5 97.1  PLT 149* 135* 123* 127*   Blood Culture No results found for: "SDES",  "SPECREQUEST", "CULT", "REPTSTATUS"  Cardiac Enzymes: No results for input(s): "CKTOTAL", "CKMB", "CKMBINDEX", "TROPONINI" in the last 168 hours. CBG: Recent Labs  Lab 08/22/21 1129 08/22/21 1632 08/22/21 2113 08/23/21 0610 08/23/21 1448  GLUCAP 98 103* 142* 133* 132*   Iron Studies: No results for input(s): "IRON", "TIBC", "TRANSFERRIN", "FERRITIN" in the last 72 hours. Lab Results  Component Value Date   INR 1.0 08/19/2021   INR 1.3 (H) 12/06/2020   INR 1.2 11/22/2020   Studies/Results: No results found.  Medications:   allopurinol  150 mg Oral Daily   amiodarone  400 mg Oral BID   bumetanide  4 mg Oral Once per day on Sun Tue Thu   Chlorhexidine Gluconate Cloth  6 each Topical Q0600   docusate sodium  100 mg Oral BID   heparin  5,000 Units Subcutaneous Q8H   heparin lock flush  500 Units Intravenous Once   insulin aspart  0-6 Units Subcutaneous TID WC   midodrine  10 mg Oral 2 times per day on Mon Wed Fri Sat   mycophenolate  1,000  mg Oral BID   sodium chloride flush  3 mL Intravenous Q12H    Dialysis Orders: MWF at [facility] 4hr, 400/800, EDW 84.5 (doesn't typically reach, post wt 7/5 was 88.8kg), TDC, heparin 4000 unit bolus + 2500 unit mid-run bolus - Calcitriol 0.39mg PO q HD - no ESA  Assessment/Plan: SVT: S/p adenosine x 2 on admit, HR now 100-140 range, although shoots up with movement. TSH normal range. Cardiology feels he is in A flutter with 2:1 and started on po amiodarone. Known high-output HF/chronic volume overload: Considering IR to place PleurX for maligant ascites but according to last hospitalist note, pleurx is not an option unless patient is on hospice. Pt reports TR and R heart failure as driving factor for overload.  Recommend HD 4 days per week.  He understands that he is nearing end of life and states hospice of [facility] wont be involved as long as he continues with dialysis. Spoke to patient today who states that he wants to continue with HD  at this time.  Consider pigtail catheter for daily paracentesis as he is going every 2-3 days now and has to go to the hospital on the weekends.  Hx liver transplant 2013: Functioning per patient, continue IS regimen. ESRD:  Continue HD per usual MWF schedule - Given volume overload, SVT, and hypotension, will plan for HD 4 days per week to help manage volume.   Hypotension/volume: Chronic hypotension, uses midodrine pre-HD, may need to consider daily use. Chronic anasarca. Tells me pulls 5-5.5L w/ HD. Although inpatient machines limit UF to 1 L per hr, so 3-4 L max w/ each Rx. Will need daily HD while here most likely to get some of this extra volume off. He drinks WAY too much fluid - discussed.  Ascites: will likely require another paracentesis.  Anemia: Hgb 11.8, no ESA needed.  Metabolic bone disease: Ca ok, Phos pending.  Nutrition:  Alb slightly low  Disposition : poor overall prognosis and situation. Currently, he is refusing hospice and wants to continue with hemodialysis at this time. Palliative has little to offer given current situation. Continue goals of care discussion.  CTobie Poet NP [facility] 08/23/2021,2:57 PM  LOS: 2 days    Pt seen, examined and agree w assess/plan as above with additions as indicated.  [facility] 08/23/2021, 5:26 PM

## 2021-08-23 NOTE — Progress Notes (Signed)
Albumin administered to help with BP during HD treatment.

## 2021-08-24 DIAGNOSIS — I471 Supraventricular tachycardia: Secondary | ICD-10-CM | POA: Diagnosis not present

## 2021-08-24 DIAGNOSIS — I483 Typical atrial flutter: Secondary | ICD-10-CM | POA: Diagnosis not present

## 2021-08-24 LAB — BASIC METABOLIC PANEL
Anion gap: 15 (ref 5–15)
BUN: 31 mg/dL — ABNORMAL HIGH (ref 8–23)
CO2: 24 mmol/L (ref 22–32)
Calcium: 8.5 mg/dL — ABNORMAL LOW (ref 8.9–10.3)
Chloride: 90 mmol/L — ABNORMAL LOW (ref 98–111)
Creatinine, Ser: 3.83 mg/dL — ABNORMAL HIGH (ref 0.61–1.24)
GFR, Estimated: 16 mL/min — ABNORMAL LOW (ref >60)
Glucose, Bld: 106 mg/dL — ABNORMAL HIGH (ref 70–99)
Potassium: 4.7 mmol/L (ref 3.5–5.1)
Sodium: 129 mmol/L — ABNORMAL LOW (ref 135–145)

## 2021-08-24 LAB — CBC
HCT: 33.9 % — ABNORMAL LOW (ref 39.0–52.0)
Hemoglobin: 10.9 g/dL — ABNORMAL LOW (ref 13.0–17.0)
MCH: 30.9 pg (ref 26.0–34.0)
MCHC: 32.2 g/dL (ref 30.0–36.0)
MCV: 96 fL (ref 80.0–100.0)
Platelets: 131 10*3/uL — ABNORMAL LOW (ref 150–400)
RBC: 3.53 MIL/uL — ABNORMAL LOW (ref 4.22–5.81)
RDW: 16 % — ABNORMAL HIGH (ref 11.5–15.5)
WBC: 6.6 10*3/uL (ref 4.0–10.5)
nRBC: 0 % (ref 0.0–0.2)

## 2021-08-24 LAB — GLUCOSE, CAPILLARY
Glucose-Capillary: 110 mg/dL — ABNORMAL HIGH (ref 70–99)
Glucose-Capillary: 100 mg/dL — ABNORMAL HIGH (ref 70–99)
Glucose-Capillary: 200 mg/dL — ABNORMAL HIGH (ref 70–99)
Glucose-Capillary: 131 mg/dL — ABNORMAL HIGH (ref 70–99)

## 2021-08-24 MED ORDER — LIDOCAINE HCL (PF) 1 % IJ SOLN
5.0000 mL | INTRAMUSCULAR | Status: DC | PRN
Start: 2021-08-24 — End: 2021-08-25

## 2021-08-24 MED ORDER — HEPARIN SODIUM (PORCINE) 1000 UNIT/ML DIALYSIS
1000.0000 [IU] | INTRAMUSCULAR | Status: DC | PRN
  Administered 2021-08-25: 1000 [IU] via INTRAVENOUS_CENTRAL
  Filled 2021-08-24 (×2): qty 1

## 2021-08-24 MED ORDER — PENTAFLUOROPROP-TETRAFLUOROETH EX AERO: 1.0000 | INHALATION_SPRAY | CUTANEOUS | Status: DC | PRN

## 2021-08-24 MED ORDER — ALTEPLASE 2 MG IJ SOLR: 2.0000 mg | Freq: Once | INTRAMUSCULAR | Status: DC | PRN

## 2021-08-24 MED ORDER — LIDOCAINE-PRILOCAINE 2.5-2.5 % EX CREA
1.0000 | TOPICAL_CREAM | CUTANEOUS | Status: DC | PRN
Start: 2021-08-24 — End: 2021-08-25

## 2021-08-24 MED ORDER — ALBUMIN HUMAN 25 % IV SOLN
INTRAVENOUS | Status: AC
  Administered 2021-08-24: 0.25 g
  Filled 2021-08-24: qty 100

## 2021-08-24 NOTE — Progress Notes (Signed)
PROGRESS NOTE  Gregory Tanner  NUU:[id number] DOB: Nov 18, 1955 DOA: 08/19/2021 PCP: Ronita Hipps, MD   Brief Narrative:  Patient is a 66 year old male with history of hypertension, chronic diastolic CHF, severe right heart failure, pulmonary hypertension, severe TR, recurrent ascites chronic pain syndrome, ESRD on dialysis on Monday, Wednesday, Friday, diabetes type 2, NASH cirrhosis with hepatocellular carcinoma status post liver transplant presented with rapid heart rate, SVT and volume overload.  On presentation his heart rate was in the range of 200s.  She given adenosine and he  converted to normal sinus rhythm. patient was admitted for further management.  Cardiology, nephrology following. -Had paracentesis X2 this admission and now undergoing almost daily hemodialysis   Assessment & Plan:  SVT/Aflutter :  Heart rate in the range of 200s on presentation, treated w/adenosine -Subsequently with atrial flutter, RVR -Cards following, started on amiodarone -Options limited by hypotension, heart rate more stable  Volume overload/ESRD on dialysis: Dialyzed on Monday, Wednesday, Friday.  Nephrology following, dialysis as scheduled.   -Volume status improving, dialyzed today, plan for daily HD for now  Acute on chronic systolic and diastolic CHF Severe right heart failure, severe pulmonary hypertension Severe TR Recurrent ascites -Now ESRD and dialysis dependent , also on bumetanide  -Continue HD as tolerated, midodrine for hypotension  -Overall prognosis is very poor, patient understands this was told by [facility] advanced heart failure team that his prognosis is less than 6 months, declines hospice services as he wishes to continue HD  -Per IR Pleurx catheter for recurrent ascites is typically not an option unless patient is on hospice due to high risk of recurrent infections however given patient's complex situation, this case is being reconsidered, TOC to discuss with PCPs office and home  health agency regarding managing catheter  Hyponatremia:  -Secondary to hypervolemia and bumetanide, monitor with dialysis  Liver cirrhosis/history of hepatocellular carcinoma/ Refractory ascites: Gets regular paracentesis every 3 days.  Radiology consulted here and he underwent paracentesis 4 days ago.  History of liver transplantation: On CellCept.  IR also consulted for possible Pleurx catheter placement but as per IR he is not a candidate until he is on hospice.  He follows with [place]. Has chronic thrombocytopenia.  Repeat paracentesis completed yesterday, 2.2 L drained  Chronic hypotension: On midodrine  Diabetes type 2: Recent hemoglobin A1c of 5.2, indicating good control.  Continue sliding scale insulin  Hyperlipidemia: Not on medications  Goals of care: Overall poor prognosis, followed by advanced heart failure and nephrology at [facility], has been told his prognosis is 6 months or less.  He refuses hospice as he does not wish to stop dialysis yet and outpatient palliative has little to offer in this setting    DVT prophylaxis:heparin injection 5,000 Units Start: 08/19/21 1400     Code Status: Full Code, refuses to be DNR  Family Communication: Discussed with patient, wife at bedside Anticipate discharge home in 3 to 4 days  Consultants: Cardiology,nephrology, IR  Procedures: Dialysis, paracentesis  Antimicrobials:  Anti-infectives (From admission, onward)    None       Subjective: Back from dialysis, feels a little better, waiting to find out about Pleurx catheter  Objective: Vitals:   08/24/21 1139 08/24/21 1206 08/24/21 1303 08/24/21 1328  BP: (!) 100/58 (!) 103/59 (!) 109/58 (!) 111/59  Pulse: (!) 102 (!) 105 (!) 105 (!) 104  Resp: (!) '21 20 20 20  '$ Temp:   97.6 F (36.4 C)   TempSrc:  SpO2: 100% 99% 100% 100%  Weight:   86.3 kg   Height:        Intake/Output Summary (Last 24 hours) at 08/24/2021 1623 Last data filed at 08/24/2021 1426 Gross  per 24 hour  Intake 720 ml  Output 3500 ml  Net -2780 ml   Filed Weights   08/24/21 0527 08/24/21 0832 08/24/21 1303  Weight: 88.6 kg 88.9 kg 86.3 kg    Examination:   General exam: Chronically ill male sitting up in the recliner, back from dialysis HEENT: Positive JVD CVS: Irregularly irregular rhythm Lungs: Decreased breath sounds the bases Abdomen: Firm, less distended, bowel sounds present  Extremities: 2+ edema, TED hose on  Skin: No rashes on exposed skin   Data Reviewed: I have personally reviewed following labs and imaging studies  CBC: Recent Labs  Lab 08/19/21 0226 08/20/21 0204 08/21/21 0133 08/23/21 0403 08/24/21 0855  WBC 7.7 7.3 7.0 8.2 6.6  NEUTROABS 6.1  --   --   --   --   HGB 11.8* 11.4* 11.2* 12.1* 10.9*  HCT 37.0* 35.9* 34.3* 37.4* 33.9*  MCV 99.2 96.5 94.5 97.1 96.0  PLT 149* 135* 123* 127* 314*   Basic Metabolic Panel: Recent Labs  Lab 08/19/21 0226 08/20/21 0204 08/21/21 0133 08/22/21 0226 08/23/21 0403 08/24/21 0452  NA 133* 126* 130* 129* 126*  128* 129*  K 5.0 5.1 3.8 3.6 4.8  4.9 4.7  CL 93* 94* 91* 90* 88*  88* 90*  CO2 24 20* '24 24 22  '$ 21* 24  GLUCOSE 123* 110* 204* 192* 126*  130* 106*  BUN 22 37* 23 19 42*  42* 31*  CREATININE 3.04* 3.91* 2.93* 3.07* 5.24*  5.05* 3.83*  CALCIUM 7.9* 7.9* 7.8* 7.7* 8.1*  8.2* 8.5*  MG 2.0  --   --   --   --   --   PHOS  --   --   --   --  5.5*  --      No results found for this or any previous visit (from the past 240 hour(s)).   Radiology Studies: No results found.  Scheduled Meds:  allopurinol  150 mg Oral Daily   amiodarone  400 mg Oral BID   bumetanide  4 mg Oral Once per day on Sun Tue Thu   Chlorhexidine Gluconate Cloth  6 each Topical Q0600   docusate sodium  100 mg Oral BID   heparin  5,000 Units Subcutaneous Q8H   insulin aspart  0-6 Units Subcutaneous TID WC   midodrine  10 mg Oral 2 times per day on Mon Wed Fri Sat   mycophenolate  1,000 mg Oral BID   sodium  chloride flush  3 mL Intravenous Q12H   Continuous Infusions:   LOS: 3 days   Domenic Polite, MD [facility] P7/12/2021, 4:23 PM

## 2021-08-24 NOTE — Progress Notes (Addendum)
Notified by Dr. Broadus John that this patient would like to discuss about peritoneal PleurX catheter placement.   Mr. Sweetser is a 66 yo male with PMHsof history of liver transplant 2013, high output HF, ESRD on RRT, and recurrent ascites, he is  well known to IR service for previous paracentesis.  Mr. And Mrs. Leffler seen at bedside.   Mr. Cajamarca states that he has been requiring paracentesis more often, and it is affecting his quality of life significantly.  He states that he was told by several doctors that he has less than 1 year to live. He was informed about a peritoneal PleurX placement to manage recurrent ascites, and he was informed that peritoneal PleurX catheter is offered to hospice patients  only due to high post procedure risk such as development of peritonitis which can be life threatening.  He reached out to [facility] and he was told that he cannot be a hospice patient as long as he receives dialysis.   He is frustrated with the situation, and seeking for options to manage his recurrent ascites and ways to improve his quality of life.   Informed Mr. Brantley Persons that information he received  regarding peritoneal PleurX catheter is correct, it is usually offered for hospice patients only, but there has been times that IR placed peritoneal PleurX catheter to non hospice patients.  Informed Mr. Mcniel that it appears that PleurX catheter placement is indicted for him, but will need to verify with IR radiologist.   Informed Mr. Sheahan that IR place the catheter but is not able to follow up regarding ordering supplies for the PleurX as well as close monitoring of the patient to eval if he has s/s of peritonitis/sepsis/ other complications from PleurX catheter placement, therefor there should be a plan regarding who will be managing PleurX after placement before IR can proceed with PleruX placement.  He verbalized understanding.   Informed Mr. Quevedo that this PA will  present his case to IR radiologist to see if peritoneal PleurX catheter placement is indicated for him.  IR will follow.   Armando Gang Mishka Stegemann PA-C 08/24/2021 11:47 AM

## 2021-08-24 NOTE — Progress Notes (Signed)
Contacted [facility] today and spoke to [employer], [place]. Clinic advised pt will require 4x's a week treatment at d/c. Clinic will plan to add a treatment on Saturdays at d/c. Clinic will work with pt and advise pt of treatment times on Saturday at d/c. Will assist as needed.  Melven Sartorius Renal Navigator [phone]

## 2021-08-24 NOTE — Care Management Important Message (Signed)
Important Message  Patient Details  Name: Gregory Tanner MRN: [id number] Date of Birth: [birth year]   Medicare Important Message Given:  Yes     [name] 08/24/2021, 3:28 PM

## 2021-08-24 NOTE — Progress Notes (Signed)
[facility] Progress Note   Subjective:    Seen and examined on HD. No acute complaints.   Objective Vitals:   08/24/21 0930 08/24/21 0943 08/24/21 1000 08/24/21 1034  BP: (!) 106/57 (!) 103/55 (!) 112/57 (!) 101/59  Pulse: (!) 102 (!) 103 (!) 103 (!) 102  Resp: (!) 28 (!) 25 (!) 29 (!) 26  Temp:      TempSrc:      SpO2: 99% 95% 96% 100%  Weight:      Height:       Physical Exam General: Chronically-ill appearing; NAD; On RA Heart: S1 and S2; No murmurs, gallops, or rales Lungs: Clear anteriorly and diminished laterally Abdomen: Soft and mildly distended Extremities: 1-2+ edema BL thighs and Les; TED hose in place Dialysis Access: [facility]    Filed Weights   08/23/21 1409 08/24/21 0527 08/24/21 0832  Weight: 85.5 kg 88.6 kg 88.9 kg    Intake/Output Summary (Last 24 hours) at 08/24/2021 1049 Last data filed at 08/24/2021 0754 Gross per 24 hour  Intake 483.1 ml  Output --  Net 483.1 ml    Additional Objective Labs: Basic Metabolic Panel: Recent Labs  Lab 08/22/21 0226 08/23/21 0403 08/24/21 0452  NA 129* 126*  128* 129*  K 3.6 4.8  4.9 4.7  CL 90* 88*  88* 90*  CO2 24 22  21* 24  GLUCOSE 192* 126*  130* 106*  BUN 19 42*  42* 31*  CREATININE 3.07* 5.24*  5.05* 3.83*  CALCIUM 7.7* 8.1*  8.2* 8.5*  PHOS  --  5.5*  --    Liver Function Tests: Recent Labs  Lab 08/19/21 0226 08/20/21 0204 08/23/21 0403  AST 27 23  --   ALT 22 17  --   ALKPHOS 134* 114  --   BILITOT 0.5 0.7  --   PROT 5.2* 4.9*  --   ALBUMIN 3.1* 3.1* 3.3*   No results for input(s): "LIPASE", "AMYLASE" in the last 168 hours. CBC: Recent Labs  Lab 08/19/21 0226 08/20/21 0204 08/21/21 0133 08/23/21 0403 08/24/21 0855  WBC 7.7 7.3 7.0 8.2 6.6  NEUTROABS 6.1  --   --   --   --   HGB 11.8* 11.4* 11.2* 12.1* 10.9*  HCT 37.0* 35.9* 34.3* 37.4* 33.9*  MCV 99.2 96.5 94.5 97.1 96.0  PLT 149* 135* 123* 127* 131*   Blood Culture No results found for: "SDES",  "SPECREQUEST", "CULT", "REPTSTATUS"  Cardiac Enzymes: No results for input(s): "CKTOTAL", "CKMB", "CKMBINDEX", "TROPONINI" in the last 168 hours. CBG: Recent Labs  Lab 08/23/21 0610 08/23/21 1448 08/23/21 2130 08/23/21 2315 08/24/21 0639  GLUCAP 133* 132* 76 89 110*   Iron Studies: No results for input(s): "IRON", "TIBC", "TRANSFERRIN", "FERRITIN" in the last 72 hours. Lab Results  Component Value Date   INR 1.0 08/19/2021   INR 1.3 (H) 12/06/2020   INR 1.2 11/22/2020   Studies/Results: US Paracentesis  Result Date: 08/24/2021 INDICATION: Patient with history of NASH cirrhosis and HCC status post liver transplant, heart failure, ESRD with recurrent ascites. Request for therapeutic paracentesis. EXAM: ULTRASOUND GUIDED  PARACENTESIS MEDICATIONS: 7 mL 1% lidocaine COMPLICATIONS: None immediate. PROCEDURE: Informed written consent was obtained from the patient after a discussion of the risks, benefits and alternatives to treatment. A timeout was performed prior to the initiation of the procedure. Initial ultrasound scanning demonstrates a moderate amount of ascites within the left lower abdominal quadrant. The left lower abdomen was prepped and draped in the usual sterile  fashion. 1% lidocaine was used for local anesthesia. Following this, a 19 gauge, 7-cm, Yueh catheter was introduced. An ultrasound image was saved for documentation purposes. The paracentesis was performed. The catheter was removed and a dressing was applied. The patient tolerated the procedure well without immediate post procedural complication. FINDINGS: A total of approximately 2.4 L of hazy amber fluid was removed. IMPRESSION: Successful ultrasound-guided paracentesis yielding 2.4 liters of peritoneal fluid. PLAN: The patient has required >/=2 paracenteses in a 30 day period and a formal evaluation by the [facility] has been arranged. Read by: Durenda Guthrie, PA-C  Electronically Signed   By: Albin Felling M.D.   On: 08/24/2021 08:09    Medications:   allopurinol  150 mg Oral Daily   amiodarone  400 mg Oral BID   bumetanide  4 mg Oral Once per day on Sun Tue Thu   Chlorhexidine Gluconate Cloth  6 each Topical Q0600   docusate sodium  100 mg Oral BID   heparin  5,000 Units Subcutaneous Q8H   heparin lock flush  500 Units Intravenous Once   insulin aspart  0-6 Units Subcutaneous TID WC   midodrine  10 mg Oral 2 times per day on Mon Wed Fri Sat   mycophenolate  1,000 mg Oral BID   sodium chloride flush  3 mL Intravenous Q12H    Dialysis Orders: MWF at [facility] 4hr, 400/800, EDW 84.5 (doesn't typically reach, post wt 7/5 was 88.8kg), TDC, heparin 4000 unit bolus + 2500 unit mid-run bolus - Calcitriol 0.29mg PO q HD - no ESA  Assessment/Plan: SVT: S/p adenosine x 2 on admit, HR now 100-140 range, although shoots up with movement. TSH normal range. Cardiology feels he is in A flutter with 2:1 and started on po amiodarone. Known high-output HF/chronic volume overload: Considering IR to place PleurX for maligant ascites but according to last hospitalist note, pleurx is not an option unless patient is on hospice. Pt reports TR and R heart failure as driving factor for overload.  Recommend HD 4 days per week.  He understands that he is nearing end of life and states hospice of [place] wont be involved as long as he continues with dialysis. Spoke to patient today who states that he wants to continue with HD at this time.  Consider pigtail catheter for daily paracentesis as he is going every 2-3 days now and has to go to the hospital on the weekends.  Hx liver transplant 2013: Functioning per patient, continue IS regimen. ESRD:  Continue HD per usual MWF schedule - Given volume overload, SVT, and hypotension, will plan for HD 4 days per week in outpatient to help manage volume.   Hypotension/volume: Chronic hypotension, uses midodrine pre-HD, may need to  consider daily use. Chronic anasarca. Tells me pulls 5-5.5L w/ HD. Although inpatient machines limit UF to 1 L per hr, so 3-4 L max w/ each Rx. Will need daily HD while here most likely to get some of this extra volume off. Currently on HD. He drinks WAY too much fluid - discussed.  Ascites: will likely require another paracentesis.  Anemia: Hgb 10.9, monitor trend closely.  Metabolic bone disease: Ca ok, last PO4 ok.   Nutrition:  Alb slightly low  Disposition : poor overall prognosis and situation. Currently, he is refusing hospice and wants to continue with hemodialysis at this time. Palliative has little to offer given current situation. Continue goals of care discussion.  CTobie Poet NP [facility] 08/24/2021,10:49 AM  LOS: 3 days

## 2021-08-24 NOTE — Progress Notes (Addendum)
Progress Note  Patient Name: Gregory Tanner Date of Encounter: 08/24/2021  [facility] Cardiologist: Shirlee More, MD   Subjective   Patient reports that his breathing has improved significantly since starting amiodarone. HR has improved to the 90s-100s. Denies chest pain. Did have some mild SOB with dialysis yesterday   Inpatient Medications    Scheduled Meds:  allopurinol  150 mg Oral Daily   amiodarone  400 mg Oral BID   bumetanide  4 mg Oral Once per day on Sun Tue Thu   Chlorhexidine Gluconate Cloth  6 each Topical Q0600   docusate sodium  100 mg Oral BID   heparin  5,000 Units Subcutaneous Q8H   heparin lock flush  500 Units Intravenous Once   insulin aspart  0-6 Units Subcutaneous TID WC   midodrine  10 mg Oral 2 times per day on Mon Wed Fri Sat   mycophenolate  1,000 mg Oral BID   sodium chloride flush  3 mL Intravenous Q12H   Continuous Infusions:  PRN Meds: acetaminophen **OR** acetaminophen, alteplase, alum & mag hydroxide-simeth, bisacodyl, cetaphil, heparin, hydrALAZINE, lidocaine (PF), lidocaine-prilocaine, ondansetron **OR** ondansetron (ZOFRAN) IV, oxyCODONE, pentafluoroprop-tetrafluoroeth, polyethylene glycol   Vital Signs    Vitals:   08/23/21 2132 08/23/21 2200 08/24/21 0106 08/24/21 0527  BP: 125/76   118/70  Pulse: (!) 102   (!) 101  Resp:  '20 20 20  '$ Temp: 98 F (36.7 C)   (!) 97.4 F (36.3 C)  TempSrc: Oral   Oral  SpO2: 100%   100%  Weight:    88.6 kg  Height:        Intake/Output Summary (Last 24 hours) at 08/24/2021 0651 Last data filed at 08/23/2021 1748 Gross per 24 hour  Intake 483.1 ml  Output --  Net 483.1 ml      08/24/2021    5:27 AM 08/23/2021    2:09 PM 08/23/2021    9:36 AM  Last 3 Weights  Weight (lbs) 195 lb 6.4 oz 188 lb 7.9 oz 195 lb 5.2 oz  Weight (kg) 88.633 kg 85.5 kg 88.6 kg      Telemetry    Atrial flutter 2:1, HR in the 90s-100s - Personally Reviewed  ECG    No new tracings since 7/7 - Personally  Reviewed  Physical Exam   GEN: No acute distress. Sitting comfortably in the chair  Neck: +JVD Cardiac: RRR, no murmurs, rubs, or gallops.  Respiratory: Fine crackles in left lower lung base GI: Distended. Nontender to palpation  MS: 2+ Edema present in BLE to the knees. Legs are wrapped in ace bandaces  Neuro:  Nonfocal  Psych: Normal affect   Labs    High Sensitivity Troponin:  No results for input(s): "TROPONINIHS" in the last 720 hours.   Chemistry Recent Labs  Lab 08/19/21 0226 08/20/21 0204 08/21/21 0133 08/22/21 0226 08/23/21 0403 08/24/21 0452  NA 133* 126*   < > 129* 126*  128* 129*  K 5.0 5.1   < > 3.6 4.8  4.9 4.7  CL 93* 94*   < > 90* 88*  88* 90*  CO2 24 20*   < > 24 22  21* 24  GLUCOSE 123* 110*   < > 192* 126*  130* 106*  BUN 22 37*   < > 19 42*  42* 31*  CREATININE 3.04* 3.91*   < > 3.07* 5.24*  5.05* 3.83*  CALCIUM 7.9* 7.9*   < > 7.7* 8.1*  8.2* 8.5*  MG 2.0  --   --   --   --   --   PROT 5.2* 4.9*  --   --   --   --   ALBUMIN 3.1* 3.1*  --   --  3.3*  --   AST 27 23  --   --   --   --   ALT 22 17  --   --   --   --   ALKPHOS 134* 114  --   --   --   --   BILITOT 0.5 0.7  --   --   --   --   GFRNONAA 22* 16*   < > 21* 11*  12* 16*  ANIONGAP 16* 12   < > 15 16*  19* 15   < > = values in this interval not displayed.    Lipids No results for input(s): "CHOL", "TRIG", "HDL", "LABVLDL", "LDLCALC", "CHOLHDL" in the last 168 hours.  Hematology Recent Labs  Lab 08/20/21 0204 08/21/21 0133 08/23/21 0403  WBC 7.3 7.0 8.2  RBC 3.72* 3.63* 3.85*  HGB 11.4* 11.2* 12.1*  HCT 35.9* 34.3* 37.4*  MCV 96.5 94.5 97.1  MCH 30.6 30.9 31.4  MCHC 31.8 32.7 32.4  RDW 16.1* 15.9* 16.2*  PLT 135* 123* 127*   Thyroid  Recent Labs  Lab 08/19/21 0611  TSH 2.262  FREET4 0.79    BNP Recent Labs  Lab 08/19/21 0226  BNP 1,348.5*    DDimer No results for input(s): "DDIMER" in the last 168 hours.   Radiology    No results found.  Cardiac  Studies     Patient Profile     66 y.o. male with history of severe pulmonary hypertension, chronic volume overload/ascites, prior liver transplant, paroxysmal atrial flutter and end-stage renal disease on hemodialysis seen for a flutter with RVR.  Assessment & Plan    Atrial flutter with rapid ventricular rate - Originally presented with heart rate in 210s, given adenosine with improved rate to 140s. - Unable to add AV nodal blocking agents secondary to hypotension with dialysis - Not an anticoagulation candidate with prior long discussion regarding goals of care - Now on oral amiodarone due to limited rate controlling options. Symptoms significantly improved  - Currently rhythm looks like 2-1 atrial flutter on monitor but read as sinus rhythm, heart rate in 90s-100s  - Continue p.o. amiodarone.  Plan to give IV amio bolus and drip if needed    History of liver transplant/severe pulmonary pretension/high-output heart failure/end-stage renal disease on hemodialysis/ascites - Patient has multisystem organ dysfunction  - Typically followed by [facility]. Was recently given 1 year prognosis  - Often has hypotension during dialysis which limits how much fluid can be removed. Holding home BP medications to allow better dialysis and utilizing midodrine  - Volume status is improving with aggressive HD  - Patient produces very little urine, does take bumex which has been continued here - underwent Successful ultrasound guided paracentesis from the left lower quadrant 7/9 that yielded 2.4 L of hazy amber fluid.       For questions or updates, please contact [facility] Please consult [email or web address] for contact info under        Signed, Margie Billet, PA-C  08/24/2021, 6:51 AM    Seen in dialysis Volume overload from renal failure and liver dx flutter rate better controlled with amiodarone See above no anticoagulation AV nodal Drugs limited by midodrine dependant hypotension   Collier Salina  Johnsie Cancel MD [facility]

## 2021-08-24 NOTE — Care Management Important Message (Signed)
Important Message  Patient Details  Name: Gregory Tanner MRN: [id number] Date of Birth: 04/11/1955   Medicare Important Message Given:  Yes     [name] 08/24/2021, 3:27 PM

## 2021-08-25 DIAGNOSIS — Z992 Dependence on renal dialysis: Secondary | ICD-10-CM

## 2021-08-25 DIAGNOSIS — E8770 Fluid overload, unspecified: Secondary | ICD-10-CM

## 2021-08-25 DIAGNOSIS — I471 Supraventricular tachycardia: Secondary | ICD-10-CM | POA: Diagnosis not present

## 2021-08-25 DIAGNOSIS — N186 End stage renal disease: Secondary | ICD-10-CM | POA: Diagnosis not present

## 2021-08-25 DIAGNOSIS — Z794 Long term (current) use of insulin: Secondary | ICD-10-CM

## 2021-08-25 DIAGNOSIS — I50812 Chronic right heart failure: Secondary | ICD-10-CM

## 2021-08-25 DIAGNOSIS — E11649 Type 2 diabetes mellitus with hypoglycemia without coma: Secondary | ICD-10-CM | POA: Diagnosis not present

## 2021-08-25 DIAGNOSIS — Z7189 Other specified counseling: Secondary | ICD-10-CM

## 2021-08-25 DIAGNOSIS — I5033 Acute on chronic diastolic (congestive) heart failure: Secondary | ICD-10-CM

## 2021-08-25 DIAGNOSIS — N289 Disorder of kidney and ureter, unspecified: Secondary | ICD-10-CM

## 2021-08-25 LAB — GLUCOSE, CAPILLARY
Glucose-Capillary: 120 mg/dL — ABNORMAL HIGH (ref 70–99)
Glucose-Capillary: 106 mg/dL — ABNORMAL HIGH (ref 70–99)
Glucose-Capillary: 203 mg/dL — ABNORMAL HIGH (ref 70–99)
Glucose-Capillary: 136 mg/dL — ABNORMAL HIGH (ref 70–99)

## 2021-08-25 MED ORDER — LIDOCAINE HCL (PF) 1 % IJ SOLN: 5.0000 mL | INTRAMUSCULAR | Status: DC | PRN

## 2021-08-25 MED ORDER — PENTAFLUOROPROP-TETRAFLUOROETH EX AERO: 1.0000 | INHALATION_SPRAY | CUTANEOUS | Status: DC | PRN

## 2021-08-25 MED ORDER — ALBUMIN HUMAN 25 % IV SOLN
INTRAVENOUS | Status: AC
  Filled 2021-08-25: qty 50

## 2021-08-25 MED ORDER — ALBUMIN HUMAN 25 % IV SOLN
25.0000 g | Freq: Once | INTRAVENOUS | Status: AC
  Administered 2021-08-25: 25 g via INTRAVENOUS

## 2021-08-25 MED ORDER — ALTEPLASE 2 MG IJ SOLR: 2.0000 mg | Freq: Once | INTRAMUSCULAR | Status: DC | PRN

## 2021-08-25 MED ORDER — AMIODARONE HCL 200 MG PO TABS
200.0000 mg | ORAL_TABLET | Freq: Every day | ORAL | Status: DC
Start: 2021-09-09 — End: 2021-08-29

## 2021-08-25 MED ORDER — LIDOCAINE-PRILOCAINE 2.5-2.5 % EX CREA: 1.0000 | TOPICAL_CREAM | CUTANEOUS | Status: DC | PRN

## 2021-08-25 MED ORDER — AMIODARONE HCL 200 MG PO TABS
200.0000 mg | ORAL_TABLET | Freq: Two times a day (BID) | ORAL | Status: DC
Start: 2021-08-26 — End: 2021-08-29
  Administered 2021-08-26 – 2021-08-29 (×8): 200 mg via ORAL
  Filled 2021-08-25 (×8): qty 1

## 2021-08-25 MED ORDER — HEPARIN SODIUM (PORCINE) 1000 UNIT/ML DIALYSIS
1000.0000 [IU] | INTRAMUSCULAR | Status: DC | PRN
  Filled 2021-08-25: qty 1

## 2021-08-25 NOTE — Assessment & Plan Note (Signed)
Continue blood pressure support with midodrine.

## 2021-08-25 NOTE — Progress Notes (Signed)
[facility] Progress Note   Subjective:    Seen and examined patient on HD. No acute complaints. Tolerating UFG 3.5L.   Objective Vitals:   08/25/21 0930 08/25/21 1000 08/25/21 1030 08/25/21 1100  BP: 105/60 (!) 97/53 102/61 (!) 98/55  Pulse: (!) 104 (!) 102 100 (!) 101  Resp: (!) '26 20 20 '$ (!) 21  Temp:      TempSrc:      SpO2: 100% 99% 99% 98%  Weight:      Height:       Physical Exam General: Chronically-ill appearing; NAD; On RA Heart: S1 and S2; No murmurs, gallops, or rales Lungs: Clear anteriorly and diminished laterally Abdomen: Soft and mildly distended Extremities: 1-2+ edema BL thighs and Les; TED hose in place [facility]ialysis Access: [facility]   Filed Weights   08/24/21 0832 08/24/21 1303 08/25/21 0509  Weight: 88.9 kg 86.3 kg 88.4 kg    Intake/Output Summary (Last 24 hours) at 08/25/2021 1125 Last data filed at 08/25/2021 0300 Gross per 24 hour  Intake 957 ml  Output 3500 ml  Net -2543 ml    Additional Objective Labs: Basic Metabolic Panel: Recent Labs  Lab 08/22/21 0226 08/23/21 0403 08/24/21 0452  NA 129* 126*  128* 129*  K 3.6 4.8  4.9 4.7  CL 90* 88*  88* 90*  CO2 24 22  21* 24  GLUCOSE 192* 126*  130* 106*  BUN 19 42*  42* 31*  CREATININE 3.07* 5.24*  5.05* 3.83*  CALCIUM 7.7* 8.1*  8.2* 8.5*  PHOS  --  5.5*  --    Liver Function Tests: Recent Labs  Lab 08/19/21 0226 08/20/21 0204 08/23/21 0403  AST 27 23  --   ALT 22 17  --   ALKPHOS 134* 114  --   BILITOT 0.5 0.7  --   PROT 5.2* 4.9*  --   ALBUMIN 3.1* 3.1* 3.3*   No results for input(s): "LIPASE", "AMYLASE" in the last 168 hours. CBC: Recent Labs  Lab 08/19/21 0226 08/20/21 0204 08/21/21 0133 08/23/21 0403 08/24/21 0855  WBC 7.7 7.3 7.0 8.2 6.6  NEUTROABS 6.1  --   --   --   --   HGB 11.8* 11.4* 11.2* 12.1* 10.9*  HCT 37.0* 35.9* 34.3* 37.4* 33.9*  MCV 99.2 96.5 94.5 97.1 96.0  PLT 149* 135* 123* 127* 131*   Blood Culture No results found for: "SDES",  "SPECREQUEST", "CULT", "REPTSTATUS"  Cardiac Enzymes: No results for input(s): "CKTOTAL", "CKMB", "CKMBINDEX", "TROPONINI" in the last 168 hours. CBG: Recent Labs  Lab 08/24/21 0639 08/24/21 1326 08/24/21 1613 08/24/21 2200 08/25/21 0631  GLUCAP 110* 100* 200* 131* 120*   Iron Studies: No results for input(s): "IRON", "TIBC", "TRANSFERRIN", "FERRITIN" in the last 72 hours. Lab Results  Component Value Date   INR 1.0 08/19/2021   INR 1.3 (H) 12/06/2020   INR 1.2 11/22/2020   Studies/Results: No results found.  Medications:  albumin human     albumin human      allopurinol  150 mg Oral Daily   amiodarone  400 mg Oral BID   bumetanide  4 mg Oral Once per day on Sun Tue Thu   Chlorhexidine Gluconate Cloth  6 each Topical Q0600   docusate sodium  100 mg Oral BID   heparin  5,000 Units Subcutaneous Q8H   insulin aspart  0-6 Units Subcutaneous TID WC   midodrine  10 mg Oral 2 times per day on Mon Wed Fri Sat  mycophenolate  1,000 mg Oral BID   sodium chloride flush  3 mL Intravenous Q12H    Dialysis Orders: MWF at [place] 4hr, 400/800, EDW 84.5 (doesn't typically reach, post wt 7/5 was 88.8kg), TDC, heparin 4000 unit bolus + 2500 unit mid-run bolus - Calcitriol 0.59mg PO q HD - no ESA  Assessment/Plan: SVT: S/p adenosine x 2 on admit, HR now 100-140 range, although shoots up with movement. TSH normal range. Cardiology feels he is in A flutter with 2:1 and started on po amiodarone. Known high-output HF/chronic volume overload: Considering IR to place PleurX for maligant ascites but according to last hospitalist note, pleurx is not an option unless patient is on hospice. Pt reports TR and R heart failure as driving factor for overload.  Recommend HD 4 days per week.  He understands that he is nearing end of life and states hospice of [place] wont be involved as long as he continues with dialysis. Spoke to patient today who states that he wants to continue with HD at this  time.  Reviewed last IR note: considering placing a PleurX catheter for daily paracentesis as he is going every 2-3 days now and has to go to the hospital on the weekends. However, the issue now is who will managing this catheter (ordering supplies and close monitoring). Awaiting IR's decision on placement and determine who will be managing post placement. Hx liver transplant 2013: Functioning per patient, continue IS regimen. ESRD:  Continue HD per usual MWF schedule - Given volume overload, SVT, and hypotension, will plan for HD 4 days per week in outpatient to help manage volume.   Hypotension/volume: Chronic hypotension, uses midodrine pre-HD, may need to consider daily use. Chronic anasarca. Tells me pulls 5-5.5L w/ HD. Although inpatient machines limit UF to 1 L per hr, so 3-4 L max w/ each Rx. Will need daily HD while here most likely to get some of this extra volume off. Currently on HD. He drinks WAY too much fluid - discussed.  Ascites: will likely require another paracentesis.  Anemia: Hgb 10.9, monitor trend closely.  Metabolic bone disease: Ca ok, last PO4 ok.   Nutrition:  Alb slightly low  Disposition : poor overall prognosis and situation. Currently, he is refusing hospice and wants to continue with hemodialysis at this time. Palliative has little to offer given current situation. Continue goals of care discussion.    CTobie Poet NP [facility] 08/25/2021,11:25 AM  LOS: 4 days

## 2021-08-25 NOTE — Assessment & Plan Note (Addendum)
Atrial flutter.  Continue rate control with amiodarone per protocol load and maintenance. No anticoagulation due to high risk of bleeding, in the setting of liver failure.  No AV node blockade due to risk of hypotension.   At the time of his discharge he was on atrial flutter with rate in the low 100's.

## 2021-08-25 NOTE — Assessment & Plan Note (Addendum)
Echocardiogram (TEE)from 11/2020 with preserved LV systolic function EF 60 to 65%, D shaped septum, RV systolic function with mild reduction. Moderate TR.   Pulmonary hypertension with acute on chronic core pulmonale.   Continue ultrafiltration for volume management during hemodialysis.  Continue with midodrine for blood pressure support.

## 2021-08-25 NOTE — Progress Notes (Signed)
Received patient in bed, alert and oriented. Informed consent signed and in chart.  Time tx completed:1222  HD treatment completed. Patient tolerated well. Fistula/Graft/HD catheter without signs and symptoms of complications. Patient transported back to the room, alert and orient and in no acute distress. Report given to bedside RN.  Total UF removed:3.5L  Medication given:albumin  Post HD BH:[id number]   Post HD weight: 86.2kg

## 2021-08-25 NOTE — Hospital Course (Addendum)
Gregory Tanner was admitted to the hospital with the working diagnosis of uncontrolled atrial flutter in the setting of volume overload.   66 yo male with the past medical history of diastolic heart failure, pulmonary hypertension, cirrhosis, hepatocellular cancer sp liver transplant, NASH, and ESRD who presented with rapid heart rate. Patient noted easy fatigability at home with minimal efforts. EMS was called and found with HR 234 bpm, with worsening dyspnea. On his initial physical examination his blood pressure was 118/68, HR 110, RR 23 and 02 saturation 98%. Lungs with no rales or wheezing, increased work of breathing, heart with S1 and S2 present and tachycardic, abdomen distended and positive lower extremity edema.   Na 133 K 5.0 CL 93, bicarbonate 24 glucose 123 bun 22 cr 3,0  BNP 1,348  Wbc 7,7 hgb 11,8 plt 149   Chest radiograph with cardiomegaly, mild vascular congestion, HD cathter in the right IJ. Chest CT with no acute changes.  Left pelvic sidewall indeterminate soft tissue.   EKG 144 bpm, normal axis, normal qtc, sinus rhythm with PAC (bigeminy pattern), no significant ST segment or T wave changes.   Patient received adenosin in the ed.  Underlying rhythm atrial flutter, placed on amiodarone for rate control.   IR consulted for paracentesis  Getting aggressive ultrafiltration per HD.   He had peritoneal cathter for drainage as outpatient.  I spoke with Dr Helene Kelp, patient's primary care and she is Ok in following catheter as outpatient.   07/14 peritoneal cathter for ascitic fluid drainage has been placed.   Patient will continue HD as outpatient 4 days per week, and peritoneal drainage every other day. Home health services have been arranged.

## 2021-08-25 NOTE — Progress Notes (Signed)
Pt alert stable asymptomatic MD ordered to remain in uf unless sys bp<85

## 2021-08-25 NOTE — Progress Notes (Signed)
Progress Note  Patient Name: Gregory Tanner Date of Encounter: 08/25/2021  [facility] Cardiologist: Shirlee More, MD   Subjective   Seen on dialysis frustrated at issues getting peritoneal catheter  Inpatient Medications    Scheduled Meds:  allopurinol  150 mg Oral Daily   amiodarone  400 mg Oral BID   bumetanide  4 mg Oral Once per day on Sun Tue Thu   Chlorhexidine Gluconate Cloth  6 each Topical Q0600   docusate sodium  100 mg Oral BID   heparin  5,000 Units Subcutaneous Q8H   insulin aspart  0-6 Units Subcutaneous TID WC   midodrine  10 mg Oral 2 times per day on Mon Wed Fri Sat   mycophenolate  1,000 mg Oral BID   sodium chloride flush  3 mL Intravenous Q12H   Continuous Infusions:  albumin human     PRN Meds: acetaminophen **OR** acetaminophen, alteplase, alum & mag hydroxide-simeth, bisacodyl, cetaphil, heparin, hydrALAZINE, lidocaine (PF), lidocaine-prilocaine, ondansetron **OR** ondansetron (ZOFRAN) IV, oxyCODONE, pentafluoroprop-tetrafluoroeth, polyethylene glycol   Vital Signs    Vitals:   08/25/21 0509 08/25/21 0716 08/25/21 0830 08/25/21 0900  BP: 121/72 124/76 121/60 100/62  Pulse: 99 100 100 (!) 102  Resp: 20 16 (!) 27 (!) 30  Temp: 97.9 F (36.6 C) 97.6 F (36.4 C) 98.1 F (36.7 C)   TempSrc: Oral Oral Oral   SpO2: 100% 99% 98% 100%  Weight: 88.4 kg     Height:        Intake/Output Summary (Last 24 hours) at 08/25/2021 0920 Last data filed at 08/25/2021 0300 Gross per 24 hour  Intake 957 ml  Output 3500 ml  Net -2543 ml      08/25/2021    5:09 AM 08/24/2021    1:03 PM 08/24/2021    8:32 AM  Last 3 Weights  Weight (lbs) 194 lb 14.4 oz 190 lb 4.1 oz 195 lb 15.8 oz  Weight (kg) 88.406 kg 86.3 kg 88.9 kg      Telemetry    Atrial flutter 2:1, HR in the 90s-100s - Personally Reviewed  ECG    No new tracings since 7/7 - Personally Reviewed  Physical Exam   Chronically ill JVP elevated Lungs clear TR murmur  Ascites with  distended abdomen Plus 3 edema with legs wrapped   Labs    High Sensitivity Troponin:  No results for input(s): "TROPONINIHS" in the last 720 hours.   Chemistry Recent Labs  Lab 08/19/21 0226 08/20/21 0204 08/21/21 0133 08/22/21 0226 08/23/21 0403 08/24/21 0452  NA 133* 126*   < > 129* 126*  128* 129*  K 5.0 5.1   < > 3.6 4.8  4.9 4.7  CL 93* 94*   < > 90* 88*  88* 90*  CO2 24 20*   < > 24 22  21* 24  GLUCOSE 123* 110*   < > 192* 126*  130* 106*  BUN 22 37*   < > 19 42*  42* 31*  CREATININE 3.04* 3.91*   < > 3.07* 5.24*  5.05* 3.83*  CALCIUM 7.9* 7.9*   < > 7.7* 8.1*  8.2* 8.5*  MG 2.0  --   --   --   --   --   PROT 5.2* 4.9*  --   --   --   --   ALBUMIN 3.1* 3.1*  --   --  3.3*  --   AST 27 23  --   --   --   --  ALT 22 17  --   --   --   --   ALKPHOS 134* 114  --   --   --   --   BILITOT 0.5 0.7  --   --   --   --   GFRNONAA 22* 16*   < > 21* 11*  12* 16*  ANIONGAP 16* 12   < > 15 16*  19* 15   < > = values in this interval not displayed.    Lipids No results for input(s): "CHOL", "TRIG", "HDL", "LABVLDL", "LDLCALC", "CHOLHDL" in the last 168 hours.  Hematology Recent Labs  Lab 08/21/21 0133 08/23/21 0403 08/24/21 0855  WBC 7.0 8.2 6.6  RBC 3.63* 3.85* 3.53*  HGB 11.2* 12.1* 10.9*  HCT 34.3* 37.4* 33.9*  MCV 94.5 97.1 96.0  MCH 30.9 31.4 30.9  MCHC 32.7 32.4 32.2  RDW 15.9* 16.2* 16.0*  PLT 123* 127* 131*   Thyroid  Recent Labs  Lab 08/19/21 0611  TSH 2.262  FREET4 0.79    BNP Recent Labs  Lab 08/19/21 0226  BNP 1,348.5*    DDimer No results for input(s): "DDIMER" in the last 168 hours.   Radiology    No results found.  Cardiac Studies     Patient Profile     66 y.o. male with history of severe pulmonary hypertension, chronic volume overload/ascites, prior liver transplant, paroxysmal atrial flutter and end-stage renal disease on hemodialysis seen for a flutter with RVR.  Assessment & Plan    Atrial flutter with rapid  ventricular rate - rates ok - continue amiodarone - no anticoagulation given  goals of care/bleeding risk - No AV nodal drugs due to hypotension requiring midodrine   History of liver transplant/severe pulmonary pretension/high-output heart failure/end-stage renal disease on hemodialysis/ascites - Fluid managed with dialysis  - Trying to get IR to put pigtail/Pleur X peritoneal catheter in as he is requiring paracentesis every 3 days      Cardiology will sign off nothing additional to offer   For questions or updates, please contact [place] Please consult [email or web address] for contact info under        Signed, Jenkins Rouge, MD  08/25/2021, 9:20 AM

## 2021-08-25 NOTE — Assessment & Plan Note (Addendum)
His glucose remained stable during his hospitalization, he was treated with insulin sliding scale.

## 2021-08-25 NOTE — Assessment & Plan Note (Addendum)
Cirrhosis, NASH.  Patient with recurrent ascites. Requiring frequent paracentesis.   I spoke with Dr Helene Kelp and she will follow peritoneal catheter as outpatient.  Cathter has been placed and equipment being arranged for outpatient drainage.   Continue with mycophenolate.

## 2021-08-25 NOTE — Progress Notes (Signed)
Progress Note   Patient: Gregory Tanner WCB:[id number] DOB: 07-18-1955 DOA: 08/19/2021     4 DOS: the patient was seen and examined on 08/25/2021   Brief hospital course: Gregory Tanner was admitted to the hospital with the working diagnosis of uncontrolled atrial flutter in the setting of volume overload.   66 yo male with the past medical history of diastolic heart failure, pulmonary hypertension, cirrhosis, hepatocellular cancer sp liver transplant, NASH, and ESRD who presented with rapid heart rate. Patient noted easy fatigability at home with minimal efforts. EMS was called and found with HR 234 bpm, with worsening dyspnea. His blood pressure 118/68, HR 110, RR 23 and 02 saturation 98%. Lungs with no rales or wheezing, increased work of breathing, heart with S1 and S2 present and tachycardic, abdomen distended and positive lower extremity edema.   Na 133 K 5.0 CL 93, bicarbonate 24 glucose 123 bun 22 cr 3,0  BNP 1,348  Wbc 7,7 hgb 11,8 plt 149   Chest radiograph with cardiomegaly, mild vascular congestion, HD cathter in the right IJ. Chest CT with no acute changes.  Left pelvic sidewall indeterminate soft tissue.   EKG 144 bpm, normal axis, normal qtc, sinus rhythm with PAC (bigeminy pattern), no significant ST segment or T wave changes.   Patient received adenosin in the ed.  Underlying rhythm atrial flutter.   IR consulted for paracentesis  Getting aggressive ultrafiltration per HD.   Assessment and Plan: * SVT (supraventricular tachycardia) (HCC) Continue rate control with amiodarone.    ESRD on dialysis (Gregory Tanner) Hyponatremia.   Patient with severe volume overload.  Currently getting UF every day per nephrology recommendations.   Plan to follow up on electrolytes in am.  Patient had HD today.  Required albumin due to hypotension.    Acute on chronic diastolic CHF (congestive heart failure) (HCC) Volume overload due to renal failure, and liver failure.  Continue  ultrafiltration per HD Patient also is requiring paracentesis to remove fluid.   History of liver transplant (Gregory Tanner) Cirrhosis, NASH.  Patient with recurrent ascites. Requiring frequent paracentesis.   Plan to get peritoneal cathter for drainage, will call his primary car provider, hopefully she can take care of cathter as outpatient.  I have explained Gregory Tanner that risk of cathter infection is high.  He would like to continue HD and not go into hospice.  Continue with mycophenolate.   Type 2 diabetes mellitus (HCC) Continue glucose cover and monitoring with insulin sliding scale.  Patient is tolerating po well.   Hypotension Continue blood pressure support with midodrine.         Subjective: patient complains of abdominal distention, needing repeat paracentesis   Physical Exam: Vitals:   08/25/21 1100 08/25/21 1130 08/25/21 1200 08/25/21 1230  BP: (!) 98/55 (!) 99/59 91/75 120/60  Pulse: (!) 101 (!) 102 (!) 101 71  Resp: (!) 21 (!) 23 (!) 22 20  Temp:    98.1 F (36.7 C)  TempSrc:      SpO2: 98% 100% 100% 100%  Weight:    86.2 kg  Height:       Neurology awake and alert ENT mild pallor and no icterus Cardiovascular with S1 and S2 present and tachycardic with no gallops Respiratory with no rales or wheezing Abdomen is distended Lower extremities with wraps, positive pitting edema +++  Data Reviewed:    Family Communication: I spoke with patient's wife at the bedside, we talked in detail about patient's condition, plan of care and prognosis  and all questions were addressed. c  Disposition: Status is: Inpatient Remains inpatient appropriate because: continue volume overloadd   Planned Discharge Destination: Home      Author: Tawni Millers, MD 08/25/2021 4:12 PM  For on call review [email or web address].

## 2021-08-25 NOTE — Assessment & Plan Note (Addendum)
Hyponatremia.   Patient with severe volume overload.  Patient had UF every day per nephrology recommendations.   Bumetanide on non HD days.   Continue outpatient HD 4 days a week, patient with poor prognosis.

## 2021-08-26 DIAGNOSIS — Z944 Liver transplant status: Secondary | ICD-10-CM | POA: Diagnosis not present

## 2021-08-26 DIAGNOSIS — N186 End stage renal disease: Secondary | ICD-10-CM | POA: Diagnosis not present

## 2021-08-26 DIAGNOSIS — I471 Supraventricular tachycardia: Secondary | ICD-10-CM | POA: Diagnosis not present

## 2021-08-26 DIAGNOSIS — I5033 Acute on chronic diastolic (congestive) heart failure: Secondary | ICD-10-CM | POA: Diagnosis not present

## 2021-08-26 DIAGNOSIS — I959 Hypotension, unspecified: Secondary | ICD-10-CM

## 2021-08-26 LAB — BASIC METABOLIC PANEL
Anion gap: 17 — ABNORMAL HIGH (ref 5–15)
BUN: 36 mg/dL — ABNORMAL HIGH (ref 8–23)
CO2: 20 mmol/L — ABNORMAL LOW (ref 22–32)
Calcium: 8.3 mg/dL — ABNORMAL LOW (ref 8.9–10.3)
Chloride: 89 mmol/L — ABNORMAL LOW (ref 98–111)
Creatinine, Ser: 4.05 mg/dL — ABNORMAL HIGH (ref 0.61–1.24)
GFR, Estimated: 15 mL/min — ABNORMAL LOW (ref >60)
Glucose, Bld: 131 mg/dL — ABNORMAL HIGH (ref 70–99)
Potassium: 4.7 mmol/L (ref 3.5–5.1)
Sodium: 126 mmol/L — ABNORMAL LOW (ref 135–145)

## 2021-08-26 LAB — GLUCOSE, CAPILLARY
Glucose-Capillary: 126 mg/dL — ABNORMAL HIGH (ref 70–99)
Glucose-Capillary: 94 mg/dL (ref 70–99)
Glucose-Capillary: 139 mg/dL — ABNORMAL HIGH (ref 70–99)
Glucose-Capillary: 115 mg/dL — ABNORMAL HIGH (ref 70–99)

## 2021-08-26 MED ORDER — LIDOCAINE-PRILOCAINE 2.5-2.5 % EX CREA: 1.0000 | TOPICAL_CREAM | CUTANEOUS | Status: DC | PRN

## 2021-08-26 MED ORDER — LIDOCAINE HCL (PF) 1 % IJ SOLN: 5.0000 mL | INTRAMUSCULAR | Status: DC | PRN

## 2021-08-26 MED ORDER — ALTEPLASE 2 MG IJ SOLR: 2.0000 mg | Freq: Once | INTRAMUSCULAR | Status: DC | PRN

## 2021-08-26 MED ORDER — PENTAFLUOROPROP-TETRAFLUOROETH EX AERO: 1.0000 | INHALATION_SPRAY | CUTANEOUS | Status: DC | PRN

## 2021-08-26 MED ORDER — MIDODRINE HCL 5 MG PO TABS
10.0000 mg | ORAL_TABLET | Freq: Once | ORAL | Status: DC
Start: 2021-08-26 — End: 2021-08-29
  Filled 2021-08-26: qty 2

## 2021-08-26 MED ORDER — HEPARIN SODIUM (PORCINE) 1000 UNIT/ML DIALYSIS: 1000.0000 [IU] | INTRAMUSCULAR | Status: DC | PRN

## 2021-08-26 MED ORDER — CEFAZOLIN SODIUM-DEXTROSE 2-4 GM/100ML-% IV SOLN
2.0000 g | INTRAVENOUS | Status: AC
Start: 2021-08-27 — End: 2021-08-27
  Filled 2021-08-26: qty 100

## 2021-08-26 NOTE — TOC Initial Note (Signed)
Transition of Care [facility]) - Initial/Assessment Note    Patient Details  Name: Gregory Tanner MRN: [id number] Date of Birth: 1955-07-24  Transition of Care [facility]) CM/SW Contact:    Bethena Roys, RN Phone Number: 08/26/2021, 11:41 AM  Clinical Narrative: Case Manager received notification that the patient will need a PleurX cath. Case Manager received a call from the [place] that IR had questions on which provider would follow the patient/PleurX for orders and supplies post hospitalization. Case Manager spoke with Gregory Tanner in IR and Dr Cathlean Sauer.  Dr. Cathlean Sauer called the patients PCP and Gregory Tanner is agreeable to follow the patient with orders/supplies. PleurX will be placed tomorrow- forms will need to be filled out for [facility] and Faxed to company. The floor will need to order a box of ten PleurX drains prior to transition home. Case Manager did reach out to [facility] and they cannot service the patient in [place]. Case Manager called [facility] and it depends on how often the patient is being drained and how many supplies the patient will go home with before the agency can state if they can service. Unit Case Manager will continue to follow this patient.    Expected Discharge Plan: [place] Barriers to Discharge: Continued Medical Work up   Patient Goals and CMS Choice Patient states their goals for this hospitalization and ongoing recovery are:: to return home with home health      Expected Discharge Plan and Services Expected Discharge Plan: [place] In-house Referral: Clinical Social Work Discharge Planning Services: CM Consult Post Acute Care Choice: Home Health, Durable Medical Equipment Living arrangements for the past 2 months: Single Family Home                 DME Arranged: Chest tube pluerex DME Agency: Other - Comment ([facility])       HH Arranged: RN, Disease Management      Prior Living Arrangements/Services Living  arrangements for the past 2 months: Single Family Home Lives with:: Spouse Patient language and need for interpreter reviewed:: Yes Do you feel safe going back to the place where you live?: Yes      Need for Family Participation in Patient Care: Yes (Comment) Care giver support system in place?: Yes (comment) Current home services: DME (Patient has rolling walker)    Activities of Daily Living Home Assistive Devices/Equipment: CBG Meter ADL Screening (condition at time of admission) Patient's cognitive ability adequate to safely complete daily activities?: Yes Is the patient deaf or have difficulty hearing?: Yes Does the patient have difficulty seeing, even when wearing glasses/contacts?: No Does the patient have difficulty concentrating, remembering, or making decisions?: No Patient able to express need for assistance with ADLs?: Yes Does the patient have difficulty dressing or bathing?: Yes Independently performs ADLs?: No Communication: Independent Dressing (OT): Needs assistance Is this a change from baseline?: Pre-admission baseline Grooming: Independent Feeding: Independent Bathing: Needs assistance Is this a change from baseline?: Pre-admission baseline Toileting: Independent In/Out Bed: Independent Walks in Home: Independent Does the patient have difficulty walking or climbing stairs?: Yes Weakness of Legs: None Weakness of Arms/Hands: Both  Permission Sought/Granted Permission sought to share information with : Family Supports, Case Manager     Emotional Assessment Appearance:: Appears stated age       Alcohol / Substance Use: Not Applicable Psych Involvement: No (comment)  Admission diagnosis:  SVT (supraventricular tachycardia) ([place]) [I47.1] Patient Active Problem List   Diagnosis Date  Noted   SVT (supraventricular tachycardia) ([place]) 08/19/2021   Hypotension 08/19/2021   ESRD on dialysis ([place]) 07/13/2021   Acute renal failure superimposed on stage 3b  chronic kidney disease ([place]) 04/05/2021   Uremia 04/05/2021   Acute respiratory distress 12/05/2020   Ascites    Thrombocytopenia ([place]) 11/23/2020   Iron deficiency anemia 11/23/2020   Acute exacerbation of CHF (congestive heart failure) ([place]) 11/22/2020   PHT (pulmonary hypertension) ([place]) 01/19/2020   Swelling    Liver disease    Hypertension    Hearing loss    Diabetes mellitus without complication (HCC)    Hypokalemia    Acute on chronic diastolic CHF (congestive heart failure) ([place]) 07/24/2019   Uncontrolled type 2 diabetes mellitus with insulin therapy 06/21/2018   CKD (chronic kidney disease) stage 3, GFR 30-59 ml/min (HCC) 03/27/2017   Chronic diastolic heart failure ([place]) 09/26/2016   Chronic pain disorder 10/28/2015   SOB (shortness of breath) 10/28/2015   Hypertensive heart disease with heart failure ([place]) 10/26/2015   Benign essential hypertension 10/26/2015   Chronic pain associated with significant psychosocial dysfunction 06/17/2015   Nerve root pain 06/17/2015   Ankylosis of lumbar spine 02/21/2014   Degeneration of intervertebral disc of lumbar region 07/05/2013   Lumbar radiculopathy 10/10/2012   Degenerative disc disease, lumbar 07/26/2012   Goals of care, counseling/discussion 10/10/2011   Excess weight 10/10/2011   History of liver transplant ([place]) 07/20/2011   Type 2 diabetes mellitus ([place]) 12/27/2010   Hepatocellular carcinoma ([place]) 12/27/2010   BP (high blood pressure) 12/27/2010   PCP:  Ronita Hipps, MD Pharmacy:   [facility], [place] [address] [place] [id number] [place] [address] Phone: [phone] Fax: [phone]  [facility] [address] [place] [address] Phone: [phone] Fax: [phone]     Social Determinants of Health (SDOH) Interventions    Readmission Risk Interventions    06/03/2021    9:04 AM 12/03/2020    1:43 PM  Readmission Risk Prevention Plan   Transportation Screening Complete Complete  PCP or Specialist Appt within 3-5 Days  Complete  HRI or [place]  Complete  Social Work Consult for [place] Planning/Counseling  Complete  Palliative Care Screening  Not Applicable  Medication Review Press photographer) Complete Complete  PCP or Specialist appointment within 3-5 days of discharge Complete   HRI or [place] Complete   SW Recovery Care/Counseling Consult Complete   [place] Not Applicable

## 2021-08-26 NOTE — TOC Progression Note (Signed)
Transition of Care [facility]) - Progression Note    Patient Details  Name: Gregory Tanner MRN: [id number] Date of Birth: [birth year]  Transition of Care [facility]) CM/SW [place], RN Phone Number: 08/26/2021, 2:06 PM  Clinical Narrative:     Patient interested in keeping [facility] as his home Health agency, the nurse reports. Sharmon Revere from [facility], called to ensure they are available for RN to address.He is active with RN and they can continue to service the patient. They will need a starter pack sent home with patient ( 10 bulbs) and other items. And then they will order from there. He will need teaching on how to drain the pluerex.   Expected Discharge Plan: [place] Barriers to Discharge: Continued Medical Work up  Expected Discharge Plan and Services Expected Discharge Plan: [place] In-house Referral: Clinical Social Work Discharge Planning Services: CM Consult Post Acute Care Choice: Home Health, Durable Medical Equipment Living arrangements for the past 2 months: Single Family Home                 DME Arranged: Chest tube pluerex DME Agency: Other - Comment ([facility])       HH Arranged: RN, Disease Management           Social Determinants of Health (SDOH) Interventions    Readmission Risk Interventions    06/03/2021    9:04 AM 12/03/2020    1:43 PM  Readmission Risk Prevention Plan  Transportation Screening Complete Complete  PCP or Specialist Appt within 3-5 Days  Complete  HRI or [place]  Complete  Social Work Consult for [place] Planning/Counseling  Complete  Palliative Care Screening  Not Applicable  Medication Review Press photographer) Complete Complete  PCP or Specialist appointment within 3-5 days of discharge Complete   HRI or [place] Complete   SW Recovery Care/Counseling Consult Complete   [place] Not  Applicable

## 2021-08-26 NOTE — Progress Notes (Signed)
[facility] Progress Note   Subjective:    Patient receiving HD daily for now. Tolerated net UF 3.5L yesterday. Patient reports SOB this AM, LE edema appears to be improving. UFG 3.5-4L as tolerated. Per patient, IR to decide between performing paracentesis vs pleurX catheter placement today.  Objective Vitals:   08/25/21 1200 08/25/21 1230 08/25/21 2134 08/26/21 0430  BP: 91/75 120/60 122/72 113/69  Pulse: (!) 101 71 (!) 102 98  Resp: (!) '22 20 17 '$ (!) 23  Temp:  98.1 F (36.7 C) (!) 97.5 F (36.4 C) 97.8 F (36.6 C)  TempSrc:   Oral Oral  SpO2: 100% 100% 98% 100%  Weight:  86.2 kg  87.8 kg  Height:       Physical Exam General: Chronically-ill appearing; NAD; On 2L [place] Heart: S1 and S2; No murmurs, gallops, or rales Lungs: Diminished laterally and clear anteriorly Abdomen: Round and distended Extremities: LE edema improving, continue ace wraps to LEs Dialysis Access: [facility]   Filed Weights   08/25/21 0509 08/25/21 1230 08/26/21 0430  Weight: 88.4 kg 86.2 kg 87.8 kg    Intake/Output Summary (Last 24 hours) at 08/26/2021 0910 Last data filed at 08/26/2021 2426 Gross per 24 hour  Intake 1012 ml  Output 3500 ml  Net -2488 ml    Additional Objective Labs: Basic Metabolic Panel: Recent Labs  Lab 08/23/21 0403 08/24/21 0452 08/26/21 0242  NA 126*  128* 129* 126*  K 4.8  4.9 4.7 4.7  CL 88*  88* 90* 89*  CO2 22  21* 24 20*  GLUCOSE 126*  130* 106* 131*  BUN 42*  42* 31* 36*  CREATININE 5.24*  5.05* 3.83* 4.05*  CALCIUM 8.1*  8.2* 8.5* 8.3*  PHOS 5.5*  --   --    Liver Function Tests: Recent Labs  Lab 08/20/21 0204 08/23/21 0403  AST 23  --   ALT 17  --   ALKPHOS 114  --   BILITOT 0.7  --   PROT 4.9*  --   ALBUMIN 3.1* 3.3*   No results for input(s): "LIPASE", "AMYLASE" in the last 168 hours. CBC: Recent Labs  Lab 08/20/21 0204 08/21/21 0133 08/23/21 0403 08/24/21 0855  WBC 7.3 7.0 8.2 6.6  HGB 11.4* 11.2* 12.1* 10.9*  HCT  35.9* 34.3* 37.4* 33.9*  MCV 96.5 94.5 97.1 96.0  PLT 135* 123* 127* 131*   Blood Culture No results found for: "SDES", "SPECREQUEST", "CULT", "REPTSTATUS"  Cardiac Enzymes: No results for input(s): "CKTOTAL", "CKMB", "CKMBINDEX", "TROPONINI" in the last 168 hours. CBG: Recent Labs  Lab 08/25/21 0631 08/25/21 1313 08/25/21 1550 08/25/21 2120 08/26/21 0558  GLUCAP 120* 106* 203* 136* 126*   Iron Studies: No results for input(s): "IRON", "TIBC", "TRANSFERRIN", "FERRITIN" in the last 72 hours. Lab Results  Component Value Date   INR 1.0 08/19/2021   INR 1.3 (H) 12/06/2020   INR 1.2 11/22/2020   Studies/Results: No results found.  Medications:   allopurinol  150 mg Oral Daily   amiodarone  200 mg Oral BID   Followed by   Derrill Memo ON 09/09/2021] amiodarone  200 mg Oral Daily   bumetanide  4 mg Oral Once per day on Sun Tue Thu   Chlorhexidine Gluconate Cloth  6 each Topical Q0600   docusate sodium  100 mg Oral BID   heparin  5,000 Units Subcutaneous Q8H   insulin aspart  0-6 Units Subcutaneous TID WC   midodrine  10 mg Oral 2 times per day  on Mon Wed Fri Sat   midodrine  10 mg Oral Once in dialysis   mycophenolate  1,000 mg Oral BID   sodium chloride flush  3 mL Intravenous Q12H    Dialysis Orders: MWF at [facility] 4hr, 400/800, EDW 84.5 (doesn't typically reach, post wt 7/5 was 88.8kg), TDC, heparin 4000 unit bolus + 2500 unit mid-run bolus - Calcitriol 0.10mg PO q HD - no ESA  Assessment/Plan: SVT: S/p adenosine x 2 on admit, HR now 100-140 range, although shoots up with movement. TSH normal range. Cardiology feels he is in A flutter with 2:1 and started on po amiodarone. Known high-output HF/chronic volume overload: Considering IR to place PleurX for maligant ascites but according to last hospitalist note, pleurx is not an option unless patient is on hospice. Pt reports TR and R heart failure as driving factor for overload.  Recommend HD 4 days per week.  He understands  that he is nearing end of life and states hospice of [facility] wont be involved as long as he continues with dialysis. Spoke to patient today who states that he wants to continue with HD at this time.  Reviewed last IR note: considering placing a PleurX catheter for daily paracentesis as he is going every 2-3 days now and has to go to the hospital on the weekends. However, the issue now is who will managing this catheter (ordering supplies and close monitoring). Awaiting IR's decision on placement and determine who will be managing post placement. Paracentesis ordered for today. Hx liver transplant 2013: Functioning per patient, continue IS regimen. ESRD:  Continue HD per usual MWF schedule - Given volume overload, SVT, and hypotension, will plan for HD 4 days per week in outpatient to help manage volume.   Hyponatremia-more likely d/t hypervolemia. Receiving HD daily for now to remove excess volume. Hypotension/volume: Chronic hypotension, uses midodrine pre-HD, may need to consider daily use. Chronic anasarca. Tells me pulls 5-5.5L w/ HD. Although inpatient machines limit UF to 1 L per hr, so 3-4 L max w/ each Rx. Will need daily HD while here most likely to get some of this extra volume off. LEs are improving. Currently on HD. He drinks WAY too much fluid - discussed.  Ascites: paracentesis ordered for today.  Anemia: Hgb 10.9, monitor trend closely.  Metabolic bone disease: Ca ok, last PO4 ok.   Nutrition:  Alb slightly low  Disposition : poor overall prognosis and situation. Currently, he is refusing hospice and wants to continue with hemodialysis at this time. Palliative has little to offer given current situation. Continue goals of care discussion.  CTobie Poet NP [facility] 08/26/2021,9:10 AM  LOS: 5 days

## 2021-08-26 NOTE — Consult Note (Addendum)
Chief Complaint: Patient was seen in consultation today for recurrent ascites Chief Complaint  Patient presents with   Shortness of Breath   Chest Pain   at the request of [name], [name]   Referring Physician(s): [name], [name]   Supervising Physician: [name]  Patient Status: [name] - In-pt  History of Present Illness: Gregory Tanner is a 66 y.o. male with PMHs of heart failure, pulmonary hypertension, cirrhosis, hepatocellular cancer sp liver transplant in 2013, NASH, ESRD and recurrent ascites.   Patient was brought to [facility] ED on 7/6 due to syncopal episode and rapid heart rate, his HR was 234 when EMS was arrived. Patient also complained shortness of breath, patient underwent paracentesis in ED which helped with breathing.   Cardiology was consulted, patient was last seen by [name] department in October 2022 and underwent extensive cardiac work up, since then he has been followed by cardiology team at [facility] for high output heart failure and right-sided heart failure. Patient was recently told that he has less than 1 year to live by his cardiologist at [name].   Patient is well known to IR service for previous paracentesis, he used to require paracentesis twice a month until June 2023 per chart. Paracentesis was performed with maximum of 3 L removal.  In June 2023, patient required paracentesis five times and each time it yielded about 3 L of ascites.  Patient underwent paracentesis twice in July.   IR was reached out by primary team that patient wishes to discuss possibility of peritoneal tunneled catheter to manage the recurrent ascites.   Patient was seen by IR bedside on 7/9 when possibility of peritoneal tunneled catheter (PleurX) placement was discuss. Since then, it has been determined  the PleurX catheter placement is indicated for this patient by [name] from IR, outpatient management for the PleurX has been arranged (patient's PCP Dr.  [name] will follow the patient with orders/supplies,) confirmed that his previous home health agency, [facility], will be able to continue to provide home health service to address patient's needs.   [name] was revisit today at bedside.   Risk, benefit, and possible complication such as leakage around the exit site, drain malfunctioning which may require removal/reposition were discussed with the patient. After thorough discussion and shared decision making, patient decided to proceed with peritoneal PleurX catheter placement.   Patient reports shortness of breath today which is due to ascites.  Patient denies other complaints.   Past Medical History:  Diagnosis Date   Ankylosis of lumbar spine 02/21/2014   Benign essential hypertension 10/26/2015   Chronic diastolic heart failure ([place]) 09/26/2016   Chronic pain associated with significant psychosocial dysfunction 06/17/2015   Degeneration of intervertebral disc of lumbar region 07/05/2013   ESRD (end stage renal disease) on dialysis ([place]) 03/27/2017   Hearing loss    Hepatocellular carcinoma ([place]) 12/27/2010   Overview:  Embolized 6/12    History of liver transplant ([place]) 07/20/2011   Overview:  06/07/2011 for cryptogenic cirrhosis and HCC    Uncontrolled type 2 diabetes mellitus with insulin therapy 06/21/2018    Past Surgical History:  Procedure Laterality Date   AV FISTULA PLACEMENT Left 07/16/2021   Procedure: LEFT ARM RADIOCEPHALIC ARTERIOVENOUS  FISTULA CREATION;  Surgeon: [name], MD;  Location: [place];  Service: Vascular;  Laterality: Left;   [place]  12/14/2020   Procedure: BUBBLE STUDY;  Surgeon: [name], MD;  Location: [place];  Service: Cardiovascular;;   EYE SURGERY Left    HERNIA REPAIR     IR PARACENTESIS  11/30/2020   IR PARACENTESIS  05/20/2021   IR PARACENTESIS  06/01/2021   IR PARACENTESIS  06/17/2021   IR  PARACENTESIS  07/13/2021   IR PARACENTESIS  07/20/2021   IR PARACENTESIS  07/27/2021   IR PARACENTESIS  07/30/2021   IR PARACENTESIS  08/02/2021   IR PARACENTESIS  08/09/2021   LIVER SURGERY     IMPLANT   LIVER TRANSPLANT     RIGHT HEART CATH N/A 12/11/2020   Procedure: RIGHT HEART CATH;  Surgeon: [name], MD;  Location: [place] [facility];  Service: Cardiovascular;  Laterality: N/A;   TEE WITHOUT CARDIOVERSION N/A 12/14/2020   Procedure: TRANSESOPHAGEAL ECHOCARDIOGRAM (TEE);  Surgeon: [name], MD;  Location: [facility] ENDOSCOPY;  Service: Cardiovascular;  Laterality: N/A;   TONSILLECTOMY      Allergies: Iodinated contrast media, Jardiance [empagliflozin], Latex, Metformin and related, Other, and Rosiglitazone  Medications: Prior to Admission medications   Medication Sig Start Date End Date Taking? Authorizing Provider  acetaminophen (TYLENOL) 325 MG tablet Take 650 mg by mouth every 6 (six) hours as needed for mild pain.   Yes [provider]  allopurinol (ZYLOPRIM) 300 MG tablet Take 150 mg by mouth daily. 05/07/18  Yes [provider]  bumetanide (BUMEX) 2 MG tablet Take 4 mg by mouth See admin instructions. Take 4 mg twice daily on non dialysis days. Tuesday, Thursday, and Sunday. 12/22/20 12/22/21 Yes [provider]  calcium carbonate (TUMS - DOSED IN MG ELEMENTAL CALCIUM) 500 MG chewable tablet Chew 1 tablet by mouth daily as needed for indigestion or heartburn.   Yes [provider]  insulin lispro (HUMALOG) 100 UNIT/ML KwikPen Inject 2-24 Units into the skin See admin instructions. Sliding scale 150-200= 2 units 201-250=3 units 251-300=4 units 301-350=5 units 8 units for small meals 24 units for High carbohydrates 09/19/20  Yes [provider]  midodrine (PROAMATINE) 10 MG tablet Take 10 mg by mouth See admin instructions. Take '10mg'$  before dialysis on Monday, Wednesday, Friday, and  some Saturday depend on food retention  04/21/21  Yes [provider]  Multiple Vitamin (MULTIVITAMIN) tablet Take 1 tablet by mouth daily.   Yes [provider]  mycophenolate (CELLCEPT) 250 MG capsule Take 4 capsules (1,000 mg total) by mouth in the morning and at bedtime. 12/03/20  Yes [name], MD  TART CHERRY PO Take 1 tablet by mouth in the morning and at bedtime.   Yes [provider]     Family History  Problem Relation Age of Onset   Cancer Mother    Diabetes Father    Heart attack Father    Schizophrenia Father    Hypertension Brother     Social History   Socioeconomic History   Marital status: Married    Spouse name: Not on file   Number of children: Not on file   Years of education: Not on file   Highest education level: Not on file  Occupational History   Not on file  Tobacco Use   Smoking status: Never   Smokeless tobacco: Never  Vaping Use   Vaping Use: Never used  Substance and Sexual Activity   Alcohol use: Yes    Alcohol/week: 0.0 standard drinks of alcohol    Comment: ocassional   Drug  use: Not Currently   Sexual activity: Not on file  Other Topics Concern   Not on file  Social History Narrative   Not on file   Social Determinants of Health   Financial Resource Strain: Not on file  Food Insecurity: Not on file  Transportation Needs: Not on file  Physical Activity: Not on file  Stress: Not on file  Social Connections: Not on file     Review of Systems: A 12 point ROS discussed and pertinent positives are indicated in the HPI above.  All other systems are negative.  Vital Signs: BP 114/64 (BP Location: Right Wrist)   Pulse 99   Temp (!) 97.4 F (36.3 C) (Oral)   Resp (!) 22   Ht '5\' 10"'$  (1.778 m)   Wt 194 lb 0.1 oz (88 kg)   SpO2 98%   BMI 27.84 kg/m    Physical Exam Vitals reviewed.  Constitutional:      General: He is not in acute distress. HENT:     Head: Normocephalic.  Cardiovascular:     Rate and Rhythm: Normal rate and  regular rhythm.     Heart sounds: Normal heart sounds.  Pulmonary:     Breath sounds: Normal breath sounds.     Comments: On O2 via [place] Abdominal:     General: Bowel sounds are normal.     Comments: Abdomen is distended  Skin:    General: Skin is warm and dry.     Coloration: Skin is not cyanotic or pale.  Neurological:     Mental Status: He is alert and oriented to person, place, and time.  Psychiatric:        Mood and Affect: Mood normal.        Behavior: Behavior normal.     MD Evaluation Airway: WNL Heart: WNL Abdomen: WNL Chest/ Lungs: WNL ASA  Classification: 3 Mallampati/Airway Score: One  Imaging: US Paracentesis  Result Date: 08/24/2021 INDICATION: Patient with history of NASH cirrhosis and HCC status post liver transplant, heart failure, ESRD with recurrent ascites. Request for therapeutic paracentesis. EXAM: ULTRASOUND GUIDED  PARACENTESIS MEDICATIONS: 7 mL 1% lidocaine COMPLICATIONS: None immediate. PROCEDURE: Informed written consent was obtained from the patient after a discussion of the risks, benefits and alternatives to treatment. A timeout was performed prior to the initiation of the procedure. Initial ultrasound scanning demonstrates a moderate amount of ascites within the left lower abdominal quadrant. The left lower abdomen was prepped and draped in the usual sterile fashion. 1% lidocaine was used for local anesthesia. Following this, a 19 gauge, 7-cm, Yueh catheter was introduced. An ultrasound image was saved for documentation purposes. The paracentesis was performed. The catheter was removed and a dressing was applied. The patient tolerated the procedure well without immediate post procedural complication. FINDINGS: A total of approximately 2.4 L of hazy amber fluid was removed. IMPRESSION: Successful ultrasound-guided paracentesis yielding 2.4 liters of peritoneal fluid. PLAN: The patient has required >/=2 paracenteses in a 30 day period and a formal evaluation  by the [facility] has been arranged. Read by: Durenda Guthrie, PA-C Electronically Signed   By: Albin Felling M.D.   On: 08/24/2021 08:09   CT CHEST ABDOMEN PELVIS WO CONTRAST  Result Date: 08/19/2021 CLINICAL DATA:  Tachycardia. EXAM: CT CHEST, ABDOMEN AND PELVIS WITHOUT CONTRAST TECHNIQUE: Multidetector CT imaging of the chest, abdomen and pelvis was performed following the standard protocol without IV contrast. RADIATION DOSE REDUCTION: This exam was performed according to  the departmental dose-optimization program which includes automated exposure control, adjustment of the mA and/or kV according to patient size and/or use of iterative reconstruction technique. COMPARISON:  Chest CT 12/05/2020.  Abdominal CT 09/18/2007 FINDINGS: CT CHEST FINDINGS Cardiovascular: Enlarged heart. No pericardial effusion. No acute vascular finding. Prominent size of peripheral pulmonary arteries implying pulmonary hypertension although the main pulmonary artery does not appear particularly enlarged. Dialysis catheter with tip at the upper right atrium. Mediastinum/Nodes: Negative for adenopathy or mass. Lungs/Pleura: There is no edema, consolidation, effusion, or pneumothorax. Musculoskeletal: Bridging thoracic osteophytes CT ABDOMEN PELVIS FINDINGS Hepatobiliary: Lobulation of the transplanted liver surface. No acute or focal finding.No evidence of biliary obstruction or stone. Pancreas: Unremarkable. Spleen: Chronic splenic enlargement, likely from portal hypertension. Adrenals/Urinary Tract: Negative adrenals. No hydronephrosis or stone. Isodense 19 mm nodule from the upper pole right kidney. 2.4 cm isodense nodule from the lower pole left kidney. Simple and proteinaceous cysts were noted on abdominal MRI from earlier this year noted in care everywhere. Unremarkable bladder. Stomach/Bowel: Colonic diverticulosis. No evidence of bowel obstruction or visible inflammation  Vascular/Lymphatic: Atheromatous calcifications. Dilated IVC and pelvic veins. Aneurysmal enlargement of the left common iliac artery to 3.5 cm diameter, unchanged from abdominal MRI report 04/03/2021 at [facility]. There is indistinct soft tissue density along the left pelvic sidewall measuring up to 4.4 cm, it is unclear if this is adenopathy or vessel. The density and discrete appearance is not suggestive of hematoma. No generalized adenopathy. Reproductive:No pathologic findings. Other: Small volume ascites. Musculoskeletal: No acute finding.   L2-S1 lumbar fusion. IMPRESSION: 1. Indeterminate soft tissue density along the left pelvic sidewall which could be adenopathy or abnormal vessel. Postcontrast imaging is recommended when clinically possible. 2. No acute finding in the chest.  Chronic cardiomegaly. 3. Transplanted liver. Electronically Signed   By: Jorje Guild M.D.   On: 08/19/2021 06:02   DG Chest Port 1 View  Result Date: 08/19/2021 CLINICAL DATA:  Shortness of breath. EXAM: PORTABLE CHEST 1 VIEW COMPARISON:  07/16/2021 FINDINGS: The heart is enlarged the mediastinal contour is stable. The pulmonary vasculature is distended. Mild airspace disease is present bilaterally. A stable right internal jugular central venous catheter is noted. No effusion or pneumothorax. No acute osseous abnormality. IMPRESSION: Cardiomegaly with pulmonary vascular congestion. Mild airspace disease is noted bilaterally, possible edema or infiltrate. Electronically Signed   By: Brett Fairy M.D.   On: 08/19/2021 02:59   IR Paracentesis  Result Date: 08/09/2021 INDICATION: Patient with history of hepatocellular carcinoma, NASH cirrhosis status post liver transplant with recurrent ascites. Request to IR for therapeutic paracentesis. EXAM: ULTRASOUND GUIDED THERAPEUTIC PARACENTESIS MEDICATIONS: 7 mL 1% lidocaine COMPLICATIONS: None immediate. PROCEDURE: Informed written consent was obtained from the patient after a discussion  of the risks, benefits and alternatives to treatment. A timeout was performed prior to the initiation of the procedure. Initial ultrasound scanning demonstrates a large amount of ascites within the right lower abdominal quadrant. The right lower abdomen was prepped and draped in the usual sterile fashion. 1% lidocaine was used for local anesthesia. Following this, a 19 gauge, 7-cm, Yueh catheter was introduced. An ultrasound image was saved for documentation purposes. The paracentesis was performed. The catheter was removed and a dressing was applied. The patient tolerated the procedure well without immediate post procedural complication. FINDINGS: A total of approximately 3.8 L of hazy orange fluid was removed. IMPRESSION: Successful ultrasound-guided paracentesis yielding 3.8 liters of peritoneal fluid. Read by Candiss Norse, PA-C Electronically Signed   By: Camillia Herter  Suttle M.D.   On: 08/09/2021 13:36   IR Paracentesis  Result Date: 08/02/2021 INDICATION: Patient with a history of hepatocellular carcinoma and NASH cirrhosis status post liver transplant with recurrent ascites related to cardiorenal syndrome. Request for therapeutic paracentesis up to 3 L max EXAM: ULTRASOUND GUIDED PARACENTESIS MEDICATIONS: 1% lidocaine 10 mL COMPLICATIONS: None immediate. PROCEDURE: Informed written consent was obtained from the patient after a discussion of the risks, benefits and alternatives to treatment. A timeout was performed prior to the initiation of the procedure. Initial ultrasound scanning demonstrates a large amount of ascites within the right lower abdominal quadrant. The right lower abdomen was prepped and draped in the usual sterile fashion. 1% lidocaine was used for local anesthesia. Following this, a 19 gauge, 7-cm, Yueh catheter was introduced. An ultrasound image was saved for documentation purposes. The paracentesis was performed. The catheter was removed and a dressing was applied. The patient tolerated  the procedure well without immediate post procedural complication. FINDINGS: A total of approximately 3 L of light red fluid was removed. IMPRESSION: Successful ultrasound-guided paracentesis yielding 3 liters of peritoneal fluid. Read by: Soyla Dryer, NP Electronically Signed   By: Jerilynn Mages.  Shick M.D.   On: 08/02/2021 13:39   IR Paracentesis  Result Date: 07/30/2021 INDICATION: Patient with a history of hepatocellular carcinoma and NASH cirrhosis status post liver transplant with recurrent ascites related to cardiorenal syndrome. Request for therapeutic paracentesis up to 3 L max. EXAM: ULTRASOUND GUIDED PARACENTESIS MEDICATIONS: 1% lidocaine 10 mL COMPLICATIONS: None immediate. PROCEDURE: Informed written consent was obtained from the patient after a discussion of the risks, benefits and alternatives to treatment. A timeout was performed prior to the initiation of the procedure. Initial ultrasound scanning demonstrates a large amount of ascites within the left lower abdominal quadrant. The left lower abdomen was prepped and draped in the usual sterile fashion. 1% lidocaine was used for local anesthesia. Following this, a 19 gauge, 7-cm, Yueh catheter was introduced. An ultrasound image was saved for documentation purposes. The paracentesis was performed. The catheter was removed and a dressing was applied. The patient tolerated the procedure well without immediate post procedural complication. FINDINGS: A total of approximately 3 L of red fluid was removed. IMPRESSION: Successful ultrasound-guided paracentesis yielding 3 liters of peritoneal fluid. Read by: Soyla Dryer, NP Electronically Signed   By: Jacqulynn Cadet M.D.   On: 07/30/2021 16:02    Labs:  CBC: Recent Labs    08/20/21 0204 08/21/21 0133 08/23/21 0403 08/24/21 0855  WBC 7.3 7.0 8.2 6.6  HGB 11.4* 11.2* 12.1* 10.9*  HCT 35.9* 34.3* 37.4* 33.9*  PLT 135* 123* 127* 131*    COAGS: Recent Labs    11/22/20 0622 12/06/20 0707  08/19/21 0226  INR 1.2 1.3* 1.0    BMP: Recent Labs    08/22/21 0226 08/23/21 0403 08/24/21 0452 08/26/21 0242  NA 129* 126*  128* 129* 126*  K 3.6 4.8  4.9 4.7 4.7  CL 90* 88*  88* 90* 89*  CO2 24 22  21* 24 20*  GLUCOSE 192* 126*  130* 106* 131*  BUN 19 42*  42* 31* 36*  CALCIUM 7.7* 8.1*  8.2* 8.5* 8.3*  CREATININE 3.07* 5.24*  5.05* 3.83* 4.05*  GFRNONAA 21* 11*  12* 16* 15*    LIVER FUNCTION TESTS: Recent Labs    12/16/20 0409 06/01/21 0217 06/02/21 0853 08/19/21 0226 08/20/21 0204 08/23/21 0403  BILITOT 0.9 0.9  --  0.5 0.7  --   AST 28  20  --  27 23  --   ALT 28 16  --  22 17  --   ALKPHOS 58 114  --  134* 114  --   PROT 6.3* 6.6  --  5.2* 4.9*  --   ALBUMIN 4.2 4.0 4.1 3.1* 3.1* 3.3*    TUMOR MARKERS: No results for input(s): "AFPTM", "CEA", "CA199", "CHROMGRNA" in the last 8760 hours.  Assessment and Plan: 66 y.o. male with heart failure, pulmonary hypertension, cirrhosis, hepatocellular cancer sp liver transplant in 2013, NASH, ESRD and recurrent ascites who is in need of peritoneal tunneled catheter to manage the recurrent ascites.   Case was reviewed and approved by [name].   CBC to be obtained tomorrow AM, last patient had no leukocytosis or fever during this admission.  VSS - pt receives midodrine when he receives dialysis.  INR 1.0 on 7/6  On sq heparin q8h, no need for d/c.   Risks and benefits of tunneled peritoneal catheter placement were discussed with the patient including bleeding, infection, damage to adjacent structures, malfunction of the tube requiring additional procedures, sepsis and even death.   All of the patient's questions were answered, patient is agreeable to proceed. Consent signed and in chart.   The procedure is scheduled for tomorrow pending IR schedule.   PLAN - NPO at [facility] - 2 g Ancef ordered, to be given in IR   Thank you for this interesting consult.  I greatly enjoyed meeting DYSEN EDMONDSON and  look forward to participating in their care.  A copy of this report was sent to the requesting provider on this date.  Electronically Signed: Tera Mater, PA-C 08/26/2021, 3:44 PM   I spent a total of 40 Minutes    in face to face in clinical consultation, greater than 50% of which was counseling/coordinating care for peritoneal PleurX catheter placement.   This chart was dictated using voice recognition software.  Despite best efforts to proofread,  errors can occur which can change the documentation meaning.

## 2021-08-26 NOTE — Progress Notes (Signed)
Mobility Specialist Progress Note:   08/26/21 0942  Mobility  Activity Off unit   Pt in HD. Will follow-up as time allows.   [facility] Edita Weyenberg Mobility Specialist

## 2021-08-26 NOTE — Progress Notes (Signed)
Progress Note  Patient Name: Gregory Tanner Date of Encounter: 08/26/2021  [facility] Cardiologist: Shirlee More, MD   Subjective   Hoping to get pleur X catheter for ascites drainage and not just another paracentesis today On oxygen due to fluid re accumulation   Inpatient Medications    Scheduled Meds:  allopurinol  150 mg Oral Daily   amiodarone  200 mg Oral BID   Followed by   Derrill Memo ON 09/09/2021] amiodarone  200 mg Oral Daily   bumetanide  4 mg Oral Once per day on Sun Tue Thu   Chlorhexidine Gluconate Cloth  6 each Topical Q0600   docusate sodium  100 mg Oral BID   heparin  5,000 Units Subcutaneous Q8H   insulin aspart  0-6 Units Subcutaneous TID WC   midodrine  10 mg Oral 2 times per day on Mon Wed Fri Sat   mycophenolate  1,000 mg Oral BID   sodium chloride flush  3 mL Intravenous Q12H   Continuous Infusions:   PRN Meds: acetaminophen **OR** acetaminophen, alteplase, alum & mag hydroxide-simeth, bisacodyl, cetaphil, heparin, hydrALAZINE, lidocaine (PF), lidocaine-prilocaine, ondansetron **OR** ondansetron (ZOFRAN) IV, oxyCODONE, pentafluoroprop-tetrafluoroeth, polyethylene glycol   Vital Signs    Vitals:   08/25/21 1200 08/25/21 1230 08/25/21 2134 08/26/21 0430  BP: 91/75 120/60 122/72 113/69  Pulse: (!) 101 71 (!) 102 98  Resp: (!) '22 20 17 '$ (!) 23  Temp:  98.1 F (36.7 C) (!) 97.5 F (36.4 C) 97.8 F (36.6 C)  TempSrc:   Oral Oral  SpO2: 100% 100% 98% 100%  Weight:  86.2 kg  87.8 kg  Height:        Intake/Output Summary (Last 24 hours) at 08/26/2021 0835 Last data filed at 08/26/2021 5643 Gross per 24 hour  Intake 1012 ml  Output 3500 ml  Net -2488 ml      08/26/2021    4:30 AM 08/25/2021   12:30 PM 08/25/2021    5:09 AM  Last 3 Weights  Weight (lbs) 193 lb 9 oz 190 lb 0.6 oz 194 lb 14.4 oz  Weight (kg) 87.8 kg 86.2 kg 88.406 kg      Telemetry    Atrial flutter 2:1, HR in the 90s-100s - Personally Reviewed  ECG    No new tracings  since 7/7 - Personally Reviewed  Physical Exam   Chronically ill JVP elevated Lungs clear TR murmur  Ascites with distended abdomen Plus 3 edema with legs wrapped   Labs    High Sensitivity Troponin:  No results for input(s): "TROPONINIHS" in the last 720 hours.   Chemistry Recent Labs  Lab 08/20/21 0204 08/21/21 0133 08/23/21 0403 08/24/21 0452 08/26/21 0242  NA 126*   < > 126*  128* 129* 126*  K 5.1   < > 4.8  4.9 4.7 4.7  CL 94*   < > 88*  88* 90* 89*  CO2 20*   < > 22  21* 24 20*  GLUCOSE 110*   < > 126*  130* 106* 131*  BUN 37*   < > 42*  42* 31* 36*  CREATININE 3.91*   < > 5.24*  5.05* 3.83* 4.05*  CALCIUM 7.9*   < > 8.1*  8.2* 8.5* 8.3*  PROT 4.9*  --   --   --   --   ALBUMIN 3.1*  --  3.3*  --   --   AST 23  --   --   --   --  ALT 17  --   --   --   --   ALKPHOS 114  --   --   --   --   BILITOT 0.7  --   --   --   --   GFRNONAA 16*   < > 11*  12* 16* 15*  ANIONGAP 12   < > 16*  19* 15 17*   < > = values in this interval not displayed.    Lipids No results for input(s): "CHOL", "TRIG", "HDL", "LABVLDL", "LDLCALC", "CHOLHDL" in the last 168 hours.  Hematology Recent Labs  Lab 08/21/21 0133 08/23/21 0403 08/24/21 0855  WBC 7.0 8.2 6.6  RBC 3.63* 3.85* 3.53*  HGB 11.2* 12.1* 10.9*  HCT 34.3* 37.4* 33.9*  MCV 94.5 97.1 96.0  MCH 30.9 31.4 30.9  MCHC 32.7 32.4 32.2  RDW 15.9* 16.2* 16.0*  PLT 123* 127* 131*   Thyroid  No results for input(s): "TSH", "FREET4" in the last 168 hours.   BNP No results for input(s): "BNP", "PROBNP" in the last 168 hours.   DDimer No results for input(s): "DDIMER" in the last 168 hours.   Radiology    No results found.  Cardiac Studies     Patient Profile     66 y.o. male with history of severe pulmonary hypertension, chronic volume overload/ascites, prior liver transplant, paroxysmal atrial flutter and end-stage renal disease on hemodialysis seen for a flutter with RVR.  Assessment & Plan     Atrial flutter with rapid ventricular rate - rates ok - continue amiodarone - no anticoagulation given  goals of care/bleeding risk - No AV nodal drugs due to hypotension requiring midodrine   History of liver transplant/severe pulmonary pretension/high-output heart failure/end-stage renal disease on hemodialysis/ascites - Fluid managed with dialysis  - Trying to get IR to put pigtail/Pleur X peritoneal catheter in as he is requiring paracentesis every 3 days      Cardiology will sign off nothing additional to offer   For questions or updates, please contact [place] Please consult [email or web address] for contact info under        Signed, Jenkins Rouge, MD  08/26/2021, 8:35 AM

## 2021-08-26 NOTE — Plan of Care (Signed)

## 2021-08-26 NOTE — Progress Notes (Addendum)
Progress Note   Patient: Gregory Tanner [id number]:[id number] DOB: 1955/09/04 DOA: 08/19/2021     5 DOS: the patient was seen and examined on 08/26/2021   Brief hospital course: Gregory Tanner was admitted to the hospital with the working diagnosis of uncontrolled atrial flutter in the setting of volume overload.   66 yo male with the past medical history of diastolic heart failure, pulmonary hypertension, cirrhosis, hepatocellular cancer sp liver transplant, NASH, and ESRD who presented with rapid heart rate. Patient noted easy fatigability at home with minimal efforts. EMS was called and found with HR 234 bpm, with worsening dyspnea. His blood pressure 118/68, HR 110, RR 23 and 02 saturation 98%. Lungs with no rales or wheezing, increased work of breathing, heart with S1 and S2 present and tachycardic, abdomen distended and positive lower extremity edema.   Na 133 K 5.0 CL 93, bicarbonate 24 glucose 123 bun 22 cr 3,0  BNP 1,348  Wbc 7,7 hgb 11,8 plt 149   Chest radiograph with cardiomegaly, mild vascular congestion, HD cathter in the right IJ. Chest CT with no acute changes.  Left pelvic sidewall indeterminate soft tissue.   EKG 144 bpm, normal axis, normal qtc, sinus rhythm with PAC (bigeminy pattern), no significant ST segment or T wave changes.   Patient received adenosin in the ed.  Underlying rhythm atrial flutter.   IR consulted for paracentesis  Getting aggressive ultrafiltration per HD.   Plan to place a peritoneal cathter for drainage as outpatient.  I spoke with Dr Helene Kelp, patient's primary care and she is Ok in following catheter as outpatient.   Assessment and Plan: * SVT (supraventricular tachycardia) (HCC) Continue rate control with amiodarone.    ESRD on dialysis ([place]) Hyponatremia.   Patient with severe volume overload.  Currently getting UF every day per nephrology recommendations.   Na is 126, K 4,7 and serum bicarbonate at 20.  Plan to continue with HD and  ultrafiltration per nephrology recommendations.  He has been getting daily treatments.  Bumetanide on non HD days.    Acute on chronic diastolic CHF (congestive heart failure) (HCC) Echocardiogram (TEE)from 11/2020 with preserved LV systolic function EF 60 to 65%, D shaped septum, RV systolic function with mild reduction. Moderate TR.   Pulmonary hypertension with acute on chronic core pulmonale.   Continue ultrafiltration on hemodialysis Edema is improving but not back to baseline.  Continue with midodrine for blood pressure support.      History of liver transplant ([place]) Cirrhosis, NASH.  Patient with recurrent ascites. Requiring frequent paracentesis.   I spoke with Dr Helene Kelp and she will follow peritoneal catheter as outpatient.  Plan for IR to place catheter in am. Will get supplies and home health agency to follow patient as outpatient.    Continue with mycophenolate.   Type 2 diabetes mellitus (HCC) Continue glucose cover and monitoring with insulin sliding scale.  Patient is tolerating po well.   Hypotension Continue blood pressure support with midodrine.         Subjective: Patient had HD today with improvement in his edema but not back to baseline, continue to worsen abdominal distention. No nausea or vomiting, no chest pain, positive dyspnea.   Physical Exam: Vitals:   08/26/21 1200 08/26/21 1246 08/26/21 1247 08/26/21 1322  BP: 103/63 111/65 111/65 114/64  Pulse: 99 99 99 99  Resp: 20 (!) 23 (!) 23 (!) 22  Temp:  (!) 97.5 F (36.4 C) (!) 97.5 F (36.4 C) (!) 97.4 F (36.3  C)  TempSrc:    Oral  SpO2: 100% 100% 100% 98%  Weight:      Height:       Neurology awake and alert ENT with no pallor Cardiovascular with S1 and S2 present, no gallops, positive tachycardia No JVD Positive lower extremity edema ++ pitting up to the thighs bilaterally Respiratory with rales at bases Abdomen distended, positive ascites.  Data Reviewed:    Family  Communication: I spoke with patient's wife at the bedside, we talked in detail about patient's condition, plan of care and prognosis and all questions were addressed.   Disposition: Status is: Inpatient Remains inpatient appropriate because: pending fluids removal on HD and paracentesis   Planned Discharge Destination: Home     Author: Tawni Millers, MD 08/26/2021 2:23 PM  For on call review [email or web address].

## 2021-08-26 NOTE — Plan of Care (Signed)
  Problem: Health Behavior/Discharge Planning: Goal: Ability to manage health-related needs will improve Outcome: Progressing   Problem: Clinical Measurements: Goal: Will remain free from infection Outcome: Progressing   Problem: Activity: Goal: Risk for activity intolerance will decrease Outcome: Progressing   

## 2021-08-26 NOTE — Progress Notes (Signed)
Mobility Specialist Progress Note:   08/26/21 1608  Mobility  Activity Ambulated with assistance in hallway  Level of Assistance Standby assist, set-up cues, supervision of patient - no hands on  Assistive Device None  Distance Ambulated (ft) 250 ft  Activity Response Tolerated well  $Mobility charge 1 Mobility   Pt received in chair willing to participate in mobility. No complaints of pain. Left in chair with call bell in reach and all needs met.   [facility] Wardell Pokorski Mobility Specialist

## 2021-08-27 ENCOUNTER — Inpatient Hospital Stay (HOSPITAL_COMMUNITY): Payer: Medicare Other

## 2021-08-27 DIAGNOSIS — I471 Supraventricular tachycardia: Secondary | ICD-10-CM | POA: Diagnosis not present

## 2021-08-27 DIAGNOSIS — I5033 Acute on chronic diastolic (congestive) heart failure: Secondary | ICD-10-CM | POA: Diagnosis not present

## 2021-08-27 DIAGNOSIS — N186 End stage renal disease: Secondary | ICD-10-CM | POA: Diagnosis not present

## 2021-08-27 DIAGNOSIS — Z944 Liver transplant status: Secondary | ICD-10-CM | POA: Diagnosis not present

## 2021-08-27 HISTORY — PX: IR PERC PLEURAL DRAIN W/INDWELL CATH W/IMG GUIDE: IMG5383

## 2021-08-27 LAB — CBC WITH DIFFERENTIAL/PLATELET
Abs Immature Granulocytes: 0.22 10*3/uL — ABNORMAL HIGH (ref 0.00–0.07)
Basophils Absolute: 0.1 10*3/uL (ref 0.0–0.1)
Basophils Relative: 1 %
Eosinophils Absolute: 0.1 10*3/uL (ref 0.0–0.5)
Eosinophils Relative: 1 %
HCT: 35.3 % — ABNORMAL LOW (ref 39.0–52.0)
Hemoglobin: 11.4 g/dL — ABNORMAL LOW (ref 13.0–17.0)
Immature Granulocytes: 3 %
Lymphocytes Relative: 10 %
Lymphs Abs: 0.7 10*3/uL (ref 0.7–4.0)
MCH: 31.4 pg (ref 26.0–34.0)
MCHC: 32.3 g/dL (ref 30.0–36.0)
MCV: 97.2 fL (ref 80.0–100.0)
Monocytes Absolute: 0.7 10*3/uL (ref 0.1–1.0)
Monocytes Relative: 10 %
Neutro Abs: 5.2 10*3/uL (ref 1.7–7.7)
Neutrophils Relative %: 75 %
Platelets: 117 10*3/uL — ABNORMAL LOW (ref 150–400)
RBC: 3.63 MIL/uL — ABNORMAL LOW (ref 4.22–5.81)
RDW: 16.1 % — ABNORMAL HIGH (ref 11.5–15.5)
WBC: 6.9 10*3/uL (ref 4.0–10.5)
nRBC: 0 % (ref 0.0–0.2)

## 2021-08-27 LAB — RENAL FUNCTION PANEL
Albumin: 4 g/dL (ref 3.5–5.0)
Anion gap: 11 (ref 5–15)
BUN: 32 mg/dL — ABNORMAL HIGH (ref 8–23)
CO2: 27 mmol/L (ref 22–32)
Calcium: 8.5 mg/dL — ABNORMAL LOW (ref 8.9–10.3)
Chloride: 90 mmol/L — ABNORMAL LOW (ref 98–111)
Creatinine, Ser: 3.65 mg/dL — ABNORMAL HIGH (ref 0.61–1.24)
GFR, Estimated: 17 mL/min — ABNORMAL LOW (ref >60)
Glucose, Bld: 112 mg/dL — ABNORMAL HIGH (ref 70–99)
Phosphorus: 4.3 mg/dL (ref 2.5–4.6)
Potassium: 4.9 mmol/L (ref 3.5–5.1)
Sodium: 128 mmol/L — ABNORMAL LOW (ref 135–145)

## 2021-08-27 LAB — PROTIME-INR
INR: 1.1 (ref 0.8–1.2)
Prothrombin Time: 14.1 seconds (ref 11.4–15.2)

## 2021-08-27 LAB — GLUCOSE, CAPILLARY
Glucose-Capillary: 114 mg/dL — ABNORMAL HIGH (ref 70–99)
Glucose-Capillary: 179 mg/dL — ABNORMAL HIGH (ref 70–99)
Glucose-Capillary: 115 mg/dL — ABNORMAL HIGH (ref 70–99)
Glucose-Capillary: 288 mg/dL — ABNORMAL HIGH (ref 70–99)

## 2021-08-27 MED ORDER — MIDAZOLAM HCL 2 MG/2ML IJ SOLN
INTRAMUSCULAR | Status: AC | PRN
  Administered 2021-08-27: 1 mg via INTRAVENOUS

## 2021-08-27 MED ORDER — LIDOCAINE HCL (PF) 1 % IJ SOLN
INTRAMUSCULAR | Status: AC | PRN
  Administered 2021-08-27: 20 mL

## 2021-08-27 MED ORDER — LIDOCAINE HCL 1 % IJ SOLN
INTRAMUSCULAR | Status: AC
  Filled 2021-08-27: qty 20

## 2021-08-27 MED ORDER — CEFAZOLIN SODIUM-DEXTROSE 2-4 GM/100ML-% IV SOLN
INTRAVENOUS | Status: AC
  Administered 2021-08-27: 2 g via INTRAVENOUS
  Filled 2021-08-27: qty 100

## 2021-08-27 MED ORDER — MIDAZOLAM HCL 2 MG/2ML IJ SOLN
INTRAMUSCULAR | Status: AC
  Filled 2021-08-27: qty 2

## 2021-08-27 MED ORDER — CHLORHEXIDINE GLUCONATE CLOTH 2 % EX PADS
6.0000 | MEDICATED_PAD | Freq: Every day | CUTANEOUS | Status: DC
  Administered 2021-08-28: 6 via TOPICAL

## 2021-08-27 MED ORDER — FENTANYL CITRATE (PF) 100 MCG/2ML IJ SOLN
INTRAMUSCULAR | Status: AC
  Filled 2021-08-27: qty 2

## 2021-08-27 MED ORDER — FENTANYL CITRATE (PF) 100 MCG/2ML IJ SOLN
INTRAMUSCULAR | Status: AC | PRN
  Administered 2021-08-27: 50 ug via INTRAVENOUS

## 2021-08-27 NOTE — Progress Notes (Signed)
[employer] form sent in pt chart so pt can get supplies needed for abdominal pleurx care at home.

## 2021-08-27 NOTE — Procedures (Addendum)
Interventional Radiology Procedure Note  Procedure: Image guided drain placement, peritoneal tunneled externalized catheter.    Complications: None  EBL: None    Recommendations: - Routine pleurex care - care education - routine wound care - may restart AC as needed  Signed,  Dulcy Fanny. Earleen Newport, DO

## 2021-08-27 NOTE — Progress Notes (Signed)
Received patient in bed, alert and oriented. Informed consent signed and in chart.  Time tx completed:3.5 hrs  HD treatment completed. Patient tolerated well. Fistula/Graft/HD catheter without signs and symptoms of complications. Patient transported back to the room, alert and orient and in no acute distress. Report given to bedside RN.  Total UF removed: 4000 mls  Medication given: Midodrine 10 mg  Post HD VS: See chart  Post HD weight: 79.8kg  Completed prescribed tx, BP stable throughout.

## 2021-08-27 NOTE — Sedation Documentation (Signed)
Patient is resting comfortably. VSS °

## 2021-08-27 NOTE — Sedation Documentation (Signed)
Patient is resting comfortably. 

## 2021-08-27 NOTE — Progress Notes (Signed)
Subjective: Sitting in bedside chair.  Wife present in room.  No complaints for dialysis today, earlier today IR inserted tunneled peritoneal catheter for drainage(paracentesis in home setting). 2/2 excess volume patient agrees for tomorrow 3-hour UF in addition to today's regular HD  Objective Vital signs in last 24 hours: Vitals:   08/27/21 0828 08/27/21 0834 08/27/21 0853 08/27/21 1127  BP: 94/66 96/77 100/65 (!) 100/59  Pulse: 93 96 92 97  Resp: '16 16 17 16  '$ Temp:   (!) 97.5 F (36.4 C) (!) 97.4 F (36.3 C)  TempSrc:   Oral Oral  SpO2: 99% 99% 100% 99%  Weight:      Height:       Weight change: 1.8 kg  Physical Exam: General: Chronically-ill appearing adult male NAD; On 2L [facility]: RRR no MRG  Lungs: Diminished laterally and clear anteriorly Abdomen: NABS, distended with ascites nontender Extremities:  bilat. LE edema improving, continue ace wraps to LEs Dialysis Access: Right IJ [facility]   Dialysis Orders: MWF at [facility] 4hr, 400/800, EDW 84.5 (doesn't typically reach, post wt 7/5 was 88.8kg), TDC, heparin 4000 unit bolus + 2500 unit mid-run bolus - Calcitriol 0.67mg PO q HD - no ESA   Problem/Plan: SVT: S/p adenosine x 2 on admit.  SR on exam today ,cardiology feels he is in A flutter with 2:1 and started on po amiodarone. Acute on chronic diastolic CHF HCC: Today IR  placed PleurX for  Recommend HD 4 days per week.  As outpatient as as needed Saturday UF will continue.  Tomorrow 3 hours UF in addition to today's HD time.  Noted patient's abdominal Pleurx device will be monitored managed by his primary care with outpatient RN follow-up  as per his wife for today and noted by admitting team ESRD:  Continue HD usual MWF schedule - Given volume overload, SVT, and hypotension, will plan for HD 4 days per week in outpatient to help manage volume.   Hyponatremia likely d/t hypervolemia.  Rx on HD  Hypotension/volume: Chronic hypotension, uses midodrine pre-HD, may need to consider  daily use. Chronic anasarca. Tells me pulls 5-5.5L w/ OP HD. Although inpatient machines limit UF to 1 L per hr, so 3-4 L max w/ each Rx. Will need daily HD while here most likely to get some of this extra volume off.  Ascites: paracentesis now to be managed with a Pleurx abdominal apparatus IR inserted today /noted per admit team =spoke with Dr HHelene Kelp patient's primary care and she is Ok in follow ing catheter as outpatient  Anemia: Hgb 11.4 no ESA, monitor trend closely.  Metabolic bone disease: Ca ok, last PO4 ok.   Nutrition:  Alb 4.0 will add supplement if gets lower Hx liver transplant 2013: Functioning per patient, continue IS regimen.  Disposition : poor overall prognosis and situation.refusing hospice and wants to continue with hemodialysis at this time. Palliative has little to offer given current situation. Continue goals of care discussion.  DErnest Haber PA-C [name] Beeper [phone]/14/2023,2:14 PM  LOS: 6 days   Labs: Basic Metabolic Panel: Recent Labs  Lab 08/23/21 0403 08/24/21 0452 08/26/21 0242 08/27/21 0442  NA 126*  128* 129* 126* 128*  K 4.8  4.9 4.7 4.7 4.9  CL 88*  88* 90* 89* 90*  CO2 22  21* 24 20* 27  GLUCOSE 126*  130* 106* 131* 112*  BUN 42*  42* 31* 36* 32*  CREATININE 5.24*  5.05* 3.83* 4.05* 3.65*  CALCIUM 8.1*  8.2* 8.5* 8.3* 8.5*  PHOS 5.5*  --   --  4.3   Liver Function Tests: Recent Labs  Lab 08/23/21 0403 08/27/21 0442  ALBUMIN 3.3* 4.0   No results for input(s): "LIPASE", "AMYLASE" in the last 168 hours. No results for input(s): "AMMONIA" in the last 168 hours. CBC: Recent Labs  Lab 08/21/21 0133 08/23/21 0403 08/24/21 0855 08/27/21 0442  WBC 7.0 8.2 6.6 6.9  NEUTROABS  --   --   --  5.2  HGB 11.2* 12.1* 10.9* 11.4*  HCT 34.3* 37.4* 33.9* 35.3*  MCV 94.5 97.1 96.0 97.2  PLT 123* 127* 131* 117*   Cardiac Enzymes: No results for input(s): "CKTOTAL", "CKMB", "CKMBINDEX", "TROPONINI" in the last 168  hours. CBG: Recent Labs  Lab 08/26/21 1331 08/26/21 1631 08/26/21 2119 08/27/21 0617 08/27/21 1113  GLUCAP 94 139* 115* 114* 179*    Studies/Results: IR PERC PLEURAL DRAIN W/INDWELL CATH W/IMG GUIDE  Result Date: 08/27/2021 INDICATION: 66 year old male referred for a tunneled peritoneal catheter EXAM: IMAGE GUIDED TUNNELED PERITONEAL CATHETER MEDICATIONS: The patient is currently admitted to the hospital and receiving intravenous antibiotics. The antibiotics were administered within an appropriate time frame prior to the initiation of the procedure. ANESTHESIA/SEDATION: Fentanyl 1.0 mcg IV; Versed 50 mg IV Moderate Sedation Time:  10 minutes The patient was continuously monitored during the procedure by the interventional radiology nurse under my direct supervision. COMPLICATIONS: None PROCEDURE: The procedure, risks, benefits, and alternatives were explained to the patient and the patient's family. Specific risks that were addressed included bleeding, infection, need for further procedure, chance of delayed hemorrhage, cardiopulmonary collapse, death. Questions regarding the procedure were encouraged and answered. The patient understands and consents to the procedure. The right abdominal wall was prepped with Betadine in a sterile fashion, and a sterile drape was applied covering the operative field. A sterile gown and sterile gloves were used for the procedure. Local anesthesia was provided with 1% Lidocaine. Ultrasound image documentation was performed. After creating a small skin incision, a 19 gauge needle was advanced into the peritoneal cavity under ultrasound guidance. A guide wire was then advanced under fluoroscopy into the space. Access was dilated serially and a 16-French peel-away sheath placed. The skin and subcutaneous tissues were generously infiltrated with 1% lidocaine from the puncture site along the abdomen anteriorly. A small stab incision was made with 11 blade scalpel at the  insertion site of the catheter, and the catheter was back tunneled to the site at the puncture. A tunneled peritoneal catheter was placed. This was tunneled from the incision 5 cm anterior to the access to the access site. The catheter was advanced through the peel-away sheath. The sheath was then removed. Final catheter positioning was confirmed with a fluoroscopic spot image. The access incision was closed with Derma bond. Dermabond was applied to the catheterization incision. Large volume paracentesis was performed through the new catheter utilizing vacuum containers. The patient tolerated the procedure well and remained hemodynamically stable throughout. No complications were encountered and no significant blood loss was encountered. IMPRESSION: Status post image guided tunneled peritoneal drainage catheter. Signed, Dulcy Fanny. Nadene Rubins, RPVI [facility] Inter[facility] Radiology Special[facility] Radiology Electronically Signed   By: Corrie Mckusick D.O.   On: 08/27/2021 09:22   Medications:   allopurinol  150 mg Oral Daily   amiodarone  200 mg Oral BID   Followed by   Derrill Memo ON 09/09/2021] amiodarone  200 mg Oral Daily   bumetanide  4 mg Oral  Once per day on Sun Tue Thu   Chlorhexidine Gluconate Cloth  6 each Topical Q0600   docusate sodium  100 mg Oral BID   heparin  5,000 Units Subcutaneous Q8H   insulin aspart  0-6 Units Subcutaneous TID WC   midodrine  10 mg Oral 2 times per day on Mon Wed Fri Sat   midodrine  10 mg Oral Once in dialysis   mycophenolate  1,000 mg Oral BID   sodium chloride flush  3 mL Intravenous Q12H

## 2021-08-27 NOTE — Progress Notes (Signed)
Progress Note   Patient: Gregory Tanner [id number]:[id number] DOB: April 20, 1955 DOA: 08/19/2021     6 DOS: the patient was seen and examined on 08/27/2021   Brief hospital course: Gregory Tanner was admitted to the hospital with the working diagnosis of uncontrolled atrial flutter in the setting of volume overload.   66 yo male with the past medical history of diastolic heart failure, pulmonary hypertension, cirrhosis, hepatocellular cancer sp liver transplant, NASH, and ESRD who presented with rapid heart rate. Patient noted easy fatigability at home with minimal efforts. EMS was called and found with HR 234 bpm, with worsening dyspnea. His blood pressure 118/68, HR 110, RR 23 and 02 saturation 98%. Lungs with no rales or wheezing, increased work of breathing, heart with S1 and S2 present and tachycardic, abdomen distended and positive lower extremity edema.   Na 133 K 5.0 CL 93, bicarbonate 24 glucose 123 bun 22 cr 3,0  BNP 1,348  Wbc 7,7 hgb 11,8 plt 149   Chest radiograph with cardiomegaly, mild vascular congestion, HD cathter in the right IJ. Chest CT with no acute changes.  Left pelvic sidewall indeterminate soft tissue.   EKG 144 bpm, normal axis, normal qtc, sinus rhythm with PAC (bigeminy pattern), no significant ST segment or T wave changes.   Patient received adenosin in the ed.  Underlying rhythm atrial flutter.   IR consulted for paracentesis  Getting aggressive ultrafiltration per HD.   Plan to place a peritoneal cathter for drainage as outpatient.  I spoke with Gregory Tanner, patient's primary care and she is Ok in following catheter as outpatient.   07/14 peritoneal cathter for ascitic fluid drainage has been placed.   Assessment and Plan: * SVT (supraventricular tachycardia) (HCC) Continue rate control with amiodarone.    ESRD on dialysis ([place]) Hyponatremia.   Patient with severe volume overload.  Currently getting UF every day per nephrology recommendations.   Today Na is  128, K 4,9 and serum bicarbonate 27.   Plan to continue with HD and ultrafiltration per nephrology recommendations.  He has been getting daily treatments.  Bumetanide on non HD days.   Plan to discharge patient tomorrow after HD and then continue HD as outpatient on Monday.    Acute on chronic diastolic CHF (congestive heart failure) (HCC) Echocardiogram (TEE)from 11/2020 with preserved LV systolic function EF 60 to 65%, D shaped septum, RV systolic function with mild reduction. Moderate TR.   Pulmonary hypertension with acute on chronic core pulmonale.   Continue ultrafiltration on hemodialysis Edema is improving but not back to baseline.  Continue with midodrine for blood pressure support.      History of liver transplant ([place]) Cirrhosis, NASH.  Patient with recurrent ascites. Requiring frequent paracentesis.   I spoke with Gregory Tanner and she will follow peritoneal catheter as outpatient.  Cathter has been placed and equipment being arranged for outpatient drainage.   Continue with mycophenolate.   Type 2 diabetes mellitus (HCC) Continue glucose cover and monitoring with insulin sliding scale.  Patient is tolerating po well.   Hypotension Continue blood pressure support with midodrine.         Subjective: Patient feeling better after ascitic fluid drainage, pending HD today, his dyspnea and edema are improving.   Physical Exam: Vitals:   08/27/21 0828 08/27/21 0834 08/27/21 0853 08/27/21 1127  BP: 94/66 96/77 100/65 (!) 100/59  Pulse: 93 96 92 97  Resp: '16 16 17 16  '$ Temp:   (!) 97.5 F (36.4 C) (!) 97.4  F (36.3 C)  TempSrc:   Oral Oral  SpO2: 99% 99% 100% 99%  Weight:      Height:       Neurology awake and alert ENT with mild pallor] Cardiovascular with S1 and S2 present and rhythmic Respiratory with scattered rales at bases Abdomen distended, cathter in place right side  Positive lower extremity edema ++ wraps in place,  Data Reviewed:    Family  Communication: I spoke with patient's wife at the bedside, we talked in detail about patient's condition, plan of care and prognosis and all questions were addressed.   Disposition: Status is: Inpatient Remains inpatient appropriate because: inpatient hemodialysis   Planned Discharge Destination: Home    Author: Tawni Millers, MD 08/27/2021 1:08 PM  For on call review [email or web address].

## 2021-08-27 NOTE — TOC Progression Note (Addendum)
Transition of Care [facility]) - Progression Note    Patient Details  Name: Gregory Tanner MRN: [id number] Date of Birth: 12-Sep-1955  Transition of Care [facility]) CM/SW Contact  Zenon Mayo, RN Phone Number: 08/27/2021, 11:07 AM  Clinical Narrative:    Patient is set up with [facility] for [facility] , he has pluerx drains, unit will order box of 10 drains today,  NCM filling out form for pluerx drain supplies, will need MD to fill out rest of order form. Plan is for home with [facility] when medically stable.  SOC with [facility] will be on Monday.    Expected Discharge Plan: [place] Barriers to Discharge: Continued Medical Work up  Expected Discharge Plan and Services Expected Discharge Plan: [place] In-house Referral: Clinical Social Work Discharge Planning Services: CM Consult Post Acute Care Choice: [place] arrangements for the past 2 months: Single Family Home                 DME Arranged: Chest tube pluerex DME Agency: NA       HH Arranged: RN, Disease Management HH Agency: [place] Date [place]: 08/27/21 Time [place]: 64 Representative spoke with at [place]: [place] ([place]) Interventions    Readmission Risk Interventions    06/03/2021    9:04 AM 12/03/2020    1:43 PM  Readmission Risk Prevention Plan  Transportation Screening Complete Complete  PCP or Specialist Appt within 3-5 Days  Complete  HRI or [place]  Complete  Social Work Consult for [place] Planning/Counseling  Complete  Palliative Care Screening  Not Applicable  Medication Review Press photographer) Complete Complete  PCP or Specialist appointment within 3-5 days of discharge Complete   HRI or [place] Complete   SW Recovery Care/Counseling Consult Complete   [place] Not Applicable

## 2021-08-27 NOTE — Progress Notes (Signed)
Mobility Specialist Progress Note:   08/27/21 1227  Mobility  Activity Ambulated with assistance in hallway  Level of Assistance Standby assist, set-up cues, supervision of patient - no hands on  Assistive Device None  Distance Ambulated (ft) 280 ft  Activity Response Tolerated well  $Mobility charge 1 Mobility   Pt received in chair willing to participate in mobility. No complaints of pain. Left in chair with call bell in reach and all needs met.   [facility] Keyanni [place] Mobility Specialist

## 2021-08-27 NOTE — Progress Notes (Signed)
Contacted [facility] to advise clinic that pt had pleurex cath placed today.   Melven Sartorius Renal Navigator [phone]

## 2021-08-28 DIAGNOSIS — Z944 Liver transplant status: Secondary | ICD-10-CM | POA: Diagnosis not present

## 2021-08-28 DIAGNOSIS — N186 End stage renal disease: Secondary | ICD-10-CM | POA: Diagnosis not present

## 2021-08-28 DIAGNOSIS — I471 Supraventricular tachycardia: Secondary | ICD-10-CM | POA: Diagnosis not present

## 2021-08-28 DIAGNOSIS — I5033 Acute on chronic diastolic (congestive) heart failure: Secondary | ICD-10-CM | POA: Diagnosis not present

## 2021-08-28 LAB — GLUCOSE, CAPILLARY
Glucose-Capillary: 92 mg/dL (ref 70–99)
Glucose-Capillary: 105 mg/dL — ABNORMAL HIGH (ref 70–99)
Glucose-Capillary: 126 mg/dL — ABNORMAL HIGH (ref 70–99)

## 2021-08-28 MED ORDER — ALTEPLASE 2 MG IJ SOLR: 2.0000 mg | Freq: Once | INTRAMUSCULAR | Status: DC | PRN

## 2021-08-28 MED ORDER — HEPARIN SODIUM (PORCINE) 1000 UNIT/ML DIALYSIS
4500.0000 [IU] | INTRAMUSCULAR | Status: DC | PRN
  Filled 2021-08-28 (×2): qty 5

## 2021-08-28 MED ORDER — HEPARIN SODIUM (PORCINE) 1000 UNIT/ML DIALYSIS
1000.0000 [IU] | INTRAMUSCULAR | Status: DC | PRN
  Filled 2021-08-28 (×2): qty 1

## 2021-08-28 NOTE — Progress Notes (Signed)
Spoke to MD today about providing teaching for spouse and patient regarding PleurX Drainage System. Provided education this evening, 950 mL output during session. Prior to drainage, patient's abdomen distended and firm. Post drainage, abdomen distended but soft. Patient tolerated, well.  Will review with patient and wife prior to discharge in the AM.

## 2021-08-28 NOTE — Procedures (Signed)
HD Note Patient in [place] for UF only treatment.  Treatment was delayed after his arrival to the [facility] due to machine difficulties. Patient was able to tolerate 2 hours and 18 minutes of UF with a total of 2.1L removed.  The order was fluid removal as tolerated. Treatment ended because of patient complaints of leg cramps and stomach pain. Patient was in a positive mood when leaving the [place] and looking forward to going home.

## 2021-08-28 NOTE — Progress Notes (Signed)
Progress Note   Patient: Gregory Tanner:[id number] DOB: 1955-04-23 DOA: 08/19/2021     7 DOS: the patient was seen and examined on 08/28/2021   Brief hospital course: [name] was admitted to the hospital with the working diagnosis of uncontrolled atrial flutter in the setting of volume overload.   66 yo male with the past medical history of diastolic heart failure, pulmonary hypertension, cirrhosis, hepatocellular cancer sp liver transplant, NASH, and ESRD who presented with rapid heart rate. Patient noted easy fatigability at home with minimal efforts. EMS was called and found with HR 234 bpm, with worsening dyspnea. On his initial physical examination his blood pressure was 118/68, HR 110, RR 23 and 02 saturation 98%. Lungs with no rales or wheezing, increased work of breathing, heart with S1 and S2 present and tachycardic, abdomen distended and positive lower extremity edema.   Na 133 K 5.0 CL 93, bicarbonate 24 glucose 123 bun 22 cr 3,0  BNP 1,348  Wbc 7,7 hgb 11,8 plt 149   Chest radiograph with cardiomegaly, mild vascular congestion, HD cathter in the right IJ. Chest CT with no acute changes.  Left pelvic sidewall indeterminate soft tissue.   EKG 144 bpm, normal axis, normal qtc, sinus rhythm with PAC (bigeminy pattern), no significant ST segment or T wave changes.   Patient received adenosin in the ed.  Underlying rhythm atrial flutter, placed on amiodarone for rate control.   IR consulted for paracentesis  Getting aggressive ultrafiltration per HD.   He had peritoneal cathter for drainage as outpatient.  I spoke with Dr Helene Kelp, patient's primary care and she is Ok in following catheter as outpatient.   07/14 peritoneal cathter for ascitic fluid drainage has been placed.   Patient will continue HD as outpatient 4 days per week, and peritoneal drainage every other day. Home health services have been arranged.   Assessment and Plan: * SVT (supraventricular tachycardia)  (HCC) Atrial flutter.  Continue rate control with amiodarone per protocol load and maintenance. No anticoagulation due to high risk of bleeding, in the setting of liver failure.  No AV node blockade due to risk of hypotension.     ESRD on dialysis ([place]) Hyponatremia.   Patient with severe volume overload.  Patient had UF every day per nephrology recommendations.   Bumetanide on non HD days.   Continue outpatient HD 4 days a week, patient with poor prognosis.    Acute on chronic diastolic CHF (congestive heart failure) (HCC) Echocardiogram (TEE)from 11/2020 with preserved LV systolic function EF 60 to 65%, D shaped septum, RV systolic function with mild reduction. Moderate TR.   Pulmonary hypertension with acute on chronic core pulmonale.   Continue ultrafiltration for volume management during hemodialysis.  Continue with midodrine for blood pressure support.   History of liver transplant ([place]) Cirrhosis, NASH.  Patient with recurrent ascites. Requiring frequent paracentesis.   I spoke with Dr Helene Kelp and she will follow peritoneal catheter as outpatient.  Cathter has been placed and equipment being arranged for outpatient drainage.   Continue with mycophenolate.   Type 2 diabetes mellitus ([place]) His glucose remained stable during his hospitalization, he was treated with insulin sliding scale.   Hypotension Continue blood pressure support with midodrine.         Subjective: Patient had HD today, had to stop early due to leg cramps, dyspnea and edema continue to improve.   Physical Exam: Vitals:   08/28/21 1402 08/28/21 1434 08/28/21 1500 08/28/21 1516  BP: 102/61 101/62  109/68 103/65  Pulse: 98 (!) 101 100 100  Resp: (!) 24 (!) 23 (!) 26 (!) 22  Temp:    97.6 F (36.4 C)  TempSrc:    Oral  SpO2: 98% 99% 98% 97%  Weight:   81.4 kg   Height:       Neurology awake and alert ENT with mild pallor Cardiovascular with S1 and S2 present and rhythmic Respiratory  with no rales Abdomen distended with cathter in place Lower extremity edema ++ improved.  Data Reviewed:    Family Communication: I spoke with patient's wofe at the bedside, we talked in detail about patient's condition, plan of care and prognosis and all questions were addressed.   Disposition: Status is: Inpatient Remains inpatient appropriate because: possible dc home tomorrow if hemodynamic stable   Planned Discharge Destination: Home  Author: Tawni Millers, MD 08/28/2021 3:32 PM  For on call review [email or web address].

## 2021-08-28 NOTE — Discharge Summary (Signed)
Physician Discharge Summary   Patient: Gregory Tanner MRN: [id number] DOB: [birth year]  Admit date:     08/19/2021  Discharge date: 08/29/21  Discharge Physician: Jimmy Picket Emory Leaver   PCP: Ronita Hipps, MD   Recommendations at discharge:    Patient will continue rate control with amiodarone 200 mg bid until 07/26 and then transition to 200 mg on 09/09/21. Continue peritoneal fluid drainage every other day.  Continue outpatient renal replacement therapy with ultrafiltration 4 days per week. Poor prognosis.   Discharge Diagnoses: Principal Problem:   SVT (supraventricular tachycardia) (HCC) Active Problems:   ESRD on dialysis ([place])   Acute on chronic diastolic CHF (congestive heart failure) (HCC)   Type 2 diabetes mellitus ([place])   History of liver transplant ([place])   Hypotension   Goals of care, counseling/discussion  Resolved Problems:   * No resolved hospital problems. [facility] Course: Gregory Tanner was admitted to the hospital with the working diagnosis of uncontrolled atrial flutter in the setting of volume overload.   66 yo male with the past medical history of diastolic heart failure, pulmonary hypertension, cirrhosis, hepatocellular cancer sp liver transplant, NASH, and ESRD who presented with rapid heart rate. Patient noted easy fatigability at home with minimal efforts. EMS was called and found with HR 234 bpm, with worsening dyspnea. On his initial physical examination his blood pressure was 118/68, HR 110, RR 23 and 02 saturation 98%. Lungs with no rales or wheezing, increased work of breathing, heart with S1 and S2 present and tachycardic, abdomen distended and positive lower extremity edema.   Na 133 K 5.0 CL 93, bicarbonate 24 glucose 123 bun 22 cr 3,0  BNP 1,348  Wbc 7,7 hgb 11,8 plt 149   Chest radiograph with cardiomegaly, mild vascular congestion, HD cathter in the right IJ. Chest CT with no acute changes.  Left pelvic sidewall indeterminate soft  tissue.   EKG 144 bpm, normal axis, normal qtc, sinus rhythm with PAC (bigeminy pattern), no significant ST segment or T wave changes.   Patient received adenosin in the ed.  Underlying rhythm atrial flutter, placed on amiodarone for rate control.   IR consulted for paracentesis  Getting aggressive ultrafiltration per HD.   He had peritoneal cathter for drainage as outpatient.  I spoke with Dr Helene Kelp, patient's primary care and she is Ok in following catheter as outpatient.   07/14 peritoneal cathter for ascitic fluid drainage has been placed.   Patient will continue HD as outpatient 4 days per week, and peritoneal drainage every other day. Home health services have been arranged.   Assessment and Plan: * SVT (supraventricular tachycardia) (HCC) Atrial flutter.  Continue rate control with amiodarone per protocol load and maintenance. No anticoagulation due to high risk of bleeding, in the setting of liver failure.  No AV node blockade due to risk of hypotension.     ESRD on dialysis ([place]) Hyponatremia.   Patient with severe volume overload.  Patient had UF every day per nephrology recommendations.   Bumetanide on non HD days.   Continue outpatient HD 4 days a week, patient with poor prognosis.    Acute on chronic diastolic CHF (congestive heart failure) (HCC) Echocardiogram (TEE)from 11/2020 with preserved LV systolic function EF 60 to 65%, D shaped septum, RV systolic function with mild reduction. Moderate TR.   Pulmonary hypertension with acute on chronic core pulmonale.   Continue ultrafiltration for volume management during hemodialysis.  Continue with midodrine for blood pressure support.  History of liver transplant ([place]) Cirrhosis, NASH.  Patient with recurrent ascites. Requiring frequent paracentesis.   I spoke with Dr Helene Kelp and she will follow peritoneal catheter as outpatient.  Cathter has been placed and equipment being arranged for outpatient drainage.    Continue with mycophenolate.   Type 2 diabetes mellitus ([place]) His glucose remained stable during his hospitalization, he was treated with insulin sliding scale.   Hypotension Continue blood pressure support with midodrine.          Consultants: nephrology, IR and cardiology  Procedures performed: Image guided drain placement, peritoneal tunneled externalized catheter Disposition: Home Diet recommendation:  Cardiac diet DISCHARGE MEDICATION: Allergies as of 08/29/2021       Reactions   Iodinated Contrast Media Hives, Other (See Comments)   Allergy is NOT to all contrast - but patient is unsure of which particular one his allergy is to. Contrast dye used before liver transplant cause hives, not sure which dye this was but is able to use others   Jardiance [empagliflozin] Dermatitis   developed blisters with the medication, blisters  stopped after he discontinued taking it. (About 6 months ago /~06/2020)   Latex Other (See Comments)   Skin peeling - adhesive   Metformin And Related Nausea And Vomiting   Other    Patient states that he is allergic to "some antibiotic" but is not sure of the name of it.   Rosiglitazone Nausea And Vomiting   GI effects and abdominal pain        Medication List     TAKE these medications    acetaminophen 325 MG tablet Commonly known as: TYLENOL Take 650 mg by mouth every 6 (six) hours as needed for mild pain.   allopurinol 300 MG tablet Commonly known as: ZYLOPRIM Take 150 mg by mouth daily.   amiodarone 200 MG tablet Commonly known as: PACERONE Take 1 tablet twice daily until 09/08/21, then on 09/09/21 start taking 1 tablet daily.   bumetanide 2 MG tablet Commonly known as: BUMEX Take 4 mg by mouth See admin instructions. Take 4 mg twice daily on non dialysis days. Tuesday, Thursday, and Sunday.   calcium carbonate 500 MG chewable tablet Commonly known as: TUMS - dosed in mg elemental calcium Chew 1 tablet by mouth daily as  needed for indigestion or heartburn.   insulin lispro 100 UNIT/ML KwikPen Commonly known as: HUMALOG Inject 2-24 Units into the skin See admin instructions. Sliding scale 150-200= 2 units 201-250=3 units 251-300=4 units 301-350=5 units 8 units for small meals 24 units for High carbohydrates   midodrine 10 MG tablet Commonly known as: PROAMATINE Take 10 mg by mouth See admin instructions. Take '10mg'$  before dialysis on Monday, Wednesday, Friday, and  some Saturday depend on food retention   multivitamin tablet Take 1 tablet by mouth daily.   mycophenolate 250 MG capsule Commonly known as: CELLCEPT Take 4 capsules (1,000 mg total) by mouth in the morning and at bedtime.   TART CHERRY PO Take 1 tablet by mouth in the morning and at bedtime.               Discharge Care Instructions  (From admission, onward)           Start     Ordered   08/29/21 0000  Discharge wound care:       Comments: Remove wraps from bilateral lower extremities and clean the legs with soap and water, rinse and pat dry then apply Cetaphil lotion over both  lower extremities and wrap with kerlic and Ace wrap. Change Monday, Wednesday and Friday.   08/29/21 1035            Follow-up Information     Ronita Hipps, MD Follow up.   Specialty: Family Medicine Contact information: [place] [phone]         Care, [place] Follow up.   Why: [facility] will contact you to coordinate apt times. Contact information: [place] [phone]                Discharge Exam: Filed Weights   08/27/21 0341 08/27/21 1425 08/28/21 0459  Weight: 87.6 kg 84.5 kg 83.2 kg   BP (!) 104/52 (BP Location: Right Arm)   Pulse (!) 103   Temp 98.2 F (36.8 C) (Oral)   Resp (!) 30   Ht '5\' 10"'$  (1.778 m)   Wt 83.2 kg   SpO2 97%   BMI 26.32 kg/m   Patient is feeling well, no chest pain  Neurology awake and  alert ENT with mild pallor Cardiovascular with S1 and loud S2 present, regular  Respiratory with no wheezing or rhonchi Abdomen distended, cathter in place Positive lower extremity edema, wraps in place. Pitting ++    Condition at discharge: stable  The results of significant diagnostics from this hospitalization (including imaging, microbiology, ancillary and laboratory) are listed below for reference.   Imaging Studies: IR PERC PLEURAL DRAIN W/INDWELL CATH W/IMG GUIDE  Result Date: 08/27/2021 INDICATION: 66 year old male referred for a tunneled peritoneal catheter EXAM: IMAGE GUIDED TUNNELED PERITONEAL CATHETER MEDICATIONS: The patient is currently admitted to the hospital and receiving intravenous antibiotics. The antibiotics were administered within an appropriate time frame prior to the initiation of the procedure. ANESTHESIA/SEDATION: Fentanyl 1.0 mcg IV; Versed 50 mg IV Moderate Sedation Time:  10 minutes The patient was continuously monitored during the procedure by the interventional radiology nurse under my direct supervision. COMPLICATIONS: None PROCEDURE: The procedure, risks, benefits, and alternatives were explained to the patient and the patient's family. Specific risks that were addressed included bleeding, infection, need for further procedure, chance of delayed hemorrhage, cardiopulmonary collapse, death. Questions regarding the procedure were encouraged and answered. The patient understands and consents to the procedure. The right abdominal wall was prepped with Betadine in a sterile fashion, and a sterile drape was applied covering the operative field. A sterile gown and sterile gloves were used for the procedure. Local anesthesia was provided with 1% Lidocaine. Ultrasound image documentation was performed. After creating a small skin incision, a 19 gauge needle was advanced into the peritoneal cavity under ultrasound guidance. A guide wire was then advanced under fluoroscopy into  the space. Access was dilated serially and a 16-French peel-away sheath placed. The skin and subcutaneous tissues were generously infiltrated with 1% lidocaine from the puncture site along the abdomen anteriorly. A small stab incision was made with 11 blade scalpel at the insertion site of the catheter, and the catheter was back tunneled to the site at the puncture. A tunneled peritoneal catheter was placed. This was tunneled from the incision 5 cm anterior to the access to the access site. The catheter was advanced through the peel-away sheath. The sheath was then removed. Final catheter positioning was confirmed with a fluoroscopic spot image. The access incision was closed with Derma bond. Dermabond was applied to the catheterization incision. Large volume paracentesis was performed through the new catheter utilizing vacuum containers.  The patient tolerated the procedure well and remained hemodynamically stable throughout. No complications were encountered and no significant blood loss was encountered. IMPRESSION: Status post image guided tunneled peritoneal drainage catheter. Signed, Dulcy Fanny. Nadene Rubins, RPVI [facility] Radiology Electronically Signed   By: Corrie Mckusick D.O.   On: 08/27/2021 09:22   US Paracentesis  Result Date: 08/24/2021 INDICATION: Patient with history of NASH cirrhosis and HCC status post liver transplant, heart failure, ESRD with recurrent ascites. Request for therapeutic paracentesis. EXAM: ULTRASOUND GUIDED  PARACENTESIS MEDICATIONS: 7 mL 1% lidocaine COMPLICATIONS: None immediate. PROCEDURE: Informed written consent was obtained from the patient after a discussion of the risks, benefits and alternatives to treatment. A timeout was performed prior to the initiation of the procedure. Initial ultrasound scanning demonstrates a moderate amount of ascites within the left lower abdominal quadrant. The left lower abdomen was prepped  and draped in the usual sterile fashion. 1% lidocaine was used for local anesthesia. Following this, a 19 gauge, 7-cm, Yueh catheter was introduced. An ultrasound image was saved for documentation purposes. The paracentesis was performed. The catheter was removed and a dressing was applied. The patient tolerated the procedure well without immediate post procedural complication. FINDINGS: A total of approximately 2.4 L of hazy amber fluid was removed. IMPRESSION: Successful ultrasound-guided paracentesis yielding 2.4 liters of peritoneal fluid. PLAN: The patient has required >/=2 paracenteses in a 30 day period and a formal evaluation by the [facility] has been arranged. Read by: Durenda Guthrie, PA-C Electronically Signed   By: Albin Felling M.D.   On: 08/24/2021 08:09   CT CHEST ABDOMEN PELVIS WO CONTRAST  Result Date: 08/19/2021 CLINICAL DATA:  Tachycardia. EXAM: CT CHEST, ABDOMEN AND PELVIS WITHOUT CONTRAST TECHNIQUE: Multidetector CT imaging of the chest, abdomen and pelvis was performed following the standard protocol without IV contrast. RADIATION DOSE REDUCTION: This exam was performed according to the departmental dose-optimization program which includes automated exposure control, adjustment of the mA and/or kV according to patient size and/or use of iterative reconstruction technique. COMPARISON:  Chest CT 12/05/2020.  Abdominal CT 09/18/2007 FINDINGS: CT CHEST FINDINGS Cardiovascular: Enlarged heart. No pericardial effusion. No acute vascular finding. Prominent size of peripheral pulmonary arteries implying pulmonary hypertension although the main pulmonary artery does not appear particularly enlarged. Dialysis catheter with tip at the upper right atrium. Mediastinum/Nodes: Negative for adenopathy or mass. Lungs/Pleura: There is no edema, consolidation, effusion, or pneumothorax. Musculoskeletal: Bridging thoracic osteophytes CT ABDOMEN PELVIS FINDINGS  Hepatobiliary: Lobulation of the transplanted liver surface. No acute or focal finding.No evidence of biliary obstruction or stone. Pancreas: Unremarkable. Spleen: Chronic splenic enlargement, likely from portal hypertension. Adrenals/Urinary Tract: Negative adrenals. No hydronephrosis or stone. Isodense 19 mm nodule from the upper pole right kidney. 2.4 cm isodense nodule from the lower pole left kidney. Simple and proteinaceous cysts were noted on abdominal MRI from earlier this year noted in care everywhere. Unremarkable bladder. Stomach/Bowel: Colonic diverticulosis. No evidence of bowel obstruction or visible inflammation Vascular/Lymphatic: Atheromatous calcifications. Dilated IVC and pelvic veins. Aneurysmal enlargement of the left common iliac artery to 3.5 cm diameter, unchanged from abdominal MRI report 04/03/2021 at [facility]. There is indistinct soft tissue density along the left pelvic sidewall measuring up to 4.4 cm, it is unclear if this is adenopathy or vessel. The density and discrete appearance is not suggestive of hematoma. No generalized adenopathy. Reproductive:No pathologic findings. Other: Small volume ascites. Musculoskeletal: No acute finding.   L2-S1 lumbar  fusion. IMPRESSION: 1. Indeterminate soft tissue density along the left pelvic sidewall which could be adenopathy or abnormal vessel. Postcontrast imaging is recommended when clinically possible. 2. No acute finding in the chest.  Chronic cardiomegaly. 3. Transplanted liver. Electronically Signed   By: Jorje Guild M.D.   On: 08/19/2021 06:02   DG Chest Port 1 View  Result Date: 08/19/2021 CLINICAL DATA:  Shortness of breath. EXAM: PORTABLE CHEST 1 VIEW COMPARISON:  07/16/2021 FINDINGS: The heart is enlarged the mediastinal contour is stable. The pulmonary vasculature is distended. Mild airspace disease is present bilaterally. A stable right internal jugular central venous catheter is noted. No effusion or pneumothorax. No acute osseous  abnormality. IMPRESSION: Cardiomegaly with pulmonary vascular congestion. Mild airspace disease is noted bilaterally, possible edema or infiltrate. Electronically Signed   By: Brett Fairy M.D.   On: 08/19/2021 02:59   IR Paracentesis  Result Date: 08/09/2021 INDICATION: Patient with history of hepatocellular carcinoma, NASH cirrhosis status post liver transplant with recurrent ascites. Request to IR for therapeutic paracentesis. EXAM: ULTRASOUND GUIDED THERAPEUTIC PARACENTESIS MEDICATIONS: 7 mL 1% lidocaine COMPLICATIONS: None immediate. PROCEDURE: Informed written consent was obtained from the patient after a discussion of the risks, benefits and alternatives to treatment. A timeout was performed prior to the initiation of the procedure. Initial ultrasound scanning demonstrates a large amount of ascites within the right lower abdominal quadrant. The right lower abdomen was prepped and draped in the usual sterile fashion. 1% lidocaine was used for local anesthesia. Following this, a 19 gauge, 7-cm, Yueh catheter was introduced. An ultrasound image was saved for documentation purposes. The paracentesis was performed. The catheter was removed and a dressing was applied. The patient tolerated the procedure well without immediate post procedural complication. FINDINGS: A total of approximately 3.8 L of hazy orange fluid was removed. IMPRESSION: Successful ultrasound-guided paracentesis yielding 3.8 liters of peritoneal fluid. Read by Candiss Norse, PA-C Electronically Signed   By: Ruthann Cancer M.D.   On: 08/09/2021 13:36   IR Paracentesis  Result Date: 08/02/2021 INDICATION: Patient with a history of hepatocellular carcinoma and NASH cirrhosis status post liver transplant with recurrent ascites related to cardiorenal syndrome. Request for therapeutic paracentesis up to 3 L max EXAM: ULTRASOUND GUIDED PARACENTESIS MEDICATIONS: 1% lidocaine 10 mL COMPLICATIONS: None immediate. PROCEDURE: Informed written  consent was obtained from the patient after a discussion of the risks, benefits and alternatives to treatment. A timeout was performed prior to the initiation of the procedure. Initial ultrasound scanning demonstrates a large amount of ascites within the right lower abdominal quadrant. The right lower abdomen was prepped and draped in the usual sterile fashion. 1% lidocaine was used for local anesthesia. Following this, a 19 gauge, 7-cm, Yueh catheter was introduced. An ultrasound image was saved for documentation purposes. The paracentesis was performed. The catheter was removed and a dressing was applied. The patient tolerated the procedure well without immediate post procedural complication. FINDINGS: A total of approximately 3 L of light red fluid was removed. IMPRESSION: Successful ultrasound-guided paracentesis yielding 3 liters of peritoneal fluid. Read by: Soyla Dryer, NP Electronically Signed   By: Jerilynn Mages.  Shick M.D.   On: 08/02/2021 13:39   IR Paracentesis  Result Date: 07/30/2021 INDICATION: Patient with a history of hepatocellular carcinoma and NASH cirrhosis status post liver transplant with recurrent ascites related to cardiorenal syndrome. Request for therapeutic paracentesis up to 3 L max. EXAM: ULTRASOUND GUIDED PARACENTESIS MEDICATIONS: 1% lidocaine 10 mL COMPLICATIONS: None immediate. PROCEDURE: Informed written consent was obtained  from the patient after a discussion of the risks, benefits and alternatives to treatment. A timeout was performed prior to the initiation of the procedure. Initial ultrasound scanning demonstrates a large amount of ascites within the left lower abdominal quadrant. The left lower abdomen was prepped and draped in the usual sterile fashion. 1% lidocaine was used for local anesthesia. Following this, a 19 gauge, 7-cm, Yueh catheter was introduced. An ultrasound image was saved for documentation purposes. The paracentesis was performed. The catheter was removed and a  dressing was applied. The patient tolerated the procedure well without immediate post procedural complication. FINDINGS: A total of approximately 3 L of red fluid was removed. IMPRESSION: Successful ultrasound-guided paracentesis yielding 3 liters of peritoneal fluid. Read by: Soyla Dryer, NP Electronically Signed   By: Jacqulynn Cadet M.D.   On: 07/30/2021 16:02    Microbiology: Results for orders placed or performed during the hospital encounter of 06/01/21  Resp Panel by RT-PCR (Flu A&B, Covid) Nasopharyngeal Swab     Status: None   Collection Time: 06/01/21  2:52 AM   Specimen: Nasopharyngeal Swab; Nasopharyngeal(NP) swabs in vial transport medium  Result Value Ref Range Status   SARS Coronavirus 2 by RT PCR NEGATIVE NEGATIVE Final    Comment: (NOTE) SARS-CoV-2 target nucleic acids are NOT DETECTED.  The SARS-CoV-2 RNA is generally detectable in upper respiratory specimens during the acute phase of infection. The lowest concentration of SARS-CoV-2 viral copies this assay can detect is 138 copies/mL. A negative result does not preclude SARS-Cov-2 infection and should not be used as the sole basis for treatment or other patient management decisions. A negative result may occur with  improper specimen collection/handling, submission of specimen other than nasopharyngeal swab, presence of viral mutation(s) within the areas targeted by this assay, and inadequate number of viral copies(<138 copies/mL). A negative result must be combined with clinical observations, patient history, and epidemiological information. The expected result is Negative.  Fact Sheet for Patients:  EntrepreneurPulse.com.au  Fact Sheet for Healthcare Providers:  IncredibleEmployment.be  This test is no t yet approved or cleared by the [place] FDA and  has been authorized for detection and/or diagnosis of SARS-CoV-2 by FDA under an Emergency Use Authorization (EUA).  This EUA will remain  in effect (meaning this test can be used) for the duration of the COVID-19 declaration under Section 564(b)(1) of the Act, 21 U.S.C.section 360bbb-3(b)(1), unless the authorization is terminated  or revoked sooner.       Influenza A by PCR NEGATIVE NEGATIVE Final   Influenza B by PCR NEGATIVE NEGATIVE Final    Comment: (NOTE) The Xpert Xpress SARS-CoV-2/FLU/RSV plus assay is intended as an aid in the diagnosis of influenza from Nasopharyngeal swab specimens and should not be used as a sole basis for treatment. Nasal washings and aspirates are unacceptable for Xpert Xpress SARS-CoV-2/FLU/RSV testing.  Fact Sheet for Patients: EntrepreneurPulse.com.au  Fact Sheet for Healthcare Providers: IncredibleEmployment.be  This test is not yet approved or cleared by the [place] FDA and has been authorized for detection and/or diagnosis of SARS-CoV-2 by FDA under an Emergency Use Authorization (EUA). This EUA will remain in effect (meaning this test can be used) for the duration of the COVID-19 declaration under Section 564(b)(1) of the Act, 21 U.S.C. section 360bbb-3(b)(1), unless the authorization is terminated or revoked.  Performed at [facility], [place] [address], [place], [place] [address]   MRSA Next Gen by PCR, Nasal     Status: None  Collection Time: 06/02/21  4:30 AM   Specimen: Nasal Mucosa; Nasal Swab  Result Value Ref Range Status   MRSA by PCR Next Gen NOT DETECTED NOT DETECTED Final    Comment: (NOTE) The GeneXpert MRSA Assay (FDA approved for NASAL specimens only), is one component of a comprehensive MRSA colonization surveillance program. It is not intended to diagnose MRSA infection nor to guide or monitor treatment for MRSA infections. Test performance is not FDA approved in patients less than 23 years old. Performed at [facility], [place] [address], [place], [place] [address]      Labs: CBC: Recent Labs  Lab 08/23/21 0403 08/24/21 0855 08/27/21 0442  WBC 8.2 6.6 6.9  NEUTROABS  --   --  5.2  HGB 12.1* 10.9* 11.4*  HCT 37.4* 33.9* 35.3*  MCV 97.1 96.0 97.2  PLT 127* 131* 599*   Basic Metabolic Panel: Recent Labs  Lab 08/22/21 0226 08/23/21 0403 08/24/21 0452 08/26/21 0242 08/27/21 0442  NA 129* 126*  128* 129* 126* 128*  K 3.6 4.8  4.9 4.7 4.7 4.9  CL 90* 88*  88* 90* 89* 90*  CO2 24 22  21* 24 20* 27  GLUCOSE 192* 126*  130* 106* 131* 112*  BUN 19 42*  42* 31* 36* 32*  CREATININE 3.07* 5.24*  5.05* 3.83* 4.05* 3.65*  CALCIUM 7.7* 8.1*  8.2* 8.5* 8.3* 8.5*  PHOS  --  5.5*  --   --  4.3   Liver Function Tests: Recent Labs  Lab 08/23/21 0403 08/27/21 0442  ALBUMIN 3.3* 4.0   CBG: Recent Labs  Lab 08/27/21 0617 08/27/21 1113 08/27/21 1855 08/27/21 2123 08/28/21 0558  GLUCAP 114* 179* 115* 288* 92    Discharge time spent: greater than 30 minutes.  Signed: Tawni Millers, MD [facility] 08/28/2021

## 2021-08-28 NOTE — Progress Notes (Signed)
Subjective: Seen on HD tolerating UF(today is extra treatment for only UF), said tolerated UF yesterday on HD.  Mr. Blackerby said "he and wife to get education after HD for abdominal Pleurx catheter use for home paracentesis with discharge"  Objective Vital signs in last 24 hours: Vitals:   08/28/21 0852 08/28/21 0900 08/28/21 0950 08/28/21 0951  BP: (!) 104/52     Pulse: (!) 103     Resp: 19 (!) 30    Temp: 98.2 F (36.8 C)     TempSrc: Oral     SpO2: 97%     Weight:   82.8 kg 82.8 kg  Height:       Weight change: -3.5 kg  Physical Exam: General: Pleasant, chronically-ill appearing adult male NAD; On 2L [place] Heart: RRR no MRG  Lungs: Diminished laterally and clear anteriorly Abdomen: NABS, distended with ascites nontender right flank catheter in place dressing dry clear Extremities:  bilat. LE edema improving, continue ace wraps to LEs Dialysis Access: Right IJ TDC patent on HD   Dialysis Orders: MWF at [facility] 4hr, 400/800, EDW 84.5 (doesn't typically reach, post wt 7/5 was 88.8kg), TDC, heparin 4000 unit bolus + 2500 unit mid-run bolus - Calcitriol 0.70mg PO q HD - no ESA     Problem/Plan: SVT: S/p adenosine x 2 on admit.  SR on exam today , currently rate controlled on  po amiodarone. Acute on chronic diastolic CHF HCC: Has improved with UF on HD and paracentesis, 08/27/21 IR  placed PleurX abdominal catheter to help with recurrent ascites to be used OP with home RN and primary care Dr HHelene Kelpsupervising,   Recommend  OP HD 4 days per week. = As Needed Sat  UF  Txs  as needed .  He is currently slightly below EDW, follow-up today's weight post  ESRD:  Continue HD usual MWF schedule - Given volume overload, SVT, and hypotension, will plan for HD 4 days per week in outpatient to help manage volume.  But with home abdominal catheter may need normal schedule follow-up volume trend as outpatient Hyponatremia likely d/t hypervolemia.  Rx on HD  Hypotension/volume: Chronic hypotension,  uses midodrine pre-HD, may need to consider daily use. Chronic anasarca. Tells me pulls 5-5.5L w/ OP HD.   Ascites: paracentesis now to be managed with a Pleurx abdominal apparatus IR inserted 08/27/2021 per admit team =spoke with Dr HHelene Kelp patient's primary care and she is Ok in follow ing catheter as outpatient  Anemia: Hgb 11.4 no ESA, monitor trend closely.  Metabolic bone disease: Ca ok, last PO4 ok.   Nutrition:  Alb 4.0 will add supplement if gets lower Hx liver transplant 2013: Functioning per patient, continue IS regimen.  Disposition : poor overall prognosis and situation.refusing hospice and wants to continue with hemodialysis at this time. Palliative has little to offer given current situation. Continue goals of care discussion.  DErnest Haber PA-C [facility]per [phone]/15/2023,10:03 AM  LOS: 7 days   Labs: Basic Metabolic Panel: Recent Labs  Lab 08/23/21 0403 08/24/21 0452 08/26/21 0242 08/27/21 0442  NA 126*  128* 129* 126* 128*  K 4.8  4.9 4.7 4.7 4.9  CL 88*  88* 90* 89* 90*  CO2 22  21* 24 20* 27  GLUCOSE 126*  130* 106* 131* 112*  BUN 42*  42* 31* 36* 32*  CREATININE 5.24*  5.05* 3.83* 4.05* 3.65*  CALCIUM 8.1*  8.2* 8.5* 8.3* 8.5*  PHOS 5.5*  --   --  4.3   Liver Function Tests: Recent Labs  Lab 08/23/21 0403 08/27/21 0442  ALBUMIN 3.3* 4.0   No results for input(s): "LIPASE", "AMYLASE" in the last 168 hours. No results for input(s): "AMMONIA" in the last 168 hours. CBC: Recent Labs  Lab 08/23/21 0403 08/24/21 0855 08/27/21 0442  WBC 8.2 6.6 6.9  NEUTROABS  --   --  5.2  HGB 12.1* 10.9* 11.4*  HCT 37.4* 33.9* 35.3*  MCV 97.1 96.0 97.2  PLT 127* 131* 117*   Cardiac Enzymes: No results for input(s): "CKTOTAL", "CKMB", "CKMBINDEX", "TROPONINI" in the last 168 hours. CBG: Recent Labs  Lab 08/27/21 0617 08/27/21 1113 08/27/21 1855 08/27/21 2123 08/28/21 0558  GLUCAP 114* 179* 115* 288* 92     Studies/Results: IR PERC PLEURAL DRAIN W/INDWELL CATH W/IMG GUIDE  Result Date: 08/27/2021 INDICATION: 66 year old male referred for a tunneled peritoneal catheter EXAM: IMAGE GUIDED TUNNELED PERITONEAL CATHETER MEDICATIONS: The patient is currently admitted to the hospital and receiving intravenous antibiotics. The antibiotics were administered within an appropriate time frame prior to the initiation of the procedure. ANESTHESIA/SEDATION: Fentanyl 1.0 mcg IV; Versed 50 mg IV Moderate Sedation Time:  10 minutes The patient was continuously monitored during the procedure by the interventional radiology nurse under my direct supervision. COMPLICATIONS: None PROCEDURE: The procedure, risks, benefits, and alternatives were explained to the patient and the patient's family. Specific risks that were addressed included bleeding, infection, need for further procedure, chance of delayed hemorrhage, cardiopulmonary collapse, death. Questions regarding the procedure were encouraged and answered. The patient understands and consents to the procedure. The right abdominal wall was prepped with Betadine in a sterile fashion, and a sterile drape was applied covering the operative field. A sterile gown and sterile gloves were used for the procedure. Local anesthesia was provided with 1% Lidocaine. Ultrasound image documentation was performed. After creating a small skin incision, a 19 gauge needle was advanced into the peritoneal cavity under ultrasound guidance. A guide wire was then advanced under fluoroscopy into the space. Access was dilated serially and a 16-French peel-away sheath placed. The skin and subcutaneous tissues were generously infiltrated with 1% lidocaine from the puncture site along the abdomen anteriorly. A small stab incision was made with 11 blade scalpel at the insertion site of the catheter, and the catheter was back tunneled to the site at the puncture. A tunneled peritoneal catheter was placed.  This was tunneled from the incision 5 cm anterior to the access to the access site. The catheter was advanced through the peel-away sheath. The sheath was then removed. Final catheter positioning was confirmed with a fluoroscopic spot image. The access incision was closed with Derma bond. Dermabond was applied to the catheterization incision. Large volume paracentesis was performed through the new catheter utilizing vacuum containers. The patient tolerated the procedure well and remained hemodynamically stable throughout. No complications were encountered and no significant blood loss was encountered. IMPRESSION: Status post image guided tunneled peritoneal drainage catheter. Signed, Dulcy Fanny. Nadene Rubins, RPVI [facility] Radiology Electronically Signed   By: Corrie Mckusick D.O.   On: 08/27/2021 09:22   Medications:   allopurinol  150 mg Oral Daily   amiodarone  200 mg Oral BID   Followed by   Derrill Memo ON 09/09/2021] amiodarone  200 mg Oral Daily   bumetanide  4 mg Oral Once per day on Sun Tue Thu   Chlorhexidine Gluconate Cloth  6 each Topical Q0600   Chlorhexidine Gluconate Cloth  6 each Topical Q0600   docusate sodium  100 mg Oral BID   heparin  5,000 Units Subcutaneous Q8H   insulin aspart  0-6 Units Subcutaneous TID WC   midodrine  10 mg Oral 2 times per day on Mon Wed Fri Sat   midodrine  10 mg Oral Once in dialysis   mycophenolate  1,000 mg Oral BID   sodium chloride flush  3 mL Intravenous Q12H

## 2021-08-29 LAB — GLUCOSE, CAPILLARY
Glucose-Capillary: 147 mg/dL — ABNORMAL HIGH (ref 70–99)
Glucose-Capillary: 109 mg/dL — ABNORMAL HIGH (ref 70–99)

## 2021-08-29 MED ORDER — CHLORHEXIDINE GLUCONATE CLOTH 2 % EX PADS
6.0000 | MEDICATED_PAD | Freq: Every day | CUTANEOUS | Status: DC
  Administered 2021-08-29: 6 via TOPICAL

## 2021-08-29 MED ORDER — AMIODARONE HCL 200 MG PO TABS: ORAL_TABLET | ORAL | 0 refills | Status: DC

## 2021-08-29 MED ORDER — CHLORHEXIDINE GLUCONATE CLOTH 2 % EX PADS: 6.0000 | MEDICATED_PAD | Freq: Every day | CUTANEOUS | Status: DC

## 2021-08-29 NOTE — TOC Transition Note (Signed)
Transition of Care [facility]) - CM/SW Discharge Note   Patient Details  Name: Gregory Tanner MRN: [id number] Date of Birth: 1955/04/16  Transition of Care [facility]) CM/SW Contact:  Carles Collet, RN Phone Number: 08/29/2021, 10:56 AM   Clinical Narrative:    Verified w Fredrich Birks RN CM that Pleurex drain order forms were faxed to DME provider Friday. Verified with bedside nurse that wife took home box of 10 caths Friday for initial use. Spoke w Sharmon Revere RN, liaison at [facility], and verified that [facility] RN will see patient either Tomorrow or at the latest Tuesday AM. Informed Malachy Mood that specific instructions from IR MD were added to [facility] RN order for management of cath for her to review and pass on to [facility] RN. Malachy Mood requested I add her name and number to AVS for family to call with any questions or troubleshooting.  No other TOC needs identified.    Final next level of care: Welcome Barriers to Discharge: No Barriers Identified   Patient Goals and CMS Choice Patient states their goals for this hospitalization and ongoing recovery are:: return home with pluerx dreain with [facility] CMS Medicare.gov Compare Post Acute Care list provided to:: Patient Choice offered to / list presented to : Patient  Discharge Placement                       Discharge Plan and Services In-house Referral: Clinical Social Work Discharge Planning Services: CM Consult Post Acute Care Choice: Home Health          DME Arranged: Chest tube pluerex DME Agency: NA       HH Arranged: RN, Disease Management [place] Agency: [place] Date [place]: 08/29/21 Time [place]: [id number] Representative spoke with at [place]: [place] ([place]) Interventions     Readmission Risk Interventions    06/03/2021    9:04 AM 12/03/2020    1:43 PM  Readmission Risk Prevention Plan  Transportation Screening Complete Complete  PCP or Specialist  Appt within 3-5 Days  Complete  HRI or [place]  Complete  Social Work Consult for [place] Planning/Counseling  Complete  Palliative Care Screening  Not Applicable  Medication Review Press photographer) Complete Complete  PCP or Specialist appointment within 3-5 days of discharge Complete   HRI or [place] Complete   SW Recovery Care/Counseling Consult Complete   [place] Not Applicable

## 2021-08-29 NOTE — Progress Notes (Addendum)
Reviewed care of PleurX drainage system with patient and spouse. Gregory Tanner stated she felt better about managing the drainage system at home after reviewing with primary RN. [place] nurse's name and number in discharge packet. Instructed her to call with any questions.

## 2021-08-29 NOTE — Progress Notes (Signed)
Subjective: Sitting upright in bedside chair no complaints, yest.2.1 L UF with 2-hour 18 minutes isolated UF only for volume abdominal cramping and of treatment.   plans for discharge today.  Wife in room said they had training for use of abdominal Pleurx cath for home use yesterday .  Objective Vital signs in last 24 hours: Vitals:   08/28/21 1926 08/29/21 0407 08/29/21 0410 08/29/21 0826  BP: 108/67  131/69 (!) 100/54  Pulse: 100  96 100  Resp: 18 (!) 33 20 (!) 21  Temp: 98.4 F (36.9 C)  98.5 F (36.9 C) 98.3 F (36.8 C)  TempSrc: Oral  Oral Oral  SpO2: 95%  99% 96%  Weight:   82.9 kg   Height:       Weight change: -1.7 kg  Physical Exam: General: Pleasant, chronically-ill appearing NAD; On 2L [place] Heart: RRR no MRG  Lungs: Diminished laterally and clear anteriorly Abdomen: NABS, distended with ascites nontender /right flank catheter in place dressing dry clear Extremities:  bilat. LE edema improving, continue ace wraps to LEs Dialysis Access: Right IJ [facility]    Dialysis Orders: MWF at [facility] 4hr, 400/800, EDW 84.5 (doesn't typically reach, post wt 7/5 was 88.8kg), TDC, heparin 4000 unit bolus + 2500 unit mid-run bolus - Calcitriol 0.63mg PO q HD - no ESA     Problem/Plan: SVT: S/p adenosine x 2 on admit.  SR on exam today , currently rate controlled on  po amiodarone. Acute on chronic diastolic CHF HCC: Has improved with UF on HD and paracentesis, 08/27/21 IR  placed PleurX abdominal catheter to help with recurrent ascites to be used OP with home RN and primary care Dr HHelene Kelpsupervising, Will have extra HD day Sat for UF prn    He is currently slightly below EDW, but with street close no change for now with EDW . With his cramping yest .  ESRD:  Continue HD usual MWF schedule - pren UF Sat/ Given volume overload, SVT, and hypoten. Now has abd cath for ascites drain  fu vol trend  Hyponatremia  128 likely d/t hypervolemia.  Rx on HD  Hypotension/volume: Chronic hypotension, uses  midodrine pre-HD, may need to consider daily use. Chronic anasarca.    Ascites: paracentesis now to be managed with a Pleurx abdominal apparatus IR inserted 08/27/2021 ,per admit team =spoke with Dr HHelene Kelp patient's primary care and she is Ok in follow ing catheter as outpatient  Anemia: Hgb 11.4 no ESA, monitor trend closely.  Metabolic bone disease: Ca ok, last PO4 ok.   Nutrition:  Alb 4.0 will add supplement if gets lower Hx liver transplant 2013: With cirrhosis/NASH, ascites,= paracentesis catheter as above   Disposition : Palliative care has seen in hospital, poor overall prognosis and situation.refusing hospice and wants to continue with hemodialysis at this time.  DErnest Haber PA-C [facility] Bee[facility] [phone]/16/2023,11:04 AM  LOS: 8 days   Labs: Basic Metabolic Panel: Recent Labs  Lab 08/23/21 0403 08/24/21 0452 08/26/21 0242 08/27/21 0442  NA 126*  128* 129* 126* 128*  K 4.8  4.9 4.7 4.7 4.9  CL 88*  88* 90* 89* 90*  CO2 22  21* 24 20* 27  GLUCOSE 126*  130* 106* 131* 112*  BUN 42*  42* 31* 36* 32*  CREATININE 5.24*  5.05* 3.83* 4.05* 3.65*  CALCIUM 8.1*  8.2* 8.5* 8.3* 8.5*  PHOS 5.5*  --   --  4.3   Liver Function Tests: Recent Labs  Lab  08/23/21 0403 08/27/21 0442  ALBUMIN 3.3* 4.0   No results for input(s): "LIPASE", "AMYLASE" in the last 168 hours. No results for input(s): "AMMONIA" in the last 168 hours. CBC: Recent Labs  Lab 08/23/21 0403 08/24/21 0855 08/27/21 0442  WBC 8.2 6.6 6.9  NEUTROABS  --   --  5.2  HGB 12.1* 10.9* 11.4*  HCT 37.4* 33.9* 35.3*  MCV 97.1 96.0 97.2  PLT 127* 131* 117*   Cardiac Enzymes: No results for input(s): "CKTOTAL", "CKMB", "CKMBINDEX", "TROPONINI" in the last 168 hours. CBG: Recent Labs  Lab 08/27/21 2123 08/28/21 0558 08/28/21 1527 08/28/21 2113 08/29/21 0601  GLUCAP 288* 92 105* 126* 147*    Studies/Results: No results found. Medications:   allopurinol  150 mg Oral Daily    amiodarone  200 mg Oral BID   Followed by   Derrill Memo ON 09/09/2021] amiodarone  200 mg Oral Daily   bumetanide  4 mg Oral Once per day on Sun Tue Thu   Chlorhexidine Gluconate Cloth  6 each Topical Q0600   Chlorhexidine Gluconate Cloth  6 each Topical Q0600   docusate sodium  100 mg Oral BID   heparin  5,000 Units Subcutaneous Q8H   insulin aspart  0-6 Units Subcutaneous TID WC   midodrine  10 mg Oral 2 times per day on Mon Wed Fri Sat   midodrine  10 mg Oral Once in dialysis   mycophenolate  1,000 mg Oral BID   sodium chloride flush  3 mL Intravenous Q12H

## 2021-08-30 DIAGNOSIS — N186 End stage renal disease: Secondary | ICD-10-CM | POA: Diagnosis not present

## 2021-08-30 DIAGNOSIS — D509 Iron deficiency anemia, unspecified: Secondary | ICD-10-CM | POA: Diagnosis not present

## 2021-08-30 DIAGNOSIS — Z992 Dependence on renal dialysis: Secondary | ICD-10-CM | POA: Diagnosis not present

## 2021-08-30 DIAGNOSIS — E1122 Type 2 diabetes mellitus with diabetic chronic kidney disease: Secondary | ICD-10-CM | POA: Diagnosis not present

## 2021-08-30 DIAGNOSIS — D689 Coagulation defect, unspecified: Secondary | ICD-10-CM | POA: Diagnosis not present

## 2021-08-30 DIAGNOSIS — N2581 Secondary hyperparathyroidism of renal origin: Secondary | ICD-10-CM | POA: Diagnosis not present

## 2021-08-30 NOTE — Progress Notes (Signed)
Late Entry Note:  Pt was d/c to home this weekend. Contacted [place] this am to advise clinic of pt's d/c and that pt will resume today. Clinic will advise pt of times for Saturday appts.   Melven Sartorius Renal Navigator [phone]

## 2021-08-31 ENCOUNTER — Ambulatory Visit (INDEPENDENT_AMBULATORY_CARE_PROVIDER_SITE_OTHER): Payer: Medicare Other | Admitting: Vascular Surgery

## 2021-08-31 ENCOUNTER — Encounter: Payer: Self-pay | Admitting: Vascular Surgery

## 2021-08-31 ENCOUNTER — Ambulatory Visit (HOSPITAL_COMMUNITY)
Admission: RE | Admit: 2021-08-31 | Discharge: 2021-08-31 | Disposition: A | Discharge status: Home / Self Care | Payer: Medicare Other | Source: Ambulatory Visit | Attending: Vascular Surgery | Admitting: Vascular Surgery

## 2021-08-31 VITALS — BP 107/55 | HR 97 | Temp 97.3°F | Resp 16 | Ht 70.0 in | Wt 191.0 lb

## 2021-08-31 DIAGNOSIS — Z992 Dependence on renal dialysis: Secondary | ICD-10-CM

## 2021-08-31 DIAGNOSIS — N186 End stage renal disease: Secondary | ICD-10-CM

## 2021-08-31 NOTE — TOC Transition Note (Signed)
Transition of care contact from inpatient facility  Date of discharge: 08/29/21 Date of contact: 08/31/21 Method: Phone Spoke to: Patient  Patient contacted to discuss transition of care from recent inpatient hospitalization. Patient was admitted to [facility] from 08/19/21-08/29/21  with discharge diagnosis of SVT, Cirrhosis, NASH, s/p peritoneal catheter placement for ongoing paracentesis.  Medication changes were reviewed.  Patient received HD at [facility] on 08/30/21.   Tobie Poet, NP

## 2021-08-31 NOTE — Progress Notes (Signed)
Patient name: Gregory Tanner MRN: [id number] DOB: [birth year] Sex: male  REASON FOR VISIT: Postop check  HPI: Gregory Tanner is a 66 y.o. male with end-stage renal disease, NASH cirrhosis s/p liver transplant that underwent left radiocephalic AV fistula on 02/20/735.  Patient has no complaints today.  The incision is healed without issue.  No signs of steal in the left hand.  He can feel the thrill in the fistula.  Still on dialysis using a catheter at this time.  Past Medical History:  Diagnosis Date   Ankylosis of lumbar spine 02/21/2014   Benign essential hypertension 10/26/2015   Chronic diastolic heart failure ([place]) 09/26/2016   Chronic pain associated with significant psychosocial dysfunction 06/17/2015   Degeneration of intervertebral disc of lumbar region 07/05/2013   ESRD (end stage renal disease) on dialysis ([place]) 03/27/2017   Hearing loss    Hepatocellular carcinoma ([place]) 12/27/2010   Overview:  Embolized 6/12    History of liver transplant ([place]) 07/20/2011   Overview:  06/07/2011 for cryptogenic cirrhosis and HCC    Uncontrolled type 2 diabetes mellitus with insulin therapy 06/21/2018    Past Surgical History:  Procedure Laterality Date   AV FISTULA PLACEMENT Left 07/16/2021   Procedure: LEFT ARM RADIOCEPHALIC ARTERIOVENOUS  FISTULA CREATION;  Surgeon: [name], MD;  Location: [facility];  Service: Vascular;  Laterality: Left;   [place] STUDY  12/14/2020   Procedure: BUBBLE STUDY;  Surgeon: [name], MD;  Location: [facility];  Service: Cardiovascular;;   EYE SURGERY Left    HERNIA REPAIR     IR PARACENTESIS  11/30/2020   IR PARACENTESIS  05/20/2021   IR PARACENTESIS  06/01/2021   IR PARACENTESIS  06/17/2021   IR PARACENTESIS  07/13/2021   IR PARACENTESIS  07/20/2021   IR PARACENTESIS  07/27/2021   IR PARACENTESIS  07/30/2021   IR PARACENTESIS  08/02/2021   IR PARACENTESIS  08/09/2021   IR  PERC PLEURAL DRAIN W/INDWELL CATH W/IMG GUIDE  08/27/2021   LIVER SURGERY     IMPLANT   LIVER TRANSPLANT     RIGHT HEART CATH N/A 12/11/2020   Procedure: RIGHT HEART CATH;  Surgeon: [name], MD;  Location: [facility] CV LAB;  Service: Cardiovascular;  Laterality: N/A;   TEE WITHOUT CARDIOVERSION N/A 12/14/2020   Procedure: TRANSESOPHAGEAL ECHOCARDIOGRAM (TEE);  Surgeon: [name], MD;  Location: [facility] ENDOSCOPY;  Service: Cardiovascular;  Laterality: N/A;   TONSILLECTOMY      Family History  Problem Relation Age of Onset   Cancer Mother    Diabetes Father    Heart attack Father    Schizophrenia Father    Hypertension Brother     SOCIAL HISTORY: Social History   Tobacco Use   Smoking status: Never   Smokeless tobacco: Never  Substance Use Topics   Alcohol use: Yes    Alcohol/week: 0.0 standard drinks of alcohol    Comment: ocassional    Allergies  Allergen Reactions   Iodinated Contrast Media Hives and Other (See Comments)    Allergy is NOT to all contrast - but patient is unsure of which particular one his allergy is to. Contrast dye used before liver transplant cause hives, not sure which dye this was but is able to use others   Jardiance [Empagliflozin] Dermatitis    developed blisters with the medication, blisters  stopped after he  discontinued taking it. (About 6 months ago /~06/2020)   Latex Other (See Comments)    Skin peeling - adhesive    Metformin And Related Nausea And Vomiting   Other     Patient states that he is allergic to "some antibiotic" but is not sure of the name of it.   Rosiglitazone Nausea And Vomiting    GI effects and abdominal pain    Current Outpatient Medications  Medication Sig Dispense Refill   acetaminophen (TYLENOL) 325 MG tablet Take 650 mg by mouth every 6 (six) hours as needed for mild pain.     allopurinol (ZYLOPRIM) 300 MG tablet Take 150 mg by mouth daily.     amiodarone (PACERONE) 200 MG tablet Take 1 tablet twice  daily until 09/08/21, then on 09/09/21 start taking 1 tablet daily. 60 tablet 0   bumetanide (BUMEX) 2 MG tablet Take 4 mg by mouth See admin instructions. Take 4 mg twice daily on non dialysis days. Tuesday, Thursday, and Sunday.     calcium carbonate (TUMS - DOSED IN MG ELEMENTAL CALCIUM) 500 MG chewable tablet Chew 1 tablet by mouth daily as needed for indigestion or heartburn.     insulin lispro (HUMALOG) 100 UNIT/ML KwikPen Inject 2-24 Units into the skin See admin instructions. Sliding scale 150-200= 2 units 201-250=3 units 251-300=4 units 301-350=5 units 8 units for small meals 24 units for High carbohydrates     midodrine (PROAMATINE) 10 MG tablet Take 10 mg by mouth See admin instructions. Take '10mg'$  before dialysis on Monday, Wednesday, Friday, and  some Saturday depend on food retention     Multiple Vitamin (MULTIVITAMIN) tablet Take 1 tablet by mouth daily.     mycophenolate (CELLCEPT) 250 MG capsule Take 4 capsules (1,000 mg total) by mouth in the morning and at bedtime. 240 capsule 0   TART CHERRY PO Take 1 tablet by mouth in the morning and at bedtime.     No current facility-administered medications for this visit.    REVIEW OF SYSTEMS:  '[X]'$  denotes positive finding, '[ ]'$  denotes negative finding Cardiac  Comments:  Chest pain or chest pressure:    Shortness of breath upon exertion:    Short of breath when lying flat:    Irregular heart rhythm:        Vascular    Pain in calf, thigh, or hip brought on by ambulation:    Pain in feet at night that wakes you up from your sleep:     Blood clot in your veins:    Leg swelling:         Pulmonary    Oxygen at home:    Productive cough:     Wheezing:         Neurologic    Sudden weakness in arms or legs:     Sudden numbness in arms or legs:     Sudden onset of difficulty speaking or slurred speech:    Temporary loss of vision in one eye:     Problems with dizziness:         Gastrointestinal    Blood in stool:      Vomited blood:         Genitourinary    Burning when urinating:     Blood in urine:        Psychiatric    Major depression:         Hematologic    Bleeding problems:    Problems with blood clotting too  easily:        Skin    Rashes or ulcers:        Constitutional    Fever or chills:      PHYSICAL EXAM: Vitals:   08/31/21 1343  BP: (!) 107/55  Pulse: 97  Resp: 16  Temp: (!) 97.3 F (36.3 C)  TempSrc: Temporal  SpO2: 98%  Weight: 191 lb (86.6 kg)  Height: '5\' 10"'$  (1.778 m)    GENERAL: The patient is a well-nourished male, in no acute distress. The vital signs are documented above. CARDIAC: There is a regular rate and rhythm.  VASCULAR:  Left radiocephalic AV fistula with excellent thrill Incision at left wrist well-healed  DATA:   Fistula duplex today shows fistula measures about 5.5 mm with flow volume 565 mL/min  Assessment/Plan:  66 year old male with end-stage renal disease that presents for postop check after left radiocephalic AV fistula on 04/14/5398.  Fistula has an excellent thrill on exam.  No signs of steal.  It is very superficial and you can easily see it on exam.  Very pleased with his progress.  Discussed he can continue to exercise the left upper extremity with an exercise ball to help the fistula mature.  Discussed he can use this once he is 12 weeks postop which is about 10/16/21.  Discussed he call with questions or concerns.  Hopefully once the fistula works in dialysis he can get his catheter out.   [name], MD [facility] [place] Office: [phone]

## 2021-09-01 DIAGNOSIS — Z992 Dependence on renal dialysis: Secondary | ICD-10-CM | POA: Diagnosis not present

## 2021-09-01 DIAGNOSIS — D509 Iron deficiency anemia, unspecified: Secondary | ICD-10-CM | POA: Diagnosis not present

## 2021-09-01 DIAGNOSIS — E1122 Type 2 diabetes mellitus with diabetic chronic kidney disease: Secondary | ICD-10-CM | POA: Diagnosis not present

## 2021-09-01 DIAGNOSIS — D689 Coagulation defect, unspecified: Secondary | ICD-10-CM | POA: Diagnosis not present

## 2021-09-01 DIAGNOSIS — N186 End stage renal disease: Secondary | ICD-10-CM | POA: Diagnosis not present

## 2021-09-01 DIAGNOSIS — N2581 Secondary hyperparathyroidism of renal origin: Secondary | ICD-10-CM | POA: Diagnosis not present

## 2021-09-03 DIAGNOSIS — E1122 Type 2 diabetes mellitus with diabetic chronic kidney disease: Secondary | ICD-10-CM | POA: Diagnosis not present

## 2021-09-03 DIAGNOSIS — D509 Iron deficiency anemia, unspecified: Secondary | ICD-10-CM | POA: Diagnosis not present

## 2021-09-03 DIAGNOSIS — N2581 Secondary hyperparathyroidism of renal origin: Secondary | ICD-10-CM | POA: Diagnosis not present

## 2021-09-03 DIAGNOSIS — Z992 Dependence on renal dialysis: Secondary | ICD-10-CM | POA: Diagnosis not present

## 2021-09-03 DIAGNOSIS — N186 End stage renal disease: Secondary | ICD-10-CM | POA: Diagnosis not present

## 2021-09-03 DIAGNOSIS — D689 Coagulation defect, unspecified: Secondary | ICD-10-CM | POA: Diagnosis not present

## 2021-09-06 DIAGNOSIS — N2581 Secondary hyperparathyroidism of renal origin: Secondary | ICD-10-CM | POA: Diagnosis not present

## 2021-09-06 DIAGNOSIS — N186 End stage renal disease: Secondary | ICD-10-CM | POA: Diagnosis not present

## 2021-09-06 DIAGNOSIS — D509 Iron deficiency anemia, unspecified: Secondary | ICD-10-CM | POA: Diagnosis not present

## 2021-09-06 DIAGNOSIS — E1122 Type 2 diabetes mellitus with diabetic chronic kidney disease: Secondary | ICD-10-CM | POA: Diagnosis not present

## 2021-09-06 DIAGNOSIS — D689 Coagulation defect, unspecified: Secondary | ICD-10-CM | POA: Diagnosis not present

## 2021-09-06 DIAGNOSIS — Z992 Dependence on renal dialysis: Secondary | ICD-10-CM | POA: Diagnosis not present

## 2021-09-07 DIAGNOSIS — Z6828 Body mass index (BMI) 28.0-28.9, adult: Secondary | ICD-10-CM | POA: Diagnosis not present

## 2021-09-07 DIAGNOSIS — Z992 Dependence on renal dialysis: Secondary | ICD-10-CM | POA: Diagnosis not present

## 2021-09-08 DIAGNOSIS — D509 Iron deficiency anemia, unspecified: Secondary | ICD-10-CM | POA: Diagnosis not present

## 2021-09-08 DIAGNOSIS — N2581 Secondary hyperparathyroidism of renal origin: Secondary | ICD-10-CM | POA: Diagnosis not present

## 2021-09-08 DIAGNOSIS — N186 End stage renal disease: Secondary | ICD-10-CM | POA: Diagnosis not present

## 2021-09-08 DIAGNOSIS — Z992 Dependence on renal dialysis: Secondary | ICD-10-CM | POA: Diagnosis not present

## 2021-09-08 DIAGNOSIS — D689 Coagulation defect, unspecified: Secondary | ICD-10-CM | POA: Diagnosis not present

## 2021-09-08 DIAGNOSIS — E1122 Type 2 diabetes mellitus with diabetic chronic kidney disease: Secondary | ICD-10-CM | POA: Diagnosis not present

## 2021-09-09 ENCOUNTER — Other Ambulatory Visit: Payer: Self-pay

## 2021-09-09 NOTE — Patient Outreach (Signed)
  Care Coordination   Outreach  Visit Note   09/09/2021 Name: Gregory Tanner MRN: [id number] DOB: 1955-05-05  Gregory Tanner is a 66 y.o. year old male who sees Gregory Hipps, MD for primary care. I spoke with  Gregory Tanner by phone today  What matters to the patients health and wellness today?  Explained program and offered services.  Reviewed the ability to have a LCSW, pharmacist and Nurse. He states that he has a home health nurse who is addressing all his needs.  Encouraged patient to discuss with MD and further needs and send a referral.  Patient agreed.   SDOH assessments and interventions completed:   No   Care Coordination Interventions Activated:  No Care Coordination Interventions:  No, not indicated  Follow up plan:  Patient will inform MD if he needs services in the future.  Encounter Outcome:  Pt. Refused  Gregory Rand, RN, BSN, CEN [facility] [employer] [phone]

## 2021-09-10 DIAGNOSIS — D689 Coagulation defect, unspecified: Secondary | ICD-10-CM | POA: Diagnosis not present

## 2021-09-10 DIAGNOSIS — N186 End stage renal disease: Secondary | ICD-10-CM | POA: Diagnosis not present

## 2021-09-10 DIAGNOSIS — Z992 Dependence on renal dialysis: Secondary | ICD-10-CM | POA: Diagnosis not present

## 2021-09-10 DIAGNOSIS — D509 Iron deficiency anemia, unspecified: Secondary | ICD-10-CM | POA: Diagnosis not present

## 2021-09-10 DIAGNOSIS — E1122 Type 2 diabetes mellitus with diabetic chronic kidney disease: Secondary | ICD-10-CM | POA: Diagnosis not present

## 2021-09-10 DIAGNOSIS — N2581 Secondary hyperparathyroidism of renal origin: Secondary | ICD-10-CM | POA: Diagnosis not present

## 2021-09-13 DIAGNOSIS — N179 Acute kidney failure, unspecified: Secondary | ICD-10-CM | POA: Diagnosis not present

## 2021-09-13 DIAGNOSIS — N2581 Secondary hyperparathyroidism of renal origin: Secondary | ICD-10-CM | POA: Diagnosis not present

## 2021-09-13 DIAGNOSIS — E1122 Type 2 diabetes mellitus with diabetic chronic kidney disease: Secondary | ICD-10-CM | POA: Diagnosis not present

## 2021-09-13 DIAGNOSIS — Z992 Dependence on renal dialysis: Secondary | ICD-10-CM | POA: Diagnosis not present

## 2021-09-13 DIAGNOSIS — N186 End stage renal disease: Secondary | ICD-10-CM | POA: Diagnosis not present

## 2021-09-13 DIAGNOSIS — D689 Coagulation defect, unspecified: Secondary | ICD-10-CM | POA: Diagnosis not present

## 2021-09-13 DIAGNOSIS — D509 Iron deficiency anemia, unspecified: Secondary | ICD-10-CM | POA: Diagnosis not present

## 2021-09-20 ENCOUNTER — Inpatient Hospital Stay (HOSPITAL_COMMUNITY): Payer: Medicare Other

## 2021-09-20 ENCOUNTER — Emergency Department (HOSPITAL_COMMUNITY): Payer: Medicare Other

## 2021-09-20 ENCOUNTER — Encounter (HOSPITAL_COMMUNITY): Payer: Self-pay

## 2021-09-20 ENCOUNTER — Inpatient Hospital Stay (HOSPITAL_COMMUNITY)
Admission: EM | Admit: 2021-09-20 | Discharge: 2021-09-27 | DRG: 871 | Disposition: A | Discharge status: Hospice | Payer: Medicare Other | Attending: Internal Medicine | Admitting: Internal Medicine

## 2021-09-20 ENCOUNTER — Other Ambulatory Visit: Payer: Self-pay

## 2021-09-20 DIAGNOSIS — R6521 Severe sepsis with septic shock: Secondary | ICD-10-CM | POA: Diagnosis present

## 2021-09-20 DIAGNOSIS — I5021 Acute systolic (congestive) heart failure: Secondary | ICD-10-CM | POA: Diagnosis not present

## 2021-09-20 DIAGNOSIS — N186 End stage renal disease: Secondary | ICD-10-CM

## 2021-09-20 DIAGNOSIS — N179 Acute kidney failure, unspecified: Secondary | ICD-10-CM | POA: Diagnosis present

## 2021-09-20 DIAGNOSIS — Z4803 Encounter for change or removal of drains: Secondary | ICD-10-CM | POA: Diagnosis not present

## 2021-09-20 DIAGNOSIS — E1122 Type 2 diabetes mellitus with diabetic chronic kidney disease: Secondary | ICD-10-CM | POA: Diagnosis present

## 2021-09-20 DIAGNOSIS — I953 Hypotension of hemodialysis: Secondary | ICD-10-CM | POA: Diagnosis present

## 2021-09-20 DIAGNOSIS — I471 Supraventricular tachycardia, unspecified: Secondary | ICD-10-CM | POA: Diagnosis present

## 2021-09-20 DIAGNOSIS — I484 Atypical atrial flutter: Secondary | ICD-10-CM

## 2021-09-20 DIAGNOSIS — D631 Anemia in chronic kidney disease: Secondary | ICD-10-CM | POA: Diagnosis present

## 2021-09-20 DIAGNOSIS — I452 Bifascicular block: Secondary | ICD-10-CM | POA: Diagnosis present

## 2021-09-20 DIAGNOSIS — Z4682 Encounter for fitting and adjustment of non-vascular catheter: Secondary | ICD-10-CM | POA: Diagnosis not present

## 2021-09-20 DIAGNOSIS — E8779 Other fluid overload: Secondary | ICD-10-CM | POA: Diagnosis not present

## 2021-09-20 DIAGNOSIS — J9690 Respiratory failure, unspecified, unspecified whether with hypoxia or hypercapnia: Secondary | ICD-10-CM | POA: Diagnosis not present

## 2021-09-20 DIAGNOSIS — Z8505 Personal history of malignant neoplasm of liver: Secondary | ICD-10-CM

## 2021-09-20 DIAGNOSIS — Z833 Family history of diabetes mellitus: Secondary | ICD-10-CM

## 2021-09-20 DIAGNOSIS — I50810 Right heart failure, unspecified: Secondary | ICD-10-CM

## 2021-09-20 DIAGNOSIS — I4891 Unspecified atrial fibrillation: Secondary | ICD-10-CM | POA: Diagnosis present

## 2021-09-20 DIAGNOSIS — Z66 Do not resuscitate: Secondary | ICD-10-CM | POA: Diagnosis not present

## 2021-09-20 DIAGNOSIS — I10 Essential (primary) hypertension: Secondary | ICD-10-CM | POA: Diagnosis not present

## 2021-09-20 DIAGNOSIS — Z7189 Other specified counseling: Secondary | ICD-10-CM

## 2021-09-20 DIAGNOSIS — I2781 Cor pulmonale (chronic): Secondary | ICD-10-CM | POA: Diagnosis present

## 2021-09-20 DIAGNOSIS — E119 Type 2 diabetes mellitus without complications: Secondary | ICD-10-CM

## 2021-09-20 DIAGNOSIS — Z809 Family history of malignant neoplasm, unspecified: Secondary | ICD-10-CM

## 2021-09-20 DIAGNOSIS — I472 Ventricular tachycardia, unspecified: Secondary | ICD-10-CM | POA: Diagnosis not present

## 2021-09-20 DIAGNOSIS — Z818 Family history of other mental and behavioral disorders: Secondary | ICD-10-CM

## 2021-09-20 DIAGNOSIS — D696 Thrombocytopenia, unspecified: Secondary | ICD-10-CM | POA: Diagnosis not present

## 2021-09-20 DIAGNOSIS — I5043 Acute on chronic combined systolic (congestive) and diastolic (congestive) heart failure: Secondary | ICD-10-CM | POA: Diagnosis present

## 2021-09-20 DIAGNOSIS — I4892 Unspecified atrial flutter: Secondary | ICD-10-CM

## 2021-09-20 DIAGNOSIS — R42 Dizziness and giddiness: Secondary | ICD-10-CM | POA: Diagnosis not present

## 2021-09-20 DIAGNOSIS — Z91041 Radiographic dye allergy status: Secondary | ICD-10-CM

## 2021-09-20 DIAGNOSIS — J9601 Acute respiratory failure with hypoxia: Secondary | ICD-10-CM

## 2021-09-20 DIAGNOSIS — J811 Chronic pulmonary edema: Secondary | ICD-10-CM | POA: Diagnosis not present

## 2021-09-20 DIAGNOSIS — Z515 Encounter for palliative care: Secondary | ICD-10-CM | POA: Diagnosis not present

## 2021-09-20 DIAGNOSIS — I071 Rheumatic tricuspid insufficiency: Secondary | ICD-10-CM | POA: Diagnosis present

## 2021-09-20 DIAGNOSIS — E785 Hyperlipidemia, unspecified: Secondary | ICD-10-CM | POA: Diagnosis present

## 2021-09-20 DIAGNOSIS — T8642 Liver transplant failure: Secondary | ICD-10-CM | POA: Diagnosis not present

## 2021-09-20 DIAGNOSIS — Z794 Long term (current) use of insulin: Secondary | ICD-10-CM

## 2021-09-20 DIAGNOSIS — R570 Cardiogenic shock: Secondary | ICD-10-CM | POA: Diagnosis not present

## 2021-09-20 DIAGNOSIS — E871 Hypo-osmolality and hyponatremia: Secondary | ICD-10-CM | POA: Diagnosis present

## 2021-09-20 DIAGNOSIS — I132 Hypertensive heart and chronic kidney disease with heart failure and with stage 5 chronic kidney disease, or end stage renal disease: Secondary | ICD-10-CM | POA: Diagnosis present

## 2021-09-20 DIAGNOSIS — I9589 Other hypotension: Secondary | ICD-10-CM | POA: Diagnosis present

## 2021-09-20 DIAGNOSIS — I2721 Secondary pulmonary arterial hypertension: Secondary | ICD-10-CM | POA: Diagnosis present

## 2021-09-20 DIAGNOSIS — I509 Heart failure, unspecified: Secondary | ICD-10-CM | POA: Diagnosis not present

## 2021-09-20 DIAGNOSIS — R000 Tachycardia, unspecified: Principal | ICD-10-CM

## 2021-09-20 DIAGNOSIS — Z944 Liver transplant status: Secondary | ICD-10-CM

## 2021-09-20 DIAGNOSIS — Z7960 Long term (current) use of unspecified immunomodulators and immunosuppressants: Secondary | ICD-10-CM

## 2021-09-20 DIAGNOSIS — R0600 Dyspnea, unspecified: Secondary | ICD-10-CM | POA: Diagnosis not present

## 2021-09-20 DIAGNOSIS — I5033 Acute on chronic diastolic (congestive) heart failure: Secondary | ICD-10-CM | POA: Diagnosis present

## 2021-09-20 DIAGNOSIS — R079 Chest pain, unspecified: Secondary | ICD-10-CM | POA: Diagnosis not present

## 2021-09-20 DIAGNOSIS — Z992 Dependence on renal dialysis: Secondary | ICD-10-CM | POA: Diagnosis not present

## 2021-09-20 DIAGNOSIS — I5032 Chronic diastolic (congestive) heart failure: Secondary | ICD-10-CM | POA: Diagnosis not present

## 2021-09-20 DIAGNOSIS — R188 Other ascites: Secondary | ICD-10-CM | POA: Diagnosis present

## 2021-09-20 DIAGNOSIS — A419 Sepsis, unspecified organism: Principal | ICD-10-CM

## 2021-09-20 DIAGNOSIS — I5084 End stage heart failure: Secondary | ICD-10-CM | POA: Diagnosis present

## 2021-09-20 DIAGNOSIS — Z8249 Family history of ischemic heart disease and other diseases of the circulatory system: Secondary | ICD-10-CM

## 2021-09-20 DIAGNOSIS — E875 Hyperkalemia: Secondary | ICD-10-CM | POA: Diagnosis not present

## 2021-09-20 DIAGNOSIS — R0602 Shortness of breath: Secondary | ICD-10-CM | POA: Diagnosis not present

## 2021-09-20 DIAGNOSIS — Z79899 Other long term (current) drug therapy: Secondary | ICD-10-CM

## 2021-09-20 DIAGNOSIS — R0789 Other chest pain: Secondary | ICD-10-CM | POA: Diagnosis not present

## 2021-09-20 DIAGNOSIS — Z91040 Latex allergy status: Secondary | ICD-10-CM

## 2021-09-20 DIAGNOSIS — H9190 Unspecified hearing loss, unspecified ear: Secondary | ICD-10-CM | POA: Diagnosis present

## 2021-09-20 DIAGNOSIS — J9811 Atelectasis: Secondary | ICD-10-CM | POA: Diagnosis not present

## 2021-09-20 DIAGNOSIS — M898X9 Other specified disorders of bone, unspecified site: Secondary | ICD-10-CM | POA: Diagnosis present

## 2021-09-20 DIAGNOSIS — G894 Chronic pain syndrome: Secondary | ICD-10-CM | POA: Diagnosis present

## 2021-09-20 DIAGNOSIS — Z888 Allergy status to other drugs, medicaments and biological substances status: Secondary | ICD-10-CM

## 2021-09-20 HISTORY — PX: IR RADIOLOGIST EVAL & MGMT: IMG5224

## 2021-09-20 HISTORY — PX: IR PATIENT EVAL TECH 0-60 MINS: IMG5564

## 2021-09-20 LAB — CBC WITH DIFFERENTIAL/PLATELET
Abs Immature Granulocytes: 0.77 10*3/uL — ABNORMAL HIGH (ref 0.00–0.07)
Basophils Absolute: 0.1 10*3/uL (ref 0.0–0.1)
Basophils Relative: 1 %
Eosinophils Absolute: 0 10*3/uL (ref 0.0–0.5)
Eosinophils Relative: 0 %
HCT: 40.1 % (ref 39.0–52.0)
Hemoglobin: 13.1 g/dL (ref 13.0–17.0)
Immature Granulocytes: 6 %
Lymphocytes Relative: 5 %
Lymphs Abs: 0.6 10*3/uL — ABNORMAL LOW (ref 0.7–4.0)
MCH: 31.3 pg (ref 26.0–34.0)
MCHC: 32.7 g/dL (ref 30.0–36.0)
MCV: 95.7 fL (ref 80.0–100.0)
Monocytes Absolute: 0.6 10*3/uL (ref 0.1–1.0)
Monocytes Relative: 5 %
Neutro Abs: 10.4 10*3/uL — ABNORMAL HIGH (ref 1.7–7.7)
Neutrophils Relative %: 83 %
Platelets: 220 10*3/uL (ref 150–400)
RBC: 4.19 MIL/uL — ABNORMAL LOW (ref 4.22–5.81)
RDW: 16 % — ABNORMAL HIGH (ref 11.5–15.5)
Smear Review: NORMAL
WBC: 12.4 10*3/uL — ABNORMAL HIGH (ref 4.0–10.5)
nRBC: 0 % (ref 0.0–0.2)

## 2021-09-20 LAB — I-STAT ARTERIAL BLOOD GAS, ED
Acid-base deficit: 9 mmol/L — ABNORMAL HIGH (ref 0.0–2.0)
Bicarbonate: 15.8 mmol/L — ABNORMAL LOW (ref 20.0–28.0)
Calcium, Ion: 1.07 mmol/L — ABNORMAL LOW (ref 1.15–1.40)
HCT: 40 % (ref 39.0–52.0)
Hemoglobin: 13.6 g/dL (ref 13.0–17.0)
O2 Saturation: 100 %
Patient temperature: 98.6
Potassium: 6.1 mmol/L — ABNORMAL HIGH (ref 3.5–5.1)
Sodium: 116 mmol/L — CL (ref 135–145)
TCO2: 17 mmol/L — ABNORMAL LOW (ref 22–32)
pCO2 arterial: 31.5 mmHg — ABNORMAL LOW (ref 32–48)
pH, Arterial: 7.308 — ABNORMAL LOW (ref 7.35–7.45)
pO2, Arterial: 457 mmHg — ABNORMAL HIGH (ref 83–108)

## 2021-09-20 LAB — I-STAT VENOUS BLOOD GAS, ED
Acid-base deficit: 4 mmol/L — ABNORMAL HIGH (ref 0.0–2.0)
Bicarbonate: 22.4 mmol/L (ref 20.0–28.0)
Calcium, Ion: 1.08 mmol/L — ABNORMAL LOW (ref 1.15–1.40)
HCT: 42 % (ref 39.0–52.0)
Hemoglobin: 14.3 g/dL (ref 13.0–17.0)
O2 Saturation: 69 %
Potassium: 6.8 mmol/L (ref 3.5–5.1)
Sodium: 119 mmol/L — CL (ref 135–145)
TCO2: 24 mmol/L (ref 22–32)
pCO2, Ven: 45.5 mmHg (ref 44–60)
pH, Ven: 7.3 (ref 7.25–7.43)
pO2, Ven: 40 mmHg (ref 32–45)

## 2021-09-20 LAB — COMPREHENSIVE METABOLIC PANEL
ALT: 24 U/L (ref 0–44)
AST: 20 U/L (ref 15–41)
Albumin: 3.4 g/dL — ABNORMAL LOW (ref 3.5–5.0)
Alkaline Phosphatase: 134 U/L — ABNORMAL HIGH (ref 38–126)
Anion gap: 14 (ref 5–15)
BUN: 49 mg/dL — ABNORMAL HIGH (ref 8–23)
CO2: 20 mmol/L — ABNORMAL LOW (ref 22–32)
Calcium: 8.8 mg/dL — ABNORMAL LOW (ref 8.9–10.3)
Chloride: 88 mmol/L — ABNORMAL LOW (ref 98–111)
Creatinine, Ser: 5.28 mg/dL — ABNORMAL HIGH (ref 0.61–1.24)
GFR, Estimated: 11 mL/min — ABNORMAL LOW (ref >60)
Glucose, Bld: 150 mg/dL — ABNORMAL HIGH (ref 70–99)
Potassium: 6.7 mmol/L (ref 3.5–5.1)
Sodium: 122 mmol/L — ABNORMAL LOW (ref 135–145)
Total Bilirubin: 0.9 mg/dL (ref 0.3–1.2)
Total Protein: 5.6 g/dL — ABNORMAL LOW (ref 6.5–8.1)

## 2021-09-20 LAB — MAGNESIUM: Magnesium: 2.2 mg/dL (ref 1.7–2.4)

## 2021-09-20 LAB — PROTIME-INR
INR: 1.2 (ref 0.8–1.2)
Prothrombin Time: 14.7 seconds (ref 11.4–15.2)

## 2021-09-20 LAB — TROPONIN I (HIGH SENSITIVITY): Troponin I (High Sensitivity): 30 ng/L — ABNORMAL HIGH (ref <18)

## 2021-09-20 LAB — HEPATITIS C ANTIBODY: HCV Ab: NONREACTIVE

## 2021-09-20 LAB — CBG MONITORING, ED
Glucose-Capillary: 155 mg/dL — ABNORMAL HIGH (ref 70–99)
Glucose-Capillary: 193 mg/dL — ABNORMAL HIGH (ref 70–99)

## 2021-09-20 LAB — HEPATITIS B SURFACE ANTIGEN: Hepatitis B Surface Ag: NONREACTIVE

## 2021-09-20 LAB — POC OCCULT BLOOD, ED: Fecal Occult Bld: NEGATIVE

## 2021-09-20 LAB — HEPATITIS B SURFACE ANTIBODY,QUALITATIVE: Hep B S Ab: NONREACTIVE

## 2021-09-20 LAB — PHOSPHORUS: Phosphorus: 7.7 mg/dL — ABNORMAL HIGH (ref 2.5–4.6)

## 2021-09-20 LAB — HEPATITIS B CORE ANTIBODY, TOTAL: Hep B Core Total Ab: NONREACTIVE

## 2021-09-20 LAB — BRAIN NATRIURETIC PEPTIDE: B Natriuretic Peptide: 1376.7 pg/mL — ABNORMAL HIGH (ref 0.0–100.0)

## 2021-09-20 MED ORDER — CALCIUM GLUCONATE 10 % IV SOLN
1.0000 g | Freq: Once | INTRAVENOUS | Status: AC
  Administered 2021-09-20: 1 g via INTRAVENOUS
  Filled 2021-09-20: qty 10

## 2021-09-20 MED ORDER — DOCUSATE SODIUM 100 MG PO CAPS: 100.0000 mg | ORAL_CAPSULE | Freq: Two times a day (BID) | ORAL | Status: DC | PRN

## 2021-09-20 MED ORDER — SODIUM CHLORIDE 0.9 % IV BOLUS (SEPSIS): 1000.0000 mL | Freq: Once | INTRAVENOUS | Status: DC

## 2021-09-20 MED ORDER — SODIUM BICARBONATE 8.4 % IV SOLN
INTRAVENOUS | Status: DC | PRN
  Administered 2021-09-20: 50 meq via INTRAVENOUS

## 2021-09-20 MED ORDER — PRISMASOL BGK 0/2.5 32-2.5 MEQ/L EC SOLN
Status: DC
  Filled 2021-09-20 (×8): qty 5000

## 2021-09-20 MED ORDER — PRISMASOL BGK 0/2.5 32-2.5 MEQ/L EC SOLN
Status: DC
  Filled 2021-09-20 (×15): qty 5000

## 2021-09-20 MED ORDER — ONDANSETRON HCL 4 MG/2ML IJ SOLN
4.0000 mg | Freq: Four times a day (QID) | INTRAMUSCULAR | Status: DC | PRN
  Administered 2021-09-20 – 2021-09-25 (×3): 4 mg via INTRAVENOUS
  Filled 2021-09-20 (×2): qty 2

## 2021-09-20 MED ORDER — AMIODARONE HCL IN DEXTROSE 360-4.14 MG/200ML-% IV SOLN
60.0000 mg/h | INTRAVENOUS | Status: AC
  Administered 2021-09-20 (×2): 60 mg/h via INTRAVENOUS
  Filled 2021-09-20 (×2): qty 200

## 2021-09-20 MED ORDER — MIDODRINE HCL 5 MG PO TABS
10.0000 mg | ORAL_TABLET | ORAL | Status: DC
Start: 2021-09-22 — End: 2021-09-21

## 2021-09-20 MED ORDER — SODIUM ZIRCONIUM CYCLOSILICATE 10 G PO PACK
10.0000 g | PACK | Freq: Once | ORAL | Status: AC
  Administered 2021-09-20: 10 g via ORAL
  Filled 2021-09-20: qty 1

## 2021-09-20 MED ORDER — DEXTROSE 50 % IV SOLN
1.0000 | Freq: Once | INTRAVENOUS | Status: AC
  Administered 2021-09-20: 50 mL via INTRAVENOUS
  Filled 2021-09-20: qty 50

## 2021-09-20 MED ORDER — HYDROXYZINE HCL 25 MG PO TABS
25.0000 mg | ORAL_TABLET | Freq: Three times a day (TID) | ORAL | Status: DC | PRN
  Administered 2021-09-22 – 2021-09-24 (×3): 25 mg via ORAL
  Filled 2021-09-20 (×3): qty 1

## 2021-09-20 MED ORDER — SODIUM CHLORIDE 0.9 % IV SOLN
250.0000 mL | INTRAVENOUS | Status: DC
  Administered 2021-09-24: 250 mL via INTRAVENOUS

## 2021-09-20 MED ORDER — SODIUM CHLORIDE 0.9 % IV BOLUS (SEPSIS)
500.0000 mL | Freq: Once | INTRAVENOUS | Status: AC
  Administered 2021-09-20: 500 mL via INTRAVENOUS

## 2021-09-20 MED ORDER — HEPARIN (PORCINE) 25000 UT/250ML-% IV SOLN
1850.0000 [IU]/h | INTRAVENOUS | Status: DC
  Administered 2021-09-20: 1300 [IU]/h via INTRAVENOUS
  Administered 2021-09-23 (×2): 1450 [IU]/h via INTRAVENOUS
  Administered 2021-09-24: 1550 [IU]/h via INTRAVENOUS
  Administered 2021-09-25: 1850 [IU]/h via INTRAVENOUS
  Filled 2021-09-20 (×7): qty 250

## 2021-09-20 MED ORDER — ALLOPURINOL 300 MG PO TABS
150.0000 mg | ORAL_TABLET | Freq: Every morning | ORAL | Status: DC
  Administered 2021-09-20: 150 mg via ORAL
  Filled 2021-09-20: qty 2
  Filled 2021-09-20: qty 1

## 2021-09-20 MED ORDER — FENTANYL CITRATE PF 50 MCG/ML IJ SOSY
PREFILLED_SYRINGE | INTRAMUSCULAR | Status: AC
  Filled 2021-09-20: qty 1

## 2021-09-20 MED ORDER — ACETAMINOPHEN 650 MG RE SUPP: 650.0000 mg | Freq: Four times a day (QID) | RECTAL | Status: DC | PRN

## 2021-09-20 MED ORDER — POLYETHYLENE GLYCOL 3350 17 G PO PACK: 17.0000 g | PACK | Freq: Every day | ORAL | Status: DC | PRN

## 2021-09-20 MED ORDER — SODIUM CHLORIDE 0.9 % IV SOLN: 12.5000 mg | Freq: Four times a day (QID) | INTRAVENOUS | Status: DC | PRN

## 2021-09-20 MED ORDER — MIDAZOLAM HCL 2 MG/2ML IJ SOLN
2.0000 mg | Freq: Once | INTRAMUSCULAR | Status: AC
  Administered 2021-09-20: 2 mg via INTRAVENOUS
  Filled 2021-09-20 (×2): qty 2

## 2021-09-20 MED ORDER — PRISMASOL BGK 0/2.5 32-2.5 MEQ/L EC SOLN
Status: DC
  Filled 2021-09-20 (×4): qty 5000

## 2021-09-20 MED ORDER — INSULIN ASPART 100 UNIT/ML IV SOLN
10.0000 [IU] | Freq: Once | INTRAVENOUS | Status: AC
  Administered 2021-09-20: 10 [IU] via INTRAVENOUS

## 2021-09-20 MED ORDER — SODIUM CHLORIDE 0.9 % IV SOLN: 1000.0000 mL | INTRAVENOUS | Status: DC

## 2021-09-20 MED ORDER — CHLORHEXIDINE GLUCONATE CLOTH 2 % EX PADS
6.0000 | MEDICATED_PAD | Freq: Every day | CUTANEOUS | Status: DC
Start: 2021-09-20 — End: 2021-09-24
  Administered 2021-09-21 – 2021-09-23 (×3): 6 via TOPICAL

## 2021-09-20 MED ORDER — FENTANYL CITRATE PF 50 MCG/ML IJ SOSY: 50.0000 ug | PREFILLED_SYRINGE | Freq: Once | INTRAMUSCULAR | Status: DC

## 2021-09-20 MED ORDER — NOREPINEPHRINE 4 MG/250ML-% IV SOLN
2.0000 ug/min | INTRAVENOUS | Status: DC
  Administered 2021-09-20: 2 ug/min via INTRAVENOUS
  Administered 2021-09-20: 15 ug/min via INTRAVENOUS
  Administered 2021-09-21 (×2): 16 ug/min via INTRAVENOUS
  Filled 2021-09-20 (×3): qty 250

## 2021-09-20 MED ORDER — CALCIUM CARBONATE ANTACID 1250 MG/5ML PO SUSP
500.0000 mg | Freq: Four times a day (QID) | ORAL | Status: DC | PRN
  Filled 2021-09-20 (×2): qty 5

## 2021-09-20 MED ORDER — MIDODRINE HCL 5 MG PO TABS: 20.0000 mg | ORAL_TABLET | ORAL | Status: DC

## 2021-09-20 MED ORDER — DOCUSATE SODIUM 283 MG RE ENEM
1.0000 | ENEMA | RECTAL | Status: DC | PRN
  Filled 2021-09-20: qty 1

## 2021-09-20 MED ORDER — SODIUM CHLORIDE 0.9 % FOR CRRT: INTRAVENOUS_CENTRAL | Status: DC | PRN

## 2021-09-20 MED ORDER — FENTANYL CITRATE (PF) 100 MCG/2ML IJ SOLN
INTRAMUSCULAR | Status: DC | PRN
  Administered 2021-09-20: 50 ug via INTRAVENOUS

## 2021-09-20 MED ORDER — PHENYLEPHRINE HCL-NACL 20-0.9 MG/250ML-% IV SOLN: 0.0000 ug/min | INTRAVENOUS | Status: DC

## 2021-09-20 MED ORDER — FENTANYL CITRATE PF 50 MCG/ML IJ SOSY: 200.0000 ug | PREFILLED_SYRINGE | Freq: Once | INTRAMUSCULAR | Status: AC

## 2021-09-20 MED ORDER — HEPARIN SODIUM (PORCINE) 1000 UNIT/ML DIALYSIS: 1000.0000 [IU] | INTRAMUSCULAR | Status: DC | PRN

## 2021-09-20 MED ORDER — AMIODARONE LOAD VIA INFUSION
150.0000 mg | Freq: Once | INTRAVENOUS | Status: AC
  Administered 2021-09-20: 150 mg via INTRAVENOUS
  Filled 2021-09-20: qty 83.34

## 2021-09-20 MED ORDER — FENTANYL BOLUS VIA INFUSION
25.0000 ug | INTRAVENOUS | Status: DC | PRN
  Administered 2021-09-20 – 2021-09-21 (×2): 25 ug via INTRAVENOUS
  Administered 2021-09-22: 50 ug via INTRAVENOUS

## 2021-09-20 MED ORDER — ETOMIDATE 2 MG/ML IV SOLN
20.0000 mg | Freq: Once | INTRAVENOUS | Status: AC
  Administered 2021-09-20: 20 mg via INTRAVENOUS

## 2021-09-20 MED ORDER — MIDODRINE HCL 5 MG PO TABS: 10.0000 mg | ORAL_TABLET | ORAL | Status: DC

## 2021-09-20 MED ORDER — HEPARIN SODIUM (PORCINE) 5000 UNIT/ML IJ SOLN: 5000.0000 [IU] | Freq: Once | INTRAMUSCULAR | Status: DC

## 2021-09-20 MED ORDER — INSULIN ASPART 100 UNIT/ML IJ SOLN
0.0000 [IU] | Freq: Three times a day (TID) | INTRAMUSCULAR | Status: DC
  Administered 2021-09-20 – 2021-09-25 (×3): 1 [IU] via SUBCUTANEOUS

## 2021-09-20 MED ORDER — MIDAZOLAM BOLUS VIA INFUSION: 0.0000 mg | INTRAVENOUS | Status: DC | PRN

## 2021-09-20 MED ORDER — SODIUM CHLORIDE 0.9% FLUSH
3.0000 mL | Freq: Two times a day (BID) | INTRAVENOUS | Status: DC
  Administered 2021-09-21 – 2021-09-24 (×9): 3 mL via INTRAVENOUS
  Administered 2021-09-25: 10 mL via INTRAVENOUS
  Administered 2021-09-26: 3 mL via INTRAVENOUS

## 2021-09-20 MED ORDER — MIDAZOLAM HCL 2 MG/2ML IJ SOLN: 2.0000 mg | Freq: Once | INTRAMUSCULAR | Status: DC

## 2021-09-20 MED ORDER — MIDAZOLAM-SODIUM CHLORIDE 100-0.9 MG/100ML-% IV SOLN
0.0000 mg/h | INTRAVENOUS | Status: DC
  Administered 2021-09-20: 2 mg/h via INTRAVENOUS
  Administered 2021-09-21: 1 mg/h via INTRAVENOUS
  Filled 2021-09-20 (×2): qty 100

## 2021-09-20 MED ORDER — SODIUM CHLORIDE 0.9 % IV SOLN
1.0000 g | INTRAVENOUS | Status: DC
  Administered 2021-09-20: 1 g via INTRAVENOUS
  Filled 2021-09-20: qty 10

## 2021-09-20 MED ORDER — CAMPHOR-MENTHOL 0.5-0.5 % EX LOTN
1.0000 | TOPICAL_LOTION | Freq: Three times a day (TID) | CUTANEOUS | Status: DC | PRN
  Filled 2021-09-20: qty 222

## 2021-09-20 MED ORDER — AMIODARONE HCL IN DEXTROSE 360-4.14 MG/200ML-% IV SOLN
30.0000 mg/h | INTRAVENOUS | Status: DC
  Administered 2021-09-20 (×2): 30 mg/h via INTRAVENOUS

## 2021-09-20 MED ORDER — ZOLPIDEM TARTRATE 5 MG PO TABS
5.0000 mg | ORAL_TABLET | Freq: Every evening | ORAL | Status: DC | PRN
  Administered 2021-09-23 – 2021-09-24 (×2): 5 mg via ORAL
  Filled 2021-09-20 (×2): qty 1

## 2021-09-20 MED ORDER — ROCURONIUM BROMIDE 50 MG/5ML IV SOLN
100.0000 mg | Freq: Once | INTRAVENOUS | Status: AC
  Administered 2021-09-20: 100 mg via INTRAVENOUS

## 2021-09-20 MED ORDER — NEPRO/CARBSTEADY PO LIQD
237.0000 mL | Freq: Three times a day (TID) | ORAL | Status: DC | PRN
  Filled 2021-09-20: qty 237

## 2021-09-20 MED ORDER — HYDRALAZINE HCL 20 MG/ML IJ SOLN: 5.0000 mg | INTRAMUSCULAR | Status: DC | PRN

## 2021-09-20 MED ORDER — SORBITOL 70 % SOLN
30.0000 mL | Status: DC | PRN
  Filled 2021-09-20: qty 30

## 2021-09-20 MED ORDER — CALCIUM GLUCONATE-NACL 1-0.675 GM/50ML-% IV SOLN
1.0000 g | Freq: Once | INTRAVENOUS | Status: AC
Start: 2021-09-20 — End: 2021-09-20
  Administered 2021-09-20: 1000 mg via INTRAVENOUS
  Filled 2021-09-20: qty 50

## 2021-09-20 MED ORDER — ONDANSETRON HCL 4 MG PO TABS: 4.0000 mg | ORAL_TABLET | Freq: Four times a day (QID) | ORAL | Status: DC | PRN

## 2021-09-20 MED ORDER — INSULIN ASPART 100 UNIT/ML IV SOLN
5.0000 [IU] | Freq: Once | INTRAVENOUS | Status: AC
Start: 2021-09-20 — End: 2021-09-20
  Administered 2021-09-20: 5 [IU] via INTRAVENOUS

## 2021-09-20 MED ORDER — MYCOPHENOLATE MOFETIL 250 MG PO CAPS
1000.0000 mg | ORAL_CAPSULE | Freq: Two times a day (BID) | ORAL | Status: DC
  Administered 2021-09-20 – 2021-09-21 (×2): 1000 mg via ORAL
  Filled 2021-09-20 (×4): qty 4

## 2021-09-20 MED ORDER — PHENYLEPHRINE HCL (PRESSORS) 10 MG/ML IV SOLN
INTRAVENOUS | Status: DC | PRN
  Administered 2021-09-20: .1 mg via INTRAVENOUS
  Administered 2021-09-20: 80 ug via INTRAVENOUS

## 2021-09-20 MED ORDER — ACETAMINOPHEN 325 MG PO TABS
650.0000 mg | ORAL_TABLET | Freq: Four times a day (QID) | ORAL | Status: DC | PRN
  Administered 2021-09-20 – 2021-09-24 (×4): 650 mg via ORAL
  Filled 2021-09-20 (×4): qty 2

## 2021-09-20 MED ORDER — AMIODARONE IV BOLUS ONLY 150 MG/100ML
150.0000 mg | Freq: Once | INTRAVENOUS | Status: AC
  Administered 2021-09-20: 150 mg via INTRAVENOUS
  Filled 2021-09-20: qty 100

## 2021-09-20 MED ORDER — HEPARIN SODIUM (PORCINE) 5000 UNIT/ML IJ SOLN
5000.0000 [IU] | Freq: Three times a day (TID) | INTRAMUSCULAR | Status: DC
Start: 2021-09-20 — End: 2021-09-20
  Administered 2021-09-20: 5000 [IU] via SUBCUTANEOUS
  Filled 2021-09-20: qty 1

## 2021-09-20 MED ORDER — FENTANYL 2500MCG IN NS 250ML (10MCG/ML) PREMIX INFUSION
25.0000 ug/h | INTRAVENOUS | Status: DC
  Administered 2021-09-20: 25 ug/h via INTRAVENOUS
  Administered 2021-09-22: 75 ug/h via INTRAVENOUS
  Filled 2021-09-20 (×2): qty 250

## 2021-09-20 MED ORDER — FENTANYL CITRATE PF 50 MCG/ML IJ SOSY
PREFILLED_SYRINGE | INTRAMUSCULAR | Status: AC
  Administered 2021-09-20: 100 ug via INTRAVENOUS
  Filled 2021-09-20: qty 2

## 2021-09-20 MED ORDER — LORAZEPAM 2 MG/ML IJ SOLN
0.5000 mg | INTRAMUSCULAR | Status: DC | PRN
  Administered 2021-09-25: 0.5 mg via INTRAVENOUS
  Filled 2021-09-20: qty 1

## 2021-09-20 NOTE — Procedures (Signed)
Arterial Catheter Insertion Procedure Note  Gregory Tanner  [id number]  December 18, 1955  Date:09/20/21  Time:10:54 PM    Provider Performing: Collier Bullock    Procedure: Insertion of Arterial Line [id number]) with US guidance (01561)   Indication(s) Blood pressure monitoring and/or need for frequent ABGs  Consent Unable to obtain consent due to emergent nature of procedure.  Anesthesia None   Time Out Verified patient identification, verified procedure, site/side was marked, verified correct patient position, special equipment/implants available, medications/allergies/relevant history reviewed, required imaging and test results available.   Sterile Technique Maximal sterile technique including full sterile barrier drape, hand hygiene, sterile gown, sterile gloves, mask, hair covering, sterile ultrasound probe cover (if used).   Procedure Description Area of catheter insertion was cleaned with chlorhexidine and draped in sterile fashion. With real-time ultrasound guidance an arterial catheter was placed into the left femoral artery.  Appropriate arterial tracings confirmed on monitor.     Complications/Tolerance None; patient tolerated the procedure well. First two attempts, wire did not thread.    EBL Minimal   Specimen(s) None

## 2021-09-20 NOTE — ED Notes (Signed)
Cardiology notified that the patient is having runs of v-tach

## 2021-09-20 NOTE — Code Documentation (Signed)
Provider [name] at bedside attempting to place central line for patient at this time.

## 2021-09-20 NOTE — Procedures (Signed)
Intubation Procedure Note  Gregory Tanner  [id number]  10-16-1955  Date:09/20/21  Time:830pm  Provider Performing:Kalysta Kneisley [name]    Procedure: Intubation (31500)  Indication(s) Respiratory Failure  Consent Risks of the procedure as well as the alternatives and risks of each were explained to the patient and/or caregiver.  Consent for the procedure was obtained and is signed in the bedside chart   Anesthesia Etomidate, Fentanyl, and Rocuronium   Time Out Verified patient identification, verified procedure, site/side was marked, verified correct patient position, special equipment/implants available, medications/allergies/relevant history reviewed, required imaging and test results available.   Sterile Technique Usual hand hygeine, masks, and gloves were used   Procedure Description Patient positioned in bed supine.  Sedation given as noted above.  Patient was intubated with endotracheal tube using Glidescope.  View was Grade 1 full glottis .  Number of attempts was 1.  Colorimetric CO2 detector was consistent with tracheal placement.   Complications/Tolerance None; patient tolerated the procedure well. Chest X-ray is ordered to verify placement.   EBL none   Specimen(s) None

## 2021-09-20 NOTE — ED Notes (Signed)
IR team at bedside at this time and also alert Dr. Lorin Mercy of the patient's current condition

## 2021-09-20 NOTE — Consult Note (Addendum)
Cardiology Consultation:   Patient ID: Gregory Tanner MRN: [id number]; DOB: [birth year]  Admit date: 09/20/2021 Date of Consult: 09/20/2021  PCP:  Gregory Hipps, MD   [facility] Providers Cardiologist:  Gregory More, MD     Patient Profile:   Gregory Tanner is a 66 y.o. male with a hx of HTN, chronic diastolic CHF, chronic pain, ESRD on MWF HD, DM, and NADH cirrhosis with hepatocellular carcinoma s/p liver transplant who is being seen 09/20/2021 for the evaluation of SVT/flutter at the request of Dr. Lorin Tanner.  History of Present Illness:   Gregory Tanner is a 66 year old male with above medical history who is followed by Gregory Tanner.  Per chart review, patient has a history of cryptogenic cirrhosis that was complicated by hepatocellular carcinoma.  He underwent liver transplantation in 2013.  In 06/2019, patient was having edema in the setting of diastolic heart failure, CKD, multifactorial liver disease.  Patient underwent echocardiogram on 07/03/2019 that showed EF 40-98%, normal diastolic parameters.  Later went on to have a right heart catheterization in 12/21 that showed elevated filling pressures, RV greater than LV with very high cardiac output.  Patient was seen by advanced heart failure team in October 2022 while he was admitted to the hospital for evaluation of shortness of breath.  Echocardiogram on 11/23/2020 showed EF 65-70%, mild LVH, grade 2 diastolic dysfunction, intraventricular septal flattening in systole consistent with RV pressure overload, severely elevated pulmonary artery systolic pressure, severely dilated LA and RA.  Right heart catheterization on 12/11/2020 showed mildly elevated PCWP, primarily RV failure.  Had a TEE on 12/14/2020 that showed LVEF 60-65%, D-shaped septum suggestive of RV pressure/volume overload, mildly reduced RV systolic function, moderate enlargement of LV, moderate dilation of LA and RA, moderate tricuspid valve regurgitation.  Cardiac MRI on  12/15/2020 showed that there was no anomalous pulmonary venous return.  Patient started hemodialysis on 04/06/2021. He attends on MWF and often requests an additional session on Saturdays. He had a mechanical fall and developed a SAH in 03/2021. Patient has recently been getting paracenteses every 3 days.   Patient was seen by [facility] while admitted to the hospital in 08/2021. He had presented to the ED complaining of SOB, nausea. HR elevated to 210 BPM, he was given adenosine 6 mg followed by a second dose at 12 mg. HR improved to the 140s. Patient underwent bedside paracentesis that yielded 2200 mL fluid. Rhythm strips showed SVT vs Atrial flutter. Patient was started on PO amiodarone.   Patient presented to the ED on 8/7 complaining of tachycardia. Patient had gone to dialysis, but he was unable to receive his treatment due to tachycardia. HR was 155 at dialysis. Initial EKG in the ED suspicious for SVT vs 2:1 atrial flutter,  RBBB and LAFB.  Repeat EKG showed atrial fibrillation, RBBB.   On interview, patient is dry heaving and has a difficult time answering questions.  Patient reports that he went to dialysis this morning, however was unable to do so due to his heart rate being fast.  Patient is unaware that his heart rate is elevated.  Denies dizziness, chest pain, palpitations.  Does feel short of breath.  Patient reports having nausea/dry heaves often, however worse today.  Wife at bedside reports that she drains the fluid off his abdomen every day, was unable to this morning.  Past Medical History:  Diagnosis Date   Ankylosis of lumbar spine 02/21/2014   Benign essential hypertension 10/26/2015   Chronic diastolic  heart failure ([place]) 09/26/2016   Chronic pain associated with significant psychosocial dysfunction 06/17/2015   Degeneration of intervertebral disc of lumbar region 07/05/2013   ESRD (end stage renal disease) on dialysis ([place]) 03/27/2017   Hearing loss    Hepatocellular  carcinoma ([place]) 12/27/2010   Overview:  Embolized 6/12    History of liver transplant ([place]) 07/20/2011   Overview:  06/07/2011 for cryptogenic cirrhosis and HCC    Uncontrolled type 2 diabetes mellitus with insulin therapy 06/21/2018    Past Surgical History:  Procedure Laterality Date   AV FISTULA PLACEMENT Left 07/16/2021   Procedure: LEFT ARM RADIOCEPHALIC ARTERIOVENOUS  FISTULA CREATION;  Surgeon: [name], MD;  Location: [place];  Service: Vascular;  Laterality: Left;   [place] STUDY  12/14/2020   Procedure: BUBBLE STUDY;  Surgeon: [name], MD;  Location: [place];  Service: Cardiovascular;;   EYE SURGERY Left    HERNIA REPAIR     IR PARACENTESIS  11/30/2020   IR PARACENTESIS  05/20/2021   IR PARACENTESIS  06/01/2021   IR PARACENTESIS  06/17/2021   IR PARACENTESIS  07/13/2021   IR PARACENTESIS  07/20/2021   IR PARACENTESIS  07/27/2021   IR PARACENTESIS  07/30/2021   IR PARACENTESIS  08/02/2021   IR PARACENTESIS  08/09/2021   IR PERC PLEURAL DRAIN W/INDWELL CATH W/IMG GUIDE  08/27/2021   LIVER SURGERY     IMPLANT   LIVER TRANSPLANT     RIGHT HEART CATH N/A 12/11/2020   Procedure: RIGHT HEART CATH;  Surgeon: [name], MD;  Location: [place] [facility];  Service: Cardiovascular;  Laterality: N/A;   TEE WITHOUT CARDIOVERSION N/A 12/14/2020   Procedure: TRANSESOPHAGEAL ECHOCARDIOGRAM (TEE);  Surgeon: [name], MD;  Location: [facility] ENDOSCOPY;  Service: Cardiovascular;  Laterality: N/A;   TONSILLECTOMY         Inpatient Medications: Scheduled Meds:  allopurinol  150 mg Oral q morning   Chlorhexidine Gluconate Cloth  6 each Topical Q0600   heparin  5,000 Units Subcutaneous Q8H   insulin aspart  0-6 Units Subcutaneous TID WC   [START ON 09/22/2021] midodrine  10 mg Oral Q M,W,F-HD   [START ON 09/22/2021] midodrine  20 mg Oral Q M,W,F   mycophenolate  1,000 mg Oral BID   sodium chloride flush   3 mL Intravenous Q12H   sodium zirconium cyclosilicate  10 g Oral Once   Continuous Infusions:  amiodarone 60 mg/hr (09/20/21 1355)   Followed by   amiodarone     PRN Meds: acetaminophen **OR** acetaminophen, calcium carbonate (dosed in mg elemental calcium), camphor-menthol **AND** hydrOXYzine, docusate sodium, feeding supplement (NEPRO CARB STEADY), hydrALAZINE, ondansetron **OR** ondansetron (ZOFRAN) IV, sorbitol, zolpidem  Allergies:    Allergies  Allergen Reactions   Iodinated Contrast Media Hives and Other (See Comments)    Allergy is NOT to all contrast - but patient is unsure of which particular one his allergy is to. Contrast dye used before liver transplant cause hives, not sure which dye this was but is able to use others   Jardiance [Empagliflozin] Dermatitis    developed blisters with the medication, blisters  stopped after he discontinued taking it. (About 6 months ago /~06/2020)   Metformin And Related Nausea And Vomiting   Other Nausea And Vomiting    Patient states that he is allergic to "some antibiotic" but is not sure of  the name of it.   Tape Other (See Comments)    Tore skin   Rosiglitazone Nausea And Vomiting    GI effects and abdominal pain    Social History:   Social History   Socioeconomic History   Marital status: Married    Spouse name: Not on file   Number of children: Not on file   Years of education: Not on file   Highest education level: Not on file  Occupational History   Not on file  Tobacco Use   Smoking status: Never   Smokeless tobacco: Never  Vaping Use   Vaping Use: Never used  Substance and Sexual Activity   Alcohol use: Yes    Alcohol/week: 0.0 standard drinks of alcohol    Comment: ocassional   Drug use: Not Currently   Sexual activity: Not on file  Other Topics Concern   Not on file  Social History Narrative   Not on file   Social Determinants of Health   Financial Resource Strain: Not on file  Food Insecurity: Not  on file  Transportation Needs: Not on file  Physical Activity: Not on file  Stress: Not on file  Social Connections: Not on file  Intimate Partner Violence: Not on file    Family History:    Family History  Problem Relation Age of Onset   Cancer Mother    Diabetes Father    Heart attack Father    Schizophrenia Father    Hypertension Brother      ROS:  Please see the history of present illness.   All other ROS reviewed and negative.     Physical Exam/Data:   Vitals:   09/20/21 0945 09/20/21 1000 09/20/21 1115 09/20/21 1130  BP: 111/77 109/73 95/65 114/80  Pulse: (!) 142 (!) 141 (!) 144 (!) 144  Resp: (!) 29 16 (!) 30 (!) 26  Temp:    98.2 F (36.8 C)  TempSrc:      SpO2: 99% 99% 97% 98%  Weight:      Height:       No intake or output data in the 24 hours ending 09/20/21 1428    09/20/2021    9:05 AM 08/31/2021    1:43 PM 08/29/2021    4:10 AM  Last 3 Weights  Weight (lbs) 216 lb 0.8 oz 191 lb 182 lb 11.2 oz  Weight (kg) 98 kg 86.637 kg 82.872 kg     Body mass index is 31 kg/m.  Physical Exam per Dr. Stanford Breed   EKG:  The EKG was personally reviewed and demonstrates:  Atrial flutter, 2:1, RBBB present  Telemetry:  Telemetry was personally reviewed and demonstrates:  Atrial flutter, HR in the 90s-130s   Relevant CV Studies:  Echocardiogram 11/23/2020  1. Left ventricular ejection fraction, by estimation, is 65 to 70%. The  left ventricle has normal function. The left ventricle has no regional  wall motion abnormalities. There is mild concentric left ventricular  hypertrophy. Left ventricular diastolic  parameters are consistent with Grade II diastolic dysfunction  (pseudonormalization). There is the interventricular septum is flattened  in systole, consistent with right ventricular pressure overload and the  interventricular septum is flattened in systole   and diastole, consistent with right ventricular pressure and volume  overload.   2. Right ventricular  systolic function is normal. The right ventricular  size is mildly enlarged. There is severely elevated pulmonary artery  systolic pressure. The estimated right ventricular systolic pressure is  20.2 mmHg.  3. Left atrial size was severely dilated.   4. Right atrial size was severely dilated.   5. MV appears to open well. Mean gradients may be exaggerated by elevated  heart rate. MVA by PHT is 2.6 cm2, by planimetry is 5.76 cm2. Visually  there is no appearance of stenosis based on leaflet mobility, there may be  very mild stenosis due to MAC.  The mitral valve is normal in structure. Trivial mitral valve  regurgitation. The mean mitral valve gradient is 8.5 mmHg. Moderate to  severe mitral annular calcification.   6. Tricuspid valve regurgitation is moderate.   7. The aortic valve is tricuspid. There is mild calcification of the  aortic valve. Aortic valve regurgitation is not visualized. No aortic  stenosis is present.   8. The inferior vena cava is dilated in size with <50% respiratory  variability, suggesting right atrial pressure of 15 mmHg.   Comparison(s): No significant change from prior study.   Conclusion(s)/Recommendation(s): Severely elevated right sided pressures,  similar to echo 06/2019 (58 mmHg on prior study, 61 mmHg on current study).   Laboratory Data:  High Sensitivity Troponin:   Recent Labs  Lab 09/20/21 0912  TROPONINIHS 30*     Chemistry Recent Labs  Lab 09/20/21 0912 09/20/21 0934  NA 122* 119*  K 6.7* 6.8*  CL 88*  --   CO2 20*  --   GLUCOSE 150*  --   BUN 49*  --   CREATININE 5.28*  --   CALCIUM 8.8*  --   MG 2.2  --   GFRNONAA 11*  --   ANIONGAP 14  --     Recent Labs  Lab 09/20/21 0912  PROT 5.6*  ALBUMIN 3.4*  AST 20  ALT 24  ALKPHOS 134*  BILITOT 0.9   Lipids No results for input(s): "CHOL", "TRIG", "HDL", "LABVLDL", "LDLCALC", "CHOLHDL" in the last 168 hours.  Hematology Recent Labs  Lab 09/20/21 0912 09/20/21 0934  WBC  12.4*  --   RBC 4.19*  --   HGB 13.1 14.3  HCT 40.1 42.0  MCV 95.7  --   MCH 31.3  --   MCHC 32.7  --   RDW 16.0*  --   PLT 220  --    Thyroid No results for input(s): "TSH", "FREET4" in the last 168 hours.  BNP Recent Labs  Lab 09/20/21 0912  BNP 1,376.7*    DDimer No results for input(s): "DDIMER" in the last 168 hours.   Radiology/Studies:  DG Chest Portable 1 View  Result Date: 09/20/2021 CLINICAL DATA:  Dyspnea EXAM: PORTABLE CHEST 1 VIEW COMPARISON:  08/19/2021 FINDINGS: Right IJ approach central venous catheter remains in place. Stable cardiomegaly. Pulmonary vascular congestion. No overt edema. No focal airspace consolidation, pleural effusion, or pneumothorax. IMPRESSION: Cardiomegaly and pulmonary vascular congestion without overt edema. Electronically Signed   By: Davina Poke D.O.   On: 09/20/2021 09:48     Assessment and Plan:   Atrial Flutter with RVR  - Patient presented with heart rate elevated to 155 bpm, EKG suspicious for 2:1 atrial flutter -PTA, patient was on amiodarone 200 mg daily.  Now on IV amiodarone, s/p bolus -Per RN, after starting IV amiodarone heart rate did improve to the 90s.  However patient became nauseous and started dry heaving, heart rate elevated up to 120s -Continue IV amiodarone -Avoid AV nodal blocking medications as patient becomes very hypotensive during dialysis and requires midodrine -Patient not on [facility] with prior long discussion regarding  goals of care -Agree with palliative care consult, treatment options limited  Severe pulmonary hypertension High-output heart failure Right-sided heart failure -Most recent echocardiogram completed 11/23/2020 and showed EF 30-16%, grade 2 diastolic dysfunction, severely elevated pulmonary artery systolic pressure. Has known history of right sided heart failure at [place]  -Volume managed with hemodialysis. Hold BP medications to optimize dialysis  -Patient has been seen by [facility] cardiology in the  past.  Per wife, patient has a poor prognosis, given 6 months prognosis about 5 months ago   ESRD -Patient on hemodialysis Monday, Wednesday, Friday.  Often goes for additional sessions on Saturday -HD often complicated by hypotension, on midodrine for BP support  History of Liver Transplant 2013  Ascites  Indwelling Abdominal Cavity Drain  -Patient gets the fluid drained from his abdomen every day, has an indwelling catheter   Risk Assessment/Risk Scores:   New York Heart Association (NYHA) Functional Class NYHA Class IV  CHA2DS2-VASc Score = 5   This indicates a 7.2% annual risk of stroke. The patient's score is based upon: CHF History: 1 HTN History: 1 Diabetes History: 1 Stroke History: 0 Vascular Disease History: 1 Age Score: 1 Gender Score: 0    For questions or updates, please contact [facility] Please consult [email or web address] for contact info under    Signed, Margie Billet, PA-C  09/20/2021 2:28 PM As above, patient seen and examined.  Briefly he is a 66 year old male with past medical history of hypertension, chronic diastolic/right-sided heart failure, end-stage renal disease dialysis dependent, cirrhosis with hepatocellular carcinoma status post liver transplant, diabetes mellitus, atrial fibrillation/flutter for evaluation of recurrent atrial flutter with rapid ventricular response. Transesophageal echocardiogram October 2022 showed normal LV function, D-shaped septum, moderate right ventricular enlargement, biatrial enlargement, moderate tricuspid regurgitation, trace aortic insufficiency.  Right heart catheterization October 2022 showed pulmonary capillary wedge pressure 17 with PA pressure 66/25.  Cardiac MRI November 2022 showed no anomalous pulmonary venous return.  Patient recently discharged and was treated with amiodarone for atrial flutter.  Rate control was planned.  Recently he has had progressive increased weakness, increasing dyspnea, increasing  bilateral lower extremity edema.  He went to dialysis today and his heart rate was elevated in the 1 30-1 40 range.  He was sent to the emergency room.  He is found to be in atrial flutter and cardiology asked to evaluate. On exam he is markedly volume overloaded with ascites and 2-3+ bilateral lower extremity edema. Electrocardiogram shows probable atrial flutter, right bundle branch block, left anterior fascicular block.  Chest x-ray shows cardiomegaly and pulmonary vascular congestion. Laboratories show sodium 119, potassium 6.8, creatinine 5.25,, BNP 1376, troponin 30, hemoglobin 13.1.  1 persistent atrial flutter-patient's heart rate is elevated.  His blood pressure is borderline and he is on midodrine.  We therefore cannot add beta-blockade or calcium blocker.  I agree with resuming IV amiodarone.  Hopefully his heart rate will improve as his levels increase.  He is not on anticoagulation because of comorbidities, history of subarachnoid hemorrhage and goals of care discussion.  2 right-sided heart failure/ascites-he is markedly volume overloaded on examination.  Volume removal per dialysis.  3 chronic hypotension-continue present dose of midodrine.  4 end-stage renal disease-dialysis per nephrology.  5 hyponatremia-managed by primary care.  6 history of liver transplant  Prognosis appears to be poor.  I agree with palliative care consult.  Kirk Ruths, MD

## 2021-09-20 NOTE — H&P (Addendum)
NAME:  Gregory Tanner, MRN:  [id number], DOB:  1955-12-24, LOS: 0 ADMISSION DATE:  09/20/2021, CONSULTATION DATE:  09/20/21 REFERRING MD:  Lorin Mercy , CHIEF COMPLAINT:  sob, fatigue   History of Present Illness:  66 yo man with aflutter (recently dx on amio), ESRD on HD MWF (just had fistula surgery in 3/73), diastolic CHF, pulmonary HTN, DM, s/p liver transplant, chronic ascites   (?) with peritonial catheter indwelling, chronic hypotension, here with tachycardia and resp distress.    2-3 days fatigue, palpitations, vomiting.  Too weak to walk across room today.  Dyspnea Mild chest pain (had in past with SVT) Dark stool (taking pepto) Unable to do his normal daily drainage from abd today (has inwelling peritoneal catheter).  Also notes increased peritoneal fluid output prior to today.   Sent to [facility] ED via EMS for tachycardia from HD center.  Found to be Tachypneic, tachycardic, resp distress and b rales, jvd with volume overload.   Hyponatremic to 119, hyperk 6.8  episodes of tachycardia to 150s -160s, concurrent diaphoresis and respiratory distress when very tachy.  CXR clear lungs, cardiomegaly.  Refused Bipap.  given amio load and  infusion. Hr improved from 140s to 100.   Ceftriaxone for coverage for sbp In ed also given lokelma, calcium.   paracetesis via his indwelling catheter. 2.4 L removed at about 4pm. No improvement in symptoms at that time per radiology howerver.  At about 5pm much more diaphoretic and telling nurse he felt he was going to die.   ICU team notified of status at 640pm.  On my exam, diaphoretic, unable to speak at all, not moving air.  Unable to get pulse ox sat, intermitted measurements in 50s.   HR 160s.  BP within normal limits at the time.  Intubated for respiratory failure (poor air movement tachypneic in 72s).   Hypotension prior to intubation, started on levophed, given small bolus doses neosynephrine.   Unable to get reliable pressures, art line  and central line placed.  Amio bolus given for HR up to 200s.   Looked like still in fib/flutter.   Improved only very slightly with insulin, D50, calcium, bicarb.   Amio gtt held,s edation held for hypotension.   Considered electrical cardioversion, but patient cardioverted on own, BP improved.   In past has been recommended to  initiate hospice given poor prognosis from RV failure, Pulm HTN, Arrhythmias, ESRD, has declined.    Pertinent  Medical History  Liver transplant in 2013 2/2 NASH cirrhosis HCC HCC embolized 2012 ESRD on HD, started 2/23  - hypotension with HD, bumex only on non HD days  Hearing loss [facility] 2/23 after fall  Chronic diastolic chf  high output HF Right heart failure, severely elevated PA pressure A fib/fluter -recently diagnosed and started on amio, unclear why not on anticoag. 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  Poor prog (wife was told he had 6 month prognosis about 5 months ago).  echocardiogram on 07/03/2019 that showed EF 42-87%, normal diastolic parameters.  right heart catheterization in 12/21 that showed elevated filling pressures, RV greater than LV with very high cardiac output.  Echocardiogram on 11/23/2020 showed EF 65-70%, mild LVH, grade 2 diastolic dysfunction, intraventricular septal flattening in systole consistent with RV pressure overload, severely elevated pulmonary artery systolic pressure, severely dilated  LA and RA.    Right heart catheterization on 12/11/2020 showed mildly elevated PCWP, primarily RV failure.   PA 66/25 with PACWP 17  TEE on 12/14/2020 that showed LVEF 60-65%, D-shaped septum suggestive of RV pressure/volume overload, mildly reduced RV systolic function, moderate enlargement of LV, moderate dilation of LA and RA, moderate tricuspid valve regurgitation.    Cardiac MRI on  12/15/2020 showed that there was no anomalous pulmonary venous return.  Bumex, allopurinol, amiodarone, insulin ss, tums, midodrine '10mg'$  pre dialysis, MVI, Cellcept '1000mg'$  qam and qhs, tart cherry,  Significant Hospital Events: Including procedures, antibiotic start and stop dates in addition to other pertinent events     Interim History / Subjective:    Objective   Blood pressure (!) 79/54, pulse 70, temperature (!) 95.4 F (35.2 C), resp. rate (!) 24, height '5\' 10"'$  (1.778 m), weight 98 kg, SpO2 100 %.    Vent Mode: PRVC FiO2 (%):  [100 %] 100 % Set Rate:  [24 bmp] 24 bmp Vt Set:  [600 mL] 600 mL PEEP:  [5 cmH20] 5 cmH20 Plateau Pressure:  [20 cmH20] 20 cmH20  No intake or output data in the 24 hours ending 09/20/21 2142 Filed Weights   09/20/21 0905  Weight: 98 kg    Examination: General: intubated, sedated  HENT: ncat, kyphosis  Lungs: CTAB, easy to bag.  Cardiovascular: irreg rhythm, no mgr  Abdomen: distended Extremities: edema to thights  Neuro: sedated  GU: foley being placed.   Resolved Hospital Problem list     Assessment & Plan:  Hyperkalemia Hyponatremia Afib/flutter with RBBB LAFB  Hypotension Respiratory insufficiency, tachypnea RV failure, Pulmonary HTN Ascites Decompensated, volume overload High output hf Liver transplant DM2 ESRD SAH hx  Chronic hypotension  hyperK, ESRD: Urgent CRRT. Try to correct electrolytes asap. Seems volume control difficult to achieve even with HD and regular ascites drainage.   Afib/flutter: cont management with amio as tolerates Check TSH.   RV failure, PHTN, High op hf, shock: seems cardiac in nature.  Cont pressors as tolerates.  AVOID neosynephrine given severe PH and RV failure.  Cardiology consulting.   End stage, evidenced by portal htn, kidney failure:: palliative care consult seems reasonable,. Patient seems to have wanted aggressive caare despite prognosis.  Though fam has said if unlikely to get off  machines, would not want to continue.  Shock likely decompensation from cardiac issues, also being covered with ceftriaxone today for posssible ascites infection.  Reasonable.  Checking procal.   Resp failure: sob, tachypnea associated with tachcyardia, RV failure.  Intubated for work of breathing, airway protection.  Oxygenating and ventilating well actually.   Liver tx: cont cellcept  '[]'$ tsh, ppi, stop heparin?       Best Practice (right click and "Reselect all SmartList Selections" daily)   Diet/type: NPO DVT prophylaxis: systemic heparin not on ac at home but started here prior to known hx of not being on [facility]  GI prophylaxis: PPI Lines: Central line Foley:  N/A Code Status:  full code Last date of multidisciplinary goals of care discussion '[]'$   Labs   CBC: Recent Labs  Lab 09/20/21 0912 09/20/21 0934 09/20/21 2050  WBC 12.4*  --   --   NEUTROABS 10.4*  --   --   HGB 13.1 14.3 13.6  HCT 40.1 42.0 40.0  MCV 95.7  --   --   PLT 220  --   --     Basic Metabolic Panel: Recent Labs  Lab 09/20/21 0912  09/20/21 0934 09/20/21 1200 09/20/21 2050  NA 122* 119*  --  116*  K 6.7* 6.8*  --  6.1*  CL 88*  --   --   --   CO2 20*  --   --   --   GLUCOSE 150*  --   --   --   BUN 49*  --   --   --   CREATININE 5.28*  --   --   --   CALCIUM 8.8*  --   --   --   MG 2.2  --   --   --   PHOS  --   --  7.7*  --    GFR: Estimated Creatinine Clearance: 15.9 mL/min (A) (by C-G formula based on SCr of 5.28 mg/dL (H)). Recent Labs  Lab 09/20/21 0912  WBC 12.4*    Liver Function Tests: Recent Labs  Lab 09/20/21 0912  AST 20  ALT 24  ALKPHOS 134*  BILITOT 0.9  PROT 5.6*  ALBUMIN 3.4*   No results for input(s): "LIPASE", "AMYLASE" in the last 168 hours. No results for input(s): "AMMONIA" in the last 168 hours.  ABG    Component Value Date/Time   PHART 7.308 (L) 09/20/2021 2050   PCO2ART 31.5 (L) 09/20/2021 2050   PO2ART 457 (H) 09/20/2021 2050   HCO3 15.8 (L)  09/20/2021 2050   TCO2 17 (L) 09/20/2021 2050   ACIDBASEDEF 9.0 (H) 09/20/2021 2050   O2SAT 100 09/20/2021 2050     Coagulation Profile: No results for input(s): "INR", "PROTIME" in the last 168 hours.  Cardiac Enzymes: No results for input(s): "CKTOTAL", "CKMB", "CKMBINDEX", "TROPONINI" in the last 168 hours.  HbA1C: Hgb A1c MFr Bld  Date/Time Value Ref Range Status  12/06/2020 02:40 AM 5.2 4.8 - 5.6 % Final    Comment:    (NOTE) Pre diabetes:          5.7%-6.4%  Diabetes:              >6.4%  Glycemic control for   <7.0% adults with diabetes   07/24/2019 03:39 PM 6.0 (H) 4.8 - 5.6 % Final    Comment:    (NOTE) Pre diabetes:          5.7%-6.4% Diabetes:              >6.4% Glycemic control for   <7.0% adults with diabetes     CBG: Recent Labs  Lab 09/20/21 1017 09/20/21 1527  GLUCAP 155* 193*    Review of Systems:   Unable   Past Medical History:  He,  has a past medical history of Ankylosis of lumbar spine (02/21/2014), Benign essential hypertension (10/26/2015), Chronic diastolic heart failure ([place]) (09/26/2016), Chronic pain associated with significant psychosocial dysfunction (06/17/2015), Degeneration of intervertebral disc of lumbar region (07/05/2013), ESRD (end stage renal disease) on dialysis ([place]) (03/27/2017), Hearing loss, Hepatocellular carcinoma ([place]) (12/27/2010), History of liver transplant ([place]) (07/20/2011), and Uncontrolled type 2 diabetes mellitus with insulin therapy (06/21/2018).   Surgical History:   Past Surgical History:  Procedure Laterality Date   AV FISTULA PLACEMENT Left 07/16/2021   Procedure: LEFT ARM RADIOCEPHALIC ARTERIOVENOUS  FISTULA CREATION;  Surgeon: [name], MD;  Location: [place];  Service: Vascular;  Laterality: Left;   BACK SURGERY     BAKER CYST SURGERY     RIGHT LEG   BUBBLE STUDY  12/14/2020   Procedure: BUBBLE STUDY;  Surgeon: [name], MD;  Location: [place];  Service: Cardiovascular;;    EYE SURGERY Left    HERNIA REPAIR     IR PARACENTESIS  11/30/2020   IR PARACENTESIS  05/20/2021   IR PARACENTESIS  06/01/2021   IR PARACENTESIS  06/17/2021   IR PARACENTESIS  07/13/2021   IR PARACENTESIS  07/20/2021   IR PARACENTESIS  07/27/2021   IR PARACENTESIS  07/30/2021   IR PARACENTESIS  08/02/2021   IR PARACENTESIS  08/09/2021   IR PERC PLEURAL DRAIN W/INDWELL CATH W/IMG GUIDE  08/27/2021   LIVER SURGERY     IMPLANT   LIVER TRANSPLANT     RIGHT HEART CATH N/A 12/11/2020   Procedure: RIGHT HEART CATH;  Surgeon: [name], MD;  Location: [place] CV LAB;  Service: Cardiovascular;  Laterality: N/A;   TEE WITHOUT CARDIOVERSION N/A 12/14/2020   Procedure: TRANSESOPHAGEAL ECHOCARDIOGRAM (TEE);  Surgeon: [name], MD;  Location: [facility] ENDOSCOPY;  Service: Cardiovascular;  Laterality: N/A;   TONSILLECTOMY       Social History:   reports that he has never smoked. He has never used smokeless tobacco. He reports current alcohol use. He reports that he does not currently use drugs.   Family History:  His family history includes Cancer in his mother; Diabetes in his father; Heart attack in his father; Hypertension in his brother; Schizophrenia in his father.   Allergies Allergies  Allergen Reactions   Iodinated Contrast Media Hives and Other (See Comments)    Allergy is NOT to all contrast - but patient is unsure of which particular one his allergy is to. Contrast dye used before liver transplant cause hives, not sure which dye this was but is able to use others   Jardiance [Empagliflozin] Dermatitis    developed blisters with the medication, blisters  stopped after he discontinued taking it. (About 6 months ago /~06/2020)   Metformin And Related Nausea And Vomiting   Other Nausea And Vomiting    Patient states that he is allergic to "some antibiotic" but is not sure of the name of it.   Tape Other (See Comments)    Tore skin   Rosiglitazone Nausea And Vomiting    GI  effects and abdominal pain     Home Medications  Prior to Admission medications   Medication Sig Start Date End Date Taking? Authorizing Provider  acetaminophen (TYLENOL) 500 MG tablet Take 1,000 mg by mouth See admin instructions. Take 2 tablets (1000 mg) by mouth in the morning on dialysis days - Monday, Wednesday, Friday and sometimes Saturday   Yes [provider]  allopurinol (ZYLOPRIM) 300 MG tablet Take 150 mg by mouth every morning. 05/07/18  Yes [provider]  amiodarone (PACERONE) 200 MG tablet Take 1 tablet twice daily until 09/08/21, then on 09/09/21 start taking 1 tablet daily. Patient taking differently: Take 200 mg by mouth every morning. 08/29/21  Yes [name], Jimmy Picket, MD  bumetanide (BUMEX) 2 MG tablet Take 4 mg by mouth See admin instructions. Take 4 mg twice daily on non dialysis days: Tuesday, Thursday, and Sunday. 12/22/20 12/22/21 Yes [provider]  calcium carbonate (TUMS EX) 750 MG chewable tablet Chew 2 tablets by mouth daily as needed for heartburn (indigestion/acid reflux).   Yes [provider]  insulin lispro (HUMALOG) 100 UNIT/ML KwikPen Inject 2-5 Units into the skin See admin instructions. Inject 2-5 units subcutaneously three time daily before meals per sliding scale: CBG 150-200= 2 units, 201-250=3 units, 251-300=4 units, 301-350=5 units 09/19/20  Yes [provider]  Lidocaine HCl (ASPERCREME LIDOCAINE) 4 % CREA Apply 1 Application topically once as needed (prior to port access).   Yes [provider]  midodrine (PROAMATINE) 10 MG tablet Take 10-20 mg by mouth See admin instructions. Take 2 tablets (20 mg) by mouth on Monday, Wednesday and Friday (and sometimes Saturday) before dialysis. Take 1 tablet (10 mg) during dialysis. 04/21/21  Yes [provider]  Multiple Vitamin (MULTIVITAMIN WITH MINERALS) TABS tablet Take 1 tablet by mouth every morning.   Yes [provider]  mycophenolate  (CELLCEPT) 250 MG capsule Take 4 capsules (1,000 mg total) by mouth in the morning and at bedtime. 12/03/20  Yes [name], MD  OVER THE COUNTER MEDICATION Apply 1 Application topically every other day. Over the counter cream to dry up blisters   Yes [provider]  TART CHERRY PO Take 1 capsule by mouth in the morning and at bedtime.   Yes [provider]     Critical care time: 96mn

## 2021-09-20 NOTE — Progress Notes (Signed)
[facility] Progress Note Patient Name: Gregory Tanner DOB: 05-04-1955 MRN: [id number]   Date of Service  09/20/2021  HPI/Events of Note  Patient with Acute respiratory failure, volume overload, severe hyperkalemia, shock. Has arrived on PRVC VT 600, RR 24, peep 5, fio2 40. O2 sat is 98. BP 140/70 on 30 mic/min levophed. HR is 56. Versed drip was turned off and currently he is lethargic. Heparin IV and levophed IV are infusing. Bedside placing notes. Has a right chest HD cath, right groin CVC and right groin art line.   eICU Interventions  Is planned for urgent CRRT.  Ground team doing the admission. Case d/w bedside Rns. Overall very ill.      Intervention Category Major Interventions: Electrolyte abnormality - evaluation and management;Acute renal failure - evaluation and management;Respiratory failure - evaluation and management;Shock - evaluation and management Evaluation Type: New Patient Evaluation  Margaretmary Lombard 09/20/2021, 9:48 PM

## 2021-09-20 NOTE — Progress Notes (Addendum)
    Visited patient after paracentesis. Patient reports that his breathing has improved. Denies dizziness, chest pain, palpitations. HR is still elevated to the 130s-140s. Treatment options are limited by patient's BP and renal function.  Of note, K 6.7 on presentation. Per nephrology's note, patient is set to undergo HD today. This will help manage his hyperkalemia, and will hopefully help relieve his SOB (especially as CXR showed pulmonary vascular congestion and patient is very fluid overloaded on exam). Could also hopefully improve HR.    Margie Billet, PA-C 09/20/2021 5:26 PM

## 2021-09-20 NOTE — ED Notes (Signed)
Patient was given a cup of water. 

## 2021-09-20 NOTE — ED Notes (Signed)
Provider Dr Lorin Mercy notified of patient's concerns at this time via secure chat

## 2021-09-20 NOTE — ED Notes (Signed)
CCM at bedside, RT to apply BIPAP

## 2021-09-20 NOTE — ED Notes (Signed)
Dr Lorin Mercy at bedside. This RN notifies cardiology team Mr Jonnie Finner about the patient's concerns

## 2021-09-20 NOTE — ED Notes (Signed)
Per provider at bedside, patient requires intubation at this time. RT and provider at bedside along with multiple ER nurses.

## 2021-09-20 NOTE — Progress Notes (Signed)
Brief nephrology note  Patient has ongoing instability and tachycardia with severe electrolyte derangements including hyperkalemia and hyponatremia. Will start crrt  Continue with medical management of hyperkalemia with calcium gluconate and shifting agents as improvement in hyperkalemia with crrt may take some time.  This is also complicated by hyponatremia which is limiting aggressive dialysate use. Na was 122 on arr ival goal sodium by the morning can be 128. If sodium correcting slowly will increase DFR to help with hyperkalemia. Frequent lab checks are ordered

## 2021-09-20 NOTE — ED Notes (Signed)
ZOLL pads on patient at this time patient is pale and diaphoretic paitent reports that he feels like he is going to die and wife states that she is concerned for the patient

## 2021-09-20 NOTE — ED Notes (Signed)
ICU provider notified of new EKGs taken due to rhythm change

## 2021-09-20 NOTE — Progress Notes (Signed)
PROCEDURE SUMMARY:  1200:  Patient assessed for leaking PleurX.  His in a fib with RVR on amiodarone drip. There is a saturated dressing which may be explained as (1) he is full and distended.  It appears he has been draining 1 bottle (approx 1.2 L/day) at home as this is all he has the supplies to be able to do.  This provides little relief for a few hours, then he is uncomfortable and short of breath again.   As Dr. Earleen Newport suggested, he likely has increased fluid burden that is leaking. (2) He has generalized lower abdominal and lower extremity subQ edema.  He may have some weeping edema from the PleurX tract.   He says he has ordered supplies through his PCP and that they have increased his script.   Discussed with Dr. Lorin Mercy, proceed with inpatient drainage today if possible.   1600:  PA to bedside at the request of the ED RN who states patient is very uncomfortable and unstable.  Requesting paracentesis this afternoon per Dr. Lorin Mercy.   Patient found in [place] with HR 115-160.  He is diaphoretic.  RN, Dr. Lorin Mercy, and Cardiology aware.  Patient engaged in ongoing discussions regarding EOL care. At the time patient indicates he wishes to proceed with paracentesis despite instability due to shortness of breath and discomfort.  Access kit obtained and 2.4 liters removed.   Patient in unchanged condition at end of procedure.  No complications.  Dressing replaced.   Wife present throughout.    Docia Barrier PA-C 09/20/2021 5:02 PM

## 2021-09-20 NOTE — ED Notes (Signed)
Patient reports that post paracentesis that he feels much better. Diaphoresis has resolved and patient able to speak in sentences. Patient still reports nausea

## 2021-09-20 NOTE — Progress Notes (Signed)
    Called By CCM, pt has respiratory failure and preparing to ventilate.  They are concerned with rhythm.  I asked Dr. Sallyanne Kuster to review as well and we believe the QRS is so wide due to hyperkalemia.  He has not yet been dialyzed   Pt could be cardioverted but most likely will go back in same rhythm.  On most recent EKG it does appear like atrial flutter.   Will give another 150 mg   of IV amiodarone. But intubation and dialysis will be most helpful.  Pt will be admitted to ICU with CCM.    MD on call for cards will be notified as well.  Cecilie Kicks, FNP-C [facility]  ZOX:[phone] or after 5pm and on weekends call [phone] 09/20/2021.now

## 2021-09-20 NOTE — Procedures (Signed)
Central Venous Catheter Insertion Procedure Note  Gregory Tanner  [id number]  06-Dec-1955  Date:09/20/21  Time:10:49 PM   Provider Performing:Saulo Anthis [name]   Procedure: Insertion of Non-tunneled Central Venous Catheter(36556) with US guidance (54650)   Indication(s) Medication administration  Consent Unable to obtain consent due to emergent nature of procedure.  Anesthesia none  Timeout Verified patient identification, verified procedure, site/side was marked, verified correct patient position, special equipment/implants available, medications/allergies/relevant history reviewed, required imaging and test results available.  Sterile Technique Maximal sterile technique including full sterile barrier drape, hand hygiene, sterile gown, sterile gloves, mask, hair covering, sterile ultrasound probe cover (if used).  Procedure Description Area of catheter insertion was cleaned with chlorhexidine and draped in sterile fashion.  With real-time ultrasound guidance a central venous catheter was placed into the left femoral vein. Nonpulsatile blood flow and easy flushing noted in all ports.  The catheter was sutured in place and sterile dressing applied.  Complications/Tolerance None; patient tolerated the procedure well. Chest X-ray is ordered to verify placement for internal jugular or subclavian cannulation.   Chest x-ray is not ordered for femoral cannulation.  EBL Minimal  Specimen(s) None

## 2021-09-20 NOTE — ED Provider Notes (Signed)
[facility] EMERGENCY DEPARTMENT Provider Note   CSN: [id number] Arrival date & time: 09/20/21  6384     History  No chief complaint on file.   Gregory Tanner is a 66 y.o. male with past medical history of recently diagnosed atrial flutter on amiodarone, ESRD on dialysis MWF, diastolic CHF, diabetes, liver transplant, pulm hypertension who presents from dialysis where he was noted to be tachycardic into the 160s.  EMS was summoned and the patient was transported to [facility] for further evaluation.  Upon arrival, the patient states that he has had 2 to 3 days of worsening generalized fatigue, palpitations and recent black tarry stools.  He notes that he had taken Pepto-Bismol several days prior and has since had black tarry stools.  He has had no associated fevers or chills.  Has been compliant with Monday Wednesday Friday dialysis.  Of note, patient did not receive dialysis today and was sent to the emergency department given his tachycardia as above.  Patient is complaining of mild chest pain which he states that he experienced the last time he had presented for supraventricular tachycardia. Pt has had some straw colored leakage from his peritoneal catheter.  HPI     Home Medications Prior to Admission medications   Medication Sig Start Date End Date Taking? Authorizing Provider  acetaminophen (TYLENOL) 325 MG tablet Take 650 mg by mouth every 6 (six) hours as needed for mild pain.    [provider]  allopurinol (ZYLOPRIM) 300 MG tablet Take 150 mg by mouth daily. 05/07/18   [provider]  amiodarone (PACERONE) 200 MG tablet Take 1 tablet twice daily until 09/08/21, then on 09/09/21 start taking 1 tablet daily. 08/29/21   [name], Jimmy Picket, MD  bumetanide (BUMEX) 2 MG tablet Take 4 mg by mouth See admin instructions. Take 4 mg twice daily on non dialysis days. Tuesday, Thursday, and Sunday. 12/22/20 12/22/21  [provider]  calcium  carbonate (TUMS - DOSED IN MG ELEMENTAL CALCIUM) 500 MG chewable tablet Chew 1 tablet by mouth daily as needed for indigestion or heartburn.    [provider]  insulin lispro (HUMALOG) 100 UNIT/ML KwikPen Inject 2-24 Units into the skin See admin instructions. Sliding scale 150-200= 2 units 201-250=3 units 251-300=4 units 301-350=5 units 8 units for small meals 24 units for High carbohydrates 09/19/20   [provider]  midodrine (PROAMATINE) 10 MG tablet Take 10 mg by mouth See admin instructions. Take '10mg'$  before dialysis on Monday, Wednesday, Friday, and  some Saturday depend on food retention 04/21/21   [provider]  Multiple Vitamin (MULTIVITAMIN) tablet Take 1 tablet by mouth daily.    [provider]  mycophenolate (CELLCEPT) 250 MG capsule Take 4 capsules (1,000 mg total) by mouth in the morning and at bedtime. 12/03/20   [name], MD  TART CHERRY PO Take 1 tablet by mouth in the morning and at bedtime.    [provider]      Allergies    Iodinated contrast media, Jardiance [empagliflozin], Latex, Metformin and related, Other, and Rosiglitazone    Review of Systems   Review of Systems  Constitutional:  Positive for fatigue. Negative for chills and fever.  HENT:  Negative for ear pain and sore throat.   Eyes:  Negative for pain and visual disturbance.  Respiratory:  Negative for cough and shortness of breath.   Cardiovascular:  Positive for chest pain and palpitations.  Gastrointestinal:  Negative for abdominal  pain and vomiting.  Genitourinary:  Negative for dysuria and hematuria.  Musculoskeletal:  Negative for arthralgias and back pain.  Skin:  Negative for color change and rash.  Neurological:  Negative for seizures and syncope.  All other systems reviewed and are negative.   Physical Exam Updated Vital Signs There were no vitals taken for this visit. Physical Exam Vitals and nursing note reviewed.   Constitutional:      General: He is not in acute distress.    Appearance: He is well-developed. He is ill-appearing.     Comments: Upon entering the exam room, the patient is sitting upright awake and alert.  He appears ill.  He is tachypneic.  HENT:     Head: Normocephalic and atraumatic.  Eyes:     Conjunctiva/sclera: Conjunctivae normal.  Cardiovascular:     Rate and Rhythm: Normal rate and regular rhythm.     Heart sounds: No murmur heard.    Comments: Tachycardia to a rate of 142 persistently on the monitor.  No significant variability.  Tachycardia present on exam without murmur rub or gallop.  Patient has symmetric radial pulses Pulmonary:     Effort: Pulmonary effort is normal. No respiratory distress.     Breath sounds: Normal breath sounds.     Comments: Bibasilar Rales.  Patient is tachypneic to the 30s.  He is speaking in 2-3 word sentences.  He is in mild to moderate respiratory distress.  Saturating well on room air in the mid to high 90s Abdominal:     Palpations: Abdomen is soft.     Tenderness: There is no abdominal tenderness.     Comments: Patient abdomen is distended with positive fluid wave.  No tenderness.  Peritoneal catheter appreciated without any surrounding erythema or purulence.  Some clear peritoneal fluid appreciated on bandage with no active leaking identified.  Musculoskeletal:        General: No swelling.     Cervical back: Neck supple.     Comments: Bilateral lower extremities are somewhat cool and have a purplish hue which patient reports is baseline.  Capillary refill is somewhat delayed but is intact approximately 2 to 3 seconds.  Symmetric 2-3+ pitting edema of the bilateral lower extremities.  Skin:    General: Skin is warm and dry.     Capillary Refill: Capillary refill takes less than 2 seconds.  Neurological:     Mental Status: He is alert.     Comments: Patient is fully alert and oriented moving all extremity spontaneously and is grossly  neurologically intact.  Psychiatric:        Mood and Affect: Mood normal.     ED Results / Procedures / Treatments   Labs (all labs ordered are listed, but only abnormal results are displayed) Labs Reviewed - No data to display  EKG None  Radiology No results found.  Procedures Procedures    Medications Ordered in ED Medications - No data to display  ED Course/ Medical Decision Making/ A&P                           Medical Decision Making Amount and/or Complexity of Data Reviewed Labs: ordered. Radiology: ordered.  Risk OTC drugs. Prescription drug management. Decision regarding hospitalization.   The patient presents to the emergency department tachycardic to the 140s, otherwise hemodynamically stable, tachypneic and had some degree of respiratory distress with bibasilar rales, JVD and evidence of volume overload in the setting of  missed dialysis this morning.  Differential diagnosis includes recurrent atrial flutter in the setting of volume overloaded state versus underlying sepsis versus less likely ACS given absence of frank ST segment changes on EKG.  I have personally reviewed interpret the patient's EKG which was remarkable for SVT likely atrial flutter as some flutter waves were appreciated.  Per chart review, patient has had a recent admission where he was found to have atrial flutter in a very similar presentation to prior.  He is currently taking oral amiodarone, which he states he has been compliant with.  Given the patient is on immunosuppressive regimen for liver transplant, he is a dialysis patient and has a peritoneal catheter in place he is extremely high risk for bacterial translocation.  Will empirically cover with Rocephin and will order cultures of blood and peritoneal fluid.  Given the patient is very similar presentation recently with recent diagnosis of flutter in response to amiodarone as above, will load with intravenous amiodarone 150 mg followed  by infusion.  Patient offered BiPAP for his tachypnea and mild to moderate respiratory distress although refused.  Patient has capacity.   Serial reassessment after amiodarone bolus reveals patient's heart rate has improved to approximately 100 he now appears to be in atrial fibrillation.  Patient states that he feels better  Patient's i-STAT reveals that he is hyponatremic to 119 and hyperkalemic to 6.8.  Patient was given 5 units of insulin as well as dextrose, Lokelma and calcium intravenously.  Neurology was consulted for emergent dialysis.  Patient was admitted to hospitalist medicine for dialysis and tachydysrhythmia requiring amiodarone infusion        Final Clinical Impression(s) / ED Diagnoses Final diagnoses:  Tachycardia  Hyperkalemia  Atrial flutter, unspecified type [facility])    Rx / DC Orders ED Discharge Orders     None         Levie Heritage, MD 09/20/21 [place], [place], DO 09/20/21 1507

## 2021-09-20 NOTE — ED Notes (Signed)
Verbal report given to Elmyra Ricks RN who reports that it is ok to take patient up

## 2021-09-20 NOTE — Progress Notes (Signed)
ANTICOAGULATION CONSULT NOTE - Initial Consult  Pharmacy Consult for Heparin Indication: atrial fibrillation  Allergies  Allergen Reactions   Iodinated Contrast Media Hives and Other (See Comments)    Allergy is NOT to all contrast - but patient is unsure of which particular one his allergy is to. Contrast dye used before liver transplant cause hives, not sure which dye this was but is able to use others   Jardiance [Empagliflozin] Dermatitis    developed blisters with the medication, blisters  stopped after he discontinued taking it. (About 6 months ago /~06/2020)   Metformin And Related Nausea And Vomiting   Other Nausea And Vomiting    Patient states that he is allergic to "some antibiotic" but is not sure of the name of it.   Tape Other (See Comments)    Tore skin   Rosiglitazone Nausea And Vomiting    GI effects and abdominal pain    Patient Measurements: Height: '5\' 10"'$  (177.8 cm) Weight: 98 kg (216 lb 0.8 oz) IBW/kg (Calculated) : 73 Heparin Dosing Weight: 93.3 kg  Vital Signs: Temp: 97.8 F (36.6 C) (08/07 1535) Temp Source: Oral (08/07 1535) BP: 79/54 (08/07 2051) Pulse Rate: 181 (08/07 2051)  Labs: Recent Labs    09/20/21 0912 09/20/21 0934 09/20/21 2050  HGB 13.1 14.3 13.6  HCT 40.1 42.0 40.0  PLT 220  --   --   CREATININE 5.28*  --   --   TROPONINIHS 30*  --   --     Estimated Creatinine Clearance: 15.9 mL/min (A) (by C-G formula based on SCr of 5.28 mg/dL (H)).   Medical History: Past Medical History:  Diagnosis Date   Ankylosis of lumbar spine 02/21/2014   Benign essential hypertension 10/26/2015   Chronic diastolic heart failure ([place]) 09/26/2016   Chronic pain associated with significant psychosocial dysfunction 06/17/2015   Degeneration of intervertebral disc of lumbar region 07/05/2013   ESRD (end stage renal disease) on dialysis ([place]) 03/27/2017   Hearing loss    Hepatocellular carcinoma ([place]) 12/27/2010   Overview:  Embolized 6/12     History of liver transplant ([place]) 07/20/2011   Overview:  06/07/2011 for cryptogenic cirrhosis and HCC    Uncontrolled type 2 diabetes mellitus with insulin therapy 06/21/2018    Medications:  (Not in a hospital admission)  Scheduled:   allopurinol  150 mg Oral q morning   Chlorhexidine Gluconate Cloth  6 each Topical Q0600   fentaNYL (SUBLIMAZE) injection  50 mcg Intravenous Once   insulin aspart  0-6 Units Subcutaneous TID WC   midazolam  2 mg Intravenous Once   [START ON 09/22/2021] midodrine  10 mg Oral Q M,W,F-HD   [START ON 09/22/2021] midodrine  20 mg Oral Q M,W,F   mycophenolate  1,000 mg Oral BID   sodium chloride flush  3 mL Intravenous Q12H   Infusions:   sodium chloride     sodium chloride     Followed by   sodium chloride     Followed by   sodium chloride     amiodarone Stopped (09/20/21 2000)   fentaNYL infusion INTRAVENOUS     heparin     midazolam 2 mg/hr (09/20/21 2049)   norepinephrine (LEVOPHED) Adult infusion 20 mcg/min (09/20/21 2010)   promethazine (PHENERGAN) injection (IM or IVPB)     PRN: acetaminophen **OR** acetaminophen, calcium carbonate (dosed in mg elemental calcium), camphor-menthol **AND** hydrOXYzine, docusate sodium, docusate sodium, feeding supplement (NEPRO CARB STEADY), fentaNYL, fentaNYL, hydrALAZINE, LORazepam, midazolam, ondansetron **OR**  ondansetron (ZOFRAN) IV, phenylephrine, polyethylene glycol, promethazine (PHENERGAN) injection (IM or IVPB), sodium bicarbonate, sorbitol, zolpidem  Assessment: 70 yom with a history of HTN, chronic diastolic CHF, chronic pain, ESRD on MWF HD, DM, and NADH cirrhosis with hepatocellular carcinoma s/p liver transplant . Patient is presenting with SVT/Flutter. Heparin per pharmacy consult placed for atrial fibrillation.  Patient is not on anticoagulation prior to arrival.  Hgb 14.3; plt 220  Goal of Therapy:  Heparin level 0.3-0.7 units/ml Monitor platelets by anticoagulation protocol: Yes   Plan:  No  initial heparin bolus Start heparin infusion at 1300 units/hr Check anti-Xa level in 8 hours and daily while on heparin Continue to monitor H&H and platelets  Lorelei Pont, PharmD, BCPS 09/20/2021 8:59 PM ED Clinical Pharmacist -  [phone]

## 2021-09-20 NOTE — ED Notes (Signed)
This RN goes off the floor to take care of another high acuity patient, ICU provider come to bedside and decide to intubate the patient. Megan RN and Amy RN at bedside along with RT and pharmacy to intubate patient. Family also at bedside and aware of the plan of care

## 2021-09-20 NOTE — ED Notes (Signed)
This RN suggest to transporter that this RN needs to speak with IR team before sending the patient over due to patient care concerns. This RN calls IR at this time to make sure that this RN can bring him over under the conditions. IR team reports that they will call this RN back. Dr Lorin Mercy aware

## 2021-09-20 NOTE — H&P (Signed)
History and Physical    Patient: Gregory Tanner DOB: [birth year] DOA: 09/20/2021 DOS: the patient was seen and examined on 09/20/2021 PCP: Ronita Hipps, MD  Patient coming from: Home - lives with wife; NOK: Wife, [phone]   Chief Complaint: SOB  HPI: Gregory Tanner is a 67 y.o. male with medical history significant of HTN; chronic diastolic CHF; chronic pain; ESRD on MWF HD; DM; and NASH cirrhosis with hepatocellular CA s/p liver transplant presenting with rapid heart rate.  He was last admitted from 7/6-16 for this issue (SVT) and transitioned to amiodarone.   He went to HD today and was tachycardic.  He reports that he started feeling weak and having some retching on Saturday.  He thought he might have gastroenteritis.  His weakness continued and he was too weak to walk across the room today.   He went to HD today and was sent to the ER due to persistent tachycardia.  He is aware that he has a very poor prognosis but continues to resist comfort/hospice and remains a full code.  He thinks that his issues today are related to need for further paracentesis, although the recent PleurX catheter is working well and his wife is draining him as recommended.  No fevers.     ER Course:  HR 160s despite Amio.  Flutter on EKG.  Loaded with IV Amio with improvement.  Missed HD today, K+ 6.8, Na++ 119.  Given [place], will get HD today.       Review of Systems: As mentioned in the history of present illness. All other systems reviewed and are negative. Past Medical History:  Diagnosis Date   Ankylosis of lumbar spine 02/21/2014   Benign essential hypertension 10/26/2015   Chronic diastolic heart failure ([place]) 09/26/2016   Chronic pain associated with significant psychosocial dysfunction 06/17/2015   Degeneration of intervertebral disc of lumbar region 07/05/2013   ESRD (end stage renal disease) on dialysis ([place]) 03/27/2017   Hearing loss    Hepatocellular carcinoma ([place])  12/27/2010   Overview:  Embolized 6/12    History of liver transplant ([place]) 07/20/2011   Overview:  06/07/2011 for cryptogenic cirrhosis and HCC    Uncontrolled type 2 diabetes mellitus with insulin therapy 06/21/2018   Past Surgical History:  Procedure Laterality Date   AV FISTULA PLACEMENT Left 07/16/2021   Procedure: LEFT ARM RADIOCEPHALIC ARTERIOVENOUS  FISTULA CREATION;  Surgeon: [name], MD;  Location: [facility];  Service: Vascular;  Laterality: Left;   [place] STUDY  12/14/2020   Procedure: BUBBLE STUDY;  Surgeon: [name], MD;  Location: [facility];  Service: Cardiovascular;;   EYE SURGERY Left    HERNIA REPAIR     IR PARACENTESIS  11/30/2020   IR PARACENTESIS  05/20/2021   IR PARACENTESIS  06/01/2021   IR PARACENTESIS  06/17/2021   IR PARACENTESIS  07/13/2021   IR PARACENTESIS  07/20/2021   IR PARACENTESIS  07/27/2021   IR PARACENTESIS  07/30/2021   IR PARACENTESIS  08/02/2021   IR PARACENTESIS  08/09/2021   IR PERC PLEURAL DRAIN W/INDWELL CATH W/IMG GUIDE  08/27/2021   LIVER SURGERY     IMPLANT   LIVER TRANSPLANT     RIGHT HEART CATH N/A 12/11/2020   Procedure: RIGHT HEART CATH;  Surgeon: [name], MD;  Location: [facility] CV LAB;  Service: Cardiovascular;  Laterality: N/A;  TEE WITHOUT CARDIOVERSION N/A 12/14/2020   Procedure: TRANSESOPHAGEAL ECHOCARDIOGRAM (TEE);  Surgeon: [name], MD;  Location: [facility] ENDOSCOPY;  Service: Cardiovascular;  Laterality: N/A;   TONSILLECTOMY     Social History:  reports that he has never smoked. He has never used smokeless tobacco. He reports current alcohol use. He reports that he does not currently use drugs.  Allergies  Allergen Reactions   Iodinated Contrast Media Hives and Other (See Comments)    Allergy is NOT to all contrast - but patient is unsure of which particular one his allergy is to. Contrast dye used before liver transplant cause  hives, not sure which dye this was but is able to use others   Jardiance [Empagliflozin] Dermatitis    developed blisters with the medication, blisters  stopped after he discontinued taking it. (About 6 months ago /~06/2020)   Metformin And Related Nausea And Vomiting   Other Nausea And Vomiting    Patient states that he is allergic to "some antibiotic" but is not sure of the name of it.   Tape Other (See Comments)    Tore skin   Rosiglitazone Nausea And Vomiting    GI effects and abdominal pain    Family History  Problem Relation Age of Onset   Cancer Mother    Diabetes Father    Heart attack Father    Schizophrenia Father    Hypertension Brother     Prior to Admission medications   Medication Sig Start Date End Date Taking? Authorizing Provider  acetaminophen (TYLENOL) 325 MG tablet Take 650 mg by mouth every 6 (six) hours as needed for mild pain.    [provider]  allopurinol (ZYLOPRIM) 300 MG tablet Take 150 mg by mouth daily. 05/07/18   [provider]  amiodarone (PACERONE) 200 MG tablet Take 1 tablet twice daily until 09/08/21, then on 09/09/21 start taking 1 tablet daily. 08/29/21   [name], Jimmy Picket, MD  bumetanide (BUMEX) 2 MG tablet Take 4 mg by mouth See admin instructions. Take 4 mg twice daily on non dialysis days. Tuesday, Thursday, and Sunday. 12/22/20 12/22/21  [provider]  calcium carbonate (TUMS - DOSED IN MG ELEMENTAL CALCIUM) 500 MG chewable tablet Chew 1 tablet by mouth daily as needed for indigestion or heartburn.    [provider]  insulin lispro (HUMALOG) 100 UNIT/ML KwikPen Inject 2-24 Units into the skin See admin instructions. Sliding scale 150-200= 2 units 201-250=3 units 251-300=4 units 301-350=5 units 8 units for small meals 24 units for High carbohydrates 09/19/20   [provider]  midodrine (PROAMATINE) 10 MG tablet Take 10 mg by mouth See admin instructions. Take '10mg'$  before dialysis on Monday,  Wednesday, Friday, and  some Saturday depend on food retention 04/21/21   [provider]  Multiple Vitamin (MULTIVITAMIN) tablet Take 1 tablet by mouth daily.    [provider]  mycophenolate (CELLCEPT) 250 MG capsule Take 4 capsules (1,000 mg total) by mouth in the morning and at bedtime. 12/03/20   [name], MD  TART CHERRY PO Take 1 tablet by mouth in the morning and at bedtime.    [provider]    Physical Exam: Vitals:   09/20/21 1645 09/20/21 1700 09/20/21 1715 09/20/21 1730  BP: 118/69 116/68 106/69 102/66  Pulse: (!) 128 (!) 131 (!) 135 (!) 133  Resp: (!) 28 (!) 28 19 (!) 25  Temp:      TempSrc:      SpO2: 99%  100% 98% 95%  Weight:      Height:       General:  Appears acutely and chronically ill Eyes:  PERRL, EOMI, normal lids, iris ENT:  grossly normal hearing, lips & tongue, mmm Neck:  no LAD, masses or thyromegaly Cardiovascular:  Regularly irregular (irregular with respirations consistently) with tachycardia, no m/r/g. 1-2+ LE edema.  Respiratory:   CTA bilaterally with no wheezes/rales/rhonchi.  Mildly to moderately increased respiratory effort with normal O2 sats whether he is wearing [place] O2 or not. Abdomen:  soft, NT, ND; mild to moderate ascites but not taut; PleurX in R mid abdomen Skin:  no rash or induration seen on limited exam Musculoskeletal:  grossly normal tone BUE/BLE, good ROM, no bony abnormality Psychiatric:  blunted mood and affect, speech fluent and appropriate, AOx3 Neurologic:  CN 2-12 grossly intact, moves all extremities in coordinated fashion   Radiological Exams on Admission: Independently reviewed - see discussion in A/P where applicable  DG Chest Portable 1 View  Result Date: 09/20/2021 CLINICAL DATA:  Dyspnea EXAM: PORTABLE CHEST 1 VIEW COMPARISON:  08/19/2021 FINDINGS: Right IJ approach central venous catheter remains in place. Stable cardiomegaly. Pulmonary vascular congestion. No overt edema. No focal  airspace consolidation, pleural effusion, or pneumothorax. IMPRESSION: Cardiomegaly and pulmonary vascular congestion without overt edema. Electronically Signed   By: Davina Poke D.O.   On: 09/20/2021 09:48    EKG: Independently reviewed.   0906 - Aflutter with rate 141; RBBB and LAFB with nonspecific ST changes 1039 - Afib with rate 107; RBBB and LAFB with worsened ST depression diffusely 1339 - Aflutter with rate 136; RBBB and LAFB with diffuse ST depression 1608 - SVT vs. flutter with rate 122; RBBB and LAFB  1632 - SVT vs. Flutter with rate 125; RBBB and LAFB with ST changes concerning for ischemia   Labs on Admission: I have personally reviewed the available labs and imaging studies at the time of the admission.  Pertinent labs:    Na++ 122 Glucose 150 BUN 49/Creatinine 5.28/GFR 11 BNP 1376.7 HS troponin 30 VBG: 7.3/45.5/22.4 WBC 12.4 Heme negative Blood culture pending    Assessment and Plan: Principal Problem:   Atrial fibrillation with RVR (HCC) Active Problems:   SVT (supraventricular tachycardia) (HCC)   ESRD on dialysis ([place])   Acute on chronic diastolic CHF (congestive heart failure) (HCC)   Type 2 diabetes mellitus ([place])   History of liver transplant ([place])   Goals of care, counseling/discussion   Refractory ascites    Assessment and Plan: No notes have been filed under this hospital service. Service: Hospitalist   SVT/Aflutter -Patient with known h/o CHF and baseline periodic tachycardia presenting with HR up to 150 -He appears to need rate-controlling medication, but he struggles with hypotension and so this may be problematic -He was on Amiodarone at home and has been reloaded in the ER with Amiodarone load and infusion -Will admit to progressive care unit -Cardiology consult -He is not a candidate for AV nodal agents -He is also not a candidate for cardioversion -Unfortunately, his options are quite limited -Cardiology is aware of the severity  of his condition, which may be terminal -This has been discussed with the patient, who reports that paracentesis will make this better and that this is what he needs to improve -He has been counseled on multiple occasions about his very poor prognosis and the high likelihood for demise -He continues to decline palliative care/hospice   Volume overload in an ESRD on HD  patient -Patient has multisystem organ dysfunction with cardiorenal syndrome plus recurrent ascites - he is massively volume overloaded and yet despite 4x/week HD he continues to be too hypotensive to get effective diuresis at HD -Certainly, he needs fluid restriction and very low salt intake on an ongoing basis  -Nephrology consulted for assistance in managing this challenging situation, planning to do HD -He has a multitude of electrolyte abnormalities and these may improve with HD  H/o liver transplant with persistent ascites -Continue Cellcept -He recently had a PleurX catheter placed -The challenge is that home health agencies cannot provide an adequate equipment supply for this type of situation; hospice would be able to provide enough tubing and equipment to improve his QOL but he doesn't want hospice since he would have to give up HD -Will request paracentesis in the ER via IR -If this is unsuccessful in improving his symptoms, transition to comfort is reasonable if patient is willing to consider   Acute on chronic diastolic CHF -Prior echos with preserved EF but grade 2 diastolic dysfunction -Has ascites and mild anasarca -Volume control with HD -Hold Bumex for now   Recurrent hypotension -He is now taking midodrine pre-HD and again during HD   DM -Prior A1c was 5.2, indicating good control -He appears to no longer be taking medications other than SSI maybe 2x/week -Cover with very sensitive-scale SSI    HLD -He is no longer taking medications for this issue   Goals of care -Per [facility] note on 3/28, "he has a  very complex interplay of multiorgan system dysfunction" ... with a "progressive pathology that will inevitably lead to worsening cardiac and also renal functioning" and "medical therapy does not reverse or delay the natural history of this disorder" -He acknowledges that his prognosis is poor, "less than a year" -However, he remains full code and is not hospice-eligible/refuses since he is still on HD -Ongoing GOC conversations are important     Advance Care Planning:   Code Status: Full Code   Consults: IR; Cardiology  DVT Prophylaxis: Heparin  Family Communication: Wife was present throughout evaluation  Severity of Illness: The appropriate patient status for this patient is INPATIENT. Inpatient status is judged to be reasonable and necessary in order to provide the required intensity of service to ensure the patient's safety. The patient's presenting symptoms, physical exam findings, and initial radiographic and laboratory data in the context of their chronic comorbidities is felt to place them at high risk for further clinical deterioration. Furthermore, it is not anticipated that the patient will be medically stable for discharge from the hospital within 2 midnights of admission.   * I certify that at the point of admission it is my clinical judgment that the patient will require inpatient hospital care spanning beyond 2 midnights from the point of admission due to high intensity of service, high risk for further deterioration and high frequency of surveillance required.*  Author: Karmen Bongo, MD 09/20/2021 5:46 PM  For on call review [email or web address].

## 2021-09-20 NOTE — ED Triage Notes (Signed)
Pt [facility] from dialysis where he did not receive any of his treatment d/t tachycardia. Pt's HR was 155 at dialysis. Pt does receive hemodialysis and peritoneal dialysis. Pt did get 1.2 liters off yesterday with peritoneal. Pt is on 2L as needed. Pt also c/o SHOB and black stools since yesterday.

## 2021-09-20 NOTE — Consult Note (Signed)
Renal Service Consult Note [facility]  Gregory Tanner 09/20/2021 [name], MD Requesting Physician: Dr. Tyrone Nine, D.   Reason for Consult: ESRD pt w/ SOB HPI: The patient is a 66 y.o. year-old w/ hx liver transplant (2013), ESRD on HD, hearing loss, hx HCC (embolized 2012), pHTN, chronic dCHF, atrial fib/ flutter, cor pulmonale, DM2 and chronic hypotension who presented to ED from his HD unit in [place] due to rapid HR's in the 150s, they did not do any HD today. Pt c/o SOB and black stools starting yesterday. No f/c/s, no prod cough or CP. In ED K+ was high at 6.7, Na 122, glu 150, alb 3.4, wBC 12, Hb 13, plt 220.  EKG showed afib w/ VR 109, RBBB. We are asked to see for dialysis.   Pt seen in ED, wife is at bedside. Main c/o's are chronic abd swelling, which they are treating w/ indwelling abdominal cavity drain for home drainage of peritoneal fliud which he does daily for about 1.2- 1.5 L removal on daily basis. He wishes he "could take more off".  His usual HD is MWF, frequently he can request an extra HD on Saturday, as he did last week.    ROS - denies CP, no joint pain, no HA, no blurry vision, no rash, no diarrhea, no nausea/ vomiting, no dysuria, no difficulty voiding   Past Medical History  Past Medical History:  Diagnosis Date   Ankylosis of lumbar spine 02/21/2014   Benign essential hypertension 10/26/2015   Chronic diastolic heart failure ([place]) 09/26/2016   Chronic pain associated with significant psychosocial dysfunction 06/17/2015   Degeneration of intervertebral disc of lumbar region 07/05/2013   ESRD (end stage renal disease) on dialysis ([place]) 03/27/2017   Hearing loss    Hepatocellular carcinoma ([place]) 12/27/2010   Overview:  Embolized 6/12    History of liver transplant ([place]) 07/20/2011   Overview:  06/07/2011 for cryptogenic cirrhosis and HCC    Uncontrolled type 2 diabetes mellitus with insulin therapy 06/21/2018   Past Surgical History   Past Surgical History:  Procedure Laterality Date   AV FISTULA PLACEMENT Left 07/16/2021   Procedure: LEFT ARM RADIOCEPHALIC ARTERIOVENOUS  FISTULA CREATION;  Surgeon: [name], MD;  Location: [facility];  Service: Vascular;  Laterality: Left;   [place] STUDY  12/14/2020   Procedure: BUBBLE STUDY;  Surgeon: [name], MD;  Location: [facility];  Service: Cardiovascular;;   EYE SURGERY Left    HERNIA REPAIR     IR PARACENTESIS  11/30/2020   IR PARACENTESIS  05/20/2021   IR PARACENTESIS  06/01/2021   IR PARACENTESIS  06/17/2021   IR PARACENTESIS  07/13/2021   IR PARACENTESIS  07/20/2021   IR PARACENTESIS  07/27/2021   IR PARACENTESIS  07/30/2021   IR PARACENTESIS  08/02/2021   IR PARACENTESIS  08/09/2021   IR PERC PLEURAL DRAIN W/INDWELL CATH W/IMG GUIDE  08/27/2021   LIVER SURGERY     IMPLANT   LIVER TRANSPLANT     RIGHT HEART CATH N/A 12/11/2020   Procedure: RIGHT HEART CATH;  Surgeon: [name], MD;  Location: [facility] CV LAB;  Service: Cardiovascular;  Laterality: N/A;   TEE WITHOUT CARDIOVERSION N/A 12/14/2020   Procedure: TRANSESOPHAGEAL ECHOCARDIOGRAM (TEE);  Surgeon: [name], MD;  Location: [facility] ENDOSCOPY;  Service: Cardiovascular;  Laterality: N/A;   TONSILLECTOMY  Family History  Family History  Problem Relation Age of Onset   Cancer Mother    Diabetes Father    Heart attack Father    Schizophrenia Father    Hypertension Brother    Social History  reports that he has never smoked. He has never used smokeless tobacco. He reports current alcohol use. He reports that he does not currently use drugs. Allergies  Allergies  Allergen Reactions   Iodinated Contrast Media Hives and Other (See Comments)    Allergy is NOT to all contrast - but patient is unsure of which particular one his allergy is to. Contrast dye used before liver transplant cause hives, not sure which dye this was  but is able to use others   Jardiance [Empagliflozin] Dermatitis    developed blisters with the medication, blisters  stopped after he discontinued taking it. (About 6 months ago /~06/2020)   Latex Other (See Comments)    Skin peeling - adhesive    Metformin And Related Nausea And Vomiting   Other     Patient states that he is allergic to "some antibiotic" but is not sure of the name of it.   Rosiglitazone Nausea And Vomiting    GI effects and abdominal pain   Home medications Prior to Admission medications   Medication Sig Start Date End Date Taking? Authorizing Provider  acetaminophen (TYLENOL) 325 MG tablet Take 650 mg by mouth every 6 (six) hours as needed for mild pain.    [provider]  allopurinol (ZYLOPRIM) 300 MG tablet Take 150 mg by mouth daily. 05/07/18   [provider]  amiodarone (PACERONE) 200 MG tablet Take 1 tablet twice daily until 09/08/21, then on 09/09/21 start taking 1 tablet daily. 08/29/21   [name], Jimmy Picket, MD  bumetanide (BUMEX) 2 MG tablet Take 4 mg by mouth See admin instructions. Take 4 mg twice daily on non dialysis days. Tuesday, Thursday, and Sunday. 12/22/20 12/22/21  [provider]  calcium carbonate (TUMS - DOSED IN MG ELEMENTAL CALCIUM) 500 MG chewable tablet Chew 1 tablet by mouth daily as needed for indigestion or heartburn.    [provider]  insulin lispro (HUMALOG) 100 UNIT/ML KwikPen Inject 2-24 Units into the skin See admin instructions. Sliding scale 150-200= 2 units 201-250=3 units 251-300=4 units 301-350=5 units 8 units for small meals 24 units for High carbohydrates 09/19/20   [provider]  midodrine (PROAMATINE) 10 MG tablet Take 10 mg by mouth See admin instructions. Take '10mg'$  before dialysis on Monday, Wednesday, Friday, and  some Saturday depend on food retention 04/21/21   [provider]  Multiple Vitamin (MULTIVITAMIN) tablet Take 1 tablet by mouth daily.    [provider]  mycophenolate (CELLCEPT) 250 MG capsule Take 4 capsules (1,000 mg total) by mouth in the morning and at bedtime. 12/03/20   [name], MD  TART CHERRY PO Take 1 tablet by mouth in the morning and at bedtime.    [provider]     Vitals:   09/20/21 0915 09/20/21 0930 09/20/21 0945 09/20/21 1000  BP: 106/67 100/64 111/77 109/73  Pulse: (!) 141 (!) 141 (!) 142 (!) 141  Resp: (!) 36 (!) 30 (!) 29 16  Temp:      TempSrc:      SpO2: 99% 99% 99% 99%  Weight:      Height:       Exam Gen alert, no distress, chron ill appearing Mild ^wob No rash, cyanosis  or gangrene Sclera anicteric, throat clear  No jvd or bruits Chest clear bilat, no rales/ wheezing, dec'd at bases RRR no RG Abd soft ntnd no mass, 2-3+ ascites, +bs GU normal male MS no joint effusions or deformity Ext diffuse 2-3+ pitting bilat LE/ hip edema Neuro is alert, Ox 3 , nf    RIJ TDC+intact   Home meds include - allopurinol, amiodarone, bumetanide '4mg'$  qd non hd days, insulin lispro, midodrine 62mg pre HD mwf, MVI, mycophenolate 1 gm bid, prns/ vits/ supps     OP HD: MWF [place]  4h  400/800   84.5kg   2/2.5 bath  TDC RIJ   Hep 4000+ 25026mrun - last HD Sat (extra Rx), usual UF 3-5kg, comes off 1.5- 2.5 over - last Hb 12.5 - no esa, Fe or vdra - last pth 530, phos 7.6, Ca 8.7   CXR - no edema, +vasc congestion  Assessment/ Plan: Atrial fib/ flutter w/ RVR - getting IV amio SOB/ hx of high-output HF/ R HF/ chronic ascites and anasarca - acute / chronic vol overload, hx severe cor pulmonale w/ ascites/ edema/ anasarca very difficult to control given low systemic BP's.  H/o liver transplant (2013) ESRD - on HD MWF in [place], started HD in Feb 2023. HD today as above.  BP/ volume - BP's run low, anasarca as above. Get vol down w/ HD as tol. Gets bumex on non HD days, would continue. IDDM - on insulin Anemia esrd - Hb 14, no esa needs MBD ckd - CCa in range, will add on phos.   [facility] very poor prognosis, pt refused hospice during last hospital stay      RoKelly SplinterMD 09/20/2021, 11:32 AM Recent Labs  Lab 09/20/21 0912 09/20/21 0934  HGB 13.1 14.3  ALBUMIN 3.4*  --   CALCIUM 8.8*  --   CREATININE 5.28*  --   K 6.7* 6.8*

## 2021-09-21 ENCOUNTER — Inpatient Hospital Stay (HOSPITAL_COMMUNITY): Payer: Medicare Other

## 2021-09-21 DIAGNOSIS — A419 Sepsis, unspecified organism: Secondary | ICD-10-CM

## 2021-09-21 DIAGNOSIS — I5021 Acute systolic (congestive) heart failure: Secondary | ICD-10-CM

## 2021-09-21 DIAGNOSIS — I484 Atypical atrial flutter: Secondary | ICD-10-CM | POA: Diagnosis not present

## 2021-09-21 DIAGNOSIS — J9601 Acute respiratory failure with hypoxia: Secondary | ICD-10-CM

## 2021-09-21 DIAGNOSIS — I4891 Unspecified atrial fibrillation: Secondary | ICD-10-CM | POA: Diagnosis not present

## 2021-09-21 LAB — BODY FLUID CELL COUNT WITH DIFFERENTIAL
Eos, Fluid: 0 %
Lymphs, Fluid: 45 %
Monocyte-Macrophage-Serous Fluid: 37 % — ABNORMAL LOW (ref 50–90)
Neutrophil Count, Fluid: 18 % (ref 0–25)
Total Nucleated Cell Count, Fluid: 489 cu mm (ref 0–1000)

## 2021-09-21 LAB — RENAL FUNCTION PANEL
Albumin: 2.8 g/dL — ABNORMAL LOW (ref 3.5–5.0)
Albumin: 2.8 g/dL — ABNORMAL LOW (ref 3.5–5.0)
Anion gap: 16 — ABNORMAL HIGH (ref 5–15)
Anion gap: 15 (ref 5–15)
BUN: 49 mg/dL — ABNORMAL HIGH (ref 8–23)
BUN: 31 mg/dL — ABNORMAL HIGH (ref 8–23)
CO2: 17 mmol/L — ABNORMAL LOW (ref 22–32)
CO2: 21 mmol/L — ABNORMAL LOW (ref 22–32)
Calcium: 8.4 mg/dL — ABNORMAL LOW (ref 8.9–10.3)
Calcium: 8.2 mg/dL — ABNORMAL LOW (ref 8.9–10.3)
Chloride: 93 mmol/L — ABNORMAL LOW (ref 98–111)
Chloride: 96 mmol/L — ABNORMAL LOW (ref 98–111)
Creatinine, Ser: 4.89 mg/dL — ABNORMAL HIGH (ref 0.61–1.24)
Creatinine, Ser: 3.54 mg/dL — ABNORMAL HIGH (ref 0.61–1.24)
GFR, Estimated: 12 mL/min — ABNORMAL LOW (ref >60)
GFR, Estimated: 18 mL/min — ABNORMAL LOW (ref >60)
Glucose, Bld: 141 mg/dL — ABNORMAL HIGH (ref 70–99)
Glucose, Bld: 107 mg/dL — ABNORMAL HIGH (ref 70–99)
Phosphorus: 6.6 mg/dL — ABNORMAL HIGH (ref 2.5–4.6)
Phosphorus: 4.7 mg/dL — ABNORMAL HIGH (ref 2.5–4.6)
Potassium: 5.9 mmol/L — ABNORMAL HIGH (ref 3.5–5.1)
Potassium: 4.8 mmol/L (ref 3.5–5.1)
Sodium: 126 mmol/L — ABNORMAL LOW (ref 135–145)
Sodium: 132 mmol/L — ABNORMAL LOW (ref 135–145)

## 2021-09-21 LAB — GLUCOSE, CAPILLARY
Glucose-Capillary: 152 mg/dL — ABNORMAL HIGH (ref 70–99)
Glucose-Capillary: 143 mg/dL — ABNORMAL HIGH (ref 70–99)
Glucose-Capillary: 132 mg/dL — ABNORMAL HIGH (ref 70–99)
Glucose-Capillary: 114 mg/dL — ABNORMAL HIGH (ref 70–99)

## 2021-09-21 LAB — CBC
HCT: 40.4 % (ref 39.0–52.0)
Hemoglobin: 14 g/dL (ref 13.0–17.0)
MCH: 31.2 pg (ref 26.0–34.0)
MCHC: 34.7 g/dL (ref 30.0–36.0)
MCV: 90 fL (ref 80.0–100.0)
Platelets: 238 10*3/uL (ref 150–400)
RBC: 4.49 MIL/uL (ref 4.22–5.81)
RDW: 15.5 % (ref 11.5–15.5)
WBC: 15.8 10*3/uL — ABNORMAL HIGH (ref 4.0–10.5)
nRBC: 0 % (ref 0.0–0.2)

## 2021-09-21 LAB — BASIC METABOLIC PANEL
Anion gap: 19 — ABNORMAL HIGH (ref 5–15)
BUN: 59 mg/dL — ABNORMAL HIGH (ref 8–23)
CO2: 13 mmol/L — ABNORMAL LOW (ref 22–32)
Calcium: 8.6 mg/dL — ABNORMAL LOW (ref 8.9–10.3)
Chloride: 89 mmol/L — ABNORMAL LOW (ref 98–111)
Creatinine, Ser: 6.06 mg/dL — ABNORMAL HIGH (ref 0.61–1.24)
GFR, Estimated: 9 mL/min — ABNORMAL LOW (ref >60)
Glucose, Bld: 288 mg/dL — ABNORMAL HIGH (ref 70–99)
Potassium: 6.4 mmol/L (ref 3.5–5.1)
Sodium: 121 mmol/L — ABNORMAL LOW (ref 135–145)

## 2021-09-21 LAB — HEPARIN LEVEL (UNFRACTIONATED)
Heparin Unfractionated: 0.15 IU/mL — ABNORMAL LOW (ref 0.30–0.70)
Heparin Unfractionated: 0.59 IU/mL (ref 0.30–0.70)
Heparin Unfractionated: 0.64 IU/mL (ref 0.30–0.70)

## 2021-09-21 LAB — ECHOCARDIOGRAM COMPLETE
Area-P 1/2: 2.22 cm2
Height: 70 in
S' Lateral: 4 cm
Weight: 3181.68 oz

## 2021-09-21 LAB — TSH: TSH: 5.556 u[IU]/mL — ABNORMAL HIGH (ref 0.350–4.500)

## 2021-09-21 LAB — GLUCOSE, PLEURAL OR PERITONEAL FLUID: Glucose, Fluid: 123 mg/dL

## 2021-09-21 LAB — PROCALCITONIN
Procalcitonin: 0.3 ng/mL
Procalcitonin: 1.44 ng/mL

## 2021-09-21 LAB — MAGNESIUM: Magnesium: 2 mg/dL (ref 1.7–2.4)

## 2021-09-21 LAB — HEPATITIS B SURFACE ANTIBODY, QUANTITATIVE: Hep B S AB Quant (Post): <3.1 m[IU]/mL — ABNORMAL LOW (ref >9.9)

## 2021-09-21 LAB — PHOSPHORUS: Phosphorus: 6.5 mg/dL — ABNORMAL HIGH (ref 2.5–4.6)

## 2021-09-21 MED ORDER — MIDODRINE HCL 5 MG PO TABS: 10.0000 mg | ORAL_TABLET | ORAL | Status: DC

## 2021-09-21 MED ORDER — SODIUM CHLORIDE 0.9 % IV SOLN
2.0000 g | INTRAVENOUS | Status: AC
  Administered 2021-09-21 – 2021-09-24 (×4): 2 g via INTRAVENOUS
  Filled 2021-09-21 (×4): qty 20

## 2021-09-21 MED ORDER — ALLOPURINOL 300 MG PO TABS
150.0000 mg | ORAL_TABLET | Freq: Every morning | ORAL | Status: DC
Start: 2021-09-22 — End: 2021-09-22

## 2021-09-21 MED ORDER — ORAL CARE MOUTH RINSE: 15.0000 mL | OROMUCOSAL | Status: DC | PRN

## 2021-09-21 MED ORDER — ORAL CARE MOUTH RINSE
15.0000 mL | OROMUCOSAL | Status: DC
  Administered 2021-09-21 – 2021-09-22 (×19): 15 mL via OROMUCOSAL

## 2021-09-21 MED ORDER — DEXMEDETOMIDINE HCL IN NACL 400 MCG/100ML IV SOLN
0.4000 ug/kg/h | INTRAVENOUS | Status: DC
  Administered 2021-09-21: 0.4 ug/kg/h via INTRAVENOUS
  Administered 2021-09-21: 0.6 ug/kg/h via INTRAVENOUS
  Administered 2021-09-21: 0.8 ug/kg/h via INTRAVENOUS
  Administered 2021-09-22 (×2): 1 ug/kg/h via INTRAVENOUS
  Filled 2021-09-21: qty 200
  Filled 2021-09-21: qty 100
  Filled 2021-09-21: qty 200
  Filled 2021-09-21: qty 100

## 2021-09-21 MED ORDER — PANTOPRAZOLE SODIUM 40 MG IV SOLR
40.0000 mg | Freq: Every day | INTRAVENOUS | Status: DC
  Administered 2021-09-21 (×2): 40 mg via INTRAVENOUS
  Filled 2021-09-21 (×2): qty 10

## 2021-09-21 MED ORDER — MYCOPHENOLATE 200 MG/ML ORAL SUSPENSION
1000.0000 mg | Freq: Two times a day (BID) | ORAL | Status: DC
  Administered 2021-09-21: 1000 mg
  Filled 2021-09-21 (×3): qty 5

## 2021-09-21 MED ORDER — NOREPINEPHRINE 16 MG/250ML-% IV SOLN
0.0000 ug/min | INTRAVENOUS | Status: DC
  Administered 2021-09-21: 8 ug/min via INTRAVENOUS
  Administered 2021-09-22 – 2021-09-23 (×2): 17 ug/min via INTRAVENOUS
  Administered 2021-09-24: 12 ug/min via INTRAVENOUS
  Administered 2021-09-25: 9 ug/min via INTRAVENOUS
  Filled 2021-09-21 (×7): qty 250

## 2021-09-21 MED ORDER — MIDODRINE HCL 5 MG PO TABS
20.0000 mg | ORAL_TABLET | ORAL | Status: DC
Start: 2021-09-22 — End: 2021-09-21

## 2021-09-21 NOTE — IPAL (Signed)
  Interdisciplinary Goals of Care Family Meeting   Date carried out: 09/21/2021  Location of the meeting: Bedside  Member's involved: Nurse Practitioner and Family Member or next of kin  Durable Power of Attorney or acting medical decision maker: Wife Vickie    Discussion: We discussed goals of care for Gregory Tanner .  Vickie understands he is in the end stage of his life even prior to this hospitalization. She discussed this with him and knows he would not want to be on life support for an extended period. DNR. Will continue supportive measures for 48 hours and re-evaluate. If no progress or any worsening she would consider palliation.   Code status: Full DNR  Disposition: Continue current acute care  Time spent for the meeting: 28 minutes    Corey Harold, NP  09/21/2021, 11:40 AM

## 2021-09-21 NOTE — Progress Notes (Signed)
Progress Note  Patient Name: Gregory Tanner Date of Encounter: 09/21/2021  [facility] Cardiologist: Shirlee More, MD   Subjective   Intubated and sedated  Inpatient Medications    Scheduled Meds:  allopurinol  150 mg Oral q morning   Chlorhexidine Gluconate Cloth  6 each Topical Q0600   fentaNYL (SUBLIMAZE) injection  50 mcg Intravenous Once   insulin aspart  0-6 Units Subcutaneous TID WC   midazolam  2 mg Intravenous Once   [START ON 09/22/2021] midodrine  10 mg Oral Q M,W,F-HD   [START ON 09/22/2021] midodrine  20 mg Oral Q M,W,F   mycophenolate  1,000 mg Oral BID   mouth rinse  15 mL Mouth Rinse Q2H   pantoprazole (PROTONIX) IV  40 mg Intravenous QHS   sodium chloride flush  3 mL Intravenous Q12H   Continuous Infusions:  sodium chloride     sodium chloride     Followed by   sodium chloride     Followed by   sodium chloride     amiodarone Stopped (09/20/21 2110)   fentaNYL infusion INTRAVENOUS 100 mcg/hr (09/21/21 0600)   heparin 1,300 Units/hr (09/21/21 0600)   midazolam 2 mg/hr (09/21/21 0600)   norepinephrine (LEVOPHED) Adult infusion 16 mcg/min (09/21/21 0600)   prismasol BGK 2/2.5 dialysis solution 1,500 mL/hr at 09/21/21 0118   prismasol BGK 2/2.5 replacement solution 500 mL/hr at 09/21/21 0131   prismasol BGK 2/2.5 replacement solution 300 mL/hr at 09/21/21 0118   promethazine (PHENERGAN) injection (IM or IVPB)     PRN Meds: acetaminophen **OR** acetaminophen, calcium carbonate (dosed in mg elemental calcium), camphor-menthol **AND** hydrOXYzine, docusate sodium, docusate sodium, feeding supplement (NEPRO CARB STEADY), fentaNYL, fentaNYL, heparin, hydrALAZINE, LORazepam, midazolam, ondansetron **OR** ondansetron (ZOFRAN) IV, mouth rinse, phenylephrine, polyethylene glycol, promethazine (PHENERGAN) injection (IM or IVPB), sodium bicarbonate, sodium chloride, sorbitol, zolpidem   Vital Signs    Vitals:   09/21/21 0545 09/21/21 0600 09/21/21 0615 09/21/21  0630  BP:  (!) 98/51    Pulse: 74 74 74 74  Resp: (!) 21 (!) 24 19 (!) 24  Temp: 98.2 F (36.8 C) 98.4 F (36.9 C) 98.4 F (36.9 C) 98.6 F (37 C)  TempSrc:      SpO2: 98% 98% 98% 98%  Weight:      Height:        Intake/Output Summary (Last 24 hours) at 09/21/2021 0637 Last data filed at 09/21/2021 0600 Gross per 24 hour  Intake 1601.65 ml  Output 280 ml  Net 1321.65 ml      09/20/2021    9:05 AM 08/31/2021    1:43 PM 08/29/2021    4:10 AM  Last 3 Weights  Weight (lbs) 216 lb 0.8 oz 191 lb 182 lb 11.2 oz  Weight (kg) 98 kg 86.637 kg 82.872 kg      Telemetry    Sinus - Personally Reviewed   Physical Exam   GEN: Intubated and sedated Neck: Positive JVD Cardiac: RRR  Respiratory: CTA anteriorly GI: Positive ascites MS: 2+ edema Neuro:  Intubated and sedated Psych: Intubated and sedated  Labs    High Sensitivity Troponin:   Recent Labs  Lab 09/20/21 0912  TROPONINIHS 30*     Chemistry Recent Labs  Lab 09/20/21 0912 09/20/21 0934 09/20/21 2050 09/20/21 2310 09/21/21 0510  NA 122*   < > 116* 121* 126*  K 6.7*   < > 6.1* 6.4* 5.9*  CL 88*  --   --  89* 93*  CO2 20*  --   --  13* 17*  GLUCOSE 150*  --   --  288* 141*  BUN 49*  --   --  59* 49*  CREATININE 5.28*  --   --  6.06* 4.89*  CALCIUM 8.8*  --   --  8.6* 8.4*  MG 2.2  --   --   --  2.0  PROT 5.6*  --   --   --   --   ALBUMIN 3.4*  --   --   --  2.8*  AST 20  --   --   --   --   ALT 24  --   --   --   --   ALKPHOS 134*  --   --   --   --   BILITOT 0.9  --   --   --   --   GFRNONAA 11*  --   --  9* 12*  ANIONGAP 14  --   --  19* 16*   < > = values in this interval not displayed.     Hematology Recent Labs  Lab 09/20/21 0912 09/20/21 0934 09/20/21 2050 09/21/21 0510  WBC 12.4*  --   --  15.8*  RBC 4.19*  --   --  4.49  HGB 13.1 14.3 13.6 14.0  HCT 40.1 42.0 40.0 40.4  MCV 95.7  --   --  90.0  MCH 31.3  --   --  31.2  MCHC 32.7  --   --  34.7  RDW 16.0*  --   --  15.5  PLT 220  --    --  238   Thyroid  Recent Labs  Lab 09/21/21 0510  TSH 5.556*    BNP Recent Labs  Lab 09/20/21 0912  BNP 1,376.7*      Radiology    DG Abd 1 View  Result Date: 09/21/2021 CLINICAL DATA:  OG tube placement. EXAM: ABDOMEN - 1 VIEW COMPARISON:  None Available. FINDINGS: Tip of the enteric tube is below the diaphragm in the stomach, the side port is in the region of the distal esophagus. Advancement of 4 cm is recommended for optimal placement. Nonobstructive bowel gas pattern in the upper abdomen. IMPRESSION: Tip of the enteric tube below the diaphragm in the stomach, side-port in the region of the distal esophagus. Advancement of 4 cm is recommended for optimal placement. Electronically Signed   By: Keith Rake M.D.   On: 09/21/2021 00:52   DG CHEST PORT 1 VIEW  Result Date: 09/20/2021 CLINICAL DATA:  Respiratory failure EXAM: PORTABLE CHEST 1 VIEW COMPARISON:  None Available. FINDINGS: Endotracheal tube 6.2 cm above the carina. Right internal jugular hemodialysis catheter tip seen within the superior cavoatrial junction. Cardiac size is enlarged, unchanged. Central pulmonary vascular engorgement is again seen in keeping with changes of pulmonary arterial hypertension. No superimposed overt pulmonary edema. No pneumothorax or pleural effusion. No acute bone abnormality. IMPRESSION: 1. Support lines and tubes in appropriate position. 2. Stable cardiomegaly. 3. Pulmonary arterial hypertension. Electronically Signed   By: Fidela Salisbury M.D.   On: 09/20/2021 21:59   DG Chest Port 1 View  Result Date: 09/20/2021 CLINICAL DATA:  Shortness of breath.  Tachycardia. EXAM: PORTABLE CHEST 1 VIEW COMPARISON:  Radiograph earlier today CT 623 FINDINGS: Right-sided dialysis catheter in place. Volumes are low. Stable cardiomegaly. Improvement in vascular congestion from earlier today. Eventration of the posterior right hemidiaphragm. No pneumothorax or large pleural effusion.  No developing airspace  disease. New rounded densities projecting over the supraclavicular regions and right axilla likely represent ice packs. IMPRESSION: 1. Low lung volumes without acute abnormality. Stable cardiomegaly. 2. Right-sided dialysis catheter in place. Electronically Signed   By: Keith Rake M.D.   On: 09/20/2021 19:48   DG Chest Portable 1 View  Result Date: 09/20/2021 CLINICAL DATA:  Dyspnea EXAM: PORTABLE CHEST 1 VIEW COMPARISON:  08/19/2021 FINDINGS: Right IJ approach central venous catheter remains in place. Stable cardiomegaly. Pulmonary vascular congestion. No overt edema. No focal airspace consolidation, pleural effusion, or pneumothorax. IMPRESSION: Cardiomegaly and pulmonary vascular congestion without overt edema. Electronically Signed   By: Davina Poke D.O.   On: 09/20/2021 09:48     Patient Profile     66 year old male with past medical history of hypertension, chronic diastolic/right-sided heart failure, end-stage renal disease dialysis dependent, cirrhosis with hepatocellular carcinoma status post liver transplant, diabetes mellitus, atrial fibrillation/flutter for evaluation of recurrent atrial flutter with rapid ventricular response. Transesophageal echocardiogram October 2022 showed normal LV function, D-shaped septum, moderate right ventricular enlargement, biatrial enlargement, moderate tricuspid regurgitation, trace aortic insufficiency.  Right heart catheterization October 2022 showed pulmonary capillary wedge pressure 17 with PA pressure 66/25.  Cardiac MRI November 2022 showed no anomalous pulmonary venous return. Recently he has had progressive increased weakness, increasing dyspnea, increasing bilateral lower extremity edema.  He went to dialysis today and his heart rate was elevated in the 130-140 range.  He was sent to the emergency room. He is found to be in atrial flutter and cardiology asked to evaluate.  Assessment & Plan    1 paroxysmal atrial flutter-patient has  converted to normal sinus rhythm.  Continue IV amiodarone.  He is also receiving IV heparin.  Previously felt not to be candidate for long-term anticoagulation given comorbidities, history of subarachnoid hemorrhage and poor prognosis.   2 right-sided heart failure/ascites-dialysis has been initiated.  Status post paracentesis.   3 chronic hypotension-worse at present.  Continue norepinephrine and wean as tolerated.  Continue present dose of midodrine.   4 end-stage renal disease-dialysis per nephrology.   5 hyperkalemia-potassium 5.9 this morning which has improved.  Nephrology following and presently on dialysis.   6 history of liver transplant   For questions or updates, please contact [facility] Please consult [email or web address] for contact info under        Signed, Kirk Ruths, MD  09/21/2021, 6:37 AM

## 2021-09-21 NOTE — Progress Notes (Signed)
Pt receives out-pt HD at [facility] on MWF (sometimes on Saturday too). Will assist as needed.   Melven Sartorius Renal Navigator [phone]

## 2021-09-21 NOTE — Progress Notes (Signed)
  Echocardiogram 2D Echocardiogram has been performed.  Gregory Tanner 09/21/2021, 10:34 AM

## 2021-09-21 NOTE — Progress Notes (Signed)
PCCM INTERVAL PROGRESS NOTE  Drained 40cc of peritoneal fluid via pleurx drainage device under sterile conditions and placed into two separte specimen contained for lab analysis. Fluid was cloudy and straw colored.   Send for cell count, culture, gram stain.     Georgann Housekeeper, AGACNP-BC [facility]  See Amion for personal pager PCCM on call pager [phone] until 7pm. Please call Elink 7p-7a. [phone]  09/21/2021 12:22 PM

## 2021-09-21 NOTE — Progress Notes (Signed)
[place] for Heparin Indication: atrial fibrillation Brief A/P: Heparin level subtherapeutic Increase Heparin rate  Allergies  Allergen Reactions   Iodinated Contrast Media Hives and Other (See Comments)    Allergy is NOT to all contrast - but patient is unsure of which particular one his allergy is to. Contrast dye used before liver transplant cause hives, not sure which dye this was but is able to use others   Jardiance [Empagliflozin] Dermatitis    developed blisters with the medication, blisters  stopped after he discontinued taking it. (About 6 months ago /~06/2020)   Metformin And Related Nausea And Vomiting   Other Nausea And Vomiting    Patient states that he is allergic to "some antibiotic" but is not sure of the name of it.   Tape Other (See Comments)    Tore skin   Rosiglitazone Nausea And Vomiting    GI effects and abdominal pain    Patient Measurements: Height: '5\' 10"'$  (177.8 cm) Weight: 98 kg (216 lb 0.8 oz) IBW/kg (Calculated) : 73 Heparin Dosing Weight: 93.3 kg  Vital Signs: Temp: 98.4 F (36.9 C) (08/08 0600) Temp Source: Bladder (08/07 2200) BP: 98/51 (08/08 0600) Pulse Rate: 74 (08/08 0600)  Labs: Recent Labs    09/20/21 0912 09/20/21 0934 09/20/21 2050 09/20/21 2310 09/21/21 0510  HGB 13.1 14.3 13.6  --  14.0  HCT 40.1 42.0 40.0  --  40.4  PLT 220  --   --   --  238  LABPROT  --   --   --  14.7  --   INR  --   --   --  1.2  --   HEPARINUNFRC  --   --   --   --  0.15*  CREATININE 5.28*  --   --  6.06* 4.89*  TROPONINIHS 30*  --   --   --   --      Estimated Creatinine Clearance: 17.2 mL/min (A) (by C-G formula based on SCr of 4.89 mg/dL (H)).  Assessment: 66 y.o. male admitted with VDRF, ESRD on CRRT and Afib for heparin   Goal of Therapy:  Heparin level 0.3-0.7 units/ml Monitor platelets by anticoagulation protocol: Yes   Plan:  Increase Heparin 1550 units/hr Check heparin level in 8 hours.  Phillis Knack, PharmD, BCPS

## 2021-09-21 NOTE — Progress Notes (Signed)
NAME:  Gregory Tanner, MRN:  [id number], DOB:  1955-03-13, LOS: 1 ADMISSION DATE:  09/20/2021, CONSULTATION DATE:  09/20/21 REFERRING MD:  Lorin Mercy , CHIEF COMPLAINT:  sob, fatigue   History of Present Illness:  66 yo man with aflutter (recently dx on amio), ESRD on HD MWF started 03/2021 (just had fistula surgery in 8/31), diastolic CHF, pulmonary HTN, DM, s/p liver transplant 2013 (NASH cirrhosis), SAH after fall in feb, chronic ascites with peritonial catheter indwelling (drained daily), and chronic hypotension. Followed at [facility] for R heart failure with very high PA pressures and given a prognosis of 6 months about 5 months PTA.   He presented to [facility] ED 8/7 from HD where he was only able to receive one hour of tx due to tachycardia with rates up to 150. C/o fatigue x 2-3 days and mild chest pain. Upon arrival to the ED he was tachycardic in atrial flutter with rates 150-160. Treated with amio bolus and drip with improvement. Given CTX for SBP ppx. Had peritoneal fluid removed via indwelling cath 2.4L without improvement. While in ED the patient became more diaphoretic with worsening SOB and PCCM was consulted.  HR up to 200. Upon PCCM eval the patient was in extremis and required urgent intubation.   Pertinent  Medical History   has a past medical history of Ankylosis of lumbar spine (02/21/2014), Benign essential hypertension (10/26/2015), Chronic diastolic heart failure ([place]) (09/26/2016), Chronic pain associated with significant psychosocial dysfunction (06/17/2015), Degeneration of intervertebral disc of lumbar region (07/05/2013), ESRD (end stage renal disease) on dialysis ([place]) (03/27/2017), Hearing loss, Hepatocellular carcinoma ([place]) (12/27/2010), History of liver transplant ([place]) (07/20/2011), and Uncontrolled type 2 diabetes mellitus with insulin therapy (06/21/2018).  Bumex, allopurinol, amiodarone, insulin ss, tums, midodrine '10mg'$  pre dialysis, MVI, Cellcept '1000mg'$  qam and qhs, tart  cherry   Significant Hospital Events: Including procedures, antibiotic start and stop dates in addition to other pertinent events   8/7 admit started CRRT. 2.4L peritorneal fluid removed.   Interim History / Subjective:  No acute events overnight  Objective   Blood pressure (!) 106/50, pulse 77, temperature 98.8 F (37.1 C), temperature source Bladder, resp. rate (!) 24, height '5\' 10"'$  (1.778 m), weight 90.2 kg, SpO2 98 %.    Vent Mode: PRVC FiO2 (%):  [40 %-100 %] 40 % Set Rate:  [24 bmp] 24 bmp Vt Set:  [600 mL] 600 mL PEEP:  [5 cmH20] 5 cmH20 Plateau Pressure:  [17 cmH20-20 cmH20] 18 cmH20   Intake/Output Summary (Last 24 hours) at 09/21/2021 1008 Last data filed at 09/21/2021 0800 Gross per 24 hour  Intake 1769.62 ml  Output 638 ml  Net 1131.62 ml   Filed Weights   09/20/21 0905 09/21/21 0500  Weight: 98 kg 90.2 kg    Examination: General: elderly appearing male on vent HENT: [place]/AT, PERRL, no JVD Lungs: Clear bilateral breathsounds Cardiovascular: RRR Abdomen: distended. Soft, hypoactive Extremities: lower extremity edema.  Neuro: RASS -3 GU: Foley  Echocardiogram on 11/23/2020 showed EF 65-70%, mild LVH, grade 2 diastolic dysfunction, intraventricular septal flattening in systole consistent with RV pressure overload, severely elevated pulmonary artery systolic pressure, severely dilated LA and RA.   Right heart catheterization on 12/11/2020 showed mildly elevated PCWP, primarily RV failure.   PA 66/25 with PACWP 17  TEE on 12/14/2020 that showed LVEF 60-65%, D-shaped septum suggestive of RV pressure/volume overload, mildly reduced RV systolic function, moderate enlargement of LV, moderate dilation of LA and RA, moderate tricuspid valve regurgitation.  Cardiac MRI on 12/15/2020 showed that there was no anomalous pulmonary venous return.  Sig labs Na 126 K 5.9 Bicarb 17 Phos 6.5 WBC 15.8  Resolved Hospital Problem list     Assessment & Plan:    Shock:  septic vs cardiogenic. Known end stage RV failure. Cannot rule out infection, namely peritonitis. - NE for MAP goal 65 mmHg - Avoid neo - echo pending - Will send peritoneal fluid - start CTX for ? Sbp vs ppx - consider milrinone for RV support if shock worsens.  - Co-ox but CVL is fem. If we stop CRRT can pull from perm cath RN aware.   ESRD on HD Hyperkalemia - CRRT per nephrology - Serial chemistry - Careful UF  Acute respiratory failure with hypoxia - full vent support - wean for spO2 > 90% - Not a candidate for SBT at this time. Perhaps tomorrow.  Afib/flutter:  - Amio - Heparin - Cardiology following  HFpEF likely end stage Pulmonary hypertension severe RV failure - holding home Bumex - Family understand poor prognosis   Liver transplant: - pharmacy trying to obtain mycophenolate solution    Best Practice (right click and "Reselect all SmartList Selections" daily)   Diet/type: NPO DVT prophylaxis: systemic heparin , not on home AC GI prophylaxis: PPI Lines: Central line Foley:  N/A Code Status:  full code Last date of multidisciplinary goals of care discussion '[]'$   Labs   CBC: Recent Labs  Lab 09/20/21 0912 09/20/21 0934 09/20/21 2050 09/21/21 0510  WBC 12.4*  --   --  15.8*  NEUTROABS 10.4*  --   --   --   HGB 13.1 14.3 13.6 14.0  HCT 40.1 42.0 40.0 40.4  MCV 95.7  --   --  90.0  PLT 220  --   --  238     Basic Metabolic Panel: Recent Labs  Lab 09/20/21 0912 09/20/21 0934 09/20/21 1200 09/20/21 2050 09/20/21 2310 09/21/21 0510  NA 122* 119*  --  116* 121* 126*  K 6.7* 6.8*  --  6.1* 6.4* 5.9*  CL 88*  --   --   --  89* 93*  CO2 20*  --   --   --  13* 17*  GLUCOSE 150*  --   --   --  288* 141*  BUN 49*  --   --   --  59* 49*  CREATININE 5.28*  --   --   --  6.06* 4.89*  CALCIUM 8.8*  --   --   --  8.6* 8.4*  MG 2.2  --   --   --   --  2.0  PHOS  --   --  7.7*  --   --  6.5*  6.6*    GFR: Estimated Creatinine Clearance: 16.6  mL/min (A) (by C-G formula based on SCr of 4.89 mg/dL (H)). Recent Labs  Lab 09/20/21 0912 09/20/21 2310 09/21/21 0510  PROCALCITON  --  0.30 1.44  WBC 12.4*  --  15.8*     Liver Function Tests: Recent Labs  Lab 09/20/21 0912 09/21/21 0510  AST 20  --   ALT 24  --   ALKPHOS 134*  --   BILITOT 0.9  --   PROT 5.6*  --   ALBUMIN 3.4* 2.8*    No results for input(s): "LIPASE", "AMYLASE" in the last 168 hours. No results for input(s): "AMMONIA" in the last 168 hours.  ABG    Component Value Date/Time  PHART 7.308 (L) 09/20/2021 2050   PCO2ART 31.5 (L) 09/20/2021 2050   PO2ART 457 (H) 09/20/2021 2050   HCO3 15.8 (L) 09/20/2021 2050   TCO2 17 (L) 09/20/2021 2050   ACIDBASEDEF 9.0 (H) 09/20/2021 2050   O2SAT 100 09/20/2021 2050     Coagulation Profile: Recent Labs  Lab 09/20/21 2310  INR 1.2    Cardiac Enzymes: No results for input(s): "CKTOTAL", "CKMB", "CKMBINDEX", "TROPONINI" in the last 168 hours.  HbA1C: Hgb A1c MFr Bld  Date/Time Value Ref Range Status  12/06/2020 02:40 AM 5.2 4.8 - 5.6 % Final    Comment:    (NOTE) Pre diabetes:          5.7%-6.4%  Diabetes:              >6.4%  Glycemic control for   <7.0% adults with diabetes   07/24/2019 03:39 PM 6.0 (H) 4.8 - 5.6 % Final    Comment:    (NOTE) Pre diabetes:          5.7%-6.4% Diabetes:              >6.4% Glycemic control for   <7.0% adults with diabetes     CBG: Recent Labs  Lab 09/20/21 1017 09/20/21 1527 09/21/21 0755  GLUCAP 155* 193* 152*     Review of Systems:   Unable   Past Medical History:  He,  has a past medical history of Ankylosis of lumbar spine (02/21/2014), Benign essential hypertension (10/26/2015), Chronic diastolic heart failure ([place]) (09/26/2016), Chronic pain associated with significant psychosocial dysfunction (06/17/2015), Degeneration of intervertebral disc of lumbar region (07/05/2013), ESRD (end stage renal disease) on dialysis ([place]) (03/27/2017), Hearing  loss, Hepatocellular carcinoma ([place]) (12/27/2010), History of liver transplant ([place]) (07/20/2011), and Uncontrolled type 2 diabetes mellitus with insulin therapy (06/21/2018).   Surgical History:   Past Surgical History:  Procedure Laterality Date   AV FISTULA PLACEMENT Left 07/16/2021   Procedure: LEFT ARM RADIOCEPHALIC ARTERIOVENOUS  FISTULA CREATION;  Surgeon: [name], MD;  Location: [place];  Service: Vascular;  Laterality: Left;   BACK SURGERY     BAKER CYST SURGERY     RIGHT LEG   BUBBLE STUDY  12/14/2020   Procedure: BUBBLE STUDY;  Surgeon: [name], MD;  Location: [facility];  Service: Cardiovascular;;   EYE SURGERY Left    HERNIA REPAIR     IR PARACENTESIS  11/30/2020   IR PARACENTESIS  05/20/2021   IR PARACENTESIS  06/01/2021   IR PARACENTESIS  06/17/2021   IR PARACENTESIS  07/13/2021   IR PARACENTESIS  07/20/2021   IR PARACENTESIS  07/27/2021   IR PARACENTESIS  07/30/2021   IR PARACENTESIS  08/02/2021   IR PARACENTESIS  08/09/2021   IR PERC PLEURAL DRAIN W/INDWELL CATH W/IMG GUIDE  08/27/2021   LIVER SURGERY     IMPLANT   LIVER TRANSPLANT     RIGHT HEART CATH N/A 12/11/2020   Procedure: RIGHT HEART CATH;  Surgeon: [name], MD;  Location: [facility];  Service: Cardiovascular;  Laterality: N/A;   TEE WITHOUT CARDIOVERSION N/A 12/14/2020   Procedure: TRANSESOPHAGEAL ECHOCARDIOGRAM (TEE);  Surgeon: [name], MD;  [facility] ENDOSCOPY;  Service: Cardiovascular;  Laterality: N/A;   TONSILLECTOMY       Social History:   reports that he has never smoked. He has never used smokeless tobacco. He reports current alcohol use. He reports that he does not currently use drugs.   Family History:  His family history includes Cancer in his mother; Diabetes in his father; Heart attack in his father; Hypertension in his brother; Schizophrenia in his father.   Allergies Allergies  Allergen Reactions   Iodinated Contrast Media Hives and  Other (See Comments)    Allergy is NOT to all contrast - but patient is unsure of which particular one his allergy is to. Contrast dye used before liver transplant cause hives, not sure which dye this was but is able to use others   Jardiance [Empagliflozin] Dermatitis    developed blisters with the medication, blisters  stopped after he discontinued taking it. (About 6 months ago /~06/2020)   Metformin And Related Nausea And Vomiting   Other Nausea And Vomiting    Patient states that he is allergic to "some antibiotic" but is not sure of the name of it.   Tape Other (See Comments)    Tore skin   Rosiglitazone Nausea And Vomiting    GI effects and abdominal pain     Home Medications  Prior to Admission medications   Medication Sig Start Date End Date Taking? Authorizing Provider  acetaminophen (TYLENOL) 500 MG tablet Take 1,000 mg by mouth See admin instructions. Take 2 tablets (1000 mg) by mouth in the morning on dialysis days - Monday, Wednesday, Friday and sometimes Saturday   Yes [provider]  allopurinol (ZYLOPRIM) 300 MG tablet Take 150 mg by mouth every morning. 05/07/18  Yes [provider]  amiodarone (PACERONE) 200 MG tablet Take 1 tablet twice daily until 09/08/21, then on 09/09/21 start taking 1 tablet daily. Patient taking differently: Take 200 mg by mouth every morning. 08/29/21  Yes [name], Jimmy Picket, MD  bumetanide (BUMEX) 2 MG tablet Take 4 mg by mouth See admin instructions. Take 4 mg twice daily on non dialysis days: Tuesday, Thursday, and Sunday. 12/22/20 12/22/21 Yes [provider]  calcium carbonate (TUMS EX) 750 MG chewable tablet Chew 2 tablets by mouth daily as needed for heartburn (indigestion/acid reflux).   Yes [provider]  insulin lispro (HUMALOG) 100 UNIT/ML KwikPen Inject 2-5 Units into the skin See admin instructions. Inject 2-5 units subcutaneously three time daily before meals per sliding scale: CBG 150-200= 2  units, 201-250=3 units, 251-300=4 units, 301-350=5 units 09/19/20  Yes [provider]  Lidocaine HCl (ASPERCREME LIDOCAINE) 4 % CREA Apply 1 Application topically once as needed (prior to port access).   Yes [provider]  midodrine (PROAMATINE) 10 MG tablet Take 10-20 mg by mouth See admin instructions. Take 2 tablets (20 mg) by mouth on Monday, Wednesday and Friday (and sometimes Saturday) before dialysis. Take 1 tablet (10 mg) during dialysis. 04/21/21  Yes [provider]  Multiple Vitamin (MULTIVITAMIN WITH MINERALS) TABS tablet Take 1 tablet by mouth every morning.   Yes [provider]  mycophenolate (CELLCEPT) 250 MG capsule Take 4 capsules (1,000 mg total) by mouth in the morning and at bedtime. 12/03/20  Yes [name], MD  OVER THE COUNTER MEDICATION Apply 1 Application topically every other day. Over the counter cream to dry up blisters   Yes [provider]  TART CHERRY PO Take 1 capsule by mouth in the morning and at bedtime.   Yes [provider]     Critical care time: 48 min     Georgann Housekeeper, AGACNP-BC [place] for personal pager PCCM on call pager [phone] until 7pm. Please call Elink 7p-7a. [phone]  09/21/2021 11:25 AM

## 2021-09-21 NOTE — Progress Notes (Signed)
[facility] Progress Note  Subjective: had 2.4 L para, also required intubation after that. Vasopressors were started. CRRT was started.   Vitals:   09/21/21 0630 09/21/21 0700 09/21/21 0800 09/21/21 0804  BP:  (!) 116/57 (!) 106/50 (!) 106/50  Pulse: 74 77 76 77  Resp: (!) 24 (!) 26 (!) 23 (!) 24  Temp: 98.6 F (37 C) 98.6 F (37 C) 98.8 F (37.1 C)   TempSrc:   Bladder   SpO2: 98% 98% 98% 98%  Weight:      Height:        Exam: Gen alert, no distress, chron ill appearing No jvd or bruits Chest clear bilat, no rales/ wheezing RRR no RG Abd soft ntnd no mass, 2-3+ ascites, +bs Ext diffuse 2-3+ pitting bilat LE/ hip edema Neuro is alert, Ox 3 , nf    RIJ TDC+intact    Home meds include - allopurinol, amiodarone, bumetanide '4mg'$  qd non hd days, insulin lispro, midodrine 76mg pre HD mwf, MVI, mycophenolate 1 gm bid, prns/ vits/ supps        OP HD: MWF [facility]  4h  400/800   84.5kg   2/2.5 bath  TDC RIJ   Hep 4000+ [id number] - last HD Sat (extra Rx), usual UF 3-5kg, comes off 1.5- 2.5 over - last Hb 12.5 - no esa, Fe or vdra - last pth 530, phos 7.6, Ca 8.7    CXR 8/7 - no edema, +vasc congestion   Assessment/ Plan: Atrial fib/ flutter w/ RVR - IV amio started, converted to NSR overnight. Per pmd/ cards.  SOB/ hx of high-output HF/ R HF/ chronic ascites / hx severe cor pulmonale / anasarca - w/ chronic ascites/ edema anasarca very difficult to control in OP setting given low systemic BP's. Will ^ UF to 100-  250 cc/hr as tolerated on CRRT.  ESRD - on HD MWF in [place], started HD in Feb 2023. Started CRRT here on 8/7. Cont CRRT for now.  Hypotension - on midodrine pre HD at home. Started on levo gtt now.  IDDM - on insulin, per pmd Anemia esrd - Hb 14, no esa needs MBD ckd - CCa in range, phos slightly up.  H/o liver transplant (2013) [facility] very poor prognosis, pt refused hospice during last hospital stay          RoKelly Splinter/09/2021, 8:48  AM   Recent Labs  Lab 09/20/21 0912 09/20/21 0934 09/20/21 1200 09/20/21 2050 09/20/21 2310 09/21/21 0510  HGB 13.1   < >  --  13.6  --  14.0  ALBUMIN 3.4*  --   --   --   --  2.8*  CALCIUM 8.8*  --   --   --  8.6* 8.4*  PHOS  --   --  7.7*  --   --  6.5*  6.6*  CREATININE 5.28*  --   --   --  6.06* 4.89*  K 6.7*   < >  --  6.1* 6.4* 5.9*   < > = values in this interval not displayed.   No results for input(s): "IRON", "TIBC", "FERRITIN" in the last 168 hours. Inpatient medications:  allopurinol  150 mg Oral q morning   Chlorhexidine Gluconate Cloth  6 each Topical Q0600   fentaNYL (SUBLIMAZE) injection  50 mcg Intravenous Once   insulin aspart  0-6 Units Subcutaneous TID WC   midazolam  2 mg Intravenous Once   [START ON 09/22/2021] midodrine  10  mg Oral Q M,W,F-HD   [START ON 09/22/2021] midodrine  20 mg Oral Q M,W,F   mycophenolate  1,000 mg Oral BID   mouth rinse  15 mL Mouth Rinse Q2H   pantoprazole (PROTONIX) IV  40 mg Intravenous QHS   sodium chloride flush  3 mL Intravenous Q12H    sodium chloride     sodium chloride     Followed by   sodium chloride     Followed by   sodium chloride     amiodarone Stopped (09/20/21 2110)   fentaNYL infusion INTRAVENOUS 75 mcg/hr (09/21/21 0800)   heparin 1,550 Units/hr (09/21/21 0800)   midazolam 1 mg/hr (09/21/21 0800)   norepinephrine (LEVOPHED) Adult infusion 16 mcg/min (09/21/21 0800)   prismasol BGK 2/2.5 dialysis solution 1,500 mL/hr at 09/21/21 0804   prismasol BGK 2/2.5 replacement solution 500 mL/hr at 09/21/21 0131   prismasol BGK 2/2.5 replacement solution 300 mL/hr at 09/21/21 0118   promethazine (PHENERGAN) injection (IM or IVPB)     acetaminophen **OR** acetaminophen, calcium carbonate (dosed in mg elemental calcium), camphor-menthol **AND** hydrOXYzine, docusate sodium, docusate sodium, feeding supplement (NEPRO CARB STEADY), fentaNYL, fentaNYL, heparin, hydrALAZINE, LORazepam, midazolam, ondansetron **OR**  ondansetron (ZOFRAN) IV, mouth rinse, phenylephrine, polyethylene glycol, promethazine (PHENERGAN) injection (IM or IVPB), sodium bicarbonate, sodium chloride, sorbitol, zolpidem

## 2021-09-21 NOTE — Progress Notes (Signed)
[place] for Heparin Indication: atrial fibrillation Brief A/P: Heparin level subtherapeutic Increase Heparin rate  Allergies  Allergen Reactions   Iodinated Contrast Media Hives and Other (See Comments)    Allergy is NOT to all contrast - but patient is unsure of which particular one his allergy is to. Contrast dye used before liver transplant cause hives, not sure which dye this was but is able to use others   Jardiance [Empagliflozin] Dermatitis    developed blisters with the medication, blisters  stopped after he discontinued taking it. (About 6 months ago /~06/2020)   Metformin And Related Nausea And Vomiting   Other Nausea And Vomiting    Patient states that he is allergic to "some antibiotic" but is not sure of the name of it.   Tape Other (See Comments)    Tore skin   Rosiglitazone Nausea And Vomiting    GI effects and abdominal pain    Patient Measurements: Height: '5\' 10"'$  (177.8 cm) Weight: 90.2 kg (198 lb 13.7 oz) IBW/kg (Calculated) : 73 Heparin Dosing Weight: 93.3 kg  Vital Signs: Temp: 97.5 F (36.4 C) (08/08 1500) Temp Source: Bladder (08/08 0800) BP: 116/59 (08/08 1524) Pulse Rate: 82 (08/08 1524)  Labs: Recent Labs    09/20/21 0912 09/20/21 0934 09/20/21 2050 09/20/21 2310 09/21/21 0510 09/21/21 1423  HGB 13.1 14.3 13.6  --  14.0  --   HCT 40.1 42.0 40.0  --  40.4  --   PLT 220  --   --   --  238  --   LABPROT  --   --   --  14.7  --   --   INR  --   --   --  1.2  --   --   HEPARINUNFRC  --   --   --   --  0.15* 0.59  CREATININE 5.28*  --   --  6.06* 4.89*  --   TROPONINIHS 30*  --   --   --   --   --      Estimated Creatinine Clearance: 16.6 mL/min (A) (by C-G formula based on SCr of 4.89 mg/dL (H)).  Assessment: 66 y.o. male admitted with VDRF, ESRD on CRRT and Afib. Pharmacy consulted to dose heparin for afib.   Heparin level therapeutic at 0.59 after rate increase. CBC is stable. No bleeding  noted.  Goal of Therapy:  Heparin level 0.3-0.7 units/ml Monitor platelets by anticoagulation protocol: Yes   Plan:  Continue heparin 1550 units/hr 8 hour confirmatory heparin level  Check heparin level in 8 hours.  Thank you for allowing pharmacy to participate in this patient's care.  Reatha Harps, PharmD PGY2 Pharmacy Resident 09/21/2021 3:37 PM Check [email or web address] for unit specific pharmacy number

## 2021-09-22 ENCOUNTER — Inpatient Hospital Stay (HOSPITAL_COMMUNITY): Payer: Medicare Other

## 2021-09-22 DIAGNOSIS — I4891 Unspecified atrial fibrillation: Secondary | ICD-10-CM | POA: Diagnosis not present

## 2021-09-22 DIAGNOSIS — I4892 Unspecified atrial flutter: Secondary | ICD-10-CM | POA: Diagnosis not present

## 2021-09-22 LAB — COMPREHENSIVE METABOLIC PANEL
ALT: 28 U/L (ref 0–44)
AST: 25 U/L (ref 15–41)
Albumin: 2.8 g/dL — ABNORMAL LOW (ref 3.5–5.0)
Alkaline Phosphatase: 106 U/L (ref 38–126)
Anion gap: 15 (ref 5–15)
BUN: 25 mg/dL — ABNORMAL HIGH (ref 8–23)
CO2: 21 mmol/L — ABNORMAL LOW (ref 22–32)
Calcium: 8.1 mg/dL — ABNORMAL LOW (ref 8.9–10.3)
Chloride: 97 mmol/L — ABNORMAL LOW (ref 98–111)
Creatinine, Ser: 3.08 mg/dL — ABNORMAL HIGH (ref 0.61–1.24)
GFR, Estimated: 21 mL/min — ABNORMAL LOW (ref >60)
Glucose, Bld: 125 mg/dL — ABNORMAL HIGH (ref 70–99)
Potassium: 4.4 mmol/L (ref 3.5–5.1)
Sodium: 133 mmol/L — ABNORMAL LOW (ref 135–145)
Total Bilirubin: 1.1 mg/dL (ref 0.3–1.2)
Total Protein: 4.8 g/dL — ABNORMAL LOW (ref 6.5–8.1)

## 2021-09-22 LAB — CBC
HCT: 40.5 % (ref 39.0–52.0)
Hemoglobin: 13.4 g/dL (ref 13.0–17.0)
MCH: 30.9 pg (ref 26.0–34.0)
MCHC: 33.1 g/dL (ref 30.0–36.0)
MCV: 93.3 fL (ref 80.0–100.0)
Platelets: 137 10*3/uL — ABNORMAL LOW (ref 150–400)
RBC: 4.34 MIL/uL (ref 4.22–5.81)
RDW: 15.9 % — ABNORMAL HIGH (ref 11.5–15.5)
WBC: 13 10*3/uL — ABNORMAL HIGH (ref 4.0–10.5)
nRBC: 0 % (ref 0.0–0.2)

## 2021-09-22 LAB — GLUCOSE, CAPILLARY
Glucose-Capillary: 118 mg/dL — ABNORMAL HIGH (ref 70–99)
Glucose-Capillary: 111 mg/dL — ABNORMAL HIGH (ref 70–99)
Glucose-Capillary: 108 mg/dL — ABNORMAL HIGH (ref 70–99)
Glucose-Capillary: 118 mg/dL — ABNORMAL HIGH (ref 70–99)

## 2021-09-22 LAB — COOXEMETRY PANEL
Carboxyhemoglobin: 1.8 % — ABNORMAL HIGH (ref 0.5–1.5)
Methemoglobin: <0.7 % (ref 0.0–1.5)
O2 Saturation: 83.4 %
Total hemoglobin: 12.6 g/dL (ref 12.0–16.0)

## 2021-09-22 LAB — HEPARIN LEVEL (UNFRACTIONATED)
Heparin Unfractionated: 0.6 IU/mL (ref 0.30–0.70)
Heparin Unfractionated: 0.75 IU/mL — ABNORMAL HIGH (ref 0.30–0.70)

## 2021-09-22 LAB — PHOSPHORUS: Phosphorus: 4.6 mg/dL (ref 2.5–4.6)

## 2021-09-22 LAB — MAGNESIUM: Magnesium: 2.4 mg/dL (ref 1.7–2.4)

## 2021-09-22 LAB — PROCALCITONIN: Procalcitonin: 2.45 ng/mL

## 2021-09-22 MED ORDER — ALLOPURINOL 300 MG PO TABS
150.0000 mg | ORAL_TABLET | Freq: Every morning | ORAL | Status: DC
Start: 2021-09-22 — End: 2021-09-27
  Administered 2021-09-22 – 2021-09-26 (×5): 150 mg via ORAL
  Filled 2021-09-22 (×5): qty 1

## 2021-09-22 MED ORDER — POLYVINYL ALCOHOL 1.4 % OP SOLN
1.0000 [drp] | OPHTHALMIC | Status: DC | PRN
  Administered 2021-09-22: 1 [drp] via OPHTHALMIC
  Filled 2021-09-22: qty 15

## 2021-09-22 MED ORDER — PRISMASOL BGK 4/2.5 32-4-2.5 MEQ/L EC SOLN: Status: DC

## 2021-09-22 MED ORDER — ORAL CARE MOUTH RINSE: 15.0000 mL | OROMUCOSAL | Status: DC | PRN

## 2021-09-22 MED ORDER — MYCOPHENOLATE MOFETIL 250 MG PO CAPS
1000.0000 mg | ORAL_CAPSULE | Freq: Two times a day (BID) | ORAL | Status: DC
  Administered 2021-09-22 – 2021-09-26 (×10): 1000 mg via ORAL
  Filled 2021-09-22 (×12): qty 4

## 2021-09-22 MED ORDER — AMIODARONE HCL 200 MG PO TABS
200.0000 mg | ORAL_TABLET | Freq: Every day | ORAL | Status: DC
  Administered 2021-09-22 – 2021-09-26 (×5): 200 mg via ORAL
  Filled 2021-09-22 (×5): qty 1

## 2021-09-22 MED ORDER — MIDODRINE HCL 5 MG PO TABS
5.0000 mg | ORAL_TABLET | Freq: Three times a day (TID) | ORAL | Status: DC
  Administered 2021-09-22 – 2021-09-23 (×3): 5 mg via ORAL
  Filled 2021-09-22 (×2): qty 1

## 2021-09-22 MED ORDER — HEPARIN SODIUM (PORCINE) 1000 UNIT/ML DIALYSIS
1000.0000 [IU] | INTRAMUSCULAR | Status: AC
  Administered 2021-09-22: 6000 [IU] via INTRAVENOUS_CENTRAL
  Filled 2021-09-22 (×2): qty 6

## 2021-09-22 NOTE — Progress Notes (Signed)
Back to full code.

## 2021-09-22 NOTE — Procedures (Signed)
INDICATION: Patient with cirrhosis, recurrent ascites s/p abdominal PleurX catheter placement 08/27/21. Request made for catheter assessment, fluid removal.   EXAM: IR EVAL AND MANAGEMENT   MEDICATIONS: None.   COMPLICATIONS: None immediate.   PROCEDURE: A timeout was performed prior to the initiation of the procedure.   PleurX catheter was sterilely prepped and draped. A catheter access kit was used to connect the Plerux with a drainage bottle. Paracentesis/fluid removal was performed with removal of 2.4 liters of cloudy, yellow fluid. Access was disconnected and catheter redressed with gauze and Tegaderm.   The patient tolerated the procedure well without immediate post procedural complication.   FINDINGS: A total of approximately 2.4 liters of cloudy, yellow fluid was removed.   IMPRESSION: Successful access of PleurX catheter yielding 2.4 liters of peritoneal fluid.   Read by: Brynda Greathouse PA-C     Electronically Signed   By: Corrie Mckusick D.O.   On: 09/22/2021 11:39

## 2021-09-22 NOTE — Procedures (Signed)
Extubation Procedure Note  Patient Details:   Name: Gregory Tanner DOB: [birth year] MRN: [id number]   Airway Documentation:    Vent end date: 09/22/21 Vent end time: 0912   Evaluation  O2 sats: stable throughout Complications: No apparent complications Patient did tolerate procedure well. Bilateral Breath Sounds: Clear, Diminished   Yes  Patient extubated per MD order. Positive cuff leak. No stridor noted. Vitals are stable on 3L [place]. RN at bedside.  Sylvestre Rathgeber H Henryetta Corriveau 09/22/2021, 9:12 AM

## 2021-09-22 NOTE — Progress Notes (Signed)
CVP 6, coox 84 Near dry weight with standing. Feels well. Levo at 20. Even pull, restart home midodrine, will send FYI to Dr. Jonnie Finner.  Will restart daily pleurX draining tomorrow.  Erskine Emery MD PCCM

## 2021-09-22 NOTE — Progress Notes (Signed)
[place] for Heparin Indication: atrial fibrillation  Allergies  Allergen Reactions   Iodinated Contrast Media Hives and Other (See Comments)    Allergy is NOT to all contrast - but patient is unsure of which particular one his allergy is to. Contrast dye used before liver transplant cause hives, not sure which dye this was but is able to use others   Jardiance [Empagliflozin] Dermatitis    developed blisters with the medication, blisters  stopped after he discontinued taking it. (About 6 months ago /~06/2020)   Metformin And Related Nausea And Vomiting   Other Nausea And Vomiting    Patient states that he is allergic to "some antibiotic" but is not sure of the name of it.   Tape Other (See Comments)    Tore skin   Rosiglitazone Nausea And Vomiting    GI effects and abdominal pain    Patient Measurements: Height: '5\' 10"'$  (177.8 cm) Weight: 84.4 kg (186 lb 1.1 oz) IBW/kg (Calculated) : 73 Heparin Dosing Weight: 93.3 kg  Vital Signs: Temp: 99.1 F (37.3 C) (08/09 2003) Temp Source: Axillary (08/09 2003) BP: 121/65 (08/09 2000) Pulse Rate: 91 (08/09 2000)  Labs: Recent Labs    09/20/21 0912 09/20/21 0934 09/20/21 2050 09/20/21 2310 09/21/21 0510 09/21/21 1423 09/21/21 1750 09/21/21 2217 09/22/21 0411 09/22/21 1946  HGB 13.1   < > 13.6  --  14.0  --   --   --  13.4  --   HCT 40.1   < > 40.0  --  40.4  --   --   --  40.5  --   PLT 220  --   --   --  238  --   --   --  137*  --   LABPROT  --   --   --  14.7  --   --   --   --   --   --   INR  --   --   --  1.2  --   --   --   --   --   --   HEPARINUNFRC  --   --   --   --  0.15*   < >  --  0.64 0.60 0.75*  CREATININE 5.28*  --   --  6.06* 4.89*  --  3.54*  --  3.08*  --   TROPONINIHS 30*  --   --   --   --   --   --   --   --   --    < > = values in this interval not displayed.     Estimated Creatinine Clearance: 24 mL/min (A) (by C-G formula based on SCr of 3.08 mg/dL  (H)).  Assessment: 66 y.o. male admitted with VDRF, ESRD on CRRT and Afib. Pharmacy consulted to dose heparin for Afib. Not on AC PTA    Heparin level came back slightly supratherapeutic this PM at 0.75. Rn couldn't tell if they blood in the urine has gotten any worst since not output recently. We will decrease rate and check level in AM.   Goal of Therapy:  Heparin level 0.3-0.7 units/ml Monitor platelets by anticoagulation protocol: Yes   Plan:  Decrease heparin 1450 units/hr Heparin level in AM Monitor daily HL, CBC, and for s/sx of bleeding  F/u [facility] plan  Onnie Boer, PharmD, BCIDP, AAHIVP, CPP Infectious Disease Pharmacist 09/22/2021 8:42 PM

## 2021-09-22 NOTE — Progress Notes (Signed)
ANTICOAGULATION CONSULT NOTE - Follow Up Consult  Pharmacy Consult for heparin Indication: atrial fibrillation  Labs: Recent Labs    09/20/21 0912 09/20/21 0934 09/20/21 2050 09/20/21 2310 09/21/21 0510 09/21/21 1423 09/21/21 1750 09/21/21 2217  HGB 13.1 14.3 13.6  --  14.0  --   --   --   HCT 40.1 42.0 40.0  --  40.4  --   --   --   PLT 220  --   --   --  238  --   --   --   LABPROT  --   --   --  14.7  --   --   --   --   INR  --   --   --  1.2  --   --   --   --   HEPARINUNFRC  --   --   --   --  0.15* 0.59  --  0.64  CREATININE 5.28*  --   --  6.06* 4.89*  --  3.54*  --   TROPONINIHS 30*  --   --   --   --   --   --   --     Assessment/Plan:  66yo male remains therapeutic on heparin. Will continue infusion at current rate of 1550 units/hr and monitor daily level.   Wynona Neat, PharmD, BCPS  09/22/2021,12:27 AM

## 2021-09-22 NOTE — Progress Notes (Signed)
Progress Note  Patient Name: Gregory Tanner Date of Encounter: 09/22/2021  [facility] Cardiologist: Shirlee More, MD   Subjective   Intubated; trying to communicate by writing but not legible  Inpatient Medications    Scheduled Meds:  allopurinol  150 mg Per Tube q morning   Chlorhexidine Gluconate Cloth  6 each Topical Q0600   fentaNYL (SUBLIMAZE) injection  50 mcg Intravenous Once   insulin aspart  0-6 Units Subcutaneous TID WC   midazolam  2 mg Intravenous Once   mycophenolate  1,000 mg Per Tube BID   mouth rinse  15 mL Mouth Rinse Q2H   pantoprazole (PROTONIX) IV  40 mg Intravenous QHS   sodium chloride flush  3 mL Intravenous Q12H   Continuous Infusions:  sodium chloride     sodium chloride     Followed by   sodium chloride     Followed by   sodium chloride     amiodarone Stopped (09/20/21 2110)   cefTRIAXone (ROCEPHIN)  IV Stopped (09/21/21 1915)   dexmedetomidine (PRECEDEX) IV infusion 1.2 mcg/kg/hr (09/22/21 0700)   fentaNYL infusion INTRAVENOUS 75 mcg/hr (09/22/21 0700)   heparin 1,550 Units/hr (09/22/21 0700)   norepinephrine (LEVOPHED) Adult infusion 15 mcg/min (09/22/21 0700)   prismasol BGK 2/2.5 dialysis solution 1,500 mL/hr at 09/22/21 0608   prismasol BGK 2/2.5 replacement solution 500 mL/hr at 09/21/21 2124   prismasol BGK 2/2.5 replacement solution 300 mL/hr at 09/21/21 0118   promethazine (PHENERGAN) injection (IM or IVPB)     PRN Meds: acetaminophen **OR** acetaminophen, calcium carbonate (dosed in mg elemental calcium), camphor-menthol **AND** hydrOXYzine, docusate sodium, docusate sodium, feeding supplement (NEPRO CARB STEADY), fentaNYL, fentaNYL, heparin, hydrALAZINE, LORazepam, midazolam, ondansetron **OR** ondansetron (ZOFRAN) IV, mouth rinse, phenylephrine, polyethylene glycol, promethazine (PHENERGAN) injection (IM or IVPB), sodium bicarbonate, sodium chloride, sorbitol, zolpidem   Vital Signs    Vitals:   09/22/21 0615 09/22/21 0630  09/22/21 0645 09/22/21 0700  BP:  117/63  (!) 112/58  Pulse: 90 89 91 91  Resp: (!) 24 (!) 21 (!) 21 (!) 24  Temp: 98.8 F (37.1 C) 98.8 F (37.1 C) 98.8 F (37.1 C) 98.8 F (37.1 C)  TempSrc:      SpO2: 100% 100% 100% 97%  Weight:      Height:        Intake/Output Summary (Last 24 hours) at 09/22/2021 0750 Last data filed at 09/22/2021 0700 Gross per 24 hour  Intake 1670.1 ml  Output 4813 ml  Net -3142.9 ml       09/22/2021    4:17 AM 09/21/2021    5:00 AM 09/20/2021    9:05 AM  Last 3 Weights  Weight (lbs) 194 lb 14.2 oz 198 lb 13.7 oz 216 lb 0.8 oz  Weight (kg) 88.4 kg 90.2 kg 98 kg      Telemetry    Sinus - Personally Reviewed   Physical Exam   GEN: Intubated; alert Neck: Positive JVD Cardiac: RRR  Respiratory: CTA anteriorly; no wheeze GI: Positive ascites MS: 1+ edema Neuro:  Moves all ext Psych: Not assessed  Labs    High Sensitivity Troponin:   Recent Labs  Lab 09/20/21 0912  TROPONINIHS 30*      Chemistry Recent Labs  Lab 09/20/21 0912 09/20/21 0934 09/21/21 0510 09/21/21 1750 09/22/21 0411  NA 122*   < > 126* 132* 133*  K 6.7*   < > 5.9* 4.8 4.4  CL 88*   < > 93* 96* 97*  CO2 20*   < > 17* 21* 21*  GLUCOSE 150*   < > 141* 107* 125*  BUN 49*   < > 49* 31* 25*  CREATININE 5.28*   < > 4.89* 3.54* 3.08*  CALCIUM 8.8*   < > 8.4* 8.2* 8.1*  MG 2.2  --  2.0  --  2.4  PROT 5.6*  --   --   --  4.8*  ALBUMIN 3.4*  --  2.8* 2.8* 2.8*  AST 20  --   --   --  25  ALT 24  --   --   --  28  ALKPHOS 134*  --   --   --  106  BILITOT 0.9  --   --   --  1.1  GFRNONAA 11*   < > 12* 18* 21*  ANIONGAP 14   < > 16* 15 15   < > = values in this interval not displayed.      Hematology Recent Labs  Lab 09/20/21 0912 09/20/21 0934 09/20/21 2050 09/21/21 0510 09/22/21 0411  WBC 12.4*  --   --  15.8* 13.0*  RBC 4.19*  --   --  4.49 4.34  HGB 13.1   < > 13.6 14.0 13.4  HCT 40.1   < > 40.0 40.4 40.5  MCV 95.7  --   --  90.0 93.3  MCH 31.3  --   --   31.2 30.9  MCHC 32.7  --   --  34.7 33.1  RDW 16.0*  --   --  15.5 15.9*  PLT 220  --   --  238 137*   < > = values in this interval not displayed.    Thyroid  Recent Labs  Lab 09/21/21 0510  TSH 5.556*     BNP Recent Labs  Lab 09/20/21 0912  BNP 1,376.7*       Radiology    ECHOCARDIOGRAM COMPLETE  Result Date: 09/21/2021    ECHOCARDIOGRAM REPORT   Patient Name:   Berman TRENTON PASSOW Date of Exam: 09/21/2021 Medical Rec #:  [id number]        Height:       70.0 in Accession #:    [id number]       Weight:       198.9 lb Date of Birth:  [birth year]       BSA:          2.082 m Patient Age:    66 years         BP:           106/50 mmHg Patient Gender: M                HR:           77 bpm. Exam Location:  Inpatient Procedure: 2D Echo and Strain Analysis Indications:    acute systolic chf  History:        Patient has prior history of Echocardiogram examinations, most                 recent 12/14/2020. End stage renal disease, Arrythmias:Atrial                 Fibrillation and V-Tach; Risk Factors:Diabetes and Hypertension.  Sonographer:    Johny Chess RDCS Referring Phys: [place]  1. Left ventricular ejection fraction, by estimation, is 55 to 60%. The left ventricle has normal function. The left ventricle has no regional wall  motion abnormalities. Left ventricular diastolic parameters are consistent with Grade II diastolic dysfunction (pseudonormalization). Elevated left atrial pressure. There is the interventricular septum is flattened in systole, consistent with right ventricular pressure overload. The average left ventricular global longitudinal strain is -18.9 %. The global longitudinal strain is normal.  2. Right ventricular systolic function is normal. The right ventricular size is mildly enlarged. There is moderately elevated pulmonary artery systolic pressure. The estimated right ventricular systolic pressure is 00.7 mmHg.  3. Left atrial size was moderately  dilated.  4. Right atrial size was moderately dilated.  5. The mitral valve is normal in structure. Mild mitral valve regurgitation.  6. Tricuspid valve regurgitation is mild to moderate.  7. The aortic valve is tricuspid. There is mild thickening of the aortic valve. Aortic valve regurgitation is not visualized. Aortic valve sclerosis is present, with no evidence of aortic valve stenosis.  8. Aortic dilatation noted. There is mild dilatation of the aortic root, measuring 42 mm.  9. The inferior vena cava is dilated in size with <50% respiratory variability, suggesting right atrial pressure of 15 mmHg. Comparison(s): No significant change from prior study. Prior images reviewed side by side. FINDINGS  Left Ventricle: Left ventricular ejection fraction, by estimation, is 55 to 60%. The left ventricle has normal function. The left ventricle has no regional wall motion abnormalities. The average left ventricular global longitudinal strain is -18.9 %. The global longitudinal strain is normal. The left ventricular internal cavity size was normal in size. There is borderline concentric left ventricular hypertrophy. The interventricular septum is flattened in systole, consistent with right ventricular pressure overload. Left ventricular diastolic parameters are consistent with Grade II diastolic dysfunction (pseudonormalization). Elevated left atrial pressure. Right Ventricle: The right ventricular size is mildly enlarged. No increase in right ventricular wall thickness. Right ventricular systolic function is normal. There is moderately elevated pulmonary artery systolic pressure. The tricuspid regurgitant velocity is 3.19 m/s, and with an assumed right atrial pressure of 15 mmHg, the estimated right ventricular systolic pressure is 62.2 mmHg. Left Atrium: Left atrial size was moderately dilated. Right Atrium: Right atrial size was moderately dilated. Pericardium: There is no evidence of pericardial effusion. Mitral Valve:  The mitral valve is normal in structure. Mild mitral annular calcification. Mild mitral valve regurgitation. Tricuspid Valve: The tricuspid valve is normal in structure. Tricuspid valve regurgitation is mild to moderate. Aortic Valve: The aortic valve is tricuspid. There is mild thickening of the aortic valve. Aortic valve regurgitation is not visualized. Aortic valve sclerosis is present, with no evidence of aortic valve stenosis. Pulmonic Valve: The pulmonic valve was grossly normal. Pulmonic valve regurgitation is not visualized. Aorta: Aortic dilatation noted. There is mild dilatation of the aortic root, measuring 42 mm. Venous: The inferior vena cava is dilated in size with less than 50% respiratory variability, suggesting right atrial pressure of 15 mmHg. IAS/Shunts: No atrial level shunt detected by color flow Doppler.  LEFT VENTRICLE PLAX 2D LVIDd:         5.50 cm   Diastology LVIDs:         4.00 cm   LV e' medial:    8.27 cm/s LV PW:         1.10 cm   LV E/e' medial:  15.2 LV IVS:        1.30 cm   LV e' lateral:   7.00 cm/s LVOT diam:     2.20 cm   LV E/e' lateral: 18.0 LV SV:  127 LV SV Index:   61        2D Longitudinal Strain LVOT Area:     3.80 cm  2D Strain GLS Avg:     -18.9 %  RIGHT VENTRICLE             IVC RV S prime:     17.10 cm/s  IVC diam: 2.70 cm TAPSE (M-mode): 2.2 cm LEFT ATRIUM             Index        RIGHT ATRIUM           Index LA diam:        4.10 cm 1.97 cm/m   RA Area:     25.60 cm LA Vol (A2C):   79.3 ml 38.08 ml/m  RA Volume:   78.40 ml  37.65 ml/m LA Vol (A4C):   87.7 ml 42.12 ml/m LA Biplane Vol: 85.9 ml 41.25 ml/m  AORTIC VALVE LVOT Vmax:   188.00 cm/s LVOT Vmean:  116.000 cm/s LVOT VTI:    0.333 m  AORTA Ao Root diam: 4.20 cm Ao Asc diam:  3.50 cm MITRAL VALVE                TRICUSPID VALVE MV Area (PHT): 2.22 cm     TR Peak grad:   40.7 mmHg MV Decel Time: 342 msec     TR Vmax:        319.00 cm/s MV E velocity: 126.00 cm/s MV A velocity: 132.00 cm/s  SHUNTS MV  E/A ratio:  0.95         Systemic VTI:  0.33 m                             Systemic Diam: 2.20 cm Dani Gobble Croitoru MD Electronically signed by Sanda Klein MD Signature Date/Time: 09/21/2021/10:55:39 AM    Final    DG Abd 1 View  Result Date: 09/21/2021 CLINICAL DATA:  OG tube placement. EXAM: ABDOMEN - 1 VIEW COMPARISON:  None Available. FINDINGS: Tip of the enteric tube is below the diaphragm in the stomach, the side port is in the region of the distal esophagus. Advancement of 4 cm is recommended for optimal placement. Nonobstructive bowel gas pattern in the upper abdomen. IMPRESSION: Tip of the enteric tube below the diaphragm in the stomach, side-port in the region of the distal esophagus. Advancement of 4 cm is recommended for optimal placement. Electronically Signed   By: Keith Rake M.D.   On: 09/21/2021 00:52   DG CHEST PORT 1 VIEW  Result Date: 09/20/2021 CLINICAL DATA:  Respiratory failure EXAM: PORTABLE CHEST 1 VIEW COMPARISON:  None Available. FINDINGS: Endotracheal tube 6.2 cm above the carina. Right internal jugular hemodialysis catheter tip seen within the superior cavoatrial junction. Cardiac size is enlarged, unchanged. Central pulmonary vascular engorgement is again seen in keeping with changes of pulmonary arterial hypertension. No superimposed overt pulmonary edema. No pneumothorax or pleural effusion. No acute bone abnormality. IMPRESSION: 1. Support lines and tubes in appropriate position. 2. Stable cardiomegaly. 3. Pulmonary arterial hypertension. Electronically Signed   By: Fidela Salisbury M.D.   On: 09/20/2021 21:59   DG Chest Port 1 View  Result Date: 09/20/2021 CLINICAL DATA:  Shortness of breath.  Tachycardia. EXAM: PORTABLE CHEST 1 VIEW COMPARISON:  Radiograph earlier today CT 623 FINDINGS: Right-sided dialysis catheter in place. Volumes are low. Stable cardiomegaly. Improvement in vascular congestion from earlier today.  Eventration of the posterior right hemidiaphragm. No  pneumothorax or large pleural effusion. No developing airspace disease. New rounded densities projecting over the supraclavicular regions and right axilla likely represent ice packs. IMPRESSION: 1. Low lung volumes without acute abnormality. Stable cardiomegaly. 2. Right-sided dialysis catheter in place. Electronically Signed   By: Keith Rake M.D.   On: 09/20/2021 19:48   DG Chest Portable 1 View  Result Date: 09/20/2021 CLINICAL DATA:  Dyspnea EXAM: PORTABLE CHEST 1 VIEW COMPARISON:  08/19/2021 FINDINGS: Right IJ approach central venous catheter remains in place. Stable cardiomegaly. Pulmonary vascular congestion. No overt edema. No focal airspace consolidation, pleural effusion, or pneumothorax. IMPRESSION: Cardiomegaly and pulmonary vascular congestion without overt edema. Electronically Signed   By: Davina Poke D.O.   On: 09/20/2021 09:48     Patient Profile     66 year old male with past medical history of hypertension, chronic diastolic/right-sided heart failure, end-stage renal disease dialysis dependent, cirrhosis with hepatocellular carcinoma status post liver transplant, diabetes mellitus, atrial fibrillation/flutter for evaluation of recurrent atrial flutter with rapid ventricular response. Transesophageal echocardiogram October 2022 showed normal LV function, D-shaped septum, moderate right ventricular enlargement, biatrial enlargement, moderate tricuspid regurgitation, trace aortic insufficiency.  Right heart catheterization October 2022 showed pulmonary capillary wedge pressure 17 with PA pressure 66/25.  Cardiac MRI November 2022 showed no anomalous pulmonary venous return. Recently he has had progressive increased weakness, increasing dyspnea, increasing bilateral lower extremity edema.  He went to dialysis today and his heart rate was elevated in the 130-140 range.  He was sent to the emergency room. He is found to be in atrial flutter and cardiology asked to  evaluate.  Assessment & Plan    1 paroxysmal atrial flutter-patient remains in sinus rhythm.  Will continue IV amiodarone and heparin.  As outlined previously he was felt not to be a long-term anticoagulation candidate given comorbidities, history of subarachnoid hemorrhage and poor prognosis.     2 right-sided heart failure/ascites-volume removal per dialysis.  Status post paracentesis.   3 chronic hypotension-continue midodrine.  Wean norepinephrine as tolerated.   4 end-stage renal disease-dialysis per nephrology.   5 hyperkalemia-resolved   6 history of liver transplant   For questions or updates, please contact [facility] Please consult [email or web address] for contact info under        Signed, Kirk Ruths, MD  09/22/2021, 7:50 AM

## 2021-09-22 NOTE — Progress Notes (Signed)
[facility] Progress Note  Subjective: - 2 L UF w/ CRRT yesterday. BP's soft, remains on moderate dosing of levo gtt  Vitals:   09/22/21 0845 09/22/21 0900 09/22/21 0911 09/22/21 0915  BP:  (!) 108/56    Pulse: 91 91  90  Resp: '20 19  17  '$ Temp: 99.3 F (37.4 C) 99.1 F (37.3 C)  99.1 F (37.3 C)  TempSrc:      SpO2: 99% 98% 98% 99%  Weight:      Height:        Exam: on vent , groggy awakens to voice  no jvd  throat ett in place  Chest cta bilat and lat  Cor reg no RG  Abd soft ntnd no mass, 2-3+ ascites, +bs  Ext diffuse 2-3+ pitting bilat LE/ hip edema  Neuro is nonfocal    RIJ TDC+intact    Home meds include - allopurinol, amiodarone, bumetanide '4mg'$  qd non hd days, insulin lispro, midodrine 53mg pre HD mwf, MVI, mycophenolate 1 gm bid, prns/ vits/ supps    OP HD: MWF [facility]  4h  400/800   84.5kg   2/2.5 bath  TDC RIJ   Hep 4000+ 25042mrun - last HD Sat (extra Rx), usual UF 3-5kg, comes off 1.5- 2.5 over - last Hb 12.5 - no esa, Fe or vdra - last pth 530, phos 7.6, Ca 8.7    CXR 8/7 - no edema, +vasc congestion   Assessment/ Plan: Atrial fib/ flutter w/ RVR - IV amio started, converted to NSR. Per pmd/ cards.  R HF/ severe cor pulmonale - w/ chronic ascites/ edema anasarca very difficult to control in OP setting given low systemic BP's. Will ^ UF to 100-  250 cc/hr as tolerated on CRRT.  Volume excess - 4kg over dry wt, cont UF w/ CRRT ^150-250 cc/hr.  ESRD - HD MWF (started Feb 2023). Started CRRT 8/7. Cont for now.  Chronic hypotension - on midodrine pre HD at home. Getting vasopressor support here w/ levophed gtt. Per CCM. IDDM - on insulin, per pmd Anemia esrd - Hb >11, no esa needs MBD ckd - CCa in range, phos slightly up. No vdra.  H/o liver transplant (2013) - on mycophenolate GOC - very poor prognosis. Would support transition to full hospice.  DNR           RoKelly Splinter/10/2021, 9:39 AM   Recent Labs  Lab 09/21/21 0510  09/21/21 1750 09/22/21 0411  HGB 14.0  --  13.4  ALBUMIN 2.8* 2.8* 2.8*  CALCIUM 8.4* 8.2* 8.1*  PHOS 6.5*  6.6* 4.7* 4.6  CREATININE 4.89* 3.54* 3.08*  K 5.9* 4.8 4.4   No results for input(s): "IRON", "TIBC", "FERRITIN" in the last 168 hours. Inpatient medications:  allopurinol  150 mg Oral q morning   Chlorhexidine Gluconate Cloth  6 each Topical Q0600   fentaNYL (SUBLIMAZE) injection  50 mcg Intravenous Once   insulin aspart  0-6 Units Subcutaneous TID WC   midazolam  2 mg Intravenous Once   mycophenolate  1,000 mg Oral BID   mouth rinse  15 mL Mouth Rinse Q2H   sodium chloride flush  3 mL Intravenous Q12H    sodium chloride     amiodarone Stopped (09/20/21 2110)   cefTRIAXone (ROCEPHIN)  IV Stopped (09/21/21 1915)   dexmedetomidine (PRECEDEX) IV infusion 0.6 mcg/kg/hr (09/22/21 0900)   fentaNYL infusion INTRAVENOUS 37.5 mcg/hr (09/22/21 0900)   heparin 1,550 Units/hr (09/22/21 0900)   norepinephrine (LEVOPHED) Adult  infusion 15 mcg/min (09/22/21 0900)   prismasol BGK 2/2.5 replacement solution 500 mL/hr at 09/22/21 0754   prismasol BGK 2/2.5 replacement solution 300 mL/hr at 09/21/21 0118   prismasol BGK 4/2.5     promethazine (PHENERGAN) injection (IM or IVPB)     acetaminophen **OR** acetaminophen, calcium carbonate (dosed in mg elemental calcium), camphor-menthol **AND** hydrOXYzine, docusate sodium, docusate sodium, feeding supplement (NEPRO CARB STEADY), fentaNYL, fentaNYL, heparin, hydrALAZINE, LORazepam, midazolam, ondansetron **OR** ondansetron (ZOFRAN) IV, mouth rinse, phenylephrine, polyethylene glycol, promethazine (PHENERGAN) injection (IM or IVPB), sodium chloride, sorbitol, zolpidem

## 2021-09-22 NOTE — Progress Notes (Addendum)
[facility] for Heparin Indication: atrial fibrillation  Allergies  Allergen Reactions   Iodinated Contrast Media Hives and Other (See Comments)    Allergy is NOT to all contrast - but patient is unsure of which particular one his allergy is to. Contrast dye used before liver transplant cause hives, not sure which dye this was but is able to use others   Jardiance [Empagliflozin] Dermatitis    developed blisters with the medication, blisters  stopped after he discontinued taking it. (About 6 months ago /~06/2020)   Metformin And Related Nausea And Vomiting   Other Nausea And Vomiting    Patient states that he is allergic to "some antibiotic" but is not sure of the name of it.   Tape Other (See Comments)    Tore skin   Rosiglitazone Nausea And Vomiting    GI effects and abdominal pain    Patient Measurements: Height: '5\' 10"'$  (177.8 cm) Weight: 88.4 kg (194 lb 14.2 oz) IBW/kg (Calculated) : 73 Heparin Dosing Weight: 93.3 kg  Vital Signs: Temp: 99.1 F (37.3 C) (08/09 0915) Temp Source: Bladder (08/09 0800) BP: 108/56 (08/09 0900) Pulse Rate: 90 (08/09 0915)  Labs: Recent Labs    09/20/21 0912 09/20/21 0934 09/20/21 2050 09/20/21 2050 09/20/21 2310 09/21/21 0510 09/21/21 1423 09/21/21 1750 09/21/21 2217 09/22/21 0411  HGB 13.1   < > 13.6  --   --  14.0  --   --   --  13.4  HCT 40.1   < > 40.0  --   --  40.4  --   --   --  40.5  PLT 220  --   --   --   --  238  --   --   --  137*  LABPROT  --   --   --   --  14.7  --   --   --   --   --   INR  --   --   --   --  1.2  --   --   --   --   --   HEPARINUNFRC  --   --   --    < >  --  0.15* 0.59  --  0.64 0.60  CREATININE 5.28*  --   --   --  6.06* 4.89*  --  3.54*  --  3.08*  TROPONINIHS 30*  --   --   --   --   --   --   --   --   --    < > = values in this interval not displayed.     Estimated Creatinine Clearance: 26.1 mL/min (A) (by C-G formula based on SCr of 3.08 mg/dL  (H)).  Assessment: 66 y.o. male admitted with VDRF, ESRD on CRRT and Afib. Pharmacy consulted to dose heparin for Afib. Not on AC PTA    Heparin level is therapeutic at 0.6, on 1550 units/hr. CBC is stable. No s/sx of bleeding or infusion issues.  Goal of Therapy:  Heparin level 0.3-0.7 units/ml Monitor platelets by anticoagulation protocol: Yes   Plan:  Continue heparin 1550 units/hr Monitor daily HL, CBC, and for s/sx of bleeding  F/u [facility] plan  Thank you for allowing pharmacy to participate in this patient's care.  Antonietta Jewel, PharmD, [place] Clinical Pharmacist  Phone: [phone] 09/22/2021 9:41 AM  Please check AMION for all [place] phone numbers After 10:00 PM, call [place]  [phone]  ADDENDUM RN noticed that urine is getting bloody - plan for CRRT to stop. Will recheck level later tonight to make sure doesn't run high.  Antonietta Jewel, PharmD, [place] Clinical Pharmacist

## 2021-09-22 NOTE — Progress Notes (Signed)
D/w CCM/ Dr Tamala Julian, CVP is low and pt's standing wt is close to dry wt, further UF w/ CRRT will not likely help given his HF is mostly R sided. Will dc CRRT for now and let vol stabilize/ come up a bit and hopefully can come down/ off pressors in the next 24-48 hrs. Will plan to shift to [facility] w/ the next HD on Friday (MWF schedule).   Kelly Splinter, MD 09/22/2021, 2:57 PM

## 2021-09-22 NOTE — Progress Notes (Signed)
NAME:  Gregory Tanner, MRN:  [id number], DOB:  [birth year], LOS: 2 ADMISSION DATE:  09/20/2021, CONSULTATION DATE:  09/20/21 REFERRING MD:  Lorin Mercy , CHIEF COMPLAINT:  sob, fatigue   History of Present Illness:  66 yo man with aflutter (recently dx on amio), ESRD on HD MWF started 03/2021 (just had fistula surgery in 9/92), diastolic CHF, pulmonary HTN, DM, s/p liver transplant 2013 (NASH cirrhosis), SAH after fall in feb, chronic ascites with peritonial catheter indwelling (drained daily), and chronic hypotension. Followed at [facility] for R heart failure with very high PA pressures and given a prognosis of 6 months about 5 months PTA.   He presented to [facility] ED 8/7 from HD where he was only able to receive one hour of tx due to tachycardia with rates up to 150. C/o fatigue x 2-3 days and mild chest pain. Upon arrival to the ED he was tachycardic in atrial flutter with rates 150-160. Treated with amio bolus and drip with improvement. Given CTX for SBP ppx. Had peritoneal fluid removed via indwelling cath 2.4L without improvement. While in ED the patient became more diaphoretic with worsening SOB and PCCM was consulted.  HR up to 200. Upon PCCM eval the patient was in extremis and required urgent intubation.   Pertinent  Medical History   has a past medical history of Ankylosis of lumbar spine (02/21/2014), Benign essential hypertension (10/26/2015), Chronic diastolic heart failure ([place]) (09/26/2016), Chronic pain associated with significant psychosocial dysfunction (06/17/2015), Degeneration of intervertebral disc of lumbar region (07/05/2013), ESRD (end stage renal disease) on dialysis ([place]) (03/27/2017), Hearing loss, Hepatocellular carcinoma ([place]) (12/27/2010), History of liver transplant ([place]) (07/20/2011), and Uncontrolled type 2 diabetes mellitus with insulin therapy (06/21/2018).  Bumex, allopurinol, amiodarone, insulin ss, tums, midodrine '10mg'$  pre dialysis, MVI, Cellcept '1000mg'$  qam and qhs, tart  cherry   Significant Hospital Events: Including procedures, antibiotic start and stop dates in addition to other pertinent events   8/7 admit started CRRT. 2.4L peritorneal fluid removed.   Interim History / Subjective:  Awake doing well on PS.  Objective   Blood pressure 108/67, pulse 93, temperature 99 F (37.2 C), temperature source Bladder, resp. rate 12, height '5\' 10"'$  (1.778 m), weight 88.4 kg, SpO2 99 %.    Vent Mode: PSV;CPAP FiO2 (%):  [40 %] 40 % Set Rate:  [24 bmp] 24 bmp Vt Set:  [580 mL-600 mL] 580 mL PEEP:  [5 cmH20] 5 cmH20 Pressure Support:  [10 cmH20] 10 cmH20 Plateau Pressure:  [18 cmH20-20 cmH20] 18 cmH20   Intake/Output Summary (Last 24 hours) at 09/22/2021 0902 Last data filed at 09/22/2021 0800 Gross per 24 hour  Intake 1512.88 ml  Output 4881 ml  Net -3368.12 ml    Filed Weights   09/20/21 0905 09/21/21 0500 09/22/21 0417  Weight: 98 kg 90.2 kg 88.4 kg    Examination: No distress Anasarca seems much better Writing down answers to questions Moves ext to command Lungs diminished bases Labs look good  Resolved Hospital Problem list     Assessment & Plan:  Shock: septic vs cardiogenic. Known end stage RV failure. Peritoneal fluid looks okay.  Echo actually looks pretty good. ESRD on HD Hyperkalemia Afib/flutter:  High output RV failure- follows at [facility].  Has been having tough time finding dry weight.  Hospice has been recommended. - NE for MAP goal 65 mmHg - CTX x 5 days for now - would be nice to have coox and CVP.  Will try to use dry  weight; he seems to be somewhere between 85-90kg as OP but every exam states he is fluid overloaded - NE going up fairly rapidly this AM, will go to neutral pull - Restart home PO amio - I think long term he can have AC, he had a traumatic small SAH back in Feb but nothing since; tolerating heparin drip fine here - Will stop CRRT briefly, get a CVP and coox - If run into wall with fluid removal may need  palliative to see  Acute respiratory failure with hypoxia - Will try to wean to extubate - Target sats 90%  Vasoplegia- continue PTA midodrine  Cirrhosis s/p liver transplant: - continue PTA tacro  Best Practice (right click and "Reselect all SmartList Selections" daily)   Diet/type: NPO DVT prophylaxis: systemic heparin , see discussion above RE: full dose AC GI prophylaxis: PPI Lines: Central line Foley:  N/A Code Status:  full code Last date of multidisciplinary goals of care discussion [he wants everything done]  45 min cc time Erskine Emery MD PCCM

## 2021-09-22 NOTE — TOC Initial Note (Signed)
Transition of Care [facility]) - Initial/Assessment Note    Patient Details  Name: Gregory Tanner MRN: [id number] Date of Birth: 07/29/1955  Transition of Care [facility]) CM/SW Contact:    Bethena Roys, RN Phone Number: 09/22/2021, 3:40 PM  Clinical Narrative: Risk for Readmission Assessment Completed. Patient was discussed during progression rounds. Patient presented for fatigue and SOB- patient was extubated today. CRRT has ended-nephrology following for iHD. Case Manager was able to speak with the patients spouse and the patient was previously active with [facility]. Case Manager did call [facility] to verify- awaiting call back. Patient has DME Cane, RW and oxygen in the home (purchased themselves) 2 Liters at home. Case Manager will continue to follow for additional transition of care  needs.                    Expected Discharge Plan: [place] Barriers to Discharge: Continued Medical Work up   Expected Discharge Plan and Services Expected Discharge Plan: [place] arrangements for the past 2 months: [place]    Prior Living Arrangements/Services Living arrangements for the past 2 months: Single Family Home Lives with:: Spouse Patient language and need for interpreter reviewed:: Yes        Need for Family Participation in Patient Care: Yes (Comment) Care giver support system in place?: Yes (comment)   Criminal Activity/Legal Involvement Pertinent to Current Situation/Hospitalization: No - Comment as needed    Alcohol / Substance Use: Not Applicable Psych Involvement: No (comment)  Admission diagnosis:  Hyperkalemia [E87.5] Ventricular tachycardia ([place]) [I47.20] Tachycardia [R00.0] Atrial fibrillation with RVR ([place]) [I48.91] Atrial flutter, unspecified type ([place]) [I48.92] Patient Active Problem List   Diagnosis Date Noted   Septic shock ([place])    Acute hypoxemic respiratory failure ([place])    Atrial  fibrillation with RVR ([place]) 09/20/2021   Ventricular tachycardia ([place]) 09/20/2021   SVT (supraventricular tachycardia) ([place]) 08/19/2021   Hypotension 08/19/2021   ESRD on dialysis ([place]) 07/13/2021   Acute renal failure superimposed on stage 3b chronic kidney disease ([place]) 04/05/2021   Uremia 04/05/2021   Acute respiratory distress 12/05/2020   Refractory ascites    Thrombocytopenia ([place]) 11/23/2020   Iron deficiency anemia 11/23/2020   Acute exacerbation of CHF (congestive heart failure) ([place]) 11/22/2020   PHT (pulmonary hypertension) ([place]) 01/19/2020   Swelling    Liver disease    Hypertension    Hearing loss    Diabetes mellitus without complication (HCC)    Hypokalemia    Acute on chronic diastolic CHF (congestive heart failure) ([place]) 07/24/2019   Uncontrolled type 2 diabetes mellitus with insulin therapy 06/21/2018   Chronic diastolic heart failure ([place]) 09/26/2016   Chronic pain disorder 10/28/2015   SOB (shortness of breath) 10/28/2015   Hypertensive heart disease with heart failure ([place]) 10/26/2015   Benign essential hypertension 10/26/2015   Chronic pain associated with significant psychosocial dysfunction 06/17/2015   Nerve root pain 06/17/2015   Ankylosis of lumbar spine 02/21/2014   Degeneration of intervertebral disc of lumbar region 07/05/2013   Lumbar radiculopathy 10/10/2012   Degenerative disc disease, lumbar 07/26/2012   Goals of care, counseling/discussion 10/10/2011   Excess weight 10/10/2011   History of liver transplant ([place]) 07/20/2011   Type 2 diabetes mellitus ([place]) 12/27/2010   Hepatocellular carcinoma ([place]) 12/27/2010   BP (high blood pressure) 12/27/2010   PCP:  Ronita Hipps, MD Pharmacy:   [facility]  [address], [place] [id number] [place] [address] Phone: [phone] Fax: [phone]  Readmission Risk Interventions    06/03/2021    9:04 AM 12/03/2020    1:43 PM  Readmission Risk Prevention Plan   Transportation Screening Complete Complete  PCP or Specialist Appt within 3-5 Days  Complete  HRI or [place]  Complete  Social Work Consult for [place] Planning/Counseling  Complete  Palliative Care Screening  Not Applicable  Medication Review Press photographer) Complete Complete  PCP or Specialist appointment within 3-5 days of discharge Complete   HRI or [place] Complete   SW Recovery Care/Counseling Consult Complete   [place] Not Applicable

## 2021-09-23 ENCOUNTER — Inpatient Hospital Stay (HOSPITAL_COMMUNITY): Payer: Medicare Other

## 2021-09-23 DIAGNOSIS — I4891 Unspecified atrial fibrillation: Secondary | ICD-10-CM | POA: Diagnosis not present

## 2021-09-23 DIAGNOSIS — I4892 Unspecified atrial flutter: Secondary | ICD-10-CM | POA: Diagnosis not present

## 2021-09-23 LAB — RENAL FUNCTION PANEL
Albumin: 2.6 g/dL — ABNORMAL LOW (ref 3.5–5.0)
Anion gap: 10 (ref 5–15)
BUN: 29 mg/dL — ABNORMAL HIGH (ref 8–23)
CO2: 21 mmol/L — ABNORMAL LOW (ref 22–32)
Calcium: 7.6 mg/dL — ABNORMAL LOW (ref 8.9–10.3)
Chloride: 97 mmol/L — ABNORMAL LOW (ref 98–111)
Creatinine, Ser: 3.67 mg/dL — ABNORMAL HIGH (ref 0.61–1.24)
GFR, Estimated: 17 mL/min — ABNORMAL LOW (ref >60)
Glucose, Bld: 135 mg/dL — ABNORMAL HIGH (ref 70–99)
Phosphorus: 5.8 mg/dL — ABNORMAL HIGH (ref 2.5–4.6)
Potassium: 4.8 mmol/L (ref 3.5–5.1)
Sodium: 128 mmol/L — ABNORMAL LOW (ref 135–145)

## 2021-09-23 LAB — GLUCOSE, CAPILLARY
Glucose-Capillary: 125 mg/dL — ABNORMAL HIGH (ref 70–99)
Glucose-Capillary: 87 mg/dL (ref 70–99)
Glucose-Capillary: 121 mg/dL — ABNORMAL HIGH (ref 70–99)
Glucose-Capillary: 154 mg/dL — ABNORMAL HIGH (ref 70–99)
Glucose-Capillary: 142 mg/dL — ABNORMAL HIGH (ref 70–99)

## 2021-09-23 LAB — CBC
HCT: 34.3 % — ABNORMAL LOW (ref 39.0–52.0)
Hemoglobin: 11 g/dL — ABNORMAL LOW (ref 13.0–17.0)
MCH: 31.3 pg (ref 26.0–34.0)
MCHC: 32.1 g/dL (ref 30.0–36.0)
MCV: 97.4 fL (ref 80.0–100.0)
Platelets: 117 10*3/uL — ABNORMAL LOW (ref 150–400)
RBC: 3.52 MIL/uL — ABNORMAL LOW (ref 4.22–5.81)
RDW: 16 % — ABNORMAL HIGH (ref 11.5–15.5)
WBC: 10.7 10*3/uL — ABNORMAL HIGH (ref 4.0–10.5)
nRBC: 0 % (ref 0.0–0.2)

## 2021-09-23 LAB — PATHOLOGIST SMEAR REVIEW

## 2021-09-23 LAB — HEPARIN LEVEL (UNFRACTIONATED): Heparin Unfractionated: 0.47 IU/mL (ref 0.30–0.70)

## 2021-09-23 MED ORDER — MIDODRINE HCL 5 MG PO TABS
10.0000 mg | ORAL_TABLET | Freq: Three times a day (TID) | ORAL | Status: DC
  Administered 2021-09-23 – 2021-09-24 (×3): 10 mg via ORAL
  Filled 2021-09-23 (×4): qty 2

## 2021-09-23 MED ORDER — HEPARIN SODIUM (PORCINE) 1000 UNIT/ML DIALYSIS
2000.0000 [IU] | INTRAMUSCULAR | Status: DC | PRN
  Administered 2021-09-24: 2000 [IU] via INTRAVENOUS_CENTRAL

## 2021-09-23 MED ORDER — ALBUMIN HUMAN 25 % IV SOLN
25.0000 g | Freq: Four times a day (QID) | INTRAVENOUS | Status: AC
  Administered 2021-09-23 – 2021-09-24 (×4): 25 g via INTRAVENOUS
  Filled 2021-09-23 (×4): qty 100

## 2021-09-23 MED ORDER — CHLORHEXIDINE GLUCONATE CLOTH 2 % EX PADS
6.0000 | MEDICATED_PAD | Freq: Every day | CUTANEOUS | Status: DC
  Administered 2021-09-23: 6 via TOPICAL

## 2021-09-23 NOTE — Progress Notes (Signed)
NAME:  Gregory Tanner, MRN:  [id number], DOB:  12/20/1955, LOS: 3 ADMISSION DATE:  09/20/2021, CONSULTATION DATE:  09/20/21 REFERRING MD:  Lorin Mercy , CHIEF COMPLAINT:  sob, fatigue   History of Present Illness:  66 yo man with aflutter (recently dx on amio), ESRD on HD MWF started 03/2021 (just had fistula surgery in 0/27), diastolic CHF, pulmonary HTN, DM, s/p liver transplant 2013 (NASH cirrhosis), SAH after fall in feb, chronic ascites with peritonial catheter indwelling (drained daily), and chronic hypotension. Followed at [facility] for R heart failure with very high PA pressures and given a prognosis of 6 months about 5 months PTA.   He presented to [facility] ED 8/7 from HD where he was only able to receive one hour of tx due to tachycardia with rates up to 150. C/o fatigue x 2-3 days and mild chest pain. Upon arrival to the ED he was tachycardic in atrial flutter with rates 150-160. Treated with amio bolus and drip with improvement. Given CTX for SBP ppx. Had peritoneal fluid removed via indwelling cath 2.4L without improvement. While in ED the patient became more diaphoretic with worsening SOB and PCCM was consulted.  HR up to 200. Upon PCCM eval the patient was in extremis and required urgent intubation.   Pertinent  Medical History   has a past medical history of Ankylosis of lumbar spine (02/21/2014), Benign essential hypertension (10/26/2015), Chronic diastolic heart failure ([employer]) (09/26/2016), Chronic pain associated with significant psychosocial dysfunction (06/17/2015), Degeneration of intervertebral disc of lumbar region (07/05/2013), ESRD (end stage renal disease) on dialysis ([place]) (03/27/2017), Hearing loss, Hepatocellular carcinoma ([place]) (12/27/2010), History of liver transplant ([place]) (07/20/2011), and Uncontrolled type 2 diabetes mellitus with insulin therapy (06/21/2018).  Bumex, allopurinol, amiodarone, insulin ss, tums, midodrine '10mg'$  pre dialysis, MVI, Cellcept '1000mg'$  qam and qhs, tart  cherry   Significant Hospital Events: Including procedures, antibiotic start and stop dates in addition to other pertinent events   8/7 admit started CRRT. 2.4L peritorneal fluid removed.   Interim History / Subjective:  Awake, extubated, feels somewhat short of breath  Objective   Blood pressure 127/72, pulse 87, temperature 98.6 F (37 C), temperature source Oral, resp. rate (!) 21, height '5\' 10"'$  (1.778 m), weight 86.6 kg, SpO2 99 %. CVP:  [6 mmHg] 6 mmHg      Intake/Output Summary (Last 24 hours) at 09/23/2021 0818 Last data filed at 09/23/2021 0700 Gross per 24 hour  Intake 1071.62 ml  Output 402 ml  Net 669.62 ml    Filed Weights   09/22/21 0417 09/22/21 1200 09/23/21 0500  Weight: 88.4 kg 84.4 kg 86.6 kg    General:  chronically ill-appearing M, awake, sitting up in no distress HEENT: MM pink/moist, sclera anicteric  Neuro: awake, alert, oriented and following commands CV: s1s2 rrr, no m/r/g PULM:  no significant rhonchi or wheezing, no accessory muscle use, comfortable on [place] GI: soft, abdomen distended, non-tender Extremities: warm/dry, no edema  Skin: no rashes or lesions   Labs reviewed  WBC 10 Platelets 117 K 4.4  Resolved Hospital Problem list     Assessment & Plan:    Shock septic vs cardiogenic.  Known end stage RV failure. Peritoneal fluid and echo looked okay. Suspect cardiogenic, echo with preserved EF and grade II diastolic dysfunction -blood cultures with NGTD -Procal 0.3>1.4>2.45 -WBC down-trending at 10k -afebrile last 24hrs -continue empiric ceftriaxone x5 days -midodrine increased by nephrology  ESRD on HD Hyperkalemia Afib/flutter:  High output RV failure- follows at [facility].  Hospice has been recommended.  CVP 6 yesterday so CRRT discontinued - NE for MAP goal 65 mmHg - CTX x 5 days for now - outpatient dry weight was difficult to determine, coox yesterday 83% -remains on Levophed - Restart home PO amio - likely can have AC, he  had a traumatic small SAH back in Feb but nothing since; tolerating heparin drip fine here -nephrology following, plan for iHD tomorrow, pt has indwelling peritoneal drain, feels that his peritoneal fluid level is high, will place orders for him to drain himself as he does at thome  Acute respiratory failure with hypoxia Improving -extubated to [place] yesterday   Cirrhosis s/p liver transplant: - continue PTA tacro and peritoneal drainage  Thrombocytopenia Stable at 117 likely secondary to critical illness, continue to follow  Best Practice (right click and "Reselect all SmartList Selections" daily)   Diet/type: NPO DVT prophylaxis: systemic heparin , see discussion above RE: full dose AC GI prophylaxis: PPI Lines: Central line Foley:  N/A Code Status:  full code Last date of multidisciplinary goals of care discussion [pt wants full code and scope of care]  CRITICAL CARE Performed by: Otilio Carpen Brindle Leyba   Total critical care time: 42 minutes  Critical care time was exclusive of separately billable procedures and treating other patients.  Critical care was necessary to treat or prevent imminent or life-threatening deterioration.  Critical care was time spent personally by me on the following activities: development of treatment plan with patient and/or surrogate as well as nursing, discussions with consultants, evaluation of patient's response to treatment, examination of patient, obtaining history from patient or surrogate, ordering and performing treatments and interventions, ordering and review of laboratory studies, ordering and review of radiographic studies, pulse oximetry and re-evaluation of patient's condition.   Otilio Carpen Caprina Wussow, PA-C [facility] See Amion for pager If no response to pager , please call [phone] until 7pm After 7:00 pm call [place]  [phone]

## 2021-09-23 NOTE — Progress Notes (Signed)
Progress Note  Patient Name: Gregory Tanner Date of Encounter: 09/23/2021  [facility] Cardiologist: Shirlee More, MD   Subjective   Extubated; no CP or dyspnea  Inpatient Medications    Scheduled Meds:  allopurinol  150 mg Oral q morning   amiodarone  200 mg Oral Daily   Chlorhexidine Gluconate Cloth  6 each Topical Q0600   fentaNYL (SUBLIMAZE) injection  50 mcg Intravenous Once   insulin aspart  0-6 Units Subcutaneous TID WC   midazolam  2 mg Intravenous Once   midodrine  5 mg Oral TID WC   mycophenolate  1,000 mg Oral BID   sodium chloride flush  3 mL Intravenous Q12H   Continuous Infusions:  sodium chloride     cefTRIAXone (ROCEPHIN)  IV Stopped (09/22/21 1806)   dexmedetomidine (PRECEDEX) IV infusion Stopped (09/22/21 0900)   heparin 1,450 Units/hr (09/23/21 0700)   norepinephrine (LEVOPHED) Adult infusion 17 mcg/min (09/23/21 0700)   promethazine (PHENERGAN) injection (IM or IVPB)     PRN Meds: acetaminophen **OR** acetaminophen, calcium carbonate (dosed in mg elemental calcium), camphor-menthol **AND** hydrOXYzine, docusate sodium, docusate sodium, feeding supplement (NEPRO CARB STEADY), fentaNYL, fentaNYL, hydrALAZINE, LORazepam, midazolam, ondansetron **OR** ondansetron (ZOFRAN) IV, mouth rinse, phenylephrine, polyethylene glycol, polyvinyl alcohol, promethazine (PHENERGAN) injection (IM or IVPB), sorbitol, zolpidem   Vital Signs    Vitals:   09/23/21 0545 09/23/21 0600 09/23/21 0615 09/23/21 0700  BP:  (!) 127/49  127/72  Pulse: 77 84 85 87  Resp: (!) '24 18 18 '$ (!) 21  Temp:      TempSrc:      SpO2: 98% 99% 98% 99%  Weight:      Height:        Intake/Output Summary (Last 24 hours) at 09/23/2021 0727 Last data filed at 09/23/2021 0700 Gross per 24 hour  Intake 1165.75 ml  Output 647 ml  Net 518.75 ml       09/23/2021    5:00 AM 09/22/2021   12:00 PM 09/22/2021    4:17 AM  Last 3 Weights  Weight (lbs) 190 lb 14.7 oz 186 lb 1.1 oz 194 lb 14.2 oz   Weight (kg) 86.6 kg 84.4 kg 88.4 kg      Telemetry    Sinus - Personally Reviewed   Physical Exam   GEN: WD chronically ill appearing Neck: Supple Cardiac: RRR, no gallop Respiratory: CTA anteriorly; no rhonchi GI: Positive ascites; cannot palpate masses MS: 1+ hip edema Neuro:  No focal findings Psych: Normal affect  Labs    High Sensitivity Troponin:   Recent Labs  Lab 09/20/21 0912  TROPONINIHS 30*      Chemistry Recent Labs  Lab 09/20/21 0912 09/20/21 0934 09/21/21 0510 09/21/21 1750 09/22/21 0411  NA 122*   < > 126* 132* 133*  K 6.7*   < > 5.9* 4.8 4.4  CL 88*   < > 93* 96* 97*  CO2 20*   < > 17* 21* 21*  GLUCOSE 150*   < > 141* 107* 125*  BUN 49*   < > 49* 31* 25*  CREATININE 5.28*   < > 4.89* 3.54* 3.08*  CALCIUM 8.8*   < > 8.4* 8.2* 8.1*  MG 2.2  --  2.0  --  2.4  PROT 5.6*  --   --   --  4.8*  ALBUMIN 3.4*  --  2.8* 2.8* 2.8*  AST 20  --   --   --  25  ALT 24  --   --   --  28  ALKPHOS 134*  --   --   --  106  BILITOT 0.9  --   --   --  1.1  GFRNONAA 11*   < > 12* 18* 21*  ANIONGAP 14   < > 16* 15 15   < > = values in this interval not displayed.      Hematology Recent Labs  Lab 09/21/21 0510 09/22/21 0411 09/23/21 0457  WBC 15.8* 13.0* 10.7*  RBC 4.49 4.34 3.52*  HGB 14.0 13.4 11.0*  HCT 40.4 40.5 34.3*  MCV 90.0 93.3 97.4  MCH 31.2 30.9 31.3  MCHC 34.7 33.1 32.1  RDW 15.5 15.9* 16.0*  PLT 238 137* 117*    Thyroid  Recent Labs  Lab 09/21/21 0510  TSH 5.556*     BNP Recent Labs  Lab 09/20/21 0912  BNP 1,376.7*       Radiology    DG Chest Port 1 View  Result Date: 09/22/2021 CLINICAL DATA:  Respiratory failure. EXAM: PORTABLE CHEST 1 VIEW COMPARISON:  Chest x-ray from yesterday. FINDINGS: Enteric tube entering the stomach with the tip below the field of view. Unchanged endotracheal tube and tunneled right internal jugular dialysis catheter. Unchanged mild cardiomegaly and pulmonary vascular congestion. Unchanged  mild left basilar atelectasis. No pleural effusion or pneumothorax. No acute osseous abnormality. IMPRESSION: 1. Stable lines and tubes. 2. Unchanged mild cardiomegaly and pulmonary vascular congestion. Electronically Signed   By: Titus Dubin M.D.   On: 09/22/2021 08:23   ECHOCARDIOGRAM COMPLETE  Result Date: 09/21/2021    ECHOCARDIOGRAM REPORT   Patient Name:   Gregory Tanner Date of Exam: 09/21/2021 Medical Rec #:  [id number]        Height:       70.0 in Accession #:    [id number]       Weight:       198.9 lb Date of Birth:  04/23/1955       BSA:          2.082 m Patient Age:    66 years         BP:           106/50 mmHg Patient Gender: M                HR:           77 bpm. Exam Location:  Inpatient Procedure: 2D Echo and Strain Analysis Indications:    acute systolic chf  History:        Patient has prior history of Echocardiogram examinations, most                 recent 12/14/2020. End stage renal disease, Arrythmias:Atrial                 Fibrillation and V-Tach; Risk Factors:Diabetes and Hypertension.  Sonographer:    Johny Chess RDCS Referring Phys: [place]  1. Left ventricular ejection fraction, by estimation, is 55 to 60%. The left ventricle has normal function. The left ventricle has no regional wall motion abnormalities. Left ventricular diastolic parameters are consistent with Grade II diastolic dysfunction (pseudonormalization). Elevated left atrial pressure. There is the interventricular septum is flattened in systole, consistent with right ventricular pressure overload. The average left ventricular global longitudinal strain is -18.9 %. The global longitudinal strain is normal.  2. Right ventricular systolic function is normal. The right ventricular size is mildly enlarged. There is moderately elevated pulmonary artery systolic pressure. The estimated  right ventricular systolic pressure is 81.8 mmHg.  3. Left atrial size was moderately dilated.  4. Right  atrial size was moderately dilated.  5. The mitral valve is normal in structure. Mild mitral valve regurgitation.  6. Tricuspid valve regurgitation is mild to moderate.  7. The aortic valve is tricuspid. There is mild thickening of the aortic valve. Aortic valve regurgitation is not visualized. Aortic valve sclerosis is present, with no evidence of aortic valve stenosis.  8. Aortic dilatation noted. There is mild dilatation of the aortic root, measuring 42 mm.  9. The inferior vena cava is dilated in size with <50% respiratory variability, suggesting right atrial pressure of 15 mmHg. Comparison(s): No significant change from prior study. Prior images reviewed side by side. FINDINGS  Left Ventricle: Left ventricular ejection fraction, by estimation, is 55 to 60%. The left ventricle has normal function. The left ventricle has no regional wall motion abnormalities. The average left ventricular global longitudinal strain is -18.9 %. The global longitudinal strain is normal. The left ventricular internal cavity size was normal in size. There is borderline concentric left ventricular hypertrophy. The interventricular septum is flattened in systole, consistent with right ventricular pressure overload. Left ventricular diastolic parameters are consistent with Grade II diastolic dysfunction (pseudonormalization). Elevated left atrial pressure. Right Ventricle: The right ventricular size is mildly enlarged. No increase in right ventricular wall thickness. Right ventricular systolic function is normal. There is moderately elevated pulmonary artery systolic pressure. The tricuspid regurgitant velocity is 3.19 m/s, and with an assumed right atrial pressure of 15 mmHg, the estimated right ventricular systolic pressure is 29.9 mmHg. Left Atrium: Left atrial size was moderately dilated. Right Atrium: Right atrial size was moderately dilated. Pericardium: There is no evidence of pericardial effusion. Mitral Valve: The mitral valve  is normal in structure. Mild mitral annular calcification. Mild mitral valve regurgitation. Tricuspid Valve: The tricuspid valve is normal in structure. Tricuspid valve regurgitation is mild to moderate. Aortic Valve: The aortic valve is tricuspid. There is mild thickening of the aortic valve. Aortic valve regurgitation is not visualized. Aortic valve sclerosis is present, with no evidence of aortic valve stenosis. Pulmonic Valve: The pulmonic valve was grossly normal. Pulmonic valve regurgitation is not visualized. Aorta: Aortic dilatation noted. There is mild dilatation of the aortic root, measuring 42 mm. Venous: The inferior vena cava is dilated in size with less than 50% respiratory variability, suggesting right atrial pressure of 15 mmHg. IAS/Shunts: No atrial level shunt detected by color flow Doppler.  LEFT VENTRICLE PLAX 2D LVIDd:         5.50 cm   Diastology LVIDs:         4.00 cm   LV e' medial:    8.27 cm/s LV PW:         1.10 cm   LV E/e' medial:  15.2 LV IVS:        1.30 cm   LV e' lateral:   7.00 cm/s LVOT diam:     2.20 cm   LV E/e' lateral: 18.0 LV SV:         127 LV SV Index:   61        2D Longitudinal Strain LVOT Area:     3.80 cm  2D Strain GLS Avg:     -18.9 %  RIGHT VENTRICLE             IVC RV S prime:     17.10 cm/s  IVC diam: 2.70 cm TAPSE (M-mode): 2.2  cm LEFT ATRIUM             Index        RIGHT ATRIUM           Index LA diam:        4.10 cm 1.97 cm/m   RA Area:     25.60 cm LA Vol (A2C):   79.3 ml 38.08 ml/m  RA Volume:   78.40 ml  37.65 ml/m LA Vol (A4C):   87.7 ml 42.12 ml/m LA Biplane Vol: 85.9 ml 41.25 ml/m  AORTIC VALVE LVOT Vmax:   188.00 cm/s LVOT Vmean:  116.000 cm/s LVOT VTI:    0.333 m  AORTA Ao Root diam: 4.20 cm Ao Asc diam:  3.50 cm MITRAL VALVE                TRICUSPID VALVE MV Area (PHT): 2.22 cm     TR Peak grad:   40.7 mmHg MV Decel Time: 342 msec     TR Vmax:        319.00 cm/s MV E velocity: 126.00 cm/s MV A velocity: 132.00 cm/s  SHUNTS MV E/A ratio:  0.95          Systemic VTI:  0.33 m                             Systemic Diam: 2.20 cm Mihai Croitoru MD Electronically signed by Sanda Klein MD Signature Date/Time: 09/21/2021/10:55:39 AM    Final      Patient Profile     66 year old male with past medical history of hypertension, chronic diastolic/right-sided heart failure, end-stage renal disease dialysis dependent, cirrhosis with hepatocellular carcinoma status post liver transplant, diabetes mellitus, atrial fibrillation/flutter for evaluation of recurrent atrial flutter with rapid ventricular response. Transesophageal echocardiogram October 2022 showed normal LV function, D-shaped septum, moderate right ventricular enlargement, biatrial enlargement, moderate tricuspid regurgitation, trace aortic insufficiency.  Right heart catheterization October 2022 showed pulmonary capillary wedge pressure 17 with PA pressure 66/25.  Cardiac MRI November 2022 showed no anomalous pulmonary venous return. Recently he has had progressive increased weakness, increasing dyspnea, increasing bilateral lower extremity edema.  He went to dialysis today and his heart rate was elevated in the 130-140 range.  He was sent to the emergency room. He is found to be in atrial flutter and cardiology asked to evaluate.  Assessment & Plan    1 paroxysmal atrial flutter-patient remains in sinus rhythm.  Continue amiodarone at present dose.  Previously felt not to be an anticoagulation candidate.   2 right-sided heart failure/ascites-volume removal per dialysis.  Much improved.   3 chronic hypotension-continue midodrine.  Wean norepinephrine as tolerated.   4 end-stage renal disease-dialysis per nephrology.   5 history of liver transplant   For questions or updates, please contact [place] Please consult [email or web address] for contact info under        Signed, Kirk Ruths, MD  09/23/2021, 7:27 AM

## 2021-09-23 NOTE — TOC Progression Note (Signed)
Transition of Care [facility]) - Progression Note    Patient Details  Name: Gregory Tanner MRN: [id number] Date of Birth: Nov 03, 1955  Transition of Care [facility]) CM/SW Contact  Graves-Bigelow, Ocie Cornfield, RN Phone Number: 09/23/2021, 12:09 PM  Clinical Narrative: Case Manager spoke with [facility] and the patient is currently active with the agency. Patient will need HH RN for PleurX Drain- [facility] willing to continue services. Case Manager reached out to Staff RN and the unit secretary will order a box of ten PleurX drains for the patient. Case Manager spoke to PA and she will place orders with drainage schedule for the [facility] RN. No further needs identified at this time.      Expected Discharge Plan: [place] Barriers to Discharge: Continued Medical Work up  Expected Discharge Plan and Services Expected Discharge Plan: [place] In-house Referral: NA Discharge Planning Services: CM Consult Post Acute Care Choice: Home Health, Resumption of Svcs/PTA Provider Living arrangements for the past 2 months: Single Family Home                           HH Arranged: RN [facility] Agency: [place] Date [place]: 09/23/21 Time [place]: 1209 Representative spoke with at [place]: [place] ([place]) Interventions    Readmission Risk Interventions    09/22/2021    3:49 PM 06/03/2021    9:04 AM 12/03/2020    1:43 PM  Readmission Risk Prevention Plan  Transportation Screening Complete Complete Complete  PCP or Specialist Appt within 3-5 Days   Complete  HRI or [place]   Complete  Social Work Consult for [place] Planning/Counseling   Complete  Palliative Care Screening   Not Applicable  Medication Review Press photographer) Complete Complete Complete  PCP or Specialist appointment within 3-5 days of discharge Complete Complete   HRI or [place] Complete Complete   SW  Recovery Care/Counseling Consult Complete Complete   Palliative Care Screening Not Applicable Not [place] Not Applicable Not Applicable

## 2021-09-23 NOTE — Progress Notes (Signed)
Gregory Tanner for Heparin Indication: atrial fibrillation  Allergies  Allergen Reactions   Iodinated Contrast Media Hives and Other (See Comments)    Allergy is NOT to all contrast - but patient is unsure of which particular one his allergy is to. Contrast dye used before liver transplant cause hives, not sure which dye this was but is able to use others   Jardiance [Empagliflozin] Dermatitis    developed blisters with the medication, blisters  stopped after he discontinued taking it. (About 6 months ago /~06/2020)   Metformin And Related Nausea And Vomiting   Other Nausea And Vomiting    Patient states that he is allergic to "some antibiotic" but is not sure of the name of it.   Tape Other (See Comments)    Tore skin   Rosiglitazone Nausea And Vomiting    GI effects and abdominal pain    Patient Measurements: Height: '5\' 10"'$  (177.8 cm) Weight: 86.6 kg (190 lb 14.7 oz) IBW/kg (Calculated) : 73 Heparin Dosing Weight: 93.3 kg  Vital Signs: Temp: 98.6 F (37 C) (08/10 0800) Temp Source: Oral (08/10 0800) BP: 137/79 (08/10 0800) Pulse Rate: 88 (08/10 0800)  Labs: Recent Labs    09/20/21 2310 09/21/21 0510 09/21/21 1423 09/21/21 1750 09/21/21 2217 09/22/21 0411 09/22/21 1946 09/23/21 0457 09/23/21 0818  HGB  --  14.0  --   --   --  13.4  --  11.0*  --   HCT  --  40.4  --   --   --  40.5  --  34.3*  --   PLT  --  238  --   --   --  137*  --  117*  --   LABPROT 14.7  --   --   --   --   --   --   --   --   INR 1.2  --   --   --   --   --   --   --   --   HEPARINUNFRC  --  0.15*   < >  --    < > 0.60 0.75* 0.47  --   CREATININE 6.06* 4.89*  --  3.54*  --  3.08*  --   --  3.67*   < > = values in this interval not displayed.     Estimated Creatinine Clearance: 20.2 mL/min (A) (by C-G formula based on SCr of 3.67 mg/dL (H)).  Assessment: 66 y.o. male admitted with VDRF, ESRD on CRRT and Afib. Pharmacy consulted to dose heparin for Afib.  Not on AC PTA    Heparin level came back therapeutic at 0.47, on 1450 units/hr. Hgb 11, plt 117. No s/sx of bleeding or infusion issues.   Goal of Therapy:  Heparin level 0.3-0.7 units/ml Monitor platelets by anticoagulation protocol: Yes   Plan:  Continue heparin infusion at 1450 units/hr Monitor daily HL, CBC, and for s/sx of bleeding  F/u [facility] plan  Antonietta Jewel, PharmD, [place] Pharmacist  Phone: [phone] 09/23/2021 10:01 AM  Please check AMION for all [place] phone numbers After 10:00 PM, call [place] [phone]

## 2021-09-23 NOTE — Progress Notes (Addendum)
[facility] Progress Note  Subjective: net neg 1 L yest. CRRT dc'd early afternoon yesterday. Pt extubated. RFP pending.   Vitals:   09/23/21 0545 09/23/21 0600 09/23/21 0615 09/23/21 0700  BP:  (!) 127/49  127/72  Pulse: 77 84 85 87  Resp: (!) '24 18 18 '$ (!) 21  Temp:      TempSrc:      SpO2: 98% 99% 98% 99%  Weight:      Height:        Exam:  Alert, no distress  no jvd  Chest cta bilat and lat  Cor reg no RG  Abd soft ntnd no mass, 2+ ascites, +bs  Ext diffuse 2+ pitting bilat LE/ hip edema  Neuro is nonfocal    RIJ TDC+intact    Home meds include - allopurinol, amiodarone, bumetanide '4mg'$  qd non hd days, insulin lispro, midodrine 69mg pre HD mwf, MVI, mycophenolate 1 gm bid, prns/ vits/ supps    OP HD: MWF [place]  4h  400/800   84.5kg   2/2.5 bath  TDC RIJ   Hep 4000+ 2509mrun - last Hep B labs here: 8/7 - last HD Sat (extra Rx), usual UF 3-5kg, comes off 1.5- 2.5 over - last Hb 12.5 - no esa, Fe or vdra - last pth 530, phos 7.6, Ca 8.7     CXR 8/7 - no edema, +vasc congestion   Assessment/ Plan: Atrial fib/ flutter w/ RVR - IV amio started, converted to NSR. Per pmd/ cards.  Shock - cardiogenic, remains on 17 ug/min of levophed. Will ^po midodrine to 10 mg tid, may need to escalate further.  R HF/ severe cor pulmonale - w/ chronic ascites/ edema anasarca which is difficult to control in OP setting given low systemic BP's.  Volume excess - 1-2kg up, CVP 6 yesterday so CRRT dc'd. Given that vol overload is mostly R sided will not be able to pull it all off w/o hemodynamic side effects.  ESRD - HD MWF (started Feb 2023). SP CRRT 8/7- 8/9. Will plan on iHD tomorrow in ICU to get back on schedule.  Chronic hypotension - see midodrine dosing as above IDDM - on insulin, per pmd Anemia esrd - Hb >11, no esa needs MBD ckd - CCa in range, phos slightly up. No vdra.  H/o liver transplant (2013) - on mycophenolate GOC - palliative team is following            Rob Kaitlyn Franko 09/23/2021, 8:05 AM   Recent Labs  Lab 09/21/21 1750 09/22/21 0411 09/23/21 0457  HGB  --  13.4 11.0*  ALBUMIN 2.8* 2.8*  --   CALCIUM 8.2* 8.1*  --   PHOS 4.7* 4.6  --   CREATININE 3.54* 3.08*  --   K 4.8 4.4  --     No results for input(s): "IRON", "TIBC", "FERRITIN" in the last 168 hours. Inpatient medications:  allopurinol  150 mg Oral q morning   amiodarone  200 mg Oral Daily   Chlorhexidine Gluconate Cloth  6 each Topical Q0600   fentaNYL (SUBLIMAZE) injection  50 mcg Intravenous Once   insulin aspart  0-6 Units Subcutaneous TID WC   midazolam  2 mg Intravenous Once   midodrine  5 mg Oral TID WC   mycophenolate  1,000 mg Oral BID   sodium chloride flush  3 mL Intravenous Q12H    sodium chloride     cefTRIAXone (ROCEPHIN)  IV Stopped (09/22/21 1806)   dexmedetomidine (PRECEDEX) IV infusion  Stopped (09/22/21 0900)   heparin 1,450 Units/hr (09/23/21 0700)   norepinephrine (LEVOPHED) Adult infusion 17 mcg/min (09/23/21 0700)   promethazine (PHENERGAN) injection (IM or IVPB)     acetaminophen **OR** acetaminophen, calcium carbonate (dosed in mg elemental calcium), camphor-menthol **AND** hydrOXYzine, docusate sodium, docusate sodium, feeding supplement (NEPRO CARB STEADY), fentaNYL, fentaNYL, hydrALAZINE, LORazepam, midazolam, ondansetron **OR** ondansetron (ZOFRAN) IV, mouth rinse, phenylephrine, polyethylene glycol, polyvinyl alcohol, promethazine (PHENERGAN) injection (IM or IVPB), sorbitol, zolpidem

## 2021-09-23 NOTE — Evaluation (Signed)
Physical Therapy Evaluation Patient Details Name: Gregory Tanner MRN: [id number] DOB: October 22, 1955 Today's Date: 09/23/2021  History of Present Illness  66 yo male admitted 8/7 from dialysis center with tachycardia. Pt with Afib with Rt heart failure. Intubated and on CRRT 8/7-8/9. PMhx: ESRD on HD MWF, CHF, chronic hypotension, hepatocellular carcinoma, NASH cirrhosis s/p liver transplant, IDDM, chronic ascites with pleur X perfoming daily paracentesis at home, Afib/flutter  Clinical Impression  Pt pleasant and states limited tolerance for activity at baseline due to SOB with fluid accumulation. Pt today with left fem art line with inspection prior to and post therapy with line secure and intact. Pt with reliance on spouse for bathing and dressing at baseline and reports prior HHPT which he does not wish to repeat but agreeable to acute therapy. PT with decreased strength, transfers and function who will benefit from acute therapy to maximize mobility, safety and function.   Hr 79 SpO2 98% on 4L at rest BP 127/92 pre gait, 114/62 in chair       Recommendations for follow up therapy are one component of a multi-disciplinary discharge planning process, led by the attending physician.  Recommendations may be updated based on patient status, additional functional criteria and insurance authorization.  Follow Up Recommendations No PT follow up (pt denied HHPT)      Assistance Recommended at Discharge Intermittent Supervision/Assistance  Patient can return home with the following  A little help with walking and/or transfers;A little help with bathing/dressing/bathroom;Assistance with cooking/housework;Assist for transportation;Direct supervision/assist for medications management    Equipment Recommendations None recommended by PT  Recommendations for Other Services       Functional Status Assessment Patient has had a recent decline in their functional status and demonstrates the ability to  make significant improvements in function in a reasonable and predictable amount of time.     Precautions / Restrictions Precautions Precautions: Fall Precaution Comments: fem art line Restrictions Weight Bearing Restrictions: No      Mobility  Bed Mobility Overal bed mobility: Needs Assistance Bed Mobility: Supine to Sit     Supine to sit: Min assist, HOB elevated     General bed mobility comments: HOB 30 degrees, assist to lift trunk and pivot to EOB with cues for hand placement and assist of pad to pivot    Transfers Overall transfer level: Needs assistance   Transfers: Sit to/from Stand, Bed to chair/wheelchair/BSC Sit to Stand: Min assist Stand pivot transfers: Min assist         General transfer comment: stand with min assist to rise with Rw present, pivot with RW bed to BSC, BSC to recliner    Ambulation/Gait Ambulation/Gait assistance: Min guard Gait Distance (Feet): 100 Feet Assistive device: Rolling walker (2 wheels) Gait Pattern/deviations: Step-through pattern, Decreased stride length   Gait velocity interpretation: 1.31 - 2.62 ft/sec, indicative of limited community ambulator   General Gait Details: cues for posture and proximity to RW with pt on 4L with sats 88-96%, pt self-regulating activity with fatigue end of session  Stairs            Wheelchair Mobility    Modified Rankin (Stroke Patients Only)       Balance Overall balance assessment: Needs assistance   Sitting balance-Leahy Scale: Fair     Standing balance support: Bilateral upper extremity supported, Reliant on assistive device for balance Standing balance-Leahy Scale: Poor Standing balance comment: RW in standing  Pertinent Vitals/Pain Pain Assessment Pain Assessment: No/denies pain    Home Living Family/patient expects to be discharged to:: Private residence Living Arrangements: Spouse/significant other Available Help at  Discharge: Family;Available 24 hours/day Type of Home: House Home Access: Stairs to enter Entrance Stairs-Rails: Right Entrance Stairs-Number of Steps: 1   Home Layout: One level Home Equipment: Conservation officer, nature (2 wheels);Wheelchair - manual;Cane - single point;BSC/3in1      Prior Function Prior Level of Function : Needs assist       Physical Assist : ADLs (physical)   ADLs (physical): Bathing;Dressing Mobility Comments: can get OOB and walk on his own about 36' max at a time ADLs Comments: wife performs complete sponge bathes, helps with dressing, wife does the homemaking     Hand Dominance        Extremity/Trunk Assessment   Upper Extremity Assessment Upper Extremity Assessment: Generalized weakness    Lower Extremity Assessment Lower Extremity Assessment: Generalized weakness    Cervical / Trunk Assessment Cervical / Trunk Assessment: Kyphotic;Other exceptions Cervical / Trunk Exceptions: ascites  Communication   Communication: No difficulties  Cognition Arousal/Alertness: Awake/alert Behavior During Therapy: WFL for tasks assessed/performed Overall Cognitive Status: Within Functional Limits for tasks assessed                                          General Comments      Exercises     Assessment/Plan    PT Assessment Patient needs continued PT services  PT Problem List Decreased strength;Decreased mobility;Decreased activity tolerance;Decreased balance;Decreased knowledge of use of DME       PT Treatment Interventions Gait training;Stair training;Functional mobility training;Therapeutic activities;Patient/family education;Therapeutic exercise;DME instruction    PT Goals (Current goals can be found in the Care Plan section)  Acute Rehab PT Goals Patient Stated Goal: return home, get this fluid off PT Goal Formulation: With patient Time For Goal Achievement: 10/07/21 Potential to Achieve Goals: Fair    Frequency Min 3X/week      Co-evaluation               AM-PAC PT "6 Clicks" Mobility  Outcome Measure Help needed turning from your back to your side while in a flat bed without using bedrails?: A Little Help needed moving from lying on your back to sitting on the side of a flat bed without using bedrails?: A Little Help needed moving to and from a bed to a chair (including a wheelchair)?: A Little Help needed standing up from a chair using your arms (e.g., wheelchair or bedside chair)?: A Little Help needed to walk in hospital room?: A Little Help needed climbing 3-5 steps with a railing? : A Lot 6 Click Score: 17    End of Session   Activity Tolerance: Patient tolerated treatment well Patient left: in chair;with call bell/phone within reach;with nursing/sitter in room Nurse Communication: Mobility status PT Visit Diagnosis: Other abnormalities of gait and mobility (R26.89);Difficulty in walking, not elsewhere classified (R26.2);Muscle weakness (generalized) (M62.81)    Time: 1638-4665 PT Time Calculation (min) (ACUTE ONLY): 39 min   Charges:   PT Evaluation $PT Eval Moderate Complexity: 1 Mod PT Treatments $Gait Training: 8-22 mins $Therapeutic Activity: 8-22 mins        Bayard Males, PT Acute Rehabilitation Services Office: [phone]   Gregory Tanner 09/23/2021, 11:34 AM

## 2021-09-24 DIAGNOSIS — Z7189 Other specified counseling: Secondary | ICD-10-CM

## 2021-09-24 DIAGNOSIS — R6521 Severe sepsis with septic shock: Secondary | ICD-10-CM

## 2021-09-24 DIAGNOSIS — A419 Sepsis, unspecified organism: Secondary | ICD-10-CM | POA: Diagnosis not present

## 2021-09-24 DIAGNOSIS — I472 Ventricular tachycardia, unspecified: Secondary | ICD-10-CM

## 2021-09-24 DIAGNOSIS — I4891 Unspecified atrial fibrillation: Secondary | ICD-10-CM | POA: Diagnosis not present

## 2021-09-24 LAB — BODY FLUID CULTURE W GRAM STAIN: Culture: NO GROWTH

## 2021-09-24 LAB — CBC
HCT: 32.8 % — ABNORMAL LOW (ref 39.0–52.0)
Hemoglobin: 10.5 g/dL — ABNORMAL LOW (ref 13.0–17.0)
MCH: 31 pg (ref 26.0–34.0)
MCHC: 32 g/dL (ref 30.0–36.0)
MCV: 96.8 fL (ref 80.0–100.0)
Platelets: 112 10*3/uL — ABNORMAL LOW (ref 150–400)
RBC: 3.39 MIL/uL — ABNORMAL LOW (ref 4.22–5.81)
RDW: 16.1 % — ABNORMAL HIGH (ref 11.5–15.5)
WBC: 11.8 10*3/uL — ABNORMAL HIGH (ref 4.0–10.5)
nRBC: 0 % (ref 0.0–0.2)

## 2021-09-24 LAB — MRSA NEXT GEN BY PCR, NASAL: MRSA by PCR Next Gen: NOT DETECTED

## 2021-09-24 LAB — MAGNESIUM: Magnesium: 2.2 mg/dL (ref 1.7–2.4)

## 2021-09-24 LAB — BASIC METABOLIC PANEL
Anion gap: 11 (ref 5–15)
BUN: 41 mg/dL — ABNORMAL HIGH (ref 8–23)
CO2: 20 mmol/L — ABNORMAL LOW (ref 22–32)
Calcium: 8 mg/dL — ABNORMAL LOW (ref 8.9–10.3)
Chloride: 94 mmol/L — ABNORMAL LOW (ref 98–111)
Creatinine, Ser: 4.17 mg/dL — ABNORMAL HIGH (ref 0.61–1.24)
GFR, Estimated: 15 mL/min — ABNORMAL LOW (ref >60)
Glucose, Bld: 102 mg/dL — ABNORMAL HIGH (ref 70–99)
Potassium: 4.6 mmol/L (ref 3.5–5.1)
Sodium: 125 mmol/L — ABNORMAL LOW (ref 135–145)

## 2021-09-24 LAB — GLUCOSE, CAPILLARY
Glucose-Capillary: 98 mg/dL (ref 70–99)
Glucose-Capillary: 114 mg/dL — ABNORMAL HIGH (ref 70–99)
Glucose-Capillary: 123 mg/dL — ABNORMAL HIGH (ref 70–99)
Glucose-Capillary: 105 mg/dL — ABNORMAL HIGH (ref 70–99)

## 2021-09-24 LAB — PHOSPHORUS: Phosphorus: 6.6 mg/dL — ABNORMAL HIGH (ref 2.5–4.6)

## 2021-09-24 LAB — HEPARIN LEVEL (UNFRACTIONATED)
Heparin Unfractionated: 0.19 IU/mL — ABNORMAL LOW (ref 0.30–0.70)
Heparin Unfractionated: 0.16 IU/mL — ABNORMAL LOW (ref 0.30–0.70)

## 2021-09-24 MED ORDER — SODIUM CHLORIDE 0.9% FLUSH
10.0000 mL | Freq: Two times a day (BID) | INTRAVENOUS | Status: DC
  Administered 2021-09-24: 10 mL
  Administered 2021-09-24: 40 mL
  Administered 2021-09-25: 20 mL
  Administered 2021-09-25: 10 mL

## 2021-09-24 MED ORDER — CHLORHEXIDINE GLUCONATE CLOTH 2 % EX PADS
6.0000 | MEDICATED_PAD | Freq: Every day | CUTANEOUS | Status: DC
  Administered 2021-09-26 – 2021-09-27 (×2): 6 via TOPICAL

## 2021-09-24 MED ORDER — HEPARIN SODIUM (PORCINE) 1000 UNIT/ML DIALYSIS
2000.0000 [IU] | INTRAMUSCULAR | Status: DC | PRN
  Filled 2021-09-24: qty 2

## 2021-09-24 MED ORDER — MIDODRINE HCL 5 MG PO TABS
20.0000 mg | ORAL_TABLET | Freq: Three times a day (TID) | ORAL | Status: DC
  Administered 2021-09-24 – 2021-09-26 (×7): 20 mg via ORAL
  Filled 2021-09-24 (×7): qty 4

## 2021-09-24 MED ORDER — HEPARIN SODIUM (PORCINE) 1000 UNIT/ML IJ SOLN
INTRAMUSCULAR | Status: AC
  Filled 2021-09-24: qty 4

## 2021-09-24 MED ORDER — CHLORHEXIDINE GLUCONATE CLOTH 2 % EX PADS
6.0000 | MEDICATED_PAD | Freq: Every day | CUTANEOUS | Status: DC
  Administered 2021-09-24 – 2021-09-25 (×2): 6 via TOPICAL

## 2021-09-24 MED ORDER — SODIUM CHLORIDE 0.9% FLUSH: 10.0000 mL | INTRAVENOUS | Status: DC | PRN

## 2021-09-24 NOTE — Progress Notes (Signed)
NAME:  Gregory Tanner, MRN:  [id number], DOB:  1955/03/30, LOS: 4 ADMISSION DATE:  09/20/2021, CONSULTATION DATE:  09/20/21 REFERRING MD:  Lorin Mercy , CHIEF COMPLAINT:  sob, fatigue   History of Present Illness:  66 yo man with aflutter (recently dx on amio), ESRD on HD MWF started 03/2021 (just had fistula surgery in 4/13), diastolic CHF, pulmonary HTN, DM, s/p liver transplant 2013 (NASH cirrhosis), SAH after fall in feb, chronic ascites with peritonial catheter indwelling (drained daily), and chronic hypotension. Followed at [facility] for R heart failure with very high PA pressures and given a prognosis of 6 months about 5 months PTA.   He presented to [facility] ED 8/7 from HD where he was only able to receive one hour of tx due to tachycardia with rates up to 150. C/o fatigue x 2-3 days and mild chest pain. Upon arrival to the ED he was tachycardic in atrial flutter with rates 150-160. Treated with amio bolus and drip with improvement. Given CTX for SBP ppx. Had peritoneal fluid removed via indwelling cath 2.4L without improvement. While in ED the patient became more diaphoretic with worsening SOB and PCCM was consulted.  HR up to 200. Upon PCCM eval the patient was in extremis and required urgent intubation.   Pertinent  Medical History   has a past medical history of Ankylosis of lumbar spine (02/21/2014), Benign essential hypertension (10/26/2015), Chronic diastolic heart failure ([place]) (09/26/2016), Chronic pain associated with significant psychosocial dysfunction (06/17/2015), Degeneration of intervertebral disc of lumbar region (07/05/2013), ESRD (end stage renal disease) on dialysis ([place]) (03/27/2017), Hearing loss, Hepatocellular carcinoma ([place]) (12/27/2010), History of liver transplant ([place]) (07/20/2011), and Uncontrolled type 2 diabetes mellitus with insulin therapy (06/21/2018).  Bumex, allopurinol, amiodarone, insulin ss, tums, midodrine '10mg'$  pre dialysis, MVI, Cellcept '1000mg'$  qam and qhs, tart  cherry   Significant Hospital Events: Including procedures, antibiotic start and stop dates in addition to other pertinent events   8/7 admit started CRRT. 2.4L peritorneal fluid removed.  8/11 iHD started, still on Levophed, increasing midodrine  Interim History / Subjective:  Feeling ok today, no overnight events Levophed requirement down from 12mg to 154m  Objective   Blood pressure 126/69, pulse 91, temperature 98.8 F (37.1 C), temperature source Oral, resp. rate 15, height '5\' 10"'$  (1.778 m), weight 86.6 kg, SpO2 97 %.        Intake/Output Summary (Last 24 hours) at 09/24/2021 0935 Last data filed at 09/24/2021 0700 Gross per 24 hour  Intake 1174.1 ml  Output 925 ml  Net 249.1 ml    Filed Weights   09/22/21 0417 09/22/21 1200 09/23/21 0500  Weight: 88.4 kg 84.4 kg 86.6 kg    General:  chronically ill-appearing M, awake and sitting up in bed in no distress HEENT: MM pink/moist, sclera anicteric Neuro: awake, alert, oriented CV: s1s2 rrr, no m/r/g PULM:  no distress on 2L lungs clear GI: soft, bsx4 active  Extremities: warm/dry, 1+ edema  Skin: no rashes or lesions   Labs reviewed  Na 125 (Na 116 4 days ago) Platelets 112 K 4.6  Resolved Hospital Problem list     Assessment & Plan:    Shock septic vs cardiogenic.  Known end stage RV failure. Peritoneal fluid and echo looked okay. Suspect cardiogenic, echo with preserved EF and grade II diastolic dysfunction -blood cultures with NGTD -Procal 0.3>1.4>2.45 -WBC down-trending at 10k -afebrile last 24hrs -continue empiric ceftriaxone x5 days, complete today -midodrine increased by nephrology  ESRD on HD Hyperkalemia  Afib/flutter:  High output RV failure- follows at [facility].   Hospice has been recommended.  CVP 6 yesterday so CRRT discontinued -GOC discussion with patient and daughter, he would like to continue full and aggressive care at this time, discussed long term lack of treatment options but he is  slowly improving this admission - NE for MAP goal 65 mmHg - outpatient dry weight was difficult to determine, volume removal with HD today and possible extra HD session over the weekend -got albumin yesterday  -remains on Levophed - Restart home PO amio - per cardiology notes not felt to be a long term anti-coagulation candidate, in SR today, platelets low so will stop heparin gtt  -pt has indwelling peritoneal drain, feels that his peritoneal fluid level is high, will place orders for him to drain himself as he does at thome  Acute respiratory failure with hypoxia Improving -extubated to [place] yesterday   Cirrhosis s/p liver transplant: - continue PTA [place]ro and peritoneal drainage  Thrombocytopenia Acute, platelets 220->112 -heparin gtt held, continue to follow  Best Practice (right click and "Reselect all SmartList Selections" daily)   Diet/type: Regular consistency (see orders) DVT prophylaxis: prophylactic heparin GI prophylaxis: PPI Lines: Central line Foley:  N/A Code Status:  full code Last date of multidisciplinary goals of care discussion [8/10 pt wants full code and scope of care]  CRITICAL CARE Performed by: Otilio Carpen Berenize Gatlin   Total critical care time: 35 minutes  Critical care time was exclusive of separately billable procedures and treating other patients.  Critical care was necessary to treat or prevent imminent or life-threatening deterioration.  Critical care was time spent personally by me on the following activities: development of treatment plan with patient and/or surrogate as well as nursing, discussions with consultants, evaluation of patient's response to treatment, examination of patient, obtaining history from patient or surrogate, ordering and performing treatments and interventions, ordering and review of laboratory studies, ordering and review of radiographic studies, pulse oximetry and re-evaluation of patient's condition.   Otilio Carpen Esmond Hinch,  PA-C [facility] See Amion for pager If no response to pager , please call [phone] until 7pm After 7:00 pm call Elink  [phone]?[place]

## 2021-09-24 NOTE — Progress Notes (Signed)
[facility] Progress Note  Subjective: pt seen in ICU, on levo gtt 14 ug/min and IV heparin. No c/o today.   Vitals:   09/24/21 1015 09/24/21 1030 09/24/21 1045 09/24/21 1100  BP: (!) 116/54 (!) 119/57 (!) 132/53 (!) 129/53  Pulse: 82 90 88 87  Resp: (!) 27 (!) '22 14 20  '$ Temp:      TempSrc:      SpO2: 99% 98% 98% 98%  Weight:      Height:        Exam:  Alert, no distress  no jvd  Chest cta bilat and lat  Cor reg no RG  Abd soft ntnd no mass, 2+ ascites, +bs  Ext diffuse 2+ pitting bilat LE/ hip edema  Neuro is nonfocal    RIJ TDC+intact    Home meds include - allopurinol, amiodarone, bumetanide '4mg'$  qd non hd days, insulin lispro, midodrine 38mg pre HD mwf, MVI, mycophenolate 1 gm bid, prns/ vits/ supps    OP HD: MWF [facility]  4h  400/800   84.5kg   2/2.5 bath  TDC RIJ   Hep 4000+ 25040mrun - last Hep B labs here: 8/7 - last HD Sat (extra Rx), usual UF 3-5kg, comes off 1.5- 2.5 over - last Hb 12.5 - no esa, Fe or vdra - last pth 530, phos 7.6, Ca 8.7     CXR 8/7 - no edema, +vasc congestion   Assessment/ Plan: Atrial fib/ flutter w/ RVR - IV amio started, converted to NSR. Per pmd/ cards.  R HF/ severe cor pulmonale - w/ chronic ascites/ LE edema.  Shock - cardiogenic, on levo gtt 14 ug/min. Cont midodrine ^'d to 10 tid.  Volume excess - UF goal 2-2.5 L w/ HD today ESRD - HD MWF (started Feb 2023). SP CRRT 8/7- 8/9. Plan iHD today on schedule. Extra HD possibly tomorrow for volume.  IDDM - on insulin, per pmd Anemia esrd - Hb >10, no esa needs MBD ckd - CCa in range, phos slightly up. No vdra.  H/o liver transplant (2013) - on mycophenolate GOC - palliative team is following           Rob Saige [place] 09/24/2021, 11:17 AM   Recent Labs  Lab 09/22/21 0411 09/23/21 0457 09/23/21 0818 09/24/21 0423  HGB 13.4 11.0*  --  10.5*  ALBUMIN 2.8*  --  2.6*  --   CALCIUM 8.1*  --  7.6* 8.0*  PHOS 4.6  --  5.8* 6.6*  CREATININE 3.08*  --  3.67* 4.17*   K 4.4  --  4.8 4.6    No results for input(s): "IRON", "TIBC", "FERRITIN" in the last 168 hours. Inpatient medications:  allopurinol  150 mg Oral q morning   amiodarone  200 mg Oral Daily   Chlorhexidine Gluconate Cloth  6 each Topical Q0600   insulin aspart  0-6 Units Subcutaneous TID WC   midodrine  20 mg Oral Q8H   mycophenolate  1,000 mg Oral BID   sodium chloride flush  3 mL Intravenous Q12H    sodium chloride     cefTRIAXone (ROCEPHIN)  IV Stopped (09/23/21 1830)   heparin 1,550 Units/hr (09/24/21 0700)   norepinephrine (LEVOPHED) Adult infusion 17 mcg/min (09/24/21 0700)   promethazine (PHENERGAN) injection (IM or IVPB)     acetaminophen **OR** acetaminophen, calcium carbonate (dosed in mg elemental calcium), camphor-menthol **AND** hydrOXYzine, docusate sodium, docusate sodium, feeding supplement (NEPRO CARB STEADY), fentaNYL, fentaNYL, heparin, hydrALAZINE, LORazepam, midazolam, ondansetron **OR** ondansetron (ZOFRAN) IV,  mouth rinse, polyethylene glycol, polyvinyl alcohol, promethazine (PHENERGAN) injection (IM or IVPB), sorbitol, zolpidem

## 2021-09-24 NOTE — Progress Notes (Signed)
[place] for Heparin Indication: atrial fibrillation  Allergies  Allergen Reactions   Iodinated Contrast Media Hives and Other (See Comments)    Allergy is NOT to all contrast - but patient is unsure of which particular one his allergy is to. Contrast dye used before liver transplant cause hives, not sure which dye this was but is able to use others   Jardiance [Empagliflozin] Dermatitis    developed blisters with the medication, blisters  stopped after he discontinued taking it. (About 6 months ago /~06/2020)   Metformin And Related Nausea And Vomiting   Other Nausea And Vomiting    Patient states that he is allergic to "some antibiotic" but is not sure of the name of it.   Tape Other (See Comments)    Tore skin   Rosiglitazone Nausea And Vomiting    GI effects and abdominal pain    Patient Measurements: Height: '5\' 10"'$  (177.8 cm) Weight: 86.6 kg (190 lb 14.7 oz) IBW/kg (Calculated) : 73 Heparin Dosing Weight: 93.3 kg  Vital Signs: Temp: 98.3 F (36.8 C) (08/11 0341) Temp Source: Axillary (08/11 0341) BP: 120/62 (08/11 0400) Pulse Rate: 77 (08/11 0500)  Labs: Recent Labs    09/21/21 1750 09/21/21 2217 09/22/21 0411 09/22/21 1946 09/23/21 0457 09/23/21 0818 09/24/21 0423  HGB  --    < > 13.4  --  11.0*  --  10.5*  HCT  --   --  40.5  --  34.3*  --  32.8*  PLT  --   --  137*  --  117*  --  112*  HEPARINUNFRC  --    < > 0.60 0.75* 0.47  --  0.19*  CREATININE 3.54*  --  3.08*  --   --  3.67*  --    < > = values in this interval not displayed.     Estimated Creatinine Clearance: 20.2 mL/min (A) (by C-G formula based on SCr of 3.67 mg/dL (H)).  Assessment: 66 y.o. male admitted with VDRF, ESRD on CRRT and Afib. Pharmacy consulted to dose heparin for Afib. Not on AC PTA    Heparin level came back therapeutic at 0.47, on 1450 units/hr. Hgb 11, plt 117. No s/sx of bleeding or infusion issues.   8/11 AM update:  Heparin level  low Watch plts  Goal of Therapy:  Heparin level 0.3-0.7 units/ml Monitor platelets by anticoagulation protocol: Yes   Plan:  Inc heparin to 1550 units/hr 1400 heparin level  Watch plts  Narda Bonds, PharmD, BCPS Clinical Pharmacist Phone: [phone]

## 2021-09-24 NOTE — Progress Notes (Signed)
Progress Note  Patient Name: Gregory Tanner Date of Encounter: 09/24/2021  [facility] Cardiologist: Shirlee More, MD   Subjective   Complains of cough; no CP.   Inpatient Medications    Scheduled Meds:  allopurinol  150 mg Oral q morning   amiodarone  200 mg Oral Daily   Chlorhexidine Gluconate Cloth  6 each Topical Q0600   fentaNYL (SUBLIMAZE) injection  50 mcg Intravenous Once   insulin aspart  0-6 Units Subcutaneous TID WC   midazolam  2 mg Intravenous Once   midodrine  10 mg Oral TID WC   mycophenolate  1,000 mg Oral BID   sodium chloride flush  3 mL Intravenous Q12H   Continuous Infusions:  sodium chloride     cefTRIAXone (ROCEPHIN)  IV Stopped (09/23/21 1830)   dexmedetomidine (PRECEDEX) IV infusion Stopped (09/22/21 0900)   heparin 1,550 Units/hr (09/24/21 0700)   norepinephrine (LEVOPHED) Adult infusion 17 mcg/min (09/24/21 0700)   promethazine (PHENERGAN) injection (IM or IVPB)     PRN Meds: acetaminophen **OR** acetaminophen, calcium carbonate (dosed in mg elemental calcium), camphor-menthol **AND** hydrOXYzine, docusate sodium, docusate sodium, feeding supplement (NEPRO CARB STEADY), fentaNYL, fentaNYL, heparin, hydrALAZINE, LORazepam, midazolam, ondansetron **OR** ondansetron (ZOFRAN) IV, mouth rinse, phenylephrine, polyethylene glycol, polyvinyl alcohol, promethazine (PHENERGAN) injection (IM or IVPB), sorbitol, zolpidem   Vital Signs    Vitals:   09/24/21 0645 09/24/21 0700 09/24/21 0715 09/24/21 0730  BP: (!) 118/59 (!) 106/56  126/69  Pulse: 79 93 100 100  Resp: (!) 24 19 (!) 25 15  Temp:      TempSrc:      SpO2: 91% 91% 93% 92%  Weight:      Height:        Intake/Output Summary (Last 24 hours) at 09/24/2021 0755 Last data filed at 09/24/2021 0700 Gross per 24 hour  Intake 1174.1 ml  Output 925 ml  Net 249.1 ml       09/23/2021    5:00 AM 09/22/2021   12:00 PM 09/22/2021    4:17 AM  Last 3 Weights  Weight (lbs) 190 lb 14.7 oz 186 lb 1.1  oz 194 lb 14.2 oz  Weight (kg) 86.6 kg 84.4 kg 88.4 kg      Telemetry    Sinus - Personally Reviewed   Physical Exam   GEN: WD chronically ill appearing NAD Neck: Supple, JVP difficult to assess Cardiac: RRR Respiratory: Mild diffuse rhonchi GI: Positive ascites; cannot palpate masses MS: 1+ hip edema Neuro:  No focal findings Psych: Normal affect  Labs    High Sensitivity Troponin:   Recent Labs  Lab 09/20/21 0912  TROPONINIHS 30*      Chemistry Recent Labs  Lab 09/20/21 0912 09/20/21 0934 09/21/21 0510 09/21/21 1750 09/22/21 0411 09/23/21 0818 09/24/21 0423  NA 122*   < > 126* 132* 133* 128* 125*  K 6.7*   < > 5.9* 4.8 4.4 4.8 4.6  CL 88*   < > 93* 96* 97* 97* 94*  CO2 20*   < > 17* 21* 21* 21* 20*  GLUCOSE 150*   < > 141* 107* 125* 135* 102*  BUN 49*   < > 49* 31* 25* 29* 41*  CREATININE 5.28*   < > 4.89* 3.54* 3.08* 3.67* 4.17*  CALCIUM 8.8*   < > 8.4* 8.2* 8.1* 7.6* 8.0*  MG 2.2  --  2.0  --  2.4  --  2.2  PROT 5.6*  --   --   --  4.8*  --   --   ALBUMIN 3.4*  --  2.8* 2.8* 2.8* 2.6*  --   AST 20  --   --   --  25  --   --   ALT 24  --   --   --  28  --   --   ALKPHOS 134*  --   --   --  106  --   --   BILITOT 0.9  --   --   --  1.1  --   --   GFRNONAA 11*   < > 12* 18* 21* 17* 15*  ANIONGAP 14   < > 16* '15 15 10 11   '$ < > = values in this interval not displayed.      Hematology Recent Labs  Lab 09/22/21 0411 09/23/21 0457 09/24/21 0423  WBC 13.0* 10.7* 11.8*  RBC 4.34 3.52* 3.39*  HGB 13.4 11.0* 10.5*  HCT 40.5 34.3* 32.8*  MCV 93.3 97.4 96.8  MCH 30.9 31.3 31.0  MCHC 33.1 32.1 32.0  RDW 15.9* 16.0* 16.1*  PLT 137* 117* 112*    Thyroid  Recent Labs  Lab 09/21/21 0510  TSH 5.556*     BNP Recent Labs  Lab 09/20/21 0912  BNP 1,376.7*       Radiology    DG CHEST PORT 1 VIEW  Result Date: 09/23/2021 CLINICAL DATA:  [id number]; shortness of breath, congestive heart failure and end-stage renal disease EXAM: PORTABLE CHEST 1  VIEW COMPARISON:  Chest x-ray from yesterday FINDINGS: There has been interval extubation and removal of the NG tube. Again seen is the right transjugular tunneled catheter with its tip at the caval atrial junction. Cardiomediastinal silhouette is enlarged, stable. Low lung volume. Unchanged pulmonary vascular congestion. Atelectasis at the left lung base without significant interval change. Likely tiny effusion at the right side. IMPRESSION: There has been interval extubation and removal of the NG tube. Unchanged cardiomegaly and pulmonary vascular congestion. Left basilar atelectasis. Electronically Signed   By: Frazier Richards M.D.   On: 09/23/2021 08:50     Patient Profile     66 year old male with past medical history of hypertension, chronic diastolic/right-sided heart failure, end-stage renal disease dialysis dependent, cirrhosis with hepatocellular carcinoma status post liver transplant, diabetes mellitus, atrial fibrillation/flutter for evaluation of recurrent atrial flutter with rapid ventricular response. Transesophageal echocardiogram October 2022 showed normal LV function, D-shaped septum, moderate right ventricular enlargement, biatrial enlargement, moderate tricuspid regurgitation, trace aortic insufficiency.  Right heart catheterization October 2022 showed pulmonary capillary wedge pressure 17 with PA pressure 66/25.  Cardiac MRI November 2022 showed no anomalous pulmonary venous return. Recently he has had progressive increased weakness, increasing dyspnea, increasing bilateral lower extremity edema.  He went to dialysis today and his heart rate was elevated in the 130-140 range.  He was sent to the emergency room. He is found to be in atrial flutter and cardiology asked to evaluate.  Assessment & Plan    1 paroxysmal atrial flutter-patient remains in sinus rhythm.  Continue amiodarone at present dose.  Previously felt not to be an anticoagulation candidate.   2 right-sided heart  failure/ascites-volume removal per dialysis.     3 chronic hypotension-continue midodrine.  Wean norepinephrine as tolerated.   4 end-stage renal disease-dialysis per nephrology.   5 history of liver transplant   For questions or updates, please contact [place] Please consult [email or web address] for contact info under        Signed, Aaron Edelman  Stanford Breed, MD  09/24/2021, 7:55 AM

## 2021-09-24 NOTE — Progress Notes (Signed)
Physical Therapy Treatment Patient Details Name: Gregory Tanner MRN: [id number] DOB: 08-06-1955 Today's Date: 09/24/2021   History of Present Illness 66 yo male admitted 8/7 from dialysis center with tachycardia. Pt with Afib with Rt heart failure. Intubated and on CRRT 8/7-8/9. PMhx: ESRD on HD MWF, CHF, chronic hypotension, hepatocellular carcinoma, NASH cirrhosis s/p liver transplant, IDDM, chronic ascites with pleur X perfoming daily paracentesis at home, Afib/flutter    PT Comments    PT with increased tremor and fatigue today with activity, awaiting HD. Pt with minimal incontinent stool with ambulation to bathroom then able to have BM, assist for pericare and linen change. Pt with increased posterior bias in standing today needing physical assist to maintain standing.  Fem art line visually assessed prior to mobility by P.T. and post session by P.T. and RN   HR 92 96/49 98% 2L   Recommendations for follow up therapy are one component of a multi-disciplinary discharge planning process, led by the attending physician.  Recommendations may be updated based on patient status, additional functional criteria and insurance authorization.  Follow Up Recommendations  No PT follow up (pt refusing HHPT)     Assistance Recommended at Discharge Intermittent Supervision/Assistance  Patient can return home with the following A little help with walking and/or transfers;A little help with bathing/dressing/bathroom;Assistance with cooking/housework;Assist for transportation;Direct supervision/assist for medications management   Equipment Recommendations  None recommended by PT    Recommendations for Other Services       Precautions / Restrictions Precautions Precautions: Fall;Other (comment) Precaution Comments: Lt fem art line     Mobility  Bed Mobility Overal bed mobility: Needs Assistance Bed Mobility: Supine to Sit     Supine to sit: Min assist, HOB elevated     General bed  mobility comments: HOB 30 degrees, assist to lift trunk and pivot to EOB with cues for hand placement and assist of pad to pivot    Transfers Overall transfer level: Needs assistance   Transfers: Sit to/from Stand Sit to Stand: Min assist           General transfer comment: stand with min assist to rise with Rw present from bed. assist to rise from toilet with rail with cues for hand placement, assist for anterior translation    Ambulation/Gait Ambulation/Gait assistance: Min assist Gait Distance (Feet): 72 Feet Assistive device: Rolling walker (2 wheels) Gait Pattern/deviations: Step-through pattern, Decreased stride length, Trunk flexed   Gait velocity interpretation: <1.8 ft/sec, indicate of risk for recurrent falls   General Gait Details: pt maintains flexed trunk with RW too anterior and mod cues for correct posture and proximity. PT needing cues to attend to fatigue with gait. Pt walked 15' to bathroom then 45' with reliance on RW   Stairs             Wheelchair Mobility    Modified Rankin (Stroke Patients Only)       Balance Overall balance assessment: Needs assistance Sitting-balance support: No upper extremity supported, Feet supported Sitting balance-Leahy Scale: Fair Sitting balance - Comments: toilet without support   Standing balance support: Bilateral upper extremity supported, Reliant on assistive device for balance Standing balance-Leahy Scale: Poor Standing balance comment: RW and min physical assist in standing due to posterior bias                            Cognition Arousal/Alertness: Awake/alert Behavior During Therapy: WFL for tasks assessed/performed Overall Cognitive Status:  Impaired/Different from baseline Area of Impairment: Safety/judgement                         Safety/Judgement: Decreased awareness of deficits              Exercises      General Comments        Pertinent Vitals/Pain Pain  Assessment Pain Assessment: No/denies pain    Home Living                          Prior Function            PT Goals (current goals can now be found in the care plan section) Progress towards PT goals: Progressing toward goals    Frequency    Min 3X/week      PT Plan Current plan remains appropriate    Co-evaluation              AM-PAC PT "6 Clicks" Mobility   Outcome Measure  Help needed turning from your back to your side while in a flat bed without using bedrails?: A Little Help needed moving from lying on your back to sitting on the side of a flat bed without using bedrails?: A Little Help needed moving to and from a bed to a chair (including a wheelchair)?: A Little Help needed standing up from a chair using your arms (e.g., wheelchair or bedside chair)?: A Little Help needed to walk in hospital room?: A Little Help needed climbing 3-5 steps with a railing? : A Lot 6 Click Score: 17    End of Session   Activity Tolerance: Patient tolerated treatment well Patient left: in bed;with call bell/phone within reach (returned to bed for HD) Nurse Communication: Mobility status PT Visit Diagnosis: Other abnormalities of gait and mobility (R26.89);Difficulty in walking, not elsewhere classified (R26.2);Muscle weakness (generalized) (M62.81)     Time: 1962-2297 PT Time Calculation (min) (ACUTE ONLY): 33 min  Charges:  $Gait Training: 8-22 mins $Therapeutic Activity: 8-22 mins                     Bayard Males, PT Acute Rehabilitation Services Office: [place] 09/24/2021, 9:40 AM

## 2021-09-24 NOTE — Progress Notes (Signed)
[place] for Heparin Indication: atrial fibrillation  Allergies  Allergen Reactions   Iodinated Contrast Media Hives and Other (See Comments)    Allergy is NOT to all contrast - but patient is unsure of which particular one his allergy is to. Contrast dye used before liver transplant cause hives, not sure which dye this was but is able to use others   Jardiance [Empagliflozin] Dermatitis    developed blisters with the medication, blisters  stopped after he discontinued taking it. (About 6 months ago /~06/2020)   Metformin And Related Nausea And Vomiting   Other Nausea And Vomiting    Patient states that he is allergic to "some antibiotic" but is not sure of the name of it.   Tape Other (See Comments)    Tore skin   Rosiglitazone Nausea And Vomiting    GI effects and abdominal pain    Patient Measurements: Height: '5\' 10"'$  (177.8 cm) Weight: 86 kg (189 lb 9.5 oz) IBW/kg (Calculated) : 73 Heparin Dosing Weight: 93.3 kg  Vital Signs: Temp: 98 F (36.7 C) (08/11 1600) Temp Source: Oral (08/11 1600) BP: 121/96 (08/11 1315) Pulse Rate: 82 (08/11 1315)  Labs: Recent Labs    09/22/21 0411 09/22/21 1946 09/23/21 0457 09/23/21 0818 09/24/21 0423 09/24/21 1431  HGB 13.4  --  11.0*  --  10.5*  --   HCT 40.5  --  34.3*  --  32.8*  --   PLT 137*  --  117*  --  112*  --   HEPARINUNFRC 0.60   < > 0.47  --  0.19* 0.16*  CREATININE 3.08*  --   --  3.67* 4.17*  --    < > = values in this interval not displayed.     Estimated Creatinine Clearance: 17.7 mL/min (A) (by C-G formula based on SCr of 4.17 mg/dL (H)).  Assessment: 66 y.o. male admitted with VDRF, ESRD on CRRT and Afib. Pharmacy consulted to dose heparin for Afib. Not on AC PTA    Heparin level came back therapeutic at 0.47, on 1450 units/hr. Hgb 11, plt 117. No s/sx of bleeding or infusion issues.   Heparin level came back low again this PM. Rate has been charted correctly. We will  increase the rate to 1700 units/hr. Plts remained low.   Goal of Therapy:  Heparin level 0.3-0.7 units/ml Monitor platelets by anticoagulation protocol: Yes   Plan:  Inc heparin to 1700 units/hr 8 hr heparin level  Watch plts  Onnie Boer, PharmD, [place], AAHIVP, CPP Infectious Disease Pharmacist 09/24/2021 4:33 PM

## 2021-09-24 NOTE — Plan of Care (Signed)
  Interdisciplinary Goals of Care Family Meeting   Date carried out:: 09/24/2021  Location of the meeting: Bedside  Member's involved: Family Member or next of kin and Other: physician assistant  Durable Power of Attorney or acting medical decision maker:   spouse  Discussion: We discussed goals of care for Gregory Tanner .  Met with pt and daughter Caren Griffins, discussed Mr. General current status and overall prognosis.  He is aware that there are few options available to treat his HF and liver/renal failure beyond what is currently being offered.  He remains on Levophed and we discussed with his vasopegia it may be difficult to fully wean off this, though he has made progress over the last few days.  He states that he is hopeful to recover this hospitalization and would like to continue all intervention including CPR and intubation, though clearly states he would not want prolonged life support beyond several days if there is no reasonable hope of recovery  Code status: Full Code  Disposition: Continue current acute care   Time spent for the meeting: 25 minutes  Otilio Carpen Beulah Matusek 09/24/2021, 3:36 PM

## 2021-09-24 NOTE — Progress Notes (Signed)
Received patient in bed to unit.  Alert and oriented.  Informed consent signed and in  chart.   Treatment initiated:0940 Treatment completed: 8022  Patient tolerated well.  Transported back to the room  alert, without acute distress.  Hand-off given to patient's nurse.   Access used: catheter Access issues: none  Total UF removed: 2000 ml Medication(s) given: none Post HD VS: 120/80 P  88 R 20 Post HD weight: 86kg   [facility]

## 2021-09-25 DIAGNOSIS — A419 Sepsis, unspecified organism: Secondary | ICD-10-CM | POA: Diagnosis not present

## 2021-09-25 DIAGNOSIS — I4891 Unspecified atrial fibrillation: Secondary | ICD-10-CM | POA: Diagnosis not present

## 2021-09-25 DIAGNOSIS — Z992 Dependence on renal dialysis: Secondary | ICD-10-CM

## 2021-09-25 DIAGNOSIS — Z944 Liver transplant status: Secondary | ICD-10-CM

## 2021-09-25 DIAGNOSIS — N186 End stage renal disease: Secondary | ICD-10-CM | POA: Diagnosis not present

## 2021-09-25 DIAGNOSIS — J9601 Acute respiratory failure with hypoxia: Secondary | ICD-10-CM

## 2021-09-25 DIAGNOSIS — Z7189 Other specified counseling: Secondary | ICD-10-CM | POA: Diagnosis not present

## 2021-09-25 DIAGNOSIS — R6521 Severe sepsis with septic shock: Secondary | ICD-10-CM | POA: Diagnosis not present

## 2021-09-25 DIAGNOSIS — Z515 Encounter for palliative care: Secondary | ICD-10-CM

## 2021-09-25 DIAGNOSIS — I472 Ventricular tachycardia, unspecified: Secondary | ICD-10-CM | POA: Diagnosis not present

## 2021-09-25 LAB — CBC
HCT: 30.6 % — ABNORMAL LOW (ref 39.0–52.0)
Hemoglobin: 9.9 g/dL — ABNORMAL LOW (ref 13.0–17.0)
MCH: 31.2 pg (ref 26.0–34.0)
MCHC: 32.4 g/dL (ref 30.0–36.0)
MCV: 96.5 fL (ref 80.0–100.0)
Platelets: 114 10*3/uL — ABNORMAL LOW (ref 150–400)
RBC: 3.17 MIL/uL — ABNORMAL LOW (ref 4.22–5.81)
RDW: 15.7 % — ABNORMAL HIGH (ref 11.5–15.5)
WBC: 8.9 10*3/uL (ref 4.0–10.5)
nRBC: 0 % (ref 0.0–0.2)

## 2021-09-25 LAB — BASIC METABOLIC PANEL
Anion gap: 11 (ref 5–15)
BUN: 29 mg/dL — ABNORMAL HIGH (ref 8–23)
CO2: 23 mmol/L (ref 22–32)
Calcium: 8.1 mg/dL — ABNORMAL LOW (ref 8.9–10.3)
Chloride: 94 mmol/L — ABNORMAL LOW (ref 98–111)
Creatinine, Ser: 3.32 mg/dL — ABNORMAL HIGH (ref 0.61–1.24)
GFR, Estimated: 20 mL/min — ABNORMAL LOW (ref >60)
Glucose, Bld: 89 mg/dL (ref 70–99)
Potassium: 4 mmol/L (ref 3.5–5.1)
Sodium: 128 mmol/L — ABNORMAL LOW (ref 135–145)

## 2021-09-25 LAB — CULTURE, BLOOD (ROUTINE X 2): Culture: NO GROWTH

## 2021-09-25 LAB — GLUCOSE, CAPILLARY
Glucose-Capillary: 86 mg/dL (ref 70–99)
Glucose-Capillary: 143 mg/dL — ABNORMAL HIGH (ref 70–99)
Glucose-Capillary: 157 mg/dL — ABNORMAL HIGH (ref 70–99)
Glucose-Capillary: 140 mg/dL — ABNORMAL HIGH (ref 70–99)

## 2021-09-25 LAB — HEPARIN LEVEL (UNFRACTIONATED)
Heparin Unfractionated: 0.22 IU/mL — ABNORMAL LOW (ref 0.30–0.70)
Heparin Unfractionated: 0.37 IU/mL (ref 0.30–0.70)

## 2021-09-25 LAB — MAGNESIUM: Magnesium: 1.8 mg/dL (ref 1.7–2.4)

## 2021-09-25 LAB — PHOSPHORUS: Phosphorus: 4.9 mg/dL — ABNORMAL HIGH (ref 2.5–4.6)

## 2021-09-25 MED ORDER — FENTANYL CITRATE PF 50 MCG/ML IJ SOSY
25.0000 ug | PREFILLED_SYRINGE | INTRAMUSCULAR | Status: DC | PRN
  Administered 2021-09-27 (×2): 50 ug via INTRAVENOUS
  Filled 2021-09-25 (×3): qty 1

## 2021-09-25 NOTE — Progress Notes (Addendum)
[place] for Heparin Indication: atrial fibrillation  Allergies  Allergen Reactions   Iodinated Contrast Media Hives and Other (See Comments)    Allergy is NOT to all contrast - but patient is unsure of which particular one his allergy is to. Contrast dye used before liver transplant cause hives, not sure which dye this was but is able to use others   Jardiance [Empagliflozin] Dermatitis    developed blisters with the medication, blisters  stopped after he discontinued taking it. (About 6 months ago /~06/2020)   Metformin And Related Nausea And Vomiting   Other Nausea And Vomiting    Patient states that he is allergic to "some antibiotic" but is not sure of the name of it.   Tape Other (See Comments)    Tore skin   Rosiglitazone Nausea And Vomiting    GI effects and abdominal pain    Patient Measurements: Height: '5\' 10"'$  (177.8 cm) Weight: 87.2 kg (192 lb 3.9 oz) IBW/kg (Calculated) : 73 Heparin Dosing Weight: 93.3 kg  Vital Signs: Temp: 98.9 F (37.2 C) (08/12 1145) Temp Source: Oral (08/12 1145) BP: 122/56 (08/12 1130) Pulse Rate: 87 (08/12 0830)  Labs: Recent Labs    09/23/21 0457 09/23/21 0818 09/24/21 0423 09/24/21 1431 09/25/21 0037 09/25/21 0414 09/25/21 1100  HGB 11.0*  --  10.5*  --   --  9.9*  --   HCT 34.3*  --  32.8*  --   --  30.6*  --   PLT 117*  --  112*  --   --  114*  --   HEPARINUNFRC 0.47  --  0.19* 0.16* 0.22*  --  0.37  CREATININE  --  3.67* 4.17*  --   --  3.32*  --      Estimated Creatinine Clearance: 22.3 mL/min (A) (by C-G formula based on SCr of 3.32 mg/dL (H)).  Assessment: 66 y.o. male admitted with VDRF, ESRD on CRRT and Afib. Pharmacy consulted to dose heparin for Afib. Not on AC PTA    Heparin level came back therapeutic at 0.37, on 1850 units/hr. Hgb 9.9, plt 114. No s/sx of bleeding or infusion issues.   Goal of Therapy:  Heparin level 0.3-0.7 units/ml Monitor platelets by  anticoagulation protocol: Yes   Plan:  Continue heparin infusion at 1850 units/hr Monitor daily HL, CBC, and for s/sx of bleeding   Antonietta Jewel, PharmD, [place] Pharmacist  Phone: [phone] 09/25/2021 11:51 AM  Please check AMION for all [place] phone numbers After 10:00 PM, call [place]  ADDENDUM  Patient not consider long term candidate for anticoagulation per notes. Has been maintaining NSR so will stop heparin infusion and monitor for recurrence.   Antonietta Jewel, PharmD, [place] Clinical Pharmacist

## 2021-09-25 NOTE — Plan of Care (Signed)
  Problem: Clinical Measurements: Goal: Will remain free from infection Outcome: Progressing Goal: Diagnostic test results will improve Outcome: Progressing Goal: Respiratory complications will improve Outcome: Progressing   Problem: Activity: Goal: Risk for activity intolerance will decrease Outcome: Progressing   Problem: Nutrition: Goal: Adequate nutrition will be maintained Outcome: Progressing   Problem: Coping: Goal: Level of anxiety will decrease Outcome: Progressing   Problem: Elimination: Goal: Will not experience complications related to bowel motility Outcome: Completed/Met Goal: Will not experience complications related to urinary retention Outcome: Completed/Met   Problem: Pain Managment: Goal: General experience of comfort will improve Outcome: Progressing   Problem: Safety: Goal: Ability to remain free from injury will improve Outcome: Progressing   Problem: Skin Integrity: Goal: Risk for impaired skin integrity will decrease Outcome: Progressing   Problem: Activity: Goal: Capacity to carry out activities will improve Outcome: Progressing   Problem: Cardiac: Goal: Ability to achieve and maintain adequate cardiopulmonary perfusion will improve Outcome: Progressing

## 2021-09-25 NOTE — Progress Notes (Signed)
Pt HD bedside complete w/ no complications.  Start 0830 End 1145 3L UFR 78L BVP Patient tolerated treatment. Report to Burman Freestone, primary RN No HD meds  Safety maintained.

## 2021-09-25 NOTE — Progress Notes (Signed)
Pt requests anti anxiety medication for HD tx. Primary RN notified.

## 2021-09-25 NOTE — Progress Notes (Signed)
[place], MD present in patient room. HD ordered changed to 3L as tolerated and sbp >80. Pt alert, stable. Judson Roch, primary RN present as well. Pt safety maintained.

## 2021-09-25 NOTE — Progress Notes (Signed)
[place] for Heparin Indication: atrial fibrillation  Allergies  Allergen Reactions   Iodinated Contrast Media Hives and Other (See Comments)    Allergy is NOT to all contrast - but patient is unsure of which particular one his allergy is to. Contrast dye used before liver transplant cause hives, not sure which dye this was but is able to use others   Jardiance [Empagliflozin] Dermatitis    developed blisters with the medication, blisters  stopped after he discontinued taking it. (About 6 months ago /~06/2020)   Metformin And Related Nausea And Vomiting   Other Nausea And Vomiting    Patient states that he is allergic to "some antibiotic" but is not sure of the name of it.   Tape Other (See Comments)    Tore skin   Rosiglitazone Nausea And Vomiting    GI effects and abdominal pain    Patient Measurements: Height: '5\' 10"'$  (177.8 cm) Weight: 86 kg (189 lb 9.5 oz) IBW/kg (Calculated) : 73 Heparin Dosing Weight: 93.3 kg  Vital Signs: Temp: 98 F (36.7 C) (08/11 2345) Temp Source: Oral (08/11 2345) BP: 117/57 (08/12 0030) Pulse Rate: 77 (08/12 0030)  Labs: Recent Labs    09/22/21 0411 09/22/21 1946 09/23/21 0457 09/23/21 0818 09/24/21 0423 09/24/21 1431 09/25/21 0037  HGB 13.4  --  11.0*  --  10.5*  --   --   HCT 40.5  --  34.3*  --  32.8*  --   --   PLT 137*  --  117*  --  112*  --   --   HEPARINUNFRC 0.60   < > 0.47  --  0.19* 0.16* 0.22*  CREATININE 3.08*  --   --  3.67* 4.17*  --   --    < > = values in this interval not displayed.     Estimated Creatinine Clearance: 17.7 mL/min (A) (by C-G formula based on SCr of 4.17 mg/dL (H)).  Assessment: 66 y.o. male admitted with VDRF, ESRD on CRRT and Afib. Pharmacy consulted to dose heparin for Afib. Not on AC PTA    Heparin level came back therapeutic at 0.47, on 1450 units/hr. Hgb 11, plt 117. No s/sx of bleeding or infusion issues.   8/12 AM update:  Heparin level low Watch  plts  Goal of Therapy:  Heparin level 0.3-0.7 units/ml Monitor platelets by anticoagulation protocol: Yes   Plan:  Inc heparin to 1850 units/hr 1000 heparin level  Watch plts  Narda Bonds, PharmD, BCPS Clinical Pharmacist Phone: [phone]

## 2021-09-25 NOTE — Progress Notes (Signed)
HD end 

## 2021-09-25 NOTE — Progress Notes (Signed)
[facility] Progress Note  Subjective: pt seen in ICU, levo gtt down to 9 ug/min. On HD now.   Vitals:   09/25/21 0930 09/25/21 0945 09/25/21 1000 09/25/21 1015  BP: (!) 131/57 (!) 108/55 (!) 107/54   Pulse:      Resp: (!) 22 (!) 26    Temp:      TempSrc:      SpO2:    98%  Weight:      Height:        Exam:  Alert, no distress  no jvd  Chest cta bilat and lat  Cor reg no RG  Abd soft ntnd no mass, 2+ ascites, +bs  Ext diffuse 2+ pitting bilat LE/ hip edema  Neuro is nonfocal    RIJ TDC+intact    Home meds include - allopurinol, amiodarone, bumetanide '4mg'$  qd non hd days, insulin lispro, midodrine 64mg pre HD mwf, MVI, mycophenolate 1 gm bid, prns/ vits/ supps    OP HD: MWF [facility]  4h  400/800   84.5kg   2/2.5 bath  TDC RIJ   Hep 4000+ 2503mrun - last Hep B labs here: 8/7 - last HD Sat (extra Rx), usual UF 3-5kg, comes off 1.5- 2.5 over - last Hb 12.5 - no esa, Fe or vdra - last pth 530, phos 7.6, Ca 8.7     CXR 8/7 - no edema, +vasc congestion   Assessment/ Plan: Atrial fib/ flutter w/ RVR - IV amio started, converted to NSR. Per pmd/ cards.  R HF/ severe cor pulmonale - w/ chronic ascites/ LE edema.  Shock - cardiogenic, on levo gtt 9 ug/min. Cont midodrine 10 tid.  Volume excess - UF goal 3 L today, extra HD for vol excess. Pt says that "they can pull 5 liters" at his OP unit.  ESRD - HD MWF (started Feb 2023). SP CRRT 8/7- 8/9. Had HD yest. Extra HD today as above.  IDDM - on insulin, per pmd Anemia esrd - Hb >10, no esa needs MBD ckd - CCa in range, phos slightly up. No vdra.  H/o liver transplant (2013) - on mycophenolate GOC - palliative team is following           Rob Levon Boettcher 09/25/2021, 10:22 AM   Recent Labs  Lab 09/22/21 0411 09/23/21 0457 09/23/21 0818 09/24/21 0423 09/25/21 0414  HGB 13.4   < >  --  10.5* 9.9*  ALBUMIN 2.8*  --  2.6*  --   --   CALCIUM 8.1*  --  7.6* 8.0* 8.1*  PHOS 4.6  --  5.8* 6.6* 4.9*  CREATININE  3.08*  --  3.67* 4.17* 3.32*  K 4.4  --  4.8 4.6 4.0   < > = values in this interval not displayed.    No results for input(s): "IRON", "TIBC", "FERRITIN" in the last 168 hours. Inpatient medications:  allopurinol  150 mg Oral q morning   amiodarone  200 mg Oral Daily   Chlorhexidine Gluconate Cloth  6 each Topical Q0600   Chlorhexidine Gluconate Cloth  6 each Topical Q0600   insulin aspart  0-6 Units Subcutaneous TID WC   midodrine  20 mg Oral Q8H   mycophenolate  1,000 mg Oral BID   sodium chloride flush  10-40 mL Intracatheter Q12H   sodium chloride flush  3 mL Intravenous Q12H    sodium chloride 10 mL/hr at 09/25/21 0700   heparin 1,850 Units/hr (09/25/21 0700)   norepinephrine (LEVOPHED) Adult infusion 8 mcg/min (09/25/21  0700)   promethazine (PHENERGAN) injection (IM or IVPB)     acetaminophen **OR** acetaminophen, calcium carbonate (dosed in mg elemental calcium), camphor-menthol **AND** hydrOXYzine, docusate sodium, docusate sodium, feeding supplement (NEPRO CARB STEADY), fentaNYL, fentaNYL, heparin, heparin, hydrALAZINE, LORazepam, midazolam, ondansetron **OR** ondansetron (ZOFRAN) IV, mouth rinse, polyethylene glycol, polyvinyl alcohol, promethazine (PHENERGAN) injection (IM or IVPB), sodium chloride flush, sorbitol, zolpidem

## 2021-09-25 NOTE — Consult Note (Signed)
Palliative Care Consult Note                                  Date: 09/25/2021   Patient Name: Gregory Tanner  DOB: Aug 23, 1955  MRN: [id number]  Age / Sex: 66 y.o., male  PCP: Gregory Hipps, MD Referring Physician: Garner Nash, DO  Reason for Consultation: Establishing goals of care   HPI/Patient Profile: 66 y.o. male  with past medical history of ESRD on HD MWF started UV/2536, chronic diastolic heart failure, pulmonary hypertension, type 2 diabetes, status post liver transplant 2013 secondary to cryptogenic cirrhosis, subarachnoid hemorrhage after a fall in February, and chronic ascites with indwelling peritoneal catheter.  Followed at [facility] heart failure clinic and was given a prognosis of 6 months approximately 5 months PTA.   He presented to [facility] ED on 09/20/2021 from HD where he was only able to receive 1 hour of treatment due to tachycardia with rates up to 150.  He also complained of fatigue x2 to 3 days and mild chest pain.  While in the ED, patient became more diaphoretic with worsening shortness of breath requiring urgent intubation.  Admitted to PCCM with acute respiratory failure with hypoxemia and septic versus cardiogenic shock.  He was extubated 8/9.  However, it has been very difficult to wean pressors.  Palliative medicine was consulted for goals of care in the setting of ESRD and vasopressor dependence.  Past Medical History:  Diagnosis Date  . Ankylosis of lumbar spine 02/21/2014  . Benign essential hypertension 10/26/2015  . Chronic diastolic heart failure ([place]) 09/26/2016  . Chronic pain associated with significant psychosocial dysfunction 06/17/2015  . Degeneration of intervertebral disc of lumbar region 07/05/2013  . ESRD (end stage renal disease) on dialysis ([place]) 03/27/2017  . Hearing loss   . Hepatocellular carcinoma ([place]) 12/27/2010   Overview:  Embolized 6/12   . History of liver transplant ([place]) 07/20/2011    Overview:  06/07/2011 for cryptogenic cirrhosis and HCC   . Uncontrolled type 2 diabetes mellitus with insulin therapy 06/21/2018    Subjective:   I have reviewed medical records including EPIC notes, labs and imaging, and received update from patient's RN.  I assessed patient at bedside.  He is a currently undergoing HD.  He remains on vasopressors.  He is alert and aware.  He tells me that he knows he is dying and that this will happen" either at home or in the hospital".  He understands the probability he will pass  in the hospital as he is vasopressor dependent.  I met with his wife Gregory Tanner in the [facility] conference room to discuss diagnosis, prognosis, Gregory Tanner, Gregory Tanner, disposition, and options.  I introduced Palliative Medicine as specialized medical care for people living with serious illness. It focuses on providing relief from the symptoms and stress of a serious illness.   Created space and opportunity for patient and family to explore thoughts and feelings regarding current medical situation. Values and goals of care important to patient and family were attempted to be elicited.  Questions and concerns addressed. Family encouraged to call with questions or concerns.    Life Review: Gregory Tanner was born in [place].  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  He was forced to retire in 2013 due to liver transplant.  Gregory Tanner  and Gregory Tanner have been married for 39 years.  They have 1 daughter Caren Tanner who lives in [place].   Functional Status: Gregory Tanner and Gregory Tanner live together in their home in [place].  At baseline, Gregory Tanner is able to ambulate around the house and walk to the mailbox.  He enjoys sitting on their deck.  He needs help with bathing.  He sleeps in a lift chair, as he cannot sleep supine due to orthopnea.  They have a home health RN that comes twice weekly (on Tuesdays and Thursdays) to change his BLE  dressings.   Patient/Family Understanding of Illness: Patient and wife clearly understand that he is critically ill and will likely not survive this illness.  Discussion: We discussed patient's current illness and what it means in the larger context of his ongoing co-morbidities. Current clinical status was reviewed. Natural disease trajectory of chronic illness was discussed.  Discussion had regarding the seriousness of his current medical situation. We discussed his problems including septic/cardiogenic shock, acute respiratory failure, ESRD, and right heart failure. We also discussed that he has been on pressors for days and that he may not be able to come off.        Gregory Tanner states feeling that "he is not going to make it much longer".  She states that he has been through a lot and that his body is tired.  She shares that Gregory Tanner has told her that "he is not going to make it out of here" (referring to the hospital).    The difference between full scope medical intervention and comfort care was discussed.  Gregory Tanner clearly understands his guarded prognosis, however states that Gregory Tanner is not ready to stop dialysis.  He wants to continue dialysis until his body can no longer tolerate it.  In the unlikely event he could be stabilized to leave the hospital, then Gregory Tanner would want to bring him home with hospice care.  We did discuss code status. Encouraged consideration of DNR/DNI status understanding evidenced based poor outcomes in similar hospitalized patients, as the cause of the arrest is likely associated with chronic/terminal disease rather than a reversible acute cardio-pulmonary event. I explained that DNR/DNI is a protective measure to keep [place] from harming the patient in their last moments of life. Gregory Tanner agrees that DNR/DNI status is appropriate.  Referring to his intubation on admission, Gregory Tanner states that Gregory Tanner told her next time to "let me go".  Discussed the importance of continued conversation  with patient, family, and the medical team regarding overall plan of care and treatment options, ensuring decisions are within the context of the patients values and GOCs.    Review of Systems  Neurological:  Positive for weakness.  Psychiatric/Behavioral:  The patient is nervous/anxious.     Objective:   Primary Diagnoses: Present on Admission: . Atrial fibrillation with RVR ([place]) . SVT (supraventricular tachycardia) ([place]) . Refractory ascites . Acute on chronic diastolic CHF (congestive heart failure) ([place]) . Ventricular tachycardia ([place])   Physical Exam Constitutional:      General: He is not in acute distress.    Appearance: He is ill-appearing.  Cardiovascular:     Rate and Rhythm: Normal rate.  Pulmonary:     Effort: Pulmonary effort is normal.  Neurological:     Mental Status: He is alert and oriented to person, place, and time.     Motor: Weakness present.     Vital Signs:  BP (!) 122/56 (BP Location: Right Arm)   Pulse 87   Temp (  P) 98.9 F (37.2 C) (Oral)   Resp (!) 23   Ht 5' 10"  (1.778 m)   Wt 87.2 kg   SpO2 98%   BMI 27.58 kg/m   Palliative Assessment/Data: PPS 20-30%     Assessment & Plan:   SUMMARY OF RECOMMENDATIONS   CODE STATUS changed to DNR/DNI Continue current interventions PMT will continue to follow and support  Primary Decision Maker: Patient with support from his wife  Symptom Management:  Fentanyl 25 to 50 mcg IV every 2 hours as needed for pain or dyspnea Ativan 0.5 mg IV every 4 hours as needed for anxiety  Prognosis:  Overall poor  Discharge Planning:  To Be Determined   Discussed with: Dr. Valeta Harms   Thank you for allowing [place] to participate in the care of [name]   MDM - High   Signed by: Elie Confer, NP Palliative Medicine Team  Team Phone # [phone]  For individual providers, please see AMION

## 2021-09-25 NOTE — Progress Notes (Signed)
Pre HD RN assessment 

## 2021-09-25 NOTE — Progress Notes (Signed)
Cardiology Progress Note  Patient ID: Gregory Tanner MRN: [id number] DOB: 06-Jun-1955 Date of Encounter: 09/25/2021  Primary Cardiologist: Shirlee More, MD  Subjective   Chief Complaint: SOB  HPI: Remains on vasopressors.  Still hypotensive.  Trying dialysis.  Limited options for cardiac support.  ROS:  All other ROS reviewed and negative. Pertinent positives noted in the HPI.     Inpatient Medications  Scheduled Meds:  allopurinol  150 mg Oral q morning   amiodarone  200 mg Oral Daily   Chlorhexidine Gluconate Cloth  6 each Topical Q0600   Chlorhexidine Gluconate Cloth  6 each Topical Q0600   insulin aspart  0-6 Units Subcutaneous TID WC   midodrine  20 mg Oral Q8H   mycophenolate  1,000 mg Oral BID   sodium chloride flush  10-40 mL Intracatheter Q12H   sodium chloride flush  3 mL Intravenous Q12H   Continuous Infusions:  sodium chloride 10 mL/hr at 09/25/21 0700   heparin 1,850 Units/hr (09/25/21 0700)   norepinephrine (LEVOPHED) Adult infusion 8 mcg/min (09/25/21 0700)   promethazine (PHENERGAN) injection (IM or IVPB)     PRN Meds: acetaminophen **OR** acetaminophen, calcium carbonate (dosed in mg elemental calcium), camphor-menthol **AND** hydrOXYzine, docusate sodium, docusate sodium, feeding supplement (NEPRO CARB STEADY), fentaNYL, fentaNYL, heparin, heparin, hydrALAZINE, LORazepam, midazolam, ondansetron **OR** ondansetron (ZOFRAN) IV, mouth rinse, polyethylene glycol, polyvinyl alcohol, promethazine (PHENERGAN) injection (IM or IVPB), sodium chloride flush, sorbitol, zolpidem   Vital Signs   Vitals:   09/25/21 0715 09/25/21 0730 09/25/21 0801 09/25/21 0830  BP: 113/60 (!) 110/57 (!) 101/43   Pulse: 97 100  87  Resp:   (!) 24   Temp:   98.4 F (36.9 C)   TempSrc:   Oral   SpO2: 92% 91%    Weight:      Height:        Intake/Output Summary (Last 24 hours) at 09/25/2021 0851 Last data filed at 09/25/2021 0730 Gross per 24 hour  Intake 1631.66 ml  Output  3075 ml  Net -1443.34 ml      09/25/2021    5:00 AM 09/24/2021    1:13 PM 09/24/2021    9:39 AM  Last 3 Weights  Weight (lbs) 192 lb 3.9 oz 189 lb 9.5 oz 194 lb 0.1 oz  Weight (kg) 87.2 kg 86 kg 88 kg      Telemetry  Overnight telemetry shows sinus rhythm in the 90s, intermittent atrial flutter, which I personally reviewed.   Physical Exam   Vitals:   09/25/21 0715 09/25/21 0730 09/25/21 0801 09/25/21 0830  BP: 113/60 (!) 110/57 (!) 101/43   Pulse: 97 100  87  Resp:   (!) 24   Temp:   98.4 F (36.9 C)   TempSrc:   Oral   SpO2: 92% 91%    Weight:      Height:        Intake/Output Summary (Last 24 hours) at 09/25/2021 0851 Last data filed at 09/25/2021 0730 Gross per 24 hour  Intake 1631.66 ml  Output 3075 ml  Net -1443.34 ml       09/25/2021    5:00 AM 09/24/2021    1:13 PM 09/24/2021    9:39 AM  Last 3 Weights  Weight (lbs) 192 lb 3.9 oz 189 lb 9.5 oz 194 lb 0.1 oz  Weight (kg) 87.2 kg 86 kg 88 kg    Body mass index is 27.58 kg/m.  General: Well nourished, well developed, in no acute distress  Head: Atraumatic, normal size  Eyes: PEERLA, EOMI  Neck: Supple, JVD 12 to 15 cm of water Endocrine: No thryomegaly Cardiac: Normal S1, S2; RRR; no murmurs, rubs, or gallops Lungs: Clear to auscultation bilaterally, no wheezing, rhonchi or rales  Abd: The abdomen Ext: 2+ pitting edema up to thighs Musculoskeletal: No deformities, BUE and BLE strength normal and equal Skin: Warm and dry, no rashes   Neuro: Alert and oriented to person, place, time, and situation, CNII-XII grossly intact, no focal deficits  Psych: Normal mood and affect   Labs  High Sensitivity Troponin:   Recent Labs  Lab 09/20/21 0912  TROPONINIHS 30*     Cardiac EnzymesNo results for input(s): "TROPONINI" in the last 168 hours. No results for input(s): "TROPIPOC" in the last 168 hours.  Chemistry Recent Labs  Lab 09/20/21 0912 09/20/21 0934 09/21/21 1750 09/22/21 0411 09/23/21 0818  09/24/21 0423 09/25/21 0414  NA 122*   < > 132* 133* 128* 125* 128*  K 6.7*   < > 4.8 4.4 4.8 4.6 4.0  CL 88*   < > 96* 97* 97* 94* 94*  CO2 20*   < > 21* 21* 21* 20* 23  GLUCOSE 150*   < > 107* 125* 135* 102* 89  BUN 49*   < > 31* 25* 29* 41* 29*  CREATININE 5.28*   < > 3.54* 3.08* 3.67* 4.17* 3.32*  CALCIUM 8.8*   < > 8.2* 8.1* 7.6* 8.0* 8.1*  PROT 5.6*  --   --  4.8*  --   --   --   ALBUMIN 3.4*   < > 2.8* 2.8* 2.6*  --   --   AST 20  --   --  25  --   --   --   ALT 24  --   --  28  --   --   --   ALKPHOS 134*  --   --  106  --   --   --   BILITOT 0.9  --   --  1.1  --   --   --   GFRNONAA 11*   < > 18* 21* 17* 15* 20*  ANIONGAP 14   < > '15 15 10 11 11   '$ < > = values in this interval not displayed.    Hematology Recent Labs  Lab 09/23/21 0457 09/24/21 0423 09/25/21 0414  WBC 10.7* 11.8* 8.9  RBC 3.52* 3.39* 3.17*  HGB 11.0* 10.5* 9.9*  HCT 34.3* 32.8* 30.6*  MCV 97.4 96.8 96.5  MCH 31.3 31.0 31.2  MCHC 32.1 32.0 32.4  RDW 16.0* 16.1* 15.7*  PLT 117* 112* 114*   BNP Recent Labs  Lab 09/20/21 0912  BNP 1,376.7*    DDimer No results for input(s): "DDIMER" in the last 168 hours.   Radiology  No results found.  Cardiac Studies  TTE 09/21/2021  1. Left ventricular ejection fraction, by estimation, is 55 to 60%. The  left ventricle has normal function. The left ventricle has no regional  wall motion abnormalities. Left ventricular diastolic parameters are  consistent with Grade II diastolic  dysfunction (pseudonormalization). Elevated left atrial pressure. There is  the interventricular septum is flattened in systole, consistent with right  ventricular pressure overload. The average left ventricular global  longitudinal strain is -18.9 %. The  global longitudinal strain is normal.   2. Right ventricular systolic function is normal. The right ventricular  size is mildly enlarged. There is moderately elevated pulmonary  artery  systolic pressure. The estimated  right ventricular systolic pressure is  29.9 mmHg.   3. Left atrial size was moderately dilated.   4. Right atrial size was moderately dilated.   5. The mitral valve is normal in structure. Mild mitral valve  regurgitation.   6. Tricuspid valve regurgitation is mild to moderate.   7. The aortic valve is tricuspid. There is mild thickening of the aortic  valve. Aortic valve regurgitation is not visualized. Aortic valve  sclerosis is present, with no evidence of aortic valve stenosis.   8. Aortic dilatation noted. There is mild dilatation of the aortic root,  measuring 42 mm.   9. The inferior vena cava is dilated in size with <50% respiratory  variability, suggesting right atrial pressure of 15 mmHg.   Patient Profile  JAVARIUS TSOSIE is a 67 y.o. male with pulmonary hypertension/cor pulmonale, ESRD on hemodialysis, cirrhosis status post liver transplantation with steatosis, diabetes, atrial fibrillation/flutter who was admitted on 09/20/2021 with acute respiratory failure secondary to volume overload.  Cardiology was consulted for atrial flutter management and right heart failure management.  Assessment & Plan   #Paroxysmal atrial flutter -By right heart failure.  Maintaining sinus rhythm. -Continue amiodarone 200 mg daily. -Per review of records not a candidate for anticoagulation.  Currently on heparin which is okay for now.  See discussion below on poor prognosis.  #Severe pulm hypertension #Cor pulmonale #End-stage right heart failure #Volume overload #End-stage renal disease on hemodialysis -At [facility] for longstanding history of pulm hypertension and right heart failure.  He is not a candidate for advanced therapies.  He has limited options.  Hospice has been recommended. -Now currently in the ICU.  Dependent on pressors to maintain volume removal on dialysis.  He seems to be hospital dependent for his care. -He has very limited options for advanced therapies.  Not a transplant  candidate.  This would require heart lung and kidney.  Clearly this is not indicated.  No mechanical support either is recommended.  Cannot be on digoxin.  Again very limited options. -For now we will continue with pressors and dialysis as you are able.  Amiodarone to maintain pain rhythm.  There is no medical therapy I can offer him.  Would recommend to continue with aggressive goals of care discussion.  Cardiology will follow along as needed.  Please call with questions.  [facility] will sign off.   Medication Recommendations: As above Other recommendations (labs, testing, etc): None Follow up as an outpatient: The patient follows at [facility].  If his condition improves to let us know and we can arrange follow-up in our office but I fear his condition is likely approaching hospice care.  For questions or updates, please contact [place] Please consult [email or web address] for contact info under   Signed, [place] T. Audie Box, MD, [place]  09/25/2021 8:51 AM

## 2021-09-25 NOTE — Progress Notes (Signed)
Pt wife present to check on patient. Updates given. Pt wife will return in 2 hours when HD tx complete. Pt sleeping, vss. Safety maintained.

## 2021-09-25 NOTE — Progress Notes (Signed)
NAME:  Gregory Tanner, MRN:  [id number], DOB:  [birth year], LOS: 5 ADMISSION DATE:  09/20/2021, CONSULTATION DATE:  09/20/21 REFERRING MD:  Lorin Mercy , CHIEF COMPLAINT:  sob, fatigue   History of Present Illness:  66 yo man with aflutter (recently dx on amio), ESRD on HD MWF started 03/2021 (just had fistula surgery in 2/58), diastolic CHF, pulmonary HTN, DM, s/p liver transplant 2013 (NASH cirrhosis), SAH after fall in feb, chronic ascites with peritonial catheter indwelling (drained daily), and chronic hypotension. Followed at [facility] for R heart failure with very high PA pressures and given a prognosis of 6 months about 5 months PTA.   He presented to [facility] ED 8/7 from HD where he was only able to receive one hour of tx due to tachycardia with rates up to 150. C/o fatigue x 2-3 days and mild chest pain. Upon arrival to the ED he was tachycardic in atrial flutter with rates 150-160. Treated with amio bolus and drip with improvement. Given CTX for SBP ppx. Had peritoneal fluid removed via indwelling cath 2.4L without improvement. While in ED the patient became more diaphoretic with worsening SOB and PCCM was consulted.  HR up to 200. Upon PCCM eval the patient was in extremis and required urgent intubation.   Pertinent  Medical History   has a past medical history of Ankylosis of lumbar spine (02/21/2014), Benign essential hypertension (10/26/2015), Chronic diastolic heart failure ([place]) (09/26/2016), Chronic pain associated with significant psychosocial dysfunction (06/17/2015), Degeneration of intervertebral disc of lumbar region (07/05/2013), ESRD (end stage renal disease) on dialysis ([place]) (03/27/2017), Hearing loss, Hepatocellular carcinoma ([place]) (12/27/2010), History of liver transplant ([place]) (07/20/2011), and Uncontrolled type 2 diabetes mellitus with insulin therapy (06/21/2018).  Bumex, allopurinol, amiodarone, insulin ss, tums, midodrine '10mg'$  pre dialysis, MVI, Cellcept '1000mg'$  qam and qhs, tart  cherry   Significant Hospital Events: Including procedures, antibiotic start and stop dates in addition to other pertinent events   8/7 admit started CRRT. 2.4L peritorneal fluid removed.  8/11 iHD started, still on Levophed, increasing midodrine  Interim History / Subjective:   Tolerating HD.  Remains on persistent vasopressor support.  Objective   Blood pressure (!) 108/55, pulse 87, temperature 98.4 F (36.9 C), temperature source Oral, resp. rate (!) 26, height '5\' 10"'$  (1.778 m), weight 87.2 kg, SpO2 91 %.        Intake/Output Summary (Last 24 hours) at 09/25/2021 0958 Last data filed at 09/25/2021 0730 Gross per 24 hour  Intake 1360.3 ml  Output 3075 ml  Net -1714.7 ml   Filed Weights   09/24/21 0939 09/24/21 1313 09/25/21 0500  Weight: 88 kg 86 kg 87.2 kg    General: Chronically ill-appearing elderly gentleman HEENT: NCAT, tracking appropriately Neuro: Alert oriented following commands CV: Regular rate rhythm S1-S2 PULM: Diminished breath sounds bilaterally GI: Soft, nontender nondistended Extremities: No significant edema Skin: No rash   Resolved Hospital Problem list     Assessment & Plan:    Shock septic vs cardiogenic.  ESRD on HD Hyperkalemia Afib/flutter:  High output RV failure\ Known end stage RV failure. Peritoneal fluid and echo looked okay. Suspect cardiogenic, echo with preserved EF and grade II diastolic dysfunction Plan: Continue antibiotics Midodrine increased Seems to be pressor dependent. Ongoing goals of care discussions at this time. I do not suspect he will survive this hospitalization Appreciate palliative care consultation and input.  Acute respiratory failure with hypoxia Improved off mechanical support  Cirrhosis s/p liver transplant: - continue PTA tacro and  peritoneal drainage  Thrombocytopenia Acute, platelets 220->112 -heparin gtt held, continue to follow  Goals of care: Now DNR  Best Practice (right click and  "Reselect all SmartList Selections" daily)   Diet/type: Regular consistency (see orders) DVT prophylaxis: prophylactic heparin GI prophylaxis: PPI Lines: Central line Foley:  N/A Code Status:  DNR Last date of multidisciplinary goals of care discussion [09/25/2021 palliative care discussions.  This patient is critically ill with multiple organ system failure; which, requires frequent high complexity decision making, assessment, support, evaluation, and titration of therapies. This was completed through the application of advanced monitoring technologies and extensive interpretation of multiple databases. During this encounter critical care time was devoted to patient care services described in this note for 32 minutes.  Garner Nash, DO [facility] 09/25/2021 9:58 AM

## 2021-09-26 DIAGNOSIS — I5033 Acute on chronic diastolic (congestive) heart failure: Secondary | ICD-10-CM | POA: Diagnosis not present

## 2021-09-26 DIAGNOSIS — N186 End stage renal disease: Secondary | ICD-10-CM | POA: Diagnosis not present

## 2021-09-26 DIAGNOSIS — Z944 Liver transplant status: Secondary | ICD-10-CM | POA: Diagnosis not present

## 2021-09-26 DIAGNOSIS — Z992 Dependence on renal dialysis: Secondary | ICD-10-CM

## 2021-09-26 DIAGNOSIS — Z7189 Other specified counseling: Secondary | ICD-10-CM | POA: Diagnosis not present

## 2021-09-26 LAB — GLUCOSE, CAPILLARY
Glucose-Capillary: 107 mg/dL — ABNORMAL HIGH (ref 70–99)
Glucose-Capillary: 141 mg/dL — ABNORMAL HIGH (ref 70–99)

## 2021-09-26 LAB — CBC
HCT: 30 % — ABNORMAL LOW (ref 39.0–52.0)
Hemoglobin: 9.9 g/dL — ABNORMAL LOW (ref 13.0–17.0)
MCH: 31.7 pg (ref 26.0–34.0)
MCHC: 33 g/dL (ref 30.0–36.0)
MCV: 96.2 fL (ref 80.0–100.0)
Platelets: 125 10*3/uL — ABNORMAL LOW (ref 150–400)
RBC: 3.12 MIL/uL — ABNORMAL LOW (ref 4.22–5.81)
RDW: 15.8 % — ABNORMAL HIGH (ref 11.5–15.5)
WBC: 8.7 10*3/uL (ref 4.0–10.5)
nRBC: 0 % (ref 0.0–0.2)

## 2021-09-26 MED ORDER — ONDANSETRON HCL 4 MG/2ML IJ SOLN
4.0000 mg | Freq: Four times a day (QID) | INTRAMUSCULAR | Status: DC | PRN
  Administered 2021-09-26: 4 mg via INTRAVENOUS
  Filled 2021-09-26: qty 2

## 2021-09-26 MED ORDER — GLYCOPYRROLATE 0.2 MG/ML IJ SOLN: 0.2000 mg | INTRAMUSCULAR | Status: DC | PRN

## 2021-09-26 MED ORDER — ACETAMINOPHEN 325 MG PO TABS: 650.0000 mg | ORAL_TABLET | Freq: Four times a day (QID) | ORAL | Status: DC | PRN

## 2021-09-26 MED ORDER — MORPHINE SULFATE (PF) 2 MG/ML IV SOLN
2.0000 mg | INTRAVENOUS | Status: DC | PRN
  Administered 2021-09-27: 4 mg via INTRAVENOUS
  Filled 2021-09-26: qty 2

## 2021-09-26 MED ORDER — HEPARIN SODIUM (PORCINE) 5000 UNIT/ML IJ SOLN
5000.0000 [IU] | Freq: Three times a day (TID) | INTRAMUSCULAR | Status: DC
Start: 2021-09-26 — End: 2021-09-26

## 2021-09-26 MED ORDER — GLYCOPYRROLATE 1 MG PO TABS: 1.0000 mg | ORAL_TABLET | ORAL | Status: DC | PRN

## 2021-09-26 MED ORDER — ONDANSETRON 4 MG PO TBDP: 4.0000 mg | ORAL_TABLET | Freq: Four times a day (QID) | ORAL | Status: DC | PRN

## 2021-09-26 MED ORDER — LORAZEPAM 2 MG/ML IJ SOLN
2.0000 mg | INTRAMUSCULAR | Status: DC | PRN
  Administered 2021-09-27 (×2): 2 mg via INTRAVENOUS
  Filled 2021-09-26 (×2): qty 1
  Filled 2021-09-26: qty 2

## 2021-09-26 MED ORDER — ACETAMINOPHEN 650 MG RE SUPP: 650.0000 mg | Freq: Four times a day (QID) | RECTAL | Status: DC | PRN

## 2021-09-26 MED ORDER — DIPHENHYDRAMINE HCL 50 MG/ML IJ SOLN: 25.0000 mg | INTRAMUSCULAR | Status: DC | PRN

## 2021-09-26 NOTE — Progress Notes (Signed)
[facility] Progress Note  Subjective: pt seen in ICU. Decision has been made for transition to comfort care per CCM.  See their notes  Vitals:   09/26/21 1930 09/26/21 2000 09/26/21 2052 09/26/21 2216  BP: 107/64 (!) 108/59 110/64 102/62  Pulse:    88  Resp: (!) 25 (!) 26 (!) 27 17  Temp:    98.2 F (36.8 C)  TempSrc:    Oral  SpO2:    95%  Weight:      Height:        Exam:  Alert, no distress  no jvd  Chest cta bilat and lat  Cor reg no RG  Abd soft ntnd no mass, 2+ ascites, +bs  Ext diffuse 2+ pitting bilat LE/ hip edema  Neuro is nonfocal    RIJ TDC+intact    Home meds include - allopurinol, amiodarone, bumetanide '4mg'$  qd non hd days, insulin lispro, midodrine 58mg pre HD mwf, MVI, mycophenolate 1 gm bid, prns/ vits/ supps    OP HD: MWF [place]  4h  400/800   84.5kg   2/2.5 bath  TDC RIJ   Hep 4000+ [id number] - last Hep B labs here: 8/7 - last HD Sat (extra Rx), usual UF 3-5kg, comes off 1.5- 2.5 over - last Hb 12.5 - no esa, Fe or vdra - last pth 530, phos 7.6, Ca 8.7      Assessment/ Plan:  R HF/ severe cor pulmonale - w/ chronic ascites/ LE edema. Transitioning to comfort care per CCM's discussion w/ the pt and family. No further dialysis. Will sign off.  Shock - cardiogenic  Volume excess - chronic issue ESRD - HD MWF (started Feb 2023). SP CRRT 8/7- 8/9. Not tolerating HD w/o pressor support.   Atrial fib/ flutter w/ RVR IDDM - on insulin, per pmd Anemia esrd - Hb >10, no esa needs H/o liver transplant (2013) - on mycophenolate   Rob Katha Kuehne 09/26/2021, 10:21 PM   Recent Labs  Lab 09/22/21 0411 09/23/21 0457 09/23/21 0818 09/24/21 0423 09/25/21 0414 09/26/21 0411  HGB 13.4   < >  --  10.5* 9.9* 9.9*  ALBUMIN 2.8*  --  2.6*  --   --   --   CALCIUM 8.1*  --  7.6* 8.0* 8.1*  --   PHOS 4.6  --  5.8* 6.6* 4.9*  --   CREATININE 3.08*  --  3.67* 4.17* 3.32*  --   K 4.4  --  4.8 4.6 4.0  --    < > = values in this interval not  displayed.    No results for input(s): "IRON", "TIBC", "FERRITIN" in the last 168 hours. Inpatient medications:  allopurinol  150 mg Oral q morning   Chlorhexidine Gluconate Cloth  6 each Topical Q0600   mycophenolate  1,000 mg Oral BID   sodium chloride flush  10-40 mL Intracatheter Q12H   sodium chloride flush  3 mL Intravenous Q12H    sodium chloride Stopped (09/25/21 1756)   norepinephrine (LEVOPHED) Adult infusion Stopped (09/26/21 1913)   acetaminophen **OR** acetaminophen, camphor-menthol **AND** hydrOXYzine, diphenhydrAMINE, fentaNYL (SUBLIMAZE) injection, glycopyrrolate **OR** glycopyrrolate **OR** glycopyrrolate, LORazepam, morphine injection, ondansetron **OR** ondansetron (ZOFRAN) IV, mouth rinse, polyethylene glycol, polyvinyl alcohol, sodium chloride flush, zolpidem

## 2021-09-26 NOTE — IPAL (Addendum)
  Interdisciplinary Goals of Care Family Meeting   Date carried out: 09/26/2021  Location of the meeting: Bedside  Member's involved: Physician, Nurse Practitioner, and Family Member or next of kin  Durable Power of Attorney or acting medical decision maker: Patient.     Discussion: We discussed goals of care for Gregory Tanner .  I met with the patient, his wife Gregory Tanner and daughter Gregory Tanner at the bedside. We talked about Gregory Tanner's chronic illnesses and his current critical illness course. Unfortunately Gregory Tanner has several end stage chronic conditions, and they have started to impact each other/other organ systems more than they previously have.   During this ICU course, Gregory Tanner is pressor dependent, reflective of these end stage processes. He cannot tolerate HD without increased pressor support -- meaning he cannot tolerate HD. We discussed that measures like pressors are helpful as a temporary measure if the underlying need can be improved, but in this instance the underlying processes are end stage.   The end of life for Gregory Tanner is rapidly approaching. Gregory Tanner and his family understand this, and are understandably emotional that the end is near (possibly hours-days off pressors vs days-weeks) instead of months away.   We discussed goals of care moving forward as indefinite pressor support is not feasible.   A plan has been made to transition to comfort care in-hospital. I think it is reasonable for the family to discuss timing of pressor cessation and reflect on if there are nearby people that may want to visit (they understand pressor cessation should be within a day or two). We discussed that upon pressor cessation I am not sure how much time Gregory Tanner will have -- his hemodynamics may rapidly decline or he may reach a somewhat steady state. If he is stable to consider transportation, at that time could consider home hospice  At this time: We will dc labs, imaging, and other measures not aimed at  optimizing comfort at this time.  I will order PRNs for pain/anxiety/secretions/ resp sx Liberalize visitation  Liberalize diet to allow outside food delivery  DNR No titration of NE Peritoneal drainage PRN to pt comfort   ** DO NOT CONSULT CHAPLAIN/SPIRITUAL SUPPORT**   Awaiting: Decision of timing for levophed cessation (Anticipate this might be later tonight) Transfer to palliative floor upon NE dc (will put in for transfer order now to start process)   Code status: Full DNR  Disposition: In-patient comfort care  Time spent for the meeting: 35 min    Gregory Gum MSN, AGACNP-BC [place] for pager  09/26/2021, 2:19 PM

## 2021-09-26 NOTE — Progress Notes (Signed)
NAME:  Gregory Tanner, MRN:  [id number], DOB:  [birth year], LOS: 6 ADMISSION DATE:  09/20/2021, CONSULTATION DATE:  09/20/21 REFERRING MD:  Lorin Mercy , CHIEF COMPLAINT:  sob, fatigue   History of Present Illness:  66 yo man with aflutter (recently dx on amio), ESRD on HD MWF started 03/2021 (just had fistula surgery in 7/61), diastolic CHF, pulmonary HTN, DM, s/p liver transplant 2013 (NASH cirrhosis), SAH after fall in feb, chronic ascites with peritonial catheter indwelling (drained daily), and chronic hypotension. Followed at [facility] for R heart failure with very high PA pressures and given a prognosis of 6 months about 5 months PTA.   He presented to [facility] ED 8/7 from HD where he was only able to receive one hour of tx due to tachycardia with rates up to 150. C/o fatigue x 2-3 days and mild chest pain. Upon arrival to the ED he was tachycardic in atrial flutter with rates 150-160. Treated with amio bolus and drip with improvement. Given CTX for SBP ppx. Had peritoneal fluid removed via indwelling cath 2.4L without improvement. While in ED the patient became more diaphoretic with worsening SOB and PCCM was consulted.  HR up to 200. Upon PCCM eval the patient was in extremis and required urgent intubation.   Pertinent  Medical History   has a past medical history of Ankylosis of lumbar spine (02/21/2014), Benign essential hypertension (10/26/2015), Chronic diastolic heart failure ([place]) (09/26/2016), Chronic pain associated with significant psychosocial dysfunction (06/17/2015), Degeneration of intervertebral disc of lumbar region (07/05/2013), ESRD (end stage renal disease) on dialysis ([place]) (03/27/2017), Hearing loss, Hepatocellular carcinoma ([place]) (12/27/2010), History of liver transplant ([place]) (07/20/2011), and Uncontrolled type 2 diabetes mellitus with insulin therapy (06/21/2018).  Bumex, allopurinol, amiodarone, insulin ss, tums, midodrine '10mg'$  pre dialysis, MVI, Cellcept '1000mg'$  qam and qhs, tart  cherry   Significant Hospital Events: Including procedures, antibiotic start and stop dates in addition to other pertinent events   8/7 admit started CRRT. 2.4L peritorneal fluid removed.  8/11 iHD started, still on Levophed, increasing midodrine 8/12 palli mtg -- DNR but wants to cont HD as long as tolerable   Interim History / Subjective:  Palliative mtg noted    Objective   Blood pressure 120/60, pulse 86, temperature 98 F (36.7 C), temperature source Oral, resp. rate 19, height '5\' 10"'$  (1.778 m), weight 87.8 kg, SpO2 93 %.        Intake/Output Summary (Last 24 hours) at 09/26/2021 1027 Last data filed at 09/26/2021 0739 Gross per 24 hour  Intake 1027.48 ml  Output 4125 ml  Net -3097.52 ml   Filed Weights   09/24/21 1313 09/25/21 0500 09/26/21 0600  Weight: 86 kg 87.2 kg 87.8 kg    General: Pleasant older adult M NAD  HEENT: NCAT pink mm glasses  Neuro: AAOx3 following commands  CV: rrr s1s2  PULM: Even unlabored. Diminished bases. Upper airway rattle intermittently GI:  soft ndnt  Extremities: No acute deformity  Skin: c/d/w    Resolved Hospital Problem list    Acute resp failure w hypoxia   Assessment & Plan:    Goals of Care  DNR  I was candid w the patient in stating he is nearing end of life and several chronic illnesses have reached their end stages.  Discussed his pressor dependence.   Discussed pressors and the current medications used to support his BP are not sustainable.  I explained that measures like pressors are only useful if we can fix the underlying  cause of why a person's BP is low, and in this instance I cannot fix the underlying causes.  Discussed his body's intolerance of RRT in that he has a baseline pressor requirement with superimposed pressor need just for UF   I recommended hospice care. Indicated that realistically this transition will happen in-hospital as I expect he will not be stable to safely transport home off of pressors.    He was accepting of this news and is more concerned about how to talk to his wife and daughter about this -- he feels that everybody (himself included) was under the impression he likely had weeks-months. He shares that he knew he was dying, he just did not realize the timeline.  I encouraged him to call his daughter when his wife arrives today, and ask her to come to the hospital for a meeting. He will notify his RN on their arrival and I have asked for her to reach out to me and I will d/w family   ______________________________________________  Shock superimposed on known end stage RV failure + diastolic HF, with pressor dependence  -think cardiogenic is driving + Possible vasoplegic component. Doubt sepsis  P -on NE and a very high dose midodrine -pt is essentially pressor dependent at this point, which is not a sustainable course.  -end stage of disease courses   ESRD  Intermittent hyperkalemia -intolerant of RRT in that he requires increased pressor support superimposed of this pressor dependence referenced above  -again, this is not a sustainable course and evidence of nearing end of life   Hx liver failure -tacro -peritoneal drainage as needed   Afib/flutter -amio   Acute Thrombocytopenia, improving  -trend      Best Practice (right click and "Reselect all SmartList Selections" daily)   Diet/type: Regular consistency (see orders) DVT prophylaxis: prophylactic heparin GI prophylaxis: PPI Lines: Central line Foley:  N/A Code Status:  DNR Last date of multidisciplinary goals of care discussion [09/25/2021 palliative care discussions.  CRITICAL CARE Performed by: Cristal Generous   Total critical care time: 40 minutes  Critical care time was exclusive of separately billable procedures and treating other patients. Critical care was necessary to treat or prevent imminent or life-threatening deterioration.  Critical care was time spent personally by me on the  following activities: development of treatment plan with patient and/or surrogate as well as nursing, discussions with consultants, evaluation of patient's response to treatment, examination of patient, obtaining history from patient or surrogate, ordering and performing treatments and interventions, ordering and review of laboratory studies, ordering and review of radiographic studies, pulse oximetry and re-evaluation of patient's condition.  [name] MSN, AGACNP-BC [place] for pager  09/26/2021, 10:27 AM

## 2021-09-27 DIAGNOSIS — Z794 Long term (current) use of insulin: Secondary | ICD-10-CM

## 2021-09-27 DIAGNOSIS — Z7189 Other specified counseling: Secondary | ICD-10-CM | POA: Diagnosis not present

## 2021-09-27 DIAGNOSIS — Z515 Encounter for palliative care: Secondary | ICD-10-CM

## 2021-09-27 DIAGNOSIS — I471 Supraventricular tachycardia: Secondary | ICD-10-CM

## 2021-09-27 DIAGNOSIS — I4891 Unspecified atrial fibrillation: Secondary | ICD-10-CM | POA: Diagnosis not present

## 2021-09-27 DIAGNOSIS — J9601 Acute respiratory failure with hypoxia: Secondary | ICD-10-CM

## 2021-09-27 DIAGNOSIS — Z66 Do not resuscitate: Secondary | ICD-10-CM

## 2021-09-27 DIAGNOSIS — E11649 Type 2 diabetes mellitus with hypoglycemia without coma: Secondary | ICD-10-CM

## 2021-09-27 DIAGNOSIS — Z4803 Encounter for change or removal of drains: Secondary | ICD-10-CM | POA: Diagnosis not present

## 2021-09-27 DIAGNOSIS — R000 Tachycardia, unspecified: Secondary | ICD-10-CM | POA: Diagnosis not present

## 2021-09-27 MED ORDER — ALUM & MAG HYDROXIDE-SIMETH 200-200-20 MG/5ML PO SUSP
30.0000 mL | ORAL | Status: DC | PRN
  Administered 2021-09-27: 30 mL via ORAL
  Filled 2021-09-27: qty 30

## 2021-09-27 MED ORDER — GLYCOPYRROLATE 1 MG PO TABS: 1.0000 mg | ORAL_TABLET | ORAL | Status: AC | PRN

## 2021-09-27 MED ORDER — ONDANSETRON 4 MG PO TBDP: 4.0000 mg | ORAL_TABLET | Freq: Four times a day (QID) | ORAL | 0 refills | Status: AC | PRN

## 2021-09-27 MED ORDER — CHLORPROMAZINE HCL 25 MG PO TABS
25.0000 mg | ORAL_TABLET | Freq: Once | ORAL | Status: AC | PRN
Start: 2021-09-27 — End: 2021-09-27
  Administered 2021-09-27: 25 mg via ORAL
  Filled 2021-09-27: qty 1

## 2021-09-27 MED ORDER — HYDROMORPHONE HCL 2 MG PO TABS: 2.0000 mg | ORAL_TABLET | ORAL | 0 refills | Status: AC | PRN

## 2021-09-27 MED ORDER — LIDOCAINE 5 % EX PTCH
1.0000 | MEDICATED_PATCH | Freq: Every day | CUTANEOUS | Status: DC
  Administered 2021-09-27: 1 via TRANSDERMAL
  Filled 2021-09-27: qty 1

## 2021-09-27 NOTE — Consult Note (Signed)
   [facility] Inpatient Consult   09/27/2021  [name] 05-Aug-1955 [id number]  [facility] [ACO] Patient: Gregory Tanner [facility]  Primary Care Provider:  Ronita Hipps, MD, [place], is an embedded provider with a Chronic Care Management team and program, and is listed for the transition of care follow up and appointments.  Patient was screened with brief review for less than 30 days readmission. Patient has transitioned to a hospice facility.  Plan: Post hospital needs are to be met at the hospice facility.   Please contact for further questions,  Natividad Brood, RN BSN [facility] Liaison  [phone] business mobile phone Toll free office [phone]  Fax number: [phone] [email or web address]

## 2021-09-27 NOTE — Progress Notes (Addendum)
Daily Progress Note   Patient Name: Gregory Tanner       Date: 09/27/2021 DOB: Oct 03, 1955  Age: 66 y.o. MRN#: [id number] Attending Physician: Domenic Polite, MD Primary Care Physician: Ronita Hipps, MD Admit Date: 09/20/2021 Length of Stay: 7 days  Reason for Consultation/Follow-up: Establishing goals of care  HPI/Patient Profile:  66 y.o. male  with past medical history of ESRD on HD MWF started [id number], chronic diastolic heart failure, pulmonary hypertension, type 2 diabetes, status post liver transplant 2013 secondary to cryptogenic cirrhosis, subarachnoid hemorrhage after a fall in February, and chronic ascites with indwelling peritoneal catheter.  [facility] heart failure clinic and was given a prognosis of 6 months approximately 5 months PTA.   He presented to [facility] ED on 09/20/2021 from HD where he was only able to receive 1 hour of treatment due to tachycardia with rates up to 150.  He also complained of fatigue x2 to 3 days and mild chest pain.  While in the ED, patient became more diaphoretic with worsening shortness of breath requiring urgent intubation.  Admitted to PCCM with acute respiratory failure with hypoxemia and septic versus cardiogenic shock.  He was extubated 8/9.  However, it has been very difficult to wean pressors.   Palliative medicine was consulted for goals of care in the setting of ESRD and vasopressor dependence.  Subjective:   Subjective: Chart Reviewed. Updates received. Patient Assessed. Created space and opportunity for patient and family to explore thoughts and feelings regarding current medical situation.  Today's Discussion: Today I met with the patient, his wife, and his daughter. His wife was a bit upset. She was under the impression he would be going to Hospice but was told this morning he would either pass in the hospital or go home with hospice (who would only come out twice a week). I assurred her this was not the only options. I explained our  goal is for residential hospice, but because patient sometimes pass very quickly when stopping vasopressors they wanted to be sure he would be stable enough to d/c. At this point he appears to be stable enough. I assured her we would consult residential hospice, TOC was present and has started working on this.   I explained that while I feel he is appropriate for residential hospice, the ultimate decision rests with the hospice medical director. Also, if he suddenly worsened this may preclude our ability to transfer him. Finally, there's a chance they may not have a bed available at this time. She verbalized understanding.  He did state he was able to have his lobster dinner prior do d/c pressors.  I provided emotional and general support through therapeutic listening, empathy, sharing of stories, and other techniques. I answered all questions and addressed all concerns to the best of my ability.  Review of Systems  Unable to perform ROS: Acuity of condition    Objective:   Vital Signs:  BP (!) 101/55 (BP Location: Left Arm)   Pulse 93   Temp 97.8 F (36.6 C) (Axillary)   Resp 18   Ht 5' 10"  (1.778 m)   Wt 87.8 kg   SpO2 93%   BMI 27.77 kg/m   Physical Exam: Physical Exam Constitutional:      General: He is not in acute distress.    Appearance: He is ill-appearing.  HENT:     Head: Normocephalic and atraumatic.  Pulmonary:     Effort: Pulmonary effort is normal. No respiratory distress.  Palliative Assessment/Data: 10%   Assessment & Plan:   Impression: Present on Admission:  Atrial fibrillation with RVR (HCC)  SVT (supraventricular tachycardia) (HCC)  Refractory ascites  Acute on chronic diastolic CHF (congestive heart failure) (HCC)  Ventricular tachycardia ([place])  66 year old male with multiple chronic and acute conditions as described above. Biggest issue this admission is heart failure which is now end-stage. He d/c pressors yesterday evening with god of  comfort care and transfer to hospice. There was miscommunication with the patient's family about his location for hospice services. We have reached out to residential hospice and anticipate transfer today or tomorrow. Final timeframe pending bed availability. Patient is comfortable at this time. Overall prognosis grave.  SUMMARY OF RECOMMENDATIONS   Remain DNR Comfort care Transfer to residential hospice when bed available PMT will continue to follow  Code Status: DNR  Prognosis: Hours - Days  Discharge Planning: Hospice facility  Discussed with: Patient, patient's family, medical team, nursing team, [facility] team  Thank you for allowing [place] to participate in the care of CLEVER GERALDO PMT will continue to support holistically.  Billing based on MDM: High  Problems Addressed: One acute or chronic illness or injury that poses a threat to life or bodily function  Amount and/or Complexity of Data: Category 3:Discussion of management or test interpretation with external physician/other qualified health care professional/appropriate source (not separately reported)  Risks: Parenteral controlled substances   Walden Field, NP Palliative Medicine Team  Team Phone # [phone] (Nights/Weekends)  10/13/2020, 8:17 AM

## 2021-09-27 NOTE — Progress Notes (Signed)
Notified MD of family's concern regarding intractable hiccups and pain patch. New orders placed.    Yehuda Mao, LPN

## 2021-09-27 NOTE — Progress Notes (Signed)
[facility] night cross cover note:   I was notified by RN of family's concerns regarding intractable hiccups in this patient he was transitioned to comfort care on 09/26/2021.  I confirmed with RN that the patient is able to take p.o. at this time.  I subsequently placed order for Thorazine 25 mg p.o. x1 dose prn for intractable hiccups.   Additionally, family requesting fentanyl patch for optimization of pain control.  I subsequently placed order for fentanyl patch.      Babs Bertin, DO Hospitalist

## 2021-09-27 NOTE — TOC Progression Note (Addendum)
Transition of Care [facility]) - Progression Note    Patient Details  Name: Gregory Tanner MRN: [id number] Date of Birth: [birth year]  Transition of Care [facility]) CM/SW [place], RN Phone Number: 09/27/2021, 10:49 AM  Clinical Narrative:    CM spoke with the patient's Bedside RN, Theda Sers, and she states that the patient's wife is tearful this morning and would like to have patient placed at an inpatient hospice facility.  I spoke with the patient's attending physician, Dr. Broadus John and she is agreeable to Inpatient hospice placement referral.  Palliative Care is present at the bedside at this time.  I called and placed a referral with [facility] after speaking with Webb Silversmith, CM.  She will review patient's clinicals and follow up with CM about bed availability.  09/27/2021 1215 - I spoke with Webb Silversmith, CM with [facility] and the patient has been accepted and has an available bed today.  I notified the attending physician, Dr. Broadus John and she will completed discharge orders and summary.  The patient's family completed the admission paperwork to the facility.  PTAR will be arranged.  Bedside nursing - Please call report to [facility] - at # [phone].  CM will continue to follow the patient for Inpatient Hospice placement at [facility].   Expected Discharge Plan: [place] Barriers to Discharge: No Barriers Identified  Expected Discharge Plan and Services Expected Discharge Plan: [place] In-house Referral: NA Discharge Planning Services: CM Consult Post Acute Care Choice: Residential Hospice Bed Living arrangements for the past 2 months: Single Family Home                           HH Arranged: RN [facility] Agency: [place] Date [place]: 09/23/21 Time [place]: 1209 Representative spoke with at [place]: [place] ([place]) Interventions    Readmission Risk Interventions    09/27/2021   10:46 AM 09/22/2021    3:49 PM 06/03/2021    9:04 AM  Readmission Risk Prevention Plan  Transportation Screening Complete Complete Complete  Medication Review Press photographer) Complete Complete Complete  PCP or Specialist appointment within 3-5 days of discharge Complete Complete Complete  HRI or Home Care Consult Complete Complete Complete  SW Recovery Care/Counseling Consult Complete Complete Complete  Palliative Care Screening Complete Not Applicable Not [place] Not Applicable Not Applicable Not Applicable

## 2021-09-27 NOTE — Progress Notes (Signed)
   This pt was was referred to [place] from [place]. I have reviewed the chart and discussed with our MD. I have discussed goals and hospice philosophy with the pt's wife and daughter who are at bedside. They are in agreement and do accept the bed offer made today. We can accept the pt today if MD in agreement.   The number to call report for nursing is [phone]   Thank you Webb Silversmith RN [phone]

## 2021-09-27 NOTE — Discharge Summary (Signed)
Physician Discharge Summary  Gregory Tanner IRW:[id number] DOB: Jun 09, 1955 DOA: 09/20/2021  PCP: Ronita Hipps, MD  Admit date: 09/20/2021 Discharge date: 09/27/2021  Time spent: 35  minutes  Recommendations for Outpatient Follow-up:  Residential hospice for comfort focused care   Discharge Diagnoses:  Cardiogenic shock Severe right heart failure Severe pulmonary hypertension Severe TR Recurrent ascites ESRD on dialysis   Atrial fibrillation with RVR (HCC)   SVT (supraventricular tachycardia) (HCC)   ESRD on dialysis (HCC)   Acute on chronic diastolic CHF (congestive heart failure) (HCC)   Type 2 diabetes mellitus ([place])   History of liver transplant ([place])   Goals of care, counseling/discussion   Refractory ascites   Ventricular tachycardia (HCC)   Septic shock ([place])   Acute hypoxemic respiratory failure ([place])   Discharge Condition: Guarded, poor prognosis  Diet recommendation: Comfort feeds  Filed Weights   09/24/21 1313 09/25/21 0500 09/26/21 0600  Weight: 86 kg 87.2 kg 87.8 kg    History of present illness:  66 year old male with history of hypertension, chronic diastolic CHF, severe right heart failure, pulmonary hypertension, severe TR, recurrent ascites chronic pain syndrome, ESRD on dialysis on Monday, Wednesday, Friday, diabetes type 2, NASH cirrhosis with hepatocellular carcinoma status post liver transplant presented  To [facility] ED 8/7 from hemodialysis with hypotension and tachycardia, in the ED he was started on Amio gtt., became more diaphoretic and extremis with severe tachycardia, admitted by [facility] and urgently intubated, started on [facility] Course:   Cardiogenic shock Atrial flutter Severe right heart failure, severe pulmonary hypertension Severe TR Recurrent ascites -Given prognosis of less than 6 months by advanced heart failure team at [facility] 4-5 months ago -Required mechanical ventilation and increasing pressor support in ICU -After  palliative care and goals of care discussions with critical care team in ICU he was transitioned to comfort care, taken off pressors -Hemodialysis discontinued -Discussed with spouse at bedside, TOC to discuss residential hospice options   ESRD on hemodialysis -Required CRRT initially, now comfort care, HD discontinued, nephrology has signed off   Hyponatremia:    Liver cirrhosis/history of hepatocellular carcinoma/ Refractory ascites:  -Now with a peritoneal catheter   Diabetes type 2:    Goals of care: Now comfort care   Consultants: PCCM, palliative  Discharge Exam: Vitals:   09/27/21 0755 09/27/21 1219  BP: (!) 101/55 (!) 98/58  Pulse: 93 63  Resp: 18 18  Temp: 97.8 F (36.6 C)   SpO2: 93%    General exam: Chronically ill male appears older than stated age, somnolent but arousable, mildly restless, oriented to self CVS: S1-S2, tachycardic Lungs: Poor air movement bilaterally Abdomen: Soft, nontender, peritoneal catheter noted Extremities: 2+ edema  Discharge Instructions   Discharge Instructions     Ambulatory Pleural Drainage Schedule   Complete by: As directed    Drain daily, up to max of 1L until patient is only able to drain out 143m. If <1542mfor 3 consecutive drains every other day, then call Interventional Radiology ([phone]) for evaluation and possible removal.   No wound care   Complete by: As directed       Allergies as of 09/27/2021       Reactions   Iodinated Contrast Media Hives, Other (See Comments)   Allergy is NOT to all contrast - but patient is unsure of which particular one his allergy is to. Contrast dye used before liver transplant cause hives, not sure which dye this was but is able to use others  Jardiance [empagliflozin] Dermatitis   developed blisters with the medication, blisters  stopped after he discontinued taking it. (About 6 months ago /~06/2020)   Metformin And Related Nausea And Vomiting   Other Nausea And Vomiting    Patient states that he is allergic to "some antibiotic" but is not sure of the name of it.   Tape Other (See Comments)   Tore skin   Rosiglitazone Nausea And Vomiting   GI effects and abdominal pain        Medication List     STOP taking these medications    allopurinol 300 MG tablet Commonly known as: ZYLOPRIM   amiodarone 200 MG tablet Commonly known as: PACERONE   Aspercreme Lidocaine 4 % Crea Generic drug: Lidocaine HCl   bumetanide 2 MG tablet Commonly known as: BUMEX   calcium carbonate 750 MG chewable tablet Commonly known as: TUMS EX   insulin lispro 100 UNIT/ML KwikPen Commonly known as: HUMALOG   midodrine 10 MG tablet Commonly known as: PROAMATINE   multivitamin with minerals Tabs tablet   mycophenolate 250 MG capsule Commonly known as: CELLCEPT   OVER THE COUNTER MEDICATION   TART CHERRY PO       TAKE these medications    acetaminophen 500 MG tablet Commonly known as: TYLENOL Take 1,000 mg by mouth See admin instructions. Take 2 tablets (1000 mg) by mouth in the morning on dialysis days - Monday, Wednesday, Friday and sometimes Saturday   glycopyrrolate 1 MG tablet Commonly known as: ROBINUL Take 1 tablet (1 mg total) by mouth every 4 (four) hours as needed (excessive secretions).   HYDROmorphone 2 MG tablet Commonly known as: Dilaudid Take 1 tablet (2 mg total) by mouth every 4 (four) hours as needed for severe pain (or air hunger).   ondansetron 4 MG disintegrating tablet Commonly known as: ZOFRAN-ODT Take 1 tablet (4 mg total) by mouth every 6 (six) hours as needed for nausea.       Allergies  Allergen Reactions   Iodinated Contrast Media Hives and Other (See Comments)    Allergy is NOT to all contrast - but patient is unsure of which particular one his allergy is to. Contrast dye used before liver transplant cause hives, not sure which dye this was but is able to use others   Jardiance [Empagliflozin] Dermatitis    developed  blisters with the medication, blisters  stopped after he discontinued taking it. (About 6 months ago /~06/2020)   Metformin And Related Nausea And Vomiting   Other Nausea And Vomiting    Patient states that he is allergic to "some antibiotic" but is not sure of the name of it.   Tape Other (See Comments)    Tore skin   Rosiglitazone Nausea And Vomiting    GI effects and abdominal pain      The results of significant diagnostics from this hospitalization (including imaging, microbiology, ancillary and laboratory) are listed below for reference.    Significant Diagnostic Studies: DG CHEST PORT 1 VIEW  Result Date: 09/23/2021 CLINICAL DATA:  [id number]; shortness of breath, congestive heart failure and end-stage renal disease EXAM: PORTABLE CHEST 1 VIEW COMPARISON:  Chest x-ray from yesterday FINDINGS: There has been interval extubation and removal of the NG tube. Again seen is the right transjugular tunneled catheter with its tip at the caval atrial junction. Cardiomediastinal silhouette is enlarged, stable. Low lung volume. Unchanged pulmonary vascular congestion. Atelectasis at the left lung base without significant interval change. Likely tiny effusion at  the right side. IMPRESSION: There has been interval extubation and removal of the NG tube. Unchanged cardiomegaly and pulmonary vascular congestion. Left basilar atelectasis. Electronically Signed   By: Frazier Richards M.D.   On: 09/23/2021 08:50   IR Radiologist Eval & Mgmt  Result Date: 09/22/2021 INDICATION: Patient with cirrhosis, recurrent ascites s/p abdominal PleurX catheter placement 08/27/21. Request made for catheter assessment, fluid removal. EXAM: IR EVAL AND MANAGEMENT MEDICATIONS: None. COMPLICATIONS: None immediate. PROCEDURE: A timeout was performed prior to the initiation of the procedure. PleurX catheter was sterilely prepped and draped. A catheter access kit was used to connect the Plerux with a drainage bottle. Paracentesis/fluid  removal was performed with removal of 2.4 liters of cloudy, yellow fluid. Access was disconnected and catheter redressed with gauze and Tegaderm. The patient tolerated the procedure well without immediate post procedural complication. FINDINGS: A total of approximately 2.4 liters of cloudy, yellow fluid was removed. IMPRESSION: Successful access of PleurX catheter yielding 2.4 liters of peritoneal fluid. Read by: Brynda Greathouse PA-C Electronically Signed   By: Corrie Mckusick D.O.   On: 09/22/2021 11:39   DG Chest Port 1 View  Result Date: 09/22/2021 CLINICAL DATA:  Respiratory failure. EXAM: PORTABLE CHEST 1 VIEW COMPARISON:  Chest x-ray from yesterday. FINDINGS: Enteric tube entering the stomach with the tip below the field of view. Unchanged endotracheal tube and tunneled right internal jugular dialysis catheter. Unchanged mild cardiomegaly and pulmonary vascular congestion. Unchanged mild left basilar atelectasis. No pleural effusion or pneumothorax. No acute osseous abnormality. IMPRESSION: 1. Stable lines and tubes. 2. Unchanged mild cardiomegaly and pulmonary vascular congestion. Electronically Signed   By: Titus Dubin M.D.   On: 09/22/2021 08:23   ECHOCARDIOGRAM COMPLETE  Result Date: 09/21/2021    ECHOCARDIOGRAM REPORT   Patient Name:   DENARD TUMINELLO Date of Exam: 09/21/2021 Medical Rec #:  [id number]        Height:       70.0 in Accession #:    [id number]       Weight:       198.9 lb Date of Birth:  1955/12/11       BSA:          2.082 m Patient Age:    8 years         BP:           106/50 mmHg Patient Gender: M                HR:           77 bpm. Exam Location:  Inpatient Procedure: 2D Echo and Strain Analysis Indications:    acute systolic chf  History:        Patient has prior history of Echocardiogram examinations, most                 recent 12/14/2020. End stage renal disease, Arrythmias:Atrial                 Fibrillation and V-Tach; Risk Factors:Diabetes and Hypertension.  Sonographer:     Johny Chess RDCS Referring Phys: [place]  1. Left ventricular ejection fraction, by estimation, is 55 to 60%. The left ventricle has normal function. The left ventricle has no regional wall motion abnormalities. Left ventricular diastolic parameters are consistent with Grade II diastolic dysfunction (pseudonormalization). Elevated left atrial pressure. There is the interventricular septum is flattened in systole, consistent with right ventricular pressure overload. The average left ventricular global longitudinal  strain is -18.9 %. The global longitudinal strain is normal.  2. Right ventricular systolic function is normal. The right ventricular size is mildly enlarged. There is moderately elevated pulmonary artery systolic pressure. The estimated right ventricular systolic pressure is 85.0 mmHg.  3. Left atrial size was moderately dilated.  4. Right atrial size was moderately dilated.  5. The mitral valve is normal in structure. Mild mitral valve regurgitation.  6. Tricuspid valve regurgitation is mild to moderate.  7. The aortic valve is tricuspid. There is mild thickening of the aortic valve. Aortic valve regurgitation is not visualized. Aortic valve sclerosis is present, with no evidence of aortic valve stenosis.  8. Aortic dilatation noted. There is mild dilatation of the aortic root, measuring 42 mm.  9. The inferior vena cava is dilated in size with <50% respiratory variability, suggesting right atrial pressure of 15 mmHg. Comparison(s): No significant change from prior study. Prior images reviewed side by side. FINDINGS  Left Ventricle: Left ventricular ejection fraction, by estimation, is 55 to 60%. The left ventricle has normal function. The left ventricle has no regional wall motion abnormalities. The average left ventricular global longitudinal strain is -18.9 %. The global longitudinal strain is normal. The left ventricular internal cavity size was normal in size.  There is borderline concentric left ventricular hypertrophy. The interventricular septum is flattened in systole, consistent with right ventricular pressure overload. Left ventricular diastolic parameters are consistent with Grade II diastolic dysfunction (pseudonormalization). Elevated left atrial pressure. Right Ventricle: The right ventricular size is mildly enlarged. No increase in right ventricular wall thickness. Right ventricular systolic function is normal. There is moderately elevated pulmonary artery systolic pressure. The tricuspid regurgitant velocity is 3.19 m/s, and with an assumed right atrial pressure of 15 mmHg, the estimated right ventricular systolic pressure is 27.7 mmHg. Left Atrium: Left atrial size was moderately dilated. Right Atrium: Right atrial size was moderately dilated. Pericardium: There is no evidence of pericardial effusion. Mitral Valve: The mitral valve is normal in structure. Mild mitral annular calcification. Mild mitral valve regurgitation. Tricuspid Valve: The tricuspid valve is normal in structure. Tricuspid valve regurgitation is mild to moderate. Aortic Valve: The aortic valve is tricuspid. There is mild thickening of the aortic valve. Aortic valve regurgitation is not visualized. Aortic valve sclerosis is present, with no evidence of aortic valve stenosis. Pulmonic Valve: The pulmonic valve was grossly normal. Pulmonic valve regurgitation is not visualized. Aorta: Aortic dilatation noted. There is mild dilatation of the aortic root, measuring 42 mm. Venous: The inferior vena cava is dilated in size with less than 50% respiratory variability, suggesting right atrial pressure of 15 mmHg. IAS/Shunts: No atrial level shunt detected by color flow Doppler.  LEFT VENTRICLE PLAX 2D LVIDd:         5.50 cm   Diastology LVIDs:         4.00 cm   LV e' medial:    8.27 cm/s LV PW:         1.10 cm   LV E/e' medial:  15.2 LV IVS:        1.30 cm   LV e' lateral:   7.00 cm/s LVOT diam:      2.20 cm   LV E/e' lateral: 18.0 LV SV:         127 LV SV Index:   61        2D Longitudinal Strain LVOT Area:     3.80 cm  2D Strain GLS Avg:     -  18.9 %  RIGHT VENTRICLE             IVC RV S prime:     17.10 cm/s  IVC diam: 2.70 cm TAPSE (M-mode): 2.2 cm LEFT ATRIUM             Index        RIGHT ATRIUM           Index LA diam:        4.10 cm 1.97 cm/m   RA Area:     25.60 cm LA Vol (A2C):   79.3 ml 38.08 ml/m  RA Volume:   78.40 ml  37.65 ml/m LA Vol (A4C):   87.7 ml 42.12 ml/m LA Biplane Vol: 85.9 ml 41.25 ml/m  AORTIC VALVE LVOT Vmax:   188.00 cm/s LVOT Vmean:  116.000 cm/s LVOT VTI:    0.333 m  AORTA Ao Root diam: 4.20 cm Ao Asc diam:  3.50 cm MITRAL VALVE                TRICUSPID VALVE MV Area (PHT): 2.22 cm     TR Peak grad:   40.7 mmHg MV Decel Time: 342 msec     TR Vmax:        319.00 cm/s MV E velocity: 126.00 cm/s MV A velocity: 132.00 cm/s  SHUNTS MV E/A ratio:  0.95         Systemic VTI:  0.33 m                             Systemic Diam: 2.20 cm Dani Gobble Croitoru MD Electronically signed by Sanda Klein MD Signature Date/Time: 09/21/2021/10:55:39 AM    Final    DG Abd 1 View  Result Date: 09/21/2021 CLINICAL DATA:  OG tube placement. EXAM: ABDOMEN - 1 VIEW COMPARISON:  None Available. FINDINGS: Tip of the enteric tube is below the diaphragm in the stomach, the side port is in the region of the distal esophagus. Advancement of 4 cm is recommended for optimal placement. Nonobstructive bowel gas pattern in the upper abdomen. IMPRESSION: Tip of the enteric tube below the diaphragm in the stomach, side-port in the region of the distal esophagus. Advancement of 4 cm is recommended for optimal placement. Electronically Signed   By: Keith Rake M.D.   On: 09/21/2021 00:52   DG CHEST PORT 1 VIEW  Result Date: 09/20/2021 CLINICAL DATA:  Respiratory failure EXAM: PORTABLE CHEST 1 VIEW COMPARISON:  None Available. FINDINGS: Endotracheal tube 6.2 cm above the carina. Right internal jugular  hemodialysis catheter tip seen within the superior cavoatrial junction. Cardiac size is enlarged, unchanged. Central pulmonary vascular engorgement is again seen in keeping with changes of pulmonary arterial hypertension. No superimposed overt pulmonary edema. No pneumothorax or pleural effusion. No acute bone abnormality. IMPRESSION: 1. Support lines and tubes in appropriate position. 2. Stable cardiomegaly. 3. Pulmonary arterial hypertension. Electronically Signed   By: Fidela Salisbury M.D.   On: 09/20/2021 21:59   DG Chest Port 1 View  Result Date: 09/20/2021 CLINICAL DATA:  Shortness of breath.  Tachycardia. EXAM: PORTABLE CHEST 1 VIEW COMPARISON:  Radiograph earlier today CT 623 FINDINGS: Right-sided dialysis catheter in place. Volumes are low. Stable cardiomegaly. Improvement in vascular congestion from earlier today. Eventration of the posterior right hemidiaphragm. No pneumothorax or large pleural effusion. No developing airspace disease. New rounded densities projecting over the supraclavicular regions and right axilla likely represent ice packs. IMPRESSION: 1. Low  lung volumes without acute abnormality. Stable cardiomegaly. 2. Right-sided dialysis catheter in place. Electronically Signed   By: Keith Rake M.D.   On: 09/20/2021 19:48   IR PATIENT EVAL TECH 0-60 MINS  Result Date: 09/20/2021 Betsey Holiday     09/22/2021 11:55 AM INDICATION: Patient with cirrhosis, recurrent ascites s/p abdominal PleurX catheter placement 08/27/21. Request made for catheter assessment, fluid removal.  EXAM: IR EVAL AND MANAGEMENT  MEDICATIONS: None.  COMPLICATIONS: None immediate.  PROCEDURE: A timeout was performed prior to the initiation of the procedure.  PleurX catheter was sterilely prepped and draped. A catheter access kit was used to connect the Plerux with a drainage bottle. Paracentesis/fluid removal was performed with removal of 2.4 liters of cloudy, yellow fluid. Access was disconnected and catheter  redressed with gauze and Tegaderm.  The patient tolerated the procedure well without immediate post procedural complication.  FINDINGS: A total of approximately 2.4 liters of cloudy, yellow fluid was removed.  IMPRESSION: Successful access of PleurX catheter yielding 2.4 liters of peritoneal fluid.  Read by: Brynda Greathouse PA-C   Electronically Signed   By: Corrie Mckusick D.O.   On: 09/22/2021 11:39  DG Chest Portable 1 View  Result Date: 09/20/2021 CLINICAL DATA:  Dyspnea EXAM: PORTABLE CHEST 1 VIEW COMPARISON:  08/19/2021 FINDINGS: Right IJ approach central venous catheter remains in place. Stable cardiomegaly. Pulmonary vascular congestion. No overt edema. No focal airspace consolidation, pleural effusion, or pneumothorax. IMPRESSION: Cardiomegaly and pulmonary vascular congestion without overt edema. Electronically Signed   By: Davina Poke D.O.   On: 09/20/2021 09:48   VAS US DUPLEX DIALYSIS ACCESS (AVF,AVG)  Result Date: 08/31/2021 DIALYSIS ACCESS Patient Name:  Yaziel GRAY DOERING  Date of Exam:   08/31/2021 Medical Rec #: [id number]         Accession #:    [id number] Date of Birth: 13-Jan-1956        Patient Gender: M Patient Age:   20 years Exam Location:  [facility] Procedure:      VAS US DUPLEX DIALYSIS ACCESS (AVF, AVG) Referring Phys: Monica Martinez --------------------------------------------------------------------------------  Reason for Exam: Routine follow up. Access Site: Left Upper Extremity. Access Type: Radial-cephalic AVF. History: 07/16/2021: Left radiocephalic AV fistula creation. Comparison Study: None Performing Technologist: Ivan Croft  Examination Guidelines: A complete evaluation includes B-mode imaging, spectral Doppler, color Doppler, and power Doppler as needed of all accessible portions of each vessel. Unilateral testing is considered an integral part of a complete examination. Limited examinations for reoccurring indications may be performed as noted.   Findings: +--------------------+----------+-----------------+--------+ AVF                 PSV (cm/s)Flow Vol (mL/min)Comments +--------------------+----------+-----------------+--------+ Native artery inflow   165           565                +--------------------+----------+-----------------+--------+ AVF Anastomosis        461                              +--------------------+----------+-----------------+--------+  +------------+----------+-------------+----------+----------------+ OUTFLOW VEINPSV (cm/s)Diameter (cm)Depth (cm)    Describe     +------------+----------+-------------+----------+----------------+ AC Fossa        67        0.76        0.11   competing branch +------------+----------+-------------+----------+----------------+ Prox Forearm   132        0.57  0.21   competing branch +------------+----------+-------------+----------+----------------+ Mid Forearm    124        0.52        0.12                    +------------+----------+-------------+----------+----------------+ Dist Forearm   404        0.56        0.13   competing branch +------------+----------+-------------+----------+----------------+   Summary: Patent left arteriovenous fistula. Arteriovenous fistula-Velocities less than 100cm/s noted. *See table(s) above for measurements and observations.  Diagnosing physician: Monica Martinez MD Electronically signed by Monica Martinez MD on 08/31/2021 at 2:35:49 PM.    --------------------------------------------------------------------------------   Final     Microbiology: Recent Results (from the past 240 hour(s))  Blood culture (routine x 2)     Status: None   Collection Time: 09/20/21  9:51 AM   Specimen: BLOOD  Result Value Ref Range Status   Specimen Description BLOOD SITE NOT SPECIFIED  Final   Special Requests   Final    BOTTLES DRAWN AEROBIC AND ANAEROBIC Blood Culture results may not be optimal due to an inadequate volume  of blood received in culture bottles   Culture   Final    NO GROWTH 5 DAYS Performed at [facility], [place] [address], [place], [place] [address]    Report Status 09/25/2021 FINAL  Final  Body fluid culture w Gram Stain     Status: None   Collection Time: 09/21/21 12:19 PM   Specimen: Peritoneal Washings; Body Fluid  Result Value Ref Range Status   Specimen Description PERITONEAL  Final   Special Requests ABDOMEN  Final   Gram Stain   Final    WBC PRESENT, PREDOMINANTLY MONONUCLEAR NO ORGANISMS SEEN CYTOSPIN SMEAR    Culture   Final    NO GROWTH 3 DAYS Performed at [facility], [address], [place], [place] [address]    Report Status 09/24/2021 FINAL  Final  MRSA Next Gen by PCR, Nasal     Status: None   Collection Time: 09/24/21  2:31 PM   Specimen: Nasal Mucosa; Nasal Swab  Result Value Ref Range Status   MRSA by PCR Next Gen NOT DETECTED NOT DETECTED Final    Comment: (NOTE) The GeneXpert MRSA Assay (FDA approved for NASAL specimens only), is one component of a comprehensive MRSA colonization surveillance program. It is not intended to diagnose MRSA infection nor to guide or monitor treatment for MRSA infections. Test performance is not FDA approved in patients less than 5 years old. Performed at [facility], [place] [address], [place], [place] [address]      Labs: Basic Metabolic Panel: Recent Labs  Lab 09/21/21 0510 09/21/21 1750 09/22/21 0411 09/23/21 0818 09/24/21 0423 09/25/21 0414  NA 126* 132* 133* 128* 125* 128*  K 5.9* 4.8 4.4 4.8 4.6 4.0  CL 93* 96* 97* 97* 94* 94*  CO2 17* 21* 21* 21* 20* 23  GLUCOSE 141* 107* 125* 135* 102* 89  BUN 49* 31* 25* 29* 41* 29*  CREATININE 4.89* 3.54* 3.08* 3.67* 4.17* 3.32*  CALCIUM 8.4* 8.2* 8.1* 7.6* 8.0* 8.1*  MG 2.0  --  2.4  --  2.2 1.8  PHOS 6.5*  6.6* 4.7* 4.6 5.8* 6.6* 4.9*   Liver Function Tests: Recent Labs  Lab 09/21/21 0510 09/21/21 1750 09/22/21 0411 09/23/21 0818  AST  --    --  25  --   ALT  --   --  28  --   ALKPHOS  --   --  106  --   BILITOT  --   --  1.1  --   PROT  --   --  4.8*  --   ALBUMIN 2.8* 2.8* 2.8* 2.6*   No results for input(s): "LIPASE", "AMYLASE" in the last 168 hours. No results for input(s): "AMMONIA" in the last 168 hours. CBC: Recent Labs  Lab 09/22/21 0411 09/23/21 0457 09/24/21 0423 09/25/21 0414 09/26/21 0411  WBC 13.0* 10.7* 11.8* 8.9 8.7  HGB 13.4 11.0* 10.5* 9.9* 9.9*  HCT 40.5 34.3* 32.8* 30.6* 30.0*  MCV 93.3 97.4 96.8 96.5 96.2  PLT 137* 117* 112* 114* 125*   Cardiac Enzymes: No results for input(s): "CKTOTAL", "CKMB", "CKMBINDEX", "TROPONINI" in the last 168 hours. BNP: BNP (last 3 results) Recent Labs    06/01/21 0217 08/19/21 0226 09/20/21 0912  BNP 709.6* 1,348.5* 1,376.7*    ProBNP (last 3 results) No results for input(s): "PROBNP" in the last 8760 hours.  CBG: Recent Labs  Lab 09/25/21 1214 09/25/21 1611 09/25/21 2240 09/26/21 0610 09/26/21 1140  GLUCAP 143* 157* 140* 107* 141*       Signed:  Domenic Polite MD.  [facility] 09/27/2021, 12:19 PM

## 2021-09-27 NOTE — Progress Notes (Signed)
PROGRESS NOTE    Gregory Tanner  HYI:[id number] DOB: [birth year] DOA: 09/20/2021 PCP: Ronita Hipps, MD  66 year old male with history of hypertension, chronic diastolic CHF, severe right heart failure, pulmonary hypertension, severe TR, recurrent ascites chronic pain syndrome, ESRD on dialysis on Monday, Wednesday, Friday, diabetes type 2, NASH cirrhosis with hepatocellular carcinoma status post liver transplant presented  To [facility] ED 8/7 from hemodialysis with hypotension and tachycardia, in the ED he was started on Amio gtt., became more diaphoretic and extremis with severe tachycardia, admitted by [facility] and urgently intubated, Levophed -Started CRRT 8/7 -8/13: Goals of care discussions, pressors discontinued, now comfort care -Transferred to [facility] service 8/14  Subjective: -Restless overnight, vitals more stable  Assessment and Plan:  Cardiogenic shock Atrial flutter Severe right heart failure, severe pulmonary hypertension Severe TR Recurrent ascites -Given prognosis of less than 6 months by advanced heart failure team at [facility] 4-5 months ago -Required mechanical ventilation and increasing pressor support in ICU -After palliative care and goals of care discussions with critical care team in ICU he was transitioned to comfort care, taken off pressors -Hemodialysis discontinued -Discussed with spouse at bedside, TOC to discuss residential hospice options  ESRD on hemodialysis -Required CRRT initially, now comfort care, HD discontinued, nephrology has signed off   Hyponatremia:    Liver cirrhosis/history of hepatocellular carcinoma/ Refractory ascites:  -Now with a peritoneal catheter  Diabetes type 2:    Goals of care: Now comfort care  Family Communication: Discussed with spouse at bedside Disposition Plan: Anticipate need for residential hospice  Consultants: PCCM, palliative   Procedures:   Antimicrobials:    Objective: Vitals:   09/26/21 2052 09/26/21 2216  09/27/21 0634 09/27/21 0755  BP: 110/64 102/62 (!) 106/57 (!) 101/55  Pulse:  88 92 93  Resp: (!) '27 17 17 18  '$ Temp:  98.2 F (36.8 C) 98.2 F (36.8 C) 97.8 F (36.6 C)  TempSrc:  Oral  Axillary  SpO2:  95% 95% 93%  Weight:      Height:        Intake/Output Summary (Last 24 hours) at 09/27/2021 1059 Last data filed at 09/27/2021 0600 Gross per 24 hour  Intake 432.51 ml  Output 625 ml  Net -192.49 ml   Filed Weights   09/24/21 1313 09/25/21 0500 09/26/21 0600  Weight: 86 kg 87.2 kg 87.8 kg    Examination:  General exam: Chronically ill male appears older than stated age, somnolent but arousable, mildly restless, oriented to self CVS: S1-S2, tachycardic Lungs: Poor air movement bilaterally Abdomen: Soft, nontender, peritoneal catheter noted Extremities: 2+ edema    Data Reviewed:   CBC: Recent Labs  Lab 09/22/21 0411 09/23/21 0457 09/24/21 0423 09/25/21 0414 09/26/21 0411  WBC 13.0* 10.7* 11.8* 8.9 8.7  HGB 13.4 11.0* 10.5* 9.9* 9.9*  HCT 40.5 34.3* 32.8* 30.6* 30.0*  MCV 93.3 97.4 96.8 96.5 96.2  PLT 137* 117* 112* 114* 786*   Basic Metabolic Panel: Recent Labs  Lab 09/21/21 0510 09/21/21 1750 09/22/21 0411 09/23/21 0818 09/24/21 0423 09/25/21 0414  NA 126* 132* 133* 128* 125* 128*  K 5.9* 4.8 4.4 4.8 4.6 4.0  CL 93* 96* 97* 97* 94* 94*  CO2 17* 21* 21* 21* 20* 23  GLUCOSE 141* 107* 125* 135* 102* 89  BUN 49* 31* 25* 29* 41* 29*  CREATININE 4.89* 3.54* 3.08* 3.67* 4.17* 3.32*  CALCIUM 8.4* 8.2* 8.1* 7.6* 8.0* 8.1*  MG 2.0  --  2.4  --  2.2 1.8  PHOS 6.5*  6.6* 4.7* 4.6 5.8* 6.6* 4.9*   GFR: Estimated Creatinine Clearance: 24.1 mL/min (A) (by C-G formula based on SCr of 3.32 mg/dL (H)). Liver Function Tests: Recent Labs  Lab 09/21/21 0510 09/21/21 1750 09/22/21 0411 09/23/21 0818  AST  --   --  25  --   ALT  --   --  28  --   ALKPHOS  --   --  106  --   BILITOT  --   --  1.1  --   PROT  --   --  4.8*  --   ALBUMIN 2.8* 2.8* 2.8* 2.6*    No results for input(s): "LIPASE", "AMYLASE" in the last 168 hours. No results for input(s): "AMMONIA" in the last 168 hours. Coagulation Profile: Recent Labs  Lab 09/20/21 2310  INR 1.2   Cardiac Enzymes: No results for input(s): "CKTOTAL", "CKMB", "CKMBINDEX", "TROPONINI" in the last 168 hours. BNP (last 3 results) No results for input(s): "PROBNP" in the last 8760 hours. HbA1C: No results for input(s): "HGBA1C" in the last 72 hours. CBG: Recent Labs  Lab 09/25/21 1214 09/25/21 1611 09/25/21 2240 09/26/21 0610 09/26/21 1140  GLUCAP 143* 157* 140* 107* 141*   Lipid Profile: No results for input(s): "CHOL", "HDL", "LDLCALC", "TRIG", "CHOLHDL", "LDLDIRECT" in the last 72 hours. Thyroid Function Tests: No results for input(s): "TSH", "T4TOTAL", "FREET4", "T3FREE", "THYROIDAB" in the last 72 hours. Anemia Panel: No results for input(s): "VITAMINB12", "FOLATE", "FERRITIN", "TIBC", "IRON", "RETICCTPCT" in the last 72 hours. Urine analysis: No results found for: "COLORURINE", "APPEARANCEUR", "LABSPEC", "PHURINE", "GLUCOSEU", "HGBUR", "BILIRUBINUR", "KETONESUR", "PROTEINUR", "UROBILINOGEN", "NITRITE", "LEUKOCYTESUR" Sepsis Labs: '@LABRCNTIP'$ (procalcitonin:4,lacticidven:4)  ) Recent Results (from the past 240 hour(s))  Blood culture (routine x 2)     Status: None   Collection Time: 09/20/21  9:51 AM   Specimen: BLOOD  Result Value Ref Range Status   Specimen Description BLOOD SITE NOT SPECIFIED  Final   Special Requests   Final    BOTTLES DRAWN AEROBIC AND ANAEROBIC Blood Culture results may not be optimal due to an inadequate volume of blood received in culture bottles   Culture   Final    NO GROWTH 5 DAYS Performed at [facility], [address] [place], [place] [address]    Report Status 09/25/2021 FINAL  Final  Body fluid culture w Gram Stain     Status: None   Collection Time: 09/21/21 12:19 PM   Specimen: Peritoneal Washings; Body Fluid  Result Value Ref  Range Status   Specimen Description PERITONEAL  Final   Special Requests ABDOMEN  Final   Gram Stain   Final    WBC PRESENT, PREDOMINANTLY MONONUCLEAR NO ORGANISMS SEEN CYTOSPIN SMEAR    Culture   Final    NO GROWTH 3 DAYS Performed at [facility], [address] [place], [place] [address]    Report Status 09/24/2021 FINAL  Final  MRSA Next Gen by PCR, Nasal     Status: None   Collection Time: 09/24/21  2:31 PM   Specimen: Nasal Mucosa; Nasal Swab  Result Value Ref Range Status   MRSA by PCR Next Gen NOT DETECTED NOT DETECTED Final    Comment: (NOTE) The GeneXpert MRSA Assay (FDA approved for NASAL specimens only), is one component of a comprehensive MRSA colonization surveillance program. It is not intended to diagnose MRSA infection nor to guide or monitor treatment for MRSA infections. Test performance is not FDA approved in patients less than  23 years old. Performed at [facility], [place] [address], [place], [place] [address]      Radiology Studies: No results found.   Scheduled Meds:  lidocaine  1 patch Transdermal Daily   Continuous Infusions:  sodium chloride Stopped (09/25/21 1756)     LOS: 7 days    Time spent: 35mn    PDomenic Polite MD [facility]   09/27/2021, 10:59 AM

## 2021-10-15 DEATH — deceased

## 2022-06-10 IMAGING — MR MR MRA CHEST W/ OR W/O CM
54 series · 54 of 54 positions shown · non-contrast
Comparison: none

CLINICAL DATA: ?Anomalous pulmonary venous return.

EXAM:
CARDIAC MRI
TECHNIQUE: This study was reported with the cardiac MRI on the same day.
Sunbe Lark

[Series 2: [id number] · axial · 8.0mm · 0.62mm/px · 1 of 30 slices shown]
[im 1/30]
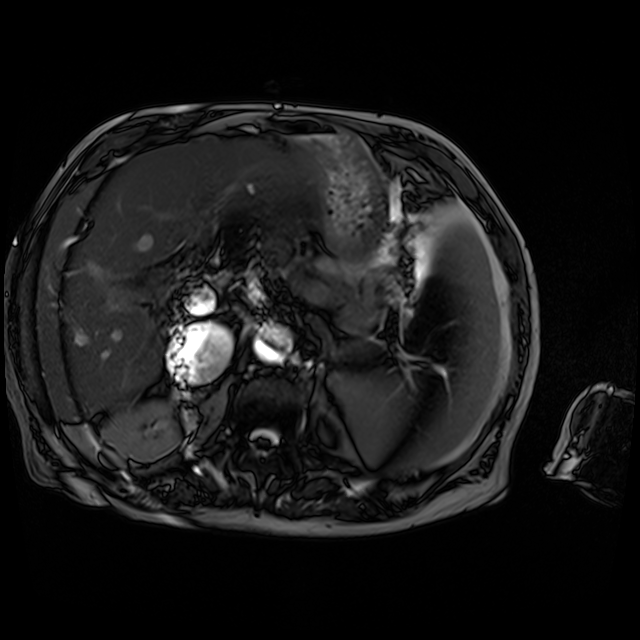

[Series 3: axial_db_haste_loc · axial · 6.0mm · 1.48mm/px · 1 of 36 slices shown]
[im 1/36]
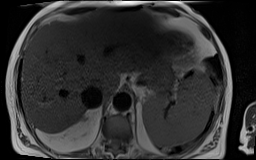

[Series 4: (person_name)_(person_name)_(person_name) · sagittal · 6.0mm · 1.79mm/px · 1 of 25 slices shown (1 of 14)]
[im 1/25]
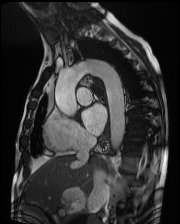

[Series 4: (person_name)_(person_name)_(person_name) · sagittal · 6.0mm · 1.79mm/px · 1 of 25 slices shown (2 of 14)]
[im 1/25]
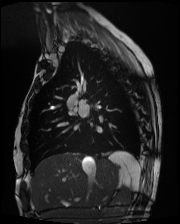

[Series 4: (person_name)_(person_name)_(person_name) · sagittal · 6.0mm · 1.79mm/px · 1 of 25 slices shown (3 of 14)]
[im 1/25]
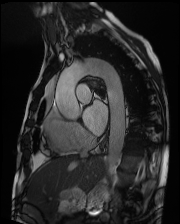

[Series 4: (person_name)_(person_name)_(person_name) · sagittal · 6.0mm · 1.79mm/px · 1 of 25 slices shown (4 of 14)]
[im 1/25]
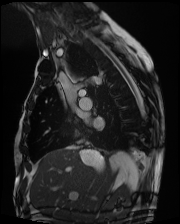

[Series 4: (person_name)_(person_name)_(person_name) · sagittal · 6.0mm · 1.79mm/px · 1 of 25 slices shown (5 of 14)]
[im 1/25]
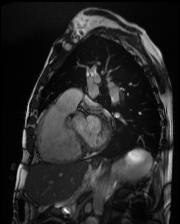

[Series 4: (person_name)_(person_name)_(person_name) · sagittal · 6.0mm · 1.79mm/px · 1 of 25 slices shown (6 of 14)]
[im 1/25]
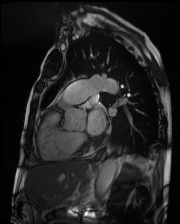

[Series 4: (person_name)_(person_name)_(person_name) · sagittal · 6.0mm · 1.79mm/px · 1 of 25 slices shown (7 of 14)]
[im 1/25]
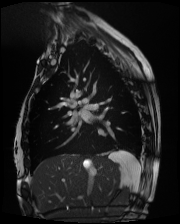

[Series 4: (person_name)_(person_name)_(person_name) · sagittal · 6.0mm · 1.79mm/px · 1 of 25 slices shown (8 of 14)]
[im 1/25]
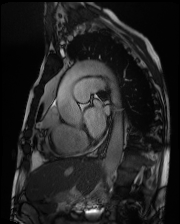

[Series 4: (person_name)_(person_name)_(person_name) · sagittal · 6.0mm · 1.79mm/px · 1 of 25 slices shown (9 of 14)]
[im 1/25]
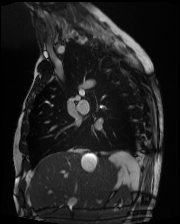

[Series 4: (person_name)_(person_name)_(person_name) · sagittal · 6.0mm · 1.79mm/px · 1 of 25 slices shown (10 of 14)]
[im 1/25]
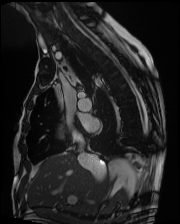

[Series 4: (person_name)_(person_name)_(person_name) · sagittal · 6.0mm · 1.79mm/px · 1 of 25 slices shown (11 of 14)]
[im 1/25]
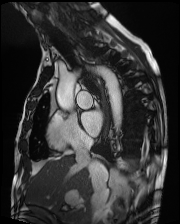

[Series 4: (person_name)_(person_name)_(person_name) · sagittal · 6.0mm · 1.79mm/px · 1 of 25 slices shown (12 of 14)]
[im 1/25]
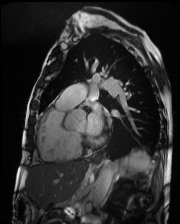

[Series 4: (person_name)_(person_name)_(person_name) · sagittal · 6.0mm · 1.79mm/px · 1 of 25 slices shown (13 of 14)]
[im 1/25]
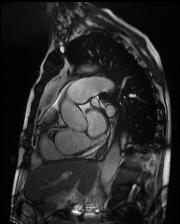

[Series 4: (person_name)_(person_name)_(person_name) · sagittal · 6.0mm · 1.79mm/px · 1 of 25 slices shown (14 of 14)]
[im 1/25]
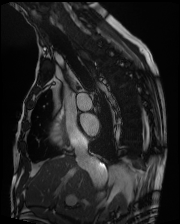

[Series 5: bSSFP · axial · 6.0mm · 1.61mm/px · 1 of 25 slices shown (1 of 38)]
[im 1/25]
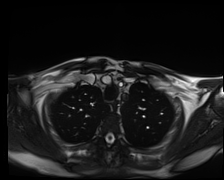

[Series 5: bSSFP · axial · 6.0mm · 1.61mm/px · 1 of 25 slices shown (2 of 38)]
[im 1/25]
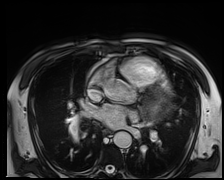

[Series 5: bSSFP · axial · 6.0mm · 1.61mm/px · 1 of 25 slices shown (3 of 38)]
[im 1/25]
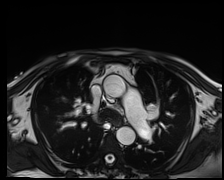

[Series 5: bSSFP · axial · 6.0mm · 1.61mm/px · 1 of 25 slices shown (4 of 38)]
[im 1/25]
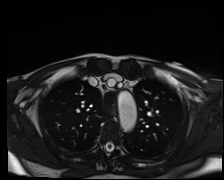

[Series 5: bSSFP · axial · 6.0mm · 1.61mm/px · 1 of 25 slices shown (5 of 38)]
[im 1/25]
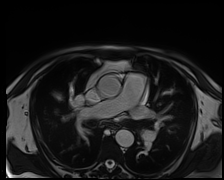

[Series 5: bSSFP · axial · 6.0mm · 1.61mm/px · 1 of 25 slices shown (6 of 38)]
[im 1/25]
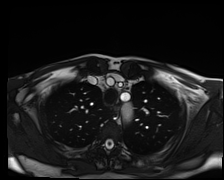

[Series 5: bSSFP · axial · 6.0mm · 1.61mm/px · 1 of 25 slices shown (7 of 38)]
[im 1/25]
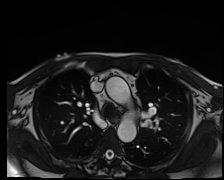

[Series 5: bSSFP · axial · 6.0mm · 1.61mm/px · 1 of 25 slices shown (8 of 38)]
[im 1/25]
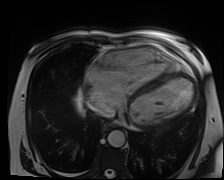

[Series 5: bSSFP · axial · 6.0mm · 1.61mm/px · 1 of 25 slices shown (9 of 38)]
[im 1/25]
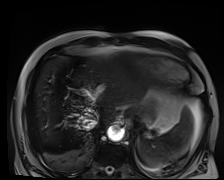

[Series 5: bSSFP · axial · 6.0mm · 1.61mm/px · 1 of 25 slices shown (10 of 38)]
[im 1/25]
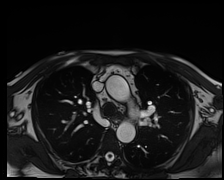

[Series 5: bSSFP · axial · 6.0mm · 1.61mm/px · 1 of 25 slices shown (11 of 38)]
[im 1/25]
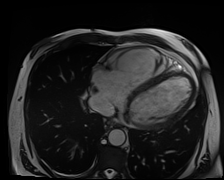

[Series 5: bSSFP · axial · 6.0mm · 1.61mm/px · 1 of 25 slices shown (12 of 38)]
[im 1/25]
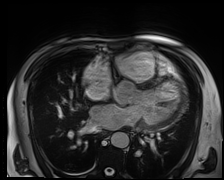

[Series 5: bSSFP · axial · 6.0mm · 1.61mm/px · 1 of 25 slices shown (13 of 38)]
[im 1/25]
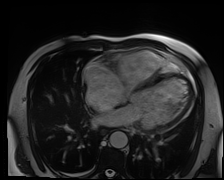

[Series 5: bSSFP · axial · 6.0mm · 1.61mm/px · 1 of 25 slices shown (14 of 38)]
[im 1/25]
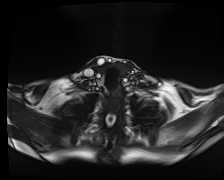

[Series 5: bSSFP · axial · 6.0mm · 1.61mm/px · 1 of 25 slices shown (15 of 38)]
[im 1/25]
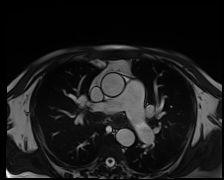

[Series 5: bSSFP · axial · 6.0mm · 1.61mm/px · 1 of 25 slices shown (16 of 38)]
[im 1/25]
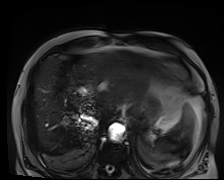

[Series 5: bSSFP · axial · 6.0mm · 1.61mm/px · 1 of 25 slices shown (17 of 38)]
[im 1/25]
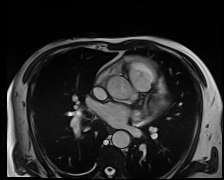

[Series 5: bSSFP · axial · 6.0mm · 1.61mm/px · 1 of 25 slices shown (18 of 38)]
[im 1/25]
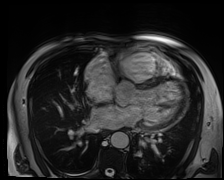

[Series 5: bSSFP · axial · 6.0mm · 1.61mm/px · 1 of 25 slices shown (19 of 38)]
[im 1/25]
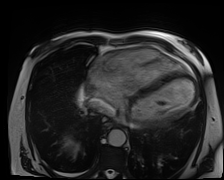

[Series 5: bSSFP · axial · 6.0mm · 1.61mm/px · 1 of 25 slices shown (20 of 38)]
[im 1/25]
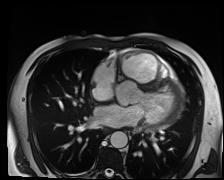

[Series 5: bSSFP · axial · 6.0mm · 1.61mm/px · 1 of 25 slices shown (21 of 38)]
[im 1/25]
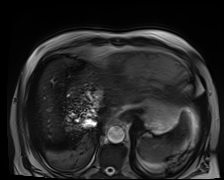

[Series 5: bSSFP · axial · 6.0mm · 1.61mm/px · 1 of 25 slices shown (22 of 38)]
[im 1/25]
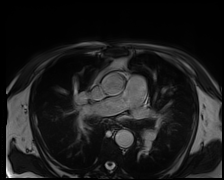

[Series 5: bSSFP · axial · 6.0mm · 1.61mm/px · 1 of 25 slices shown (23 of 38)]
[im 1/25]
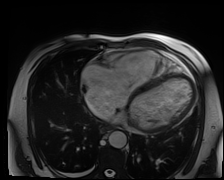

[Series 5: bSSFP · axial · 6.0mm · 1.61mm/px · 1 of 25 slices shown (24 of 38)]
[im 1/25]
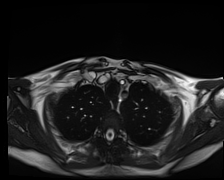

[Series 5: bSSFP · axial · 6.0mm · 1.61mm/px · 1 of 25 slices shown (25 of 38)]
[im 1/25]
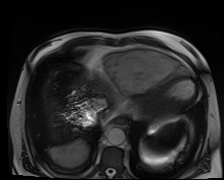

[Series 5: bSSFP · axial · 6.0mm · 1.61mm/px · 1 of 25 slices shown (26 of 38)]
[im 1/25]
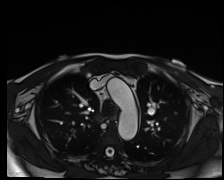

[Series 5: bSSFP · axial · 6.0mm · 1.61mm/px · 1 of 25 slices shown (27 of 38)]
[im 1/25]
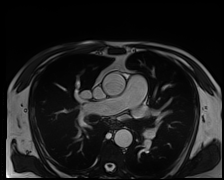

[Series 5: bSSFP · axial · 6.0mm · 1.61mm/px · 1 of 25 slices shown (28 of 38)]
[im 1/25]
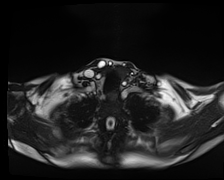

[Series 5: bSSFP · axial · 6.0mm · 1.61mm/px · 1 of 25 slices shown (29 of 38)]
[im 1/25]
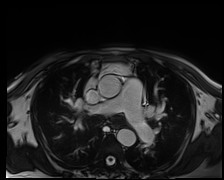

[Series 5: bSSFP · axial · 6.0mm · 1.61mm/px · 1 of 25 slices shown (30 of 38)]
[im 1/25]
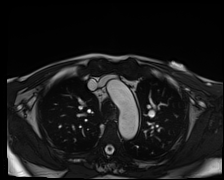

[Series 5: bSSFP · axial · 6.0mm · 1.61mm/px · 1 of 25 slices shown (31 of 38)]
[im 1/25]
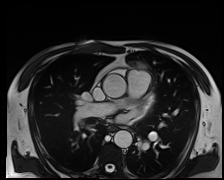

[Series 5: bSSFP · axial · 6.0mm · 1.61mm/px · 1 of 25 slices shown (32 of 38)]
[im 1/25]
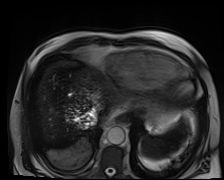

[Series 5: bSSFP · axial · 6.0mm · 1.61mm/px · 1 of 25 slices shown (33 of 38)]
[im 1/25]
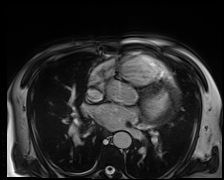

[Series 5: bSSFP · axial · 6.0mm · 1.61mm/px · 1 of 25 slices shown (34 of 38)]
[im 1/25]
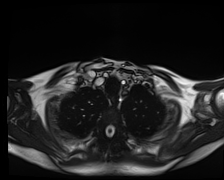

[Series 5: bSSFP · axial · 6.0mm · 1.61mm/px · 1 of 25 slices shown (35 of 38)]
[im 1/25]
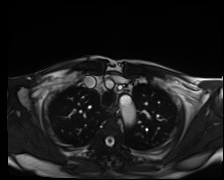

[Series 5: bSSFP · axial · 6.0mm · 1.61mm/px · 1 of 25 slices shown (36 of 38)]
[im 1/25]
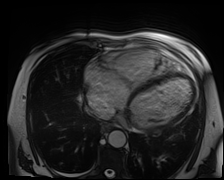

[Series 5: bSSFP · axial · 6.0mm · 1.61mm/px · 1 of 25 slices shown (37 of 38)]
[im 1/25]
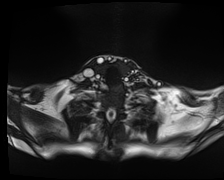

[Series 5: bSSFP · axial · 6.0mm · 1.61mm/px · 1 of 25 slices shown (38 of 38)]
[im 1/25]
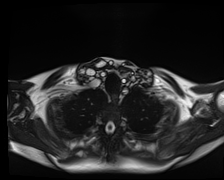

[54 of 54 positions shown; findings below may reference images not displayed]

## 2022-11-13 IMAGING — US IR PARACENTESIS
1 series · 4 of 4 positions shown · non-contrast
Comparison: none

INDICATION: Patient with history of liver transplant and recurrent ascites.
Request for therapeutic paracentesis.

[Series 1: ir (person_name)/(person_name) · 4 acquisitions, 4 frames shown]
[im 1/4]
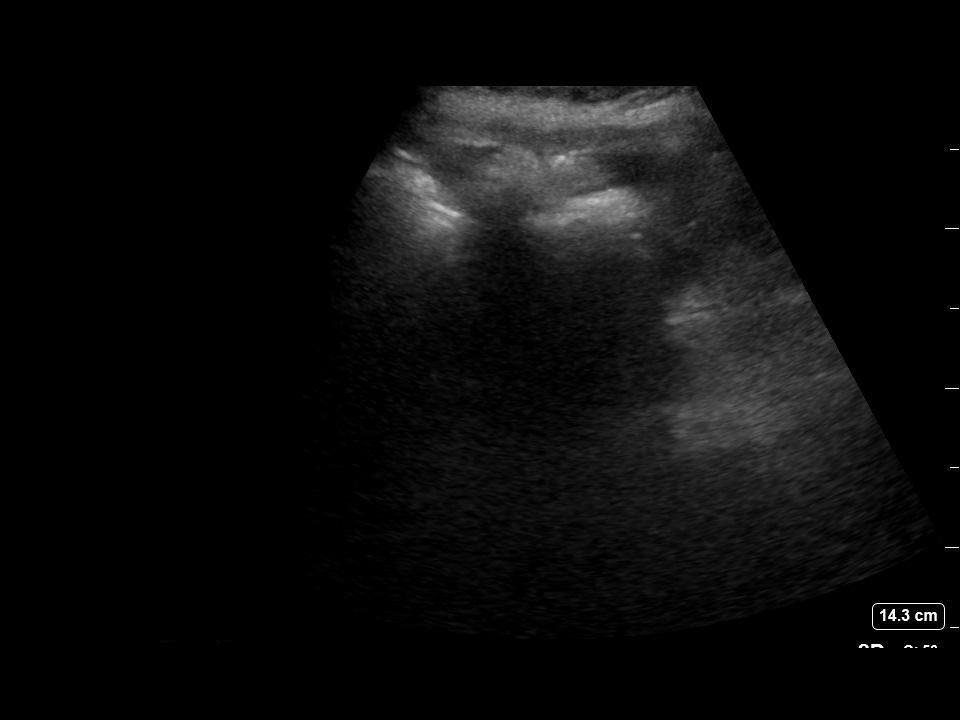
[im 2/4]
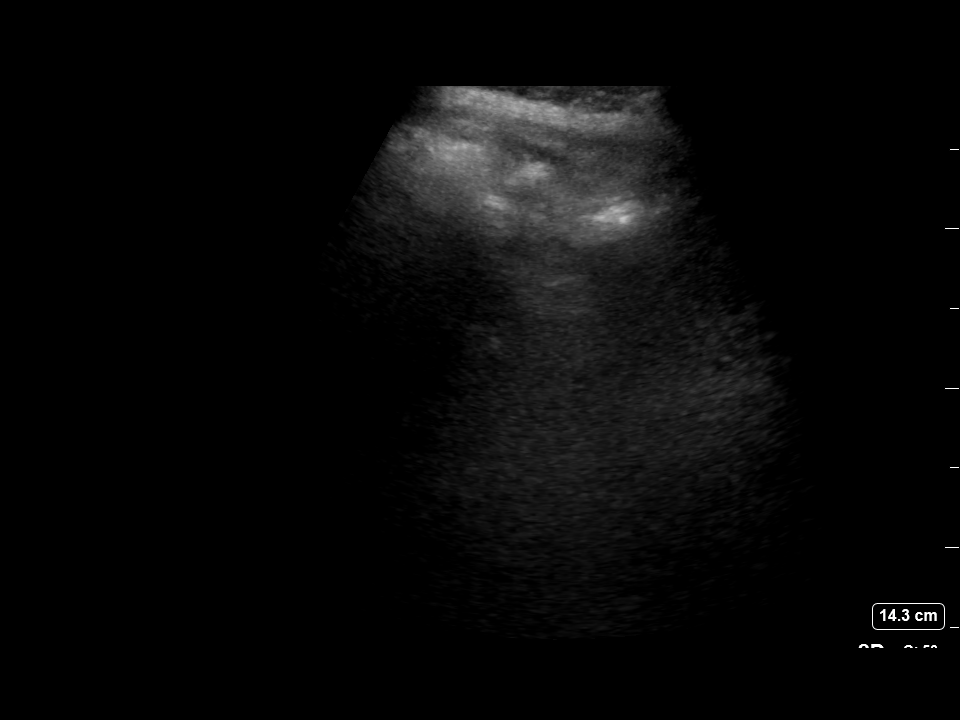
[im 3/4]
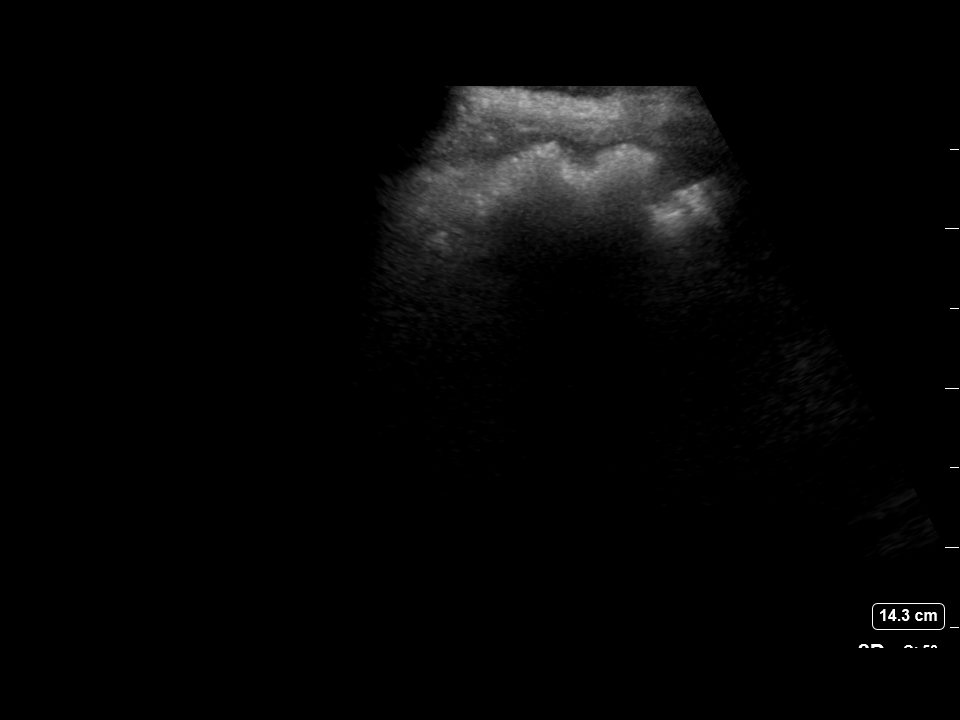
[im 4/4]
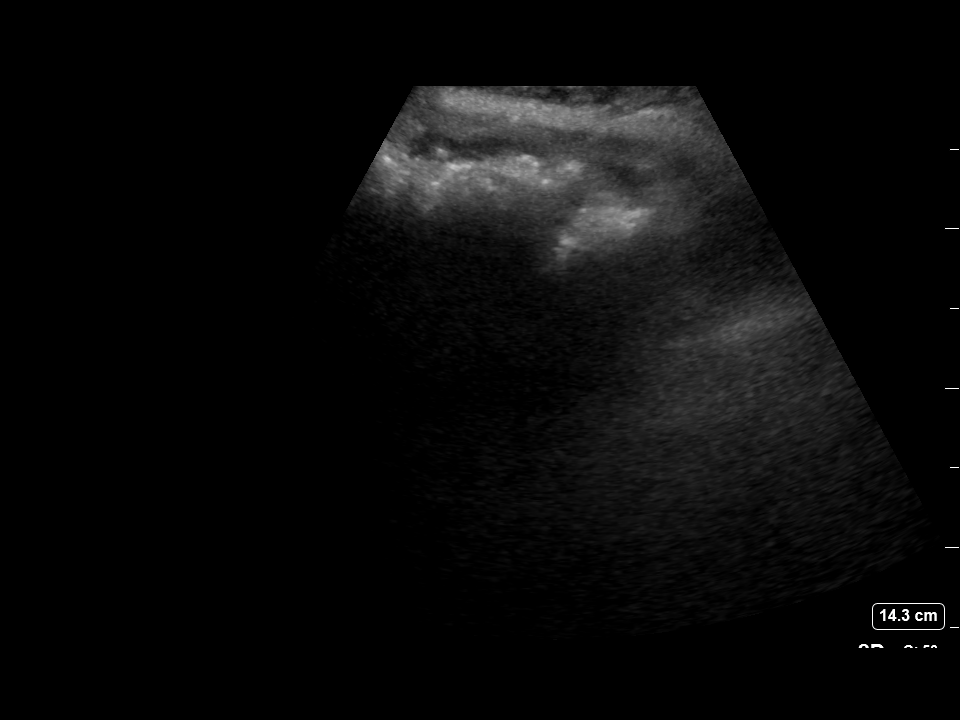

[4 of 4 positions shown; findings below may reference images not displayed]

EXAM:
ULTRASOUND GUIDED THERAPEUTIC RIGHT LOWER QUADRANT PARACENTESIS

MEDICATIONS:
10 mL 1 % lidocaine

COMPLICATIONS:
None immediate.

PROCEDURE:
Informed written consent was obtained from the patient after a
discussion of the risks, benefits and alternatives to treatment. A
timeout was performed prior to the initiation of the procedure.

Initial ultrasound scanning demonstrates a large amount of ascites
within the right lower abdominal quadrant. The right lower abdomen
was prepped and draped in the usual sterile fashion. 1% lidocaine
was used for local anesthesia.

Following this, a 19 gauge, 7-cm, Yueh catheter was introduced. An
ultrasound image was saved for documentation purposes. The
paracentesis was performed. The catheter was removed and a dressing
was applied. The patient tolerated the procedure well without
immediate post procedural complication.
FINDINGS: A total of approximately 3.2 L of clear, rust colored fluid was
removed.
IMPRESSION: Successful ultrasound-guided paracentesis yielding 3.2 liters of
peritoneal fluid.
